# Patient Record
Sex: Female | Born: 1990 | Race: White | Hispanic: No | Marital: Single
Health system: Midwestern US, Academic
[De-identification: ages and names within clinical notes are randomized; demographics above are authoritative.]

## PROBLEM LIST (undated history)

## (undated) DIAGNOSIS — N83209 Unspecified ovarian cyst, unspecified side: Secondary | ICD-10-CM

## (undated) DIAGNOSIS — T4145XA Adverse effect of unspecified anesthetic, initial encounter: Secondary | ICD-10-CM

## (undated) DIAGNOSIS — R251 Tremor, unspecified: Secondary | ICD-10-CM

## (undated) DIAGNOSIS — Z8709 Personal history of other diseases of the respiratory system: Secondary | ICD-10-CM

## (undated) DIAGNOSIS — R200 Anesthesia of skin: Secondary | ICD-10-CM

## (undated) DIAGNOSIS — E8842 MERRF syndrome: Secondary | ICD-10-CM

## (undated) DIAGNOSIS — R569 Unspecified convulsions: Secondary | ICD-10-CM

## (undated) DIAGNOSIS — S060XAA Concussion with loss of consciousness status unknown, initial encounter: Secondary | ICD-10-CM

## (undated) DIAGNOSIS — S43006A Unspecified dislocation of unspecified shoulder joint, initial encounter: Secondary | ICD-10-CM

## (undated) DIAGNOSIS — K5900 Constipation, unspecified: Secondary | ICD-10-CM

## (undated) DIAGNOSIS — F4310 Post-traumatic stress disorder, unspecified: Secondary | ICD-10-CM

## (undated) DIAGNOSIS — Z8679 Personal history of other diseases of the circulatory system: Secondary | ICD-10-CM

## (undated) DIAGNOSIS — Z974 Presence of external hearing-aid: Secondary | ICD-10-CM

## (undated) DIAGNOSIS — R279 Unspecified lack of coordination: Secondary | ICD-10-CM

## (undated) DIAGNOSIS — I499 Cardiac arrhythmia, unspecified: Secondary | ICD-10-CM

## (undated) DIAGNOSIS — G930 Cerebral cysts: Secondary | ICD-10-CM

## (undated) DIAGNOSIS — S60822A Blister (nonthermal) of left wrist, initial encounter: Secondary | ICD-10-CM

## (undated) DIAGNOSIS — G40309 Generalized idiopathic epilepsy and epileptic syndromes, not intractable, without status epilepticus: Secondary | ICD-10-CM

## (undated) DIAGNOSIS — S060X9A Concussion with loss of consciousness of unspecified duration, initial encounter: Secondary | ICD-10-CM

## (undated) DIAGNOSIS — Z9889 Other specified postprocedural states: Secondary | ICD-10-CM

## (undated) DIAGNOSIS — L709 Acne, unspecified: Secondary | ICD-10-CM

## (undated) DIAGNOSIS — S43002A Unspecified subluxation of left shoulder joint, initial encounter: Secondary | ICD-10-CM

## (undated) DIAGNOSIS — D649 Anemia, unspecified: Secondary | ICD-10-CM

## (undated) DIAGNOSIS — R270 Ataxia, unspecified: Secondary | ICD-10-CM

## (undated) DIAGNOSIS — Z789 Other specified health status: Secondary | ICD-10-CM

## (undated) DIAGNOSIS — F509 Eating disorder, unspecified: Secondary | ICD-10-CM

## (undated) DIAGNOSIS — K8020 Calculus of gallbladder without cholecystitis without obstruction: Secondary | ICD-10-CM

## (undated) DIAGNOSIS — J45909 Unspecified asthma, uncomplicated: Secondary | ICD-10-CM

## (undated) DIAGNOSIS — Z8489 Family history of other specified conditions: Secondary | ICD-10-CM

## (undated) DIAGNOSIS — J189 Pneumonia, unspecified organism: Secondary | ICD-10-CM

## (undated) DIAGNOSIS — Z87442 Personal history of urinary calculi: Secondary | ICD-10-CM

## (undated) DIAGNOSIS — Z8669 Personal history of other diseases of the nervous system and sense organs: Secondary | ICD-10-CM

## (undated) DIAGNOSIS — F419 Anxiety disorder, unspecified: Secondary | ICD-10-CM

## (undated) DIAGNOSIS — G5600 Carpal tunnel syndrome, unspecified upper limb: Secondary | ICD-10-CM

## (undated) DIAGNOSIS — T8859XA Other complications of anesthesia, initial encounter: Secondary | ICD-10-CM

## (undated) DIAGNOSIS — M2550 Pain in unspecified joint: Secondary | ICD-10-CM

## (undated) DIAGNOSIS — G43909 Migraine, unspecified, not intractable, without status migrainosus: Secondary | ICD-10-CM

## (undated) DIAGNOSIS — G40209 Localization-related (focal) (partial) symptomatic epilepsy and epileptic syndromes with complex partial seizures, not intractable, without status epilepticus: Secondary | ICD-10-CM

## (undated) DIAGNOSIS — M1990 Unspecified osteoarthritis, unspecified site: Secondary | ICD-10-CM

## (undated) DIAGNOSIS — R112 Nausea with vomiting, unspecified: Secondary | ICD-10-CM

## (undated) DIAGNOSIS — Z973 Presence of spectacles and contact lenses: Secondary | ICD-10-CM

## (undated) DIAGNOSIS — R011 Cardiac murmur, unspecified: Secondary | ICD-10-CM

## (undated) DIAGNOSIS — K219 Gastro-esophageal reflux disease without esophagitis: Secondary | ICD-10-CM

## (undated) DIAGNOSIS — R55 Syncope and collapse: Secondary | ICD-10-CM

## (undated) HISTORY — PX: ABDOMINAL HYSTERECTOMY: SHX81

## (undated) HISTORY — PX: OTHER SURGICAL HISTORY: SHX169

## (undated) HISTORY — DX: Unspecified convulsions: R56.9

## (undated) HISTORY — DX: Anxiety disorder, unspecified: F41.9

## (undated) HISTORY — PX: REPAIR ANKLE LIGAMENT: SUR1187

## (undated) HISTORY — PX: MASTECTOMY: SHX3

## (undated) HISTORY — PX: APPENDECTOMY: SHX54

---
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
## 2009-04-22 ENCOUNTER — Emergency Department (HOSPITAL_BASED_OUTPATIENT_CLINIC_OR_DEPARTMENT_OTHER): Admission: EM | Admit: 2009-04-22 | Discharge: 2009-04-22 | Payer: Self-pay | Admitting: Emergency Medicine

## 2009-08-15 ENCOUNTER — Emergency Department: Payer: Self-pay | Admitting: Emergency Medicine

## 2009-08-16 ENCOUNTER — Telehealth: Payer: Self-pay | Admitting: Internal Medicine

## 2009-08-16 DIAGNOSIS — G40909 Epilepsy, unspecified, not intractable, without status epilepticus: Secondary | ICD-10-CM | POA: Insufficient documentation

## 2009-08-17 ENCOUNTER — Encounter: Payer: Self-pay | Admitting: Internal Medicine

## 2009-08-17 ENCOUNTER — Ambulatory Visit (HOSPITAL_COMMUNITY): Admission: RE | Admit: 2009-08-17 | Discharge: 2009-08-17 | Payer: Self-pay | Admitting: Internal Medicine

## 2009-08-23 ENCOUNTER — Telehealth: Payer: Self-pay | Admitting: Internal Medicine

## 2009-08-27 ENCOUNTER — Encounter: Payer: Self-pay | Admitting: Internal Medicine

## 2009-08-31 ENCOUNTER — Encounter: Payer: Self-pay | Admitting: Internal Medicine

## 2009-09-09 ENCOUNTER — Encounter: Payer: Self-pay | Admitting: Internal Medicine

## 2009-09-11 ENCOUNTER — Emergency Department: Payer: Self-pay | Admitting: Emergency Medicine

## 2009-09-14 ENCOUNTER — Ambulatory Visit: Payer: Self-pay | Admitting: Internal Medicine

## 2009-09-17 LAB — CONVERTED CEMR LAB
Albumin: 4.4 g/dL (ref 3.5–5.2)
Alkaline Phosphatase: 60 units/L (ref 39–117)
BUN: 12 mg/dL (ref 6–23)
Basophils Absolute: 0 10*3/uL (ref 0.0–0.1)
CRP, High Sensitivity: 0 (ref 0.00–5.00)
Creatinine, Ser: 0.7 mg/dL (ref 0.4–1.2)
GFR calc non Af Amer: 114.18 mL/min (ref >60)
Glucose, Bld: 87 mg/dL (ref 70–99)
HCT: 38.5 % (ref 36.0–46.0)
Hemoglobin: 13.3 g/dL (ref 12.0–15.0)
Lymphocytes Relative: 23.6 % (ref 12.0–46.0)
MCHC: 34.4 g/dL (ref 30.0–36.0)
MCV: 86.1 fL (ref 78.0–100.0)
Monocytes Relative: 7.1 % (ref 3.0–12.0)
Platelets: 306 10*3/uL (ref 150.0–400.0)
RBC: 4.47 M/uL (ref 3.87–5.11)
TSH: 1.16 microintl units/mL (ref 0.35–5.50)
Total Protein: 7.1 g/dL (ref 6.0–8.3)

## 2009-09-20 ENCOUNTER — Encounter: Payer: Self-pay | Admitting: Internal Medicine

## 2009-09-20 ENCOUNTER — Encounter (INDEPENDENT_AMBULATORY_CARE_PROVIDER_SITE_OTHER): Payer: Self-pay | Admitting: *Deleted

## 2009-09-21 ENCOUNTER — Telehealth: Payer: Self-pay | Admitting: Internal Medicine

## 2009-10-11 ENCOUNTER — Encounter (INDEPENDENT_AMBULATORY_CARE_PROVIDER_SITE_OTHER): Payer: Self-pay | Admitting: *Deleted

## 2009-11-27 ENCOUNTER — Encounter: Payer: Self-pay | Admitting: Internal Medicine

## 2010-01-15 HISTORY — PX: OVARIAN CYST REMOVAL: SHX89

## 2010-05-28 ENCOUNTER — Ambulatory Visit: Payer: Self-pay | Admitting: Internal Medicine

## 2010-10-13 ENCOUNTER — Emergency Department: Payer: Self-pay | Admitting: Emergency Medicine

## 2010-10-17 ENCOUNTER — Encounter: Payer: Self-pay | Admitting: Obstetrics and Gynecology

## 2010-10-18 ENCOUNTER — Emergency Department (HOSPITAL_COMMUNITY): Admission: EM | Admit: 2010-10-18 | Discharge: 2010-10-18 | Payer: Self-pay | Admitting: Emergency Medicine

## 2010-12-26 ENCOUNTER — Encounter: Payer: Self-pay | Admitting: Internal Medicine

## 2011-01-16 NOTE — Letter (Signed)
Summary: Medical Examination for [facility]  Medical Examination for [facility]   Imported By: Maryln Gottron 05/29/2010 12:51:11  _____________________________________________________________________  External Attachment:    Type:   Image     Comment:   External Document

## 2011-01-16 NOTE — Assessment & Plan Note (Signed)
Summary: cpx, has form/et   Vital Signs:  Patient profile:   20 year old female Menstrual status:  regular LMP:     05/25/2010 Height:      63 inches Weight:      118 pounds BMI:     20.98 Pulse rate:   60 / minute Pulse rhythm:   regular Resp:     12 per minute BP sitting:   108 / 76  (left arm) Cuff size:   regular  Vitals Entered By: Gladis Riffle, RN (May 28, 2010 7:53 AM) CC: cpx. has form Is Patient Diabetic? No LMP (date): 05/25/2010     Menstrual Status regular Enter LMP: 05/25/2010   CC:  cpx. has form.  History of Present Illness: CPX  diarrhea since returning from [place] no abdominal pain  Preventive Screening-Counseling & Management  Alcohol-Tobacco     Smoking Status: never  Current Problems (verified): 1)  Seizure Disorder  (ICD-780.39) 2)  Preventive Health Care  (ICD-V70.0) 3)  Hearing Loss, Bilateral  (ICD-389.9)  Current Medications (verified): 1)  Tegretol 200 Mg Tabs (Carbamazepine) .... 1/2 By Mouth Once Daily 2)  Acetaminophen 500 Mg  Caps (Acetaminophen) 3)  Advil 200 Mg Tabs (Ibuprofen) 4)  D3-50 50000 Unit Caps (Cholecalciferol) .... One Capsule By Mouth Every Week  Allergies: 1)  ! 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 [employer] Never Smoked Alcohol use-no Regular exercise-no long term relationship with female  partner   Impression & Recommendations:  Problem # 1:  PREVENTIVE HEALTH CARE (ICD-V70.0) forms completed health maint utd---does not need PAP may need pelvic exam sometime in mext 5 years  Problem # 2:  HEARING LOSS, BILATERAL (ICD-389.9) chronic no eval necessary  Problem # 3:  DIARRHEA (ICD-787.91)  4 weeks duration travel to [place]  Orders: T-Culture, Stool ([id number]) T-Culture, C-Diff Toxin A/B (09811-91478) T-Stool Giardia / Crypto- EIA (29562) T-Stool for O&P (13086-57846)  Complete Medication List: 1)  Tegretol 200 Mg Tabs (Carbamazepine) .... 1/2 by mouth once daily 2)  Acetaminophen 500 Mg Caps (Acetaminophen) 3)  Advil 200 Mg Tabs (Ibuprofen) 4)  D3-50 50000 Unit Caps (Cholecalciferol) .... One capsule by mouth every week  Contraindications/Deferment of Procedures/Staging:    Test/Procedure: PAP Smear    Reason for deferment: not indicated     Immunization History:  Tetanus/Td Immunization History:    Tetanus/Td:  boostrix (11/14/2009)  Physical Exam General Appearance: well developed, well nourished, no acute distress Eyes: conjunctiva and lids normal, PERRLA, EOMI,  Ears, Nose, Mouth, Throat: TM clear, nares clear, oral exam WNL Neck: supple, no lymphadenopathy, no thyromegaly, no JVD Respiratory: clear to auscultation and percussion, respiratory effort normal Cardiovascular: regular rate and rhythm, S1-S2, no murmur, rub or gallop, no bruits, peripheral pulses normal and symmetric, no cyanosis, clubbing, edema or varicosities Chest: no scars, masses, tenderness; no asymmetry,  Gastrointestinal: soft, non-tender; no hepatosplenomegaly, masses; active bowel sounds all quadrants,  no masses, tenderness,  Lymphatic: no cervical, axillary or inguinal adenopathy Musculoskeletal: gait normal, muscle tone and strength WNL,  no joint swelling, effusions, discoloration, crepitus  Skin: clear, good turgor, color WNL, no rashes, lesions, or  ulcerations Neurologic: normal mental status, normal reflexes, normal strength, sensation, and motion Psychiatric: alert; oriented to person, place and time Other Exam:      Immunization History:  Tetanus/Td Immunization History:    Tetanus/Td:  boostrix (11/14/2009)

## 2011-01-16 NOTE — Letter (Signed)
Summary: [facility]  [facility]   Imported By: Maryln Gottron 01/01/2011 09:28:08  _____________________________________________________________________  External Attachment:    Type:   Image     Comment:   External Document

## 2011-01-16 NOTE — Miscellaneous (Signed)
Summary: [employer]   Imported By: Maryln Gottron 05/30/2010 09:32:27  _____________________________________________________________________  External Attachment:    Type:   Image     Comment:   External Document

## 2011-02-25 LAB — URINALYSIS, ROUTINE W REFLEX MICROSCOPIC
Hgb urine dipstick: NEGATIVE
Ketones, ur: NEGATIVE mg/dL
Nitrite: NEGATIVE
Protein, ur: NEGATIVE mg/dL
Specific Gravity, Urine: 1.025 (ref 1.005–1.030)
Urobilinogen, UA: 0.2 mg/dL (ref 0.0–1.0)
pH: 6 (ref 5.0–8.0)

## 2011-02-25 LAB — WET PREP, GENITAL
Clue Cells Wet Prep HPF POC: NONE SEEN
Trich, Wet Prep: NONE SEEN
Yeast Wet Prep HPF POC: NONE SEEN

## 2011-02-25 LAB — CBC
MCHC: 34 g/dL (ref 30.0–36.0)
Platelets: 283 10*3/uL (ref 150–400)
RBC: 4.1 MIL/uL (ref 3.87–5.11)

## 2011-02-25 LAB — DIFFERENTIAL
Basophils Absolute: 0 10*3/uL (ref 0.0–0.1)
Basophils Relative: 0 % (ref 0–1)
Eosinophils Absolute: 0.4 10*3/uL (ref 0.0–0.7)
Lymphocytes Relative: 47 % — ABNORMAL HIGH (ref 12–46)

## 2011-02-25 LAB — POCT PREGNANCY, URINE: Preg Test, Ur: NEGATIVE

## 2011-04-04 ENCOUNTER — Ambulatory Visit: Payer: BC Managed Care – PPO | Admitting: Physical Therapy

## 2011-04-16 ENCOUNTER — Observation Stay: Payer: Self-pay | Admitting: Internal Medicine

## 2011-04-25 ENCOUNTER — Ambulatory Visit: Payer: BC Managed Care – PPO | Admitting: Internal Medicine

## 2011-04-25 ENCOUNTER — Encounter: Payer: BC Managed Care – PPO | Admitting: Rehabilitative and Restorative Service Providers"

## 2011-05-07 ENCOUNTER — Ambulatory Visit: Payer: BC Managed Care – PPO | Admitting: Internal Medicine

## 2011-05-07 ENCOUNTER — Encounter: Payer: Self-pay | Admitting: Internal Medicine

## 2011-05-08 ENCOUNTER — Ambulatory Visit (INDEPENDENT_AMBULATORY_CARE_PROVIDER_SITE_OTHER): Payer: BC Managed Care – PPO | Admitting: Internal Medicine

## 2011-05-08 ENCOUNTER — Encounter: Payer: Self-pay | Admitting: Internal Medicine

## 2011-05-08 VITALS — BP 94/62 | HR 64 | Temp 98.7°F | Wt 104.0 lb

## 2011-05-08 DIAGNOSIS — R569 Unspecified convulsions: Secondary | ICD-10-CM

## 2011-05-08 NOTE — Progress Notes (Signed)
  Subjective:    Patient ID: [name], female    DOB: 1991/11/27, 20 y.o.   MRN: [id number]  HPI  Hospitalized for seizure--low tegretol level---dose was increased. She has a neurologist--dr. Dohmeier.  Pt currently feels well, no complaints.   Past Medical History  Diagnosis Date  . Seizure disorder 2010  . Hearing loss    No past surgical history on file.  reports that she has never smoked. She does not have any smokeless tobacco history on file. She reports that she does not drink alcohol. Her drug history not on file. family history includes Polymyalgia rheumatica in her mother. Allergies  Allergen Reactions  . Cefuroxime Axetil     REACTION: rash, throat swells shut     Review of Systems  patient denies chest pain, shortness of breath, orthopnea. Denies lower extremity edema, abdominal pain, change in appetite, change in bowel movements. Patient denies rashes, musculoskeletal complaints. No other specific complaints in a complete review of systems.      Objective:   Physical Exam  Well-developed well-nourished female in no acute distress. HEENT exam atraumatic, normocephalic, extraocular muscles are intact. Neck is supple. No jugular venous distention no thyromegaly. Chest clear to auscultation without increased work of breathing. Cardiac exam S1 and S2 are regular. Abdominal exam active bowel sounds, soft, nontender. Extremities no edema. Neurologic exam she is alert without any motor sensory deficits. Gait is normal.        Assessment & Plan:

## 2011-05-08 NOTE — Assessment & Plan Note (Signed)
Recurrence with recent change in meds---no recurrence since then.  There has been some concern about low blood sugars---reasonable to check GGT

## 2011-05-09 LAB — CARBAMAZEPINE LEVEL, TOTAL: Carbamazepine Lvl: 4.2 ug/mL (ref 4.0–12.0)

## 2011-10-10 DIAGNOSIS — M25312 Other instability, left shoulder: Secondary | ICD-10-CM | POA: Insufficient documentation

## 2011-10-10 DIAGNOSIS — G5690 Unspecified mononeuropathy of unspecified upper limb: Secondary | ICD-10-CM | POA: Insufficient documentation

## 2011-11-25 ENCOUNTER — Encounter (HOSPITAL_COMMUNITY): Payer: Self-pay | Admitting: Emergency Medicine

## 2011-11-25 ENCOUNTER — Inpatient Hospital Stay (HOSPITAL_COMMUNITY)
Admission: EM | Admit: 2011-11-25 | Discharge: 2011-11-27 | DRG: 883 | Disposition: A | Discharge status: Home / Self Care | Payer: BC Managed Care – PPO | Source: Ambulatory Visit | Attending: Surgery | Admitting: Surgery

## 2011-11-25 DIAGNOSIS — G40109 Localization-related (focal) (partial) symptomatic epilepsy and epileptic syndromes with simple partial seizures, not intractable, without status epilepticus: Secondary | ICD-10-CM | POA: Diagnosis present

## 2011-11-25 DIAGNOSIS — M6281 Muscle weakness (generalized): Secondary | ICD-10-CM | POA: Diagnosis present

## 2011-11-25 DIAGNOSIS — R209 Unspecified disturbances of skin sensation: Secondary | ICD-10-CM | POA: Diagnosis not present

## 2011-11-25 DIAGNOSIS — K37 Unspecified appendicitis: Secondary | ICD-10-CM

## 2011-11-25 DIAGNOSIS — K353 Acute appendicitis with localized peritonitis, without perforation or gangrene: Secondary | ICD-10-CM | POA: Diagnosis present

## 2011-11-25 DIAGNOSIS — G40909 Epilepsy, unspecified, not intractable, without status epilepticus: Secondary | ICD-10-CM | POA: Diagnosis present

## 2011-11-25 DIAGNOSIS — H9190 Unspecified hearing loss, unspecified ear: Secondary | ICD-10-CM | POA: Diagnosis present

## 2011-11-25 DIAGNOSIS — R079 Chest pain, unspecified: Secondary | ICD-10-CM | POA: Diagnosis not present

## 2011-11-25 DIAGNOSIS — K3533 Acute appendicitis with perforation and localized peritonitis, with abscess: Principal | ICD-10-CM | POA: Diagnosis present

## 2011-11-25 DIAGNOSIS — J45909 Unspecified asthma, uncomplicated: Secondary | ICD-10-CM

## 2011-11-25 HISTORY — DX: Unspecified subluxation of left shoulder joint, initial encounter: S43.002A

## 2011-11-25 LAB — CBC
HCT: 36.8 % (ref 36.0–46.0)
Hemoglobin: 12.9 g/dL (ref 12.0–15.0)
MCH: 31 pg (ref 26.0–34.0)
MCHC: 35.1 g/dL (ref 30.0–36.0)
MCV: 88.5 fL (ref 78.0–100.0)
Platelets: 239 10*3/uL (ref 150–400)
WBC: 10.1 10*3/uL (ref 4.0–10.5)

## 2011-11-25 LAB — URINALYSIS, ROUTINE W REFLEX MICROSCOPIC
Bilirubin Urine: NEGATIVE
Nitrite: NEGATIVE
Urobilinogen, UA: 0.2 mg/dL (ref 0.0–1.0)

## 2011-11-25 LAB — DIFFERENTIAL
Eosinophils Relative: 2 % (ref 0–5)
Lymphs Abs: 0.9 10*3/uL (ref 0.7–4.0)
Monocytes Absolute: 0.5 10*3/uL (ref 0.1–1.0)
Neutro Abs: 8.5 10*3/uL — ABNORMAL HIGH (ref 1.7–7.7)

## 2011-11-25 LAB — POCT I-STAT, CHEM 8
Glucose, Bld: 95 mg/dL (ref 70–99)
TCO2: 30 mmol/L (ref 0–100)

## 2011-11-25 MED ORDER — SODIUM CHLORIDE 0.9 % IV SOLN
Freq: Once | INTRAVENOUS | Status: AC
  Administered 2011-11-25: 23:00:00 via INTRAVENOUS

## 2011-11-25 MED ORDER — HYDROMORPHONE HCL PF 1 MG/ML IJ SOLN
1.0000 mg | Freq: Once | INTRAMUSCULAR | Status: AC
  Administered 2011-11-25: 1 mg via INTRAVENOUS
  Filled 2011-11-25: qty 1

## 2011-11-25 MED ORDER — ONDANSETRON HCL 4 MG/2ML IJ SOLN
4.0000 mg | Freq: Once | INTRAMUSCULAR | Status: AC
  Administered 2011-11-25: 4 mg via INTRAVENOUS
  Filled 2011-11-25: qty 2

## 2011-11-25 NOTE — ED Provider Notes (Addendum)
History     CSN: [id number] Arrival date & time: 11/25/2011  9:36 PM   First MD Initiated Contact with Patient 11/25/11 2138      Chief Complaint  Patient presents with  . Abdominal Pain    (Consider location/radiation/quality/duration/timing/severity/associated sxs/prior treatment) HPI Comments: This gentleman has had 4-5 days of abdominal pain that started diffuse.  After 1 day.  Pain, resolved, but then on day 3 gradually became worse today.  She noticed increased sharp pain in the right lower quadrant.  Some nausea without vomiting.  No change in bowel habits, loss of appetite.  Denies dysuria, does not get menstrual cycles.   Patient is a 20 y.o. female presenting with abdominal pain. The history is provided by the patient.  Abdominal Pain The primary symptoms of the illness include abdominal pain and nausea. The primary symptoms of the illness do not include vomiting, diarrhea, dysuria, vaginal discharge or vaginal bleeding. The current episode started more than 2 days ago. The onset of the illness was gradual. The problem has been gradually worsening.  The abdominal pain does not radiate. The severity of the abdominal pain is 9/10. The abdominal pain is relieved by nothing. The abdominal pain is exacerbated by certain positions.  The patient states that she believes she is currently not pregnant. The patient has not had a change in bowel habit. Symptoms associated with the illness do not include chills, constipation, urgency, frequency or back pain.    Past Medical History  Diagnosis Date  . Seizure disorder 2010  . Hearing loss     History reviewed. No pertinent past surgical history.  Family History  Problem Relation Age of Onset  . Polymyalgia rheumatica Mother     History  Substance Use Topics  . Smoking status: Never Smoker   . Smokeless tobacco: Not on file  . Alcohol Use: No    OB History    Grav Para Term Preterm Abortions TAB SAB Ect Mult Living          Review of Systems  Constitutional: Negative for chills.  Eyes: Negative.   Respiratory: Negative.   Cardiovascular: Negative.   Gastrointestinal: Positive for nausea and abdominal pain. Negative for vomiting, diarrhea, constipation and abdominal distention.  Genitourinary: Negative for dysuria, urgency, frequency, vaginal bleeding, vaginal discharge and vaginal pain.  Musculoskeletal: Negative for back pain.  Skin: Positive for pallor.  Hematological: Negative.   Psychiatric/Behavioral: Negative.     Allergies  Cefuroxime axetil  Home Medications   Current Outpatient Rx  Name Route Sig Dispense Refill  . CARBAMAZEPINE 200 MG PO TABS Oral Take 600 mg by mouth daily.     . ERGOCALCIFEROL 50000 UNITS PO CAPS Oral Take 50,000 Units by mouth once a week.      Marland Kitchen LAMICTAL XR 200 MG PO TB24 Oral Take 1 tablet by mouth Twice daily.    Marland Kitchen ZALEPLON 5 MG PO CAPS Oral Take 1 capsule by mouth daily.      BP 98/53  Pulse 100  Temp(Src) 101 F (38.3 C) (Oral)  Resp 16  SpO2 95%  Physical Exam  Constitutional: She is oriented to person, place, and time. She appears well-developed and well-nourished.  HENT:  Head: Normocephalic.  Eyes: Pupils are equal, round, and reactive to light.  Neck: Normal range of motion.  Cardiovascular: Normal rate.   Pulmonary/Chest: Effort normal.  Abdominal: She exhibits no distension. There is tenderness in the right lower quadrant. There is tenderness at McBurney's point. There is no  rebound, no guarding and negative Murphy's sign.  Musculoskeletal: Normal range of motion.  Neurological: She is oriented to person, place, and time.  Skin: Skin is warm. There is pallor.  Psychiatric: She has a normal mood and affect.    ED Course  Procedures (including critical care time)  Labs Reviewed  DIFFERENTIAL - Abnormal; Notable for the following:    Neutrophils Relative 84 (*)    Neutro Abs 8.5 (*)    Lymphocytes Relative 9 (*)    All other  components within normal limits  URINALYSIS, ROUTINE W REFLEX MICROSCOPIC - Abnormal; Notable for the following:    pH 8.5 (*)    All other components within normal limits  CBC  POCT I-STAT, CHEM 8  I-STAT, CHEM 8   Ct Abdomen Pelvis W Contrast  11/26/2011  *RADIOLOGY REPORT*  Clinical Data: Right lower quadrant pain and nausea and vomiting. Seizures.  CT ABDOMEN AND PELVIS WITH CONTRAST  Technique:  Multidetector CT imaging of the abdomen and pelvis was performed following the standard protocol during bolus administration of intravenous contrast.  Contrast: 80mL OMNIPAQUE IOHEXOL 300 MG/ML IV SOLN  Comparison: Represent 10/18/2010  Findings: The lung bases are clear.  The liver, spleen, gallbladder, pancreas, adrenal glands, kidneys, stomach, abdominal aorta, and retroperitoneal lymph nodes are unremarkable.  The small bowel are not distended.  Stool filled colon without distension or wall thickening.  Pelvis:  Retrocecal appendix is distended, with diameter of about 9 mm.  There is periappendiceal stranding and inflammation.  Changes are consistent with appendicitis.  No periappendiceal abscess. Small amount of free fluid in the pelvis may be physiologic or reactive.  Uterus and adnexal structures are not significantly enlarged.  The bladder wall is not thickened.  IMPRESSION: Mild distension of the appendix with periappendiceal inflammation consistent with acute appendicitis.  No appendiceal abscess.  Note the retrocecal location of the appendix.  Original Report Authenticated By: Marlon Pel, M.D.     1. Appendicitis     Patient and family informed of CT results positive for appendicitis Spoke with Dr. Andrey Campanile who will come evaluate patient for surgery MDM  After examining this patient listening to her, history we'll check urine to rule out UTI.  Does not have flank pain or CVA tenderness to doubt this is kidney stone.  She does have a history of one ovarian cyst in the past, but per  patient.  This feels very different.  Will CT scan to rule out acute appendicitis.  She, has her service dog with her who alerts her to potential seizures.  Will allow dog to travel with her, through the emergency department, and x-ray        Arman Filter, NP 11/25/11 2233  Arman Filter, NP 11/26/11 1610  Arman Filter, NP 11/26/11 [phone]

## 2011-11-25 NOTE — ED Notes (Signed)
Pt c/o RLQ pain onset 2-3 days ago off and on st's tonight while walking had sudden onset of more severe pain in RLQ.  Denies nausea or vomiting at this time.  Pt denies urinary symptoms

## 2011-11-25 NOTE — ED Notes (Signed)
Pt resting at this time. Pt's mother at bedside

## 2011-11-25 NOTE — ED Notes (Signed)
Pt took her seizure meds that were due after G. Veatrice Kells NP made aware and agreed.

## 2011-11-26 ENCOUNTER — Encounter (HOSPITAL_COMMUNITY): Payer: Self-pay | Admitting: Certified Registered"

## 2011-11-26 ENCOUNTER — Emergency Department (HOSPITAL_COMMUNITY): Payer: BC Managed Care – PPO

## 2011-11-26 ENCOUNTER — Other Ambulatory Visit (INDEPENDENT_AMBULATORY_CARE_PROVIDER_SITE_OTHER): Payer: Self-pay | Admitting: General Surgery

## 2011-11-26 ENCOUNTER — Encounter (HOSPITAL_COMMUNITY): Admission: EM | Disposition: A | Discharge status: Home / Self Care | Payer: Self-pay | Source: Ambulatory Visit

## 2011-11-26 ENCOUNTER — Encounter (HOSPITAL_COMMUNITY): Payer: Self-pay | Admitting: General Surgery

## 2011-11-26 ENCOUNTER — Emergency Department (HOSPITAL_COMMUNITY): Payer: BC Managed Care – PPO | Admitting: Certified Registered"

## 2011-11-26 DIAGNOSIS — M6281 Muscle weakness (generalized): Secondary | ICD-10-CM | POA: Diagnosis present

## 2011-11-26 DIAGNOSIS — R1011 Right upper quadrant pain: Secondary | ICD-10-CM

## 2011-11-26 DIAGNOSIS — K353 Acute appendicitis with localized peritonitis, without perforation or gangrene: Secondary | ICD-10-CM | POA: Diagnosis present

## 2011-11-26 DIAGNOSIS — R110 Nausea: Secondary | ICD-10-CM

## 2011-11-26 DIAGNOSIS — K3580 Unspecified acute appendicitis: Secondary | ICD-10-CM

## 2011-11-26 HISTORY — PX: LAPAROSCOPIC APPENDECTOMY: SHX408

## 2011-11-26 SURGERY — APPENDECTOMY, LAPAROSCOPIC
Anesthesia: General | Site: Abdomen | Wound class: Dirty or Infected

## 2011-11-26 MED ORDER — HYDROMORPHONE HCL PF 1 MG/ML IJ SOLN
INTRAMUSCULAR | Status: AC
  Administered 2011-11-26: 1 mg via INTRAVENOUS
  Filled 2011-11-26: qty 1

## 2011-11-26 MED ORDER — MORPHINE SULFATE 2 MG/ML IJ SOLN
1.0000 mg | INTRAMUSCULAR | Status: DC | PRN
  Administered 2011-11-26: 2 mg via INTRAVENOUS
  Filled 2011-11-26: qty 1

## 2011-11-26 MED ORDER — ZOLPIDEM TARTRATE 5 MG PO TABS: 5.0000 mg | ORAL_TABLET | Freq: Every evening | ORAL | Status: DC | PRN

## 2011-11-26 MED ORDER — CIPROFLOXACIN IN D5W 400 MG/200ML IV SOLN
400.0000 mg | Freq: Two times a day (BID) | INTRAVENOUS | Status: DC
  Administered 2011-11-26 – 2011-11-27 (×3): 400 mg via INTRAVENOUS
  Filled 2011-11-26 (×4): qty 200

## 2011-11-26 MED ORDER — ONDANSETRON HCL 4 MG/2ML IJ SOLN
4.0000 mg | Freq: Once | INTRAMUSCULAR | Status: AC
  Administered 2011-11-26: 4 mg via INTRAVENOUS
  Filled 2011-11-26: qty 2

## 2011-11-26 MED ORDER — FENTANYL CITRATE 0.05 MG/ML IJ SOLN: 50.0000 ug | INTRAMUSCULAR | Status: DC | PRN

## 2011-11-26 MED ORDER — CARBAMAZEPINE 200 MG PO TABS
200.0000 mg | ORAL_TABLET | ORAL | Status: AC
  Administered 2011-11-26: 200 mg via ORAL
  Filled 2011-11-26: qty 1

## 2011-11-26 MED ORDER — ACETAMINOPHEN 10 MG/ML IV SOLN
INTRAVENOUS | Status: AC
  Filled 2011-11-26: qty 100

## 2011-11-26 MED ORDER — OXYCODONE HCL 5 MG PO TABS
5.0000 mg | ORAL_TABLET | ORAL | Status: DC | PRN
  Administered 2011-11-26 (×2): 10 mg via ORAL
  Filled 2011-11-26 (×3): qty 2

## 2011-11-26 MED ORDER — IOHEXOL 300 MG/ML  SOLN
80.0000 mL | Freq: Once | INTRAMUSCULAR | Status: AC | PRN
  Administered 2011-11-26: 80 mL via INTRAVENOUS

## 2011-11-26 MED ORDER — METRONIDAZOLE IN NACL 5-0.79 MG/ML-% IV SOLN
500.0000 mg | Freq: Three times a day (TID) | INTRAVENOUS | Status: DC
  Administered 2011-11-26 – 2011-11-27 (×4): 500 mg via INTRAVENOUS
  Filled 2011-11-26 (×7): qty 100

## 2011-11-26 MED ORDER — PROPOFOL 10 MG/ML IV EMUL
INTRAVENOUS | Status: DC | PRN
  Administered 2011-11-26: 140 mg via INTRAVENOUS

## 2011-11-26 MED ORDER — PROMETHAZINE HCL 25 MG/ML IJ SOLN: 6.2500 mg | INTRAMUSCULAR | Status: DC | PRN

## 2011-11-26 MED ORDER — ROCURONIUM BROMIDE 100 MG/10ML IV SOLN
INTRAVENOUS | Status: DC | PRN
  Administered 2011-11-26: 10 mg via INTRAVENOUS

## 2011-11-26 MED ORDER — DOCUSATE SODIUM 100 MG PO CAPS
100.0000 mg | ORAL_CAPSULE | Freq: Two times a day (BID) | ORAL | Status: DC
  Administered 2011-11-26 – 2011-11-27 (×3): 100 mg via ORAL
  Filled 2011-11-26 (×6): qty 1

## 2011-11-26 MED ORDER — SODIUM CHLORIDE 0.9 % IR SOLN
Status: DC | PRN
  Administered 2011-11-26: 1000 mL

## 2011-11-26 MED ORDER — LORAZEPAM 2 MG/ML IJ SOLN: 1.0000 mg | Freq: Once | INTRAMUSCULAR | Status: DC | PRN

## 2011-11-26 MED ORDER — MORPHINE SULFATE 2 MG/ML IJ SOLN
2.0000 mg | Freq: Once | INTRAMUSCULAR | Status: AC
  Administered 2011-11-26: 2 mg via INTRAVENOUS
  Filled 2011-11-26: qty 1

## 2011-11-26 MED ORDER — CIPROFLOXACIN IN D5W 400 MG/200ML IV SOLN
400.0000 mg | Freq: Once | INTRAVENOUS | Status: AC
  Administered 2011-11-26 (×2): 400 mg via INTRAVENOUS
  Filled 2011-11-26: qty 200

## 2011-11-26 MED ORDER — METRONIDAZOLE IN NACL 5-0.79 MG/ML-% IV SOLN
500.0000 mg | Freq: Once | INTRAVENOUS | Status: AC
  Administered 2011-11-26: 500 mg via INTRAVENOUS
  Filled 2011-11-26: qty 100

## 2011-11-26 MED ORDER — KCL IN DEXTROSE-NACL 20-5-0.45 MEQ/L-%-% IV SOLN
INTRAVENOUS | Status: DC
  Administered 2011-11-26: 09:00:00 via INTRAVENOUS
  Filled 2011-11-26 (×5): qty 1000

## 2011-11-26 MED ORDER — LACTATED RINGERS IV SOLN
INTRAVENOUS | Status: DC | PRN
  Administered 2011-11-26 (×2): via INTRAVENOUS

## 2011-11-26 MED ORDER — MIDAZOLAM HCL 2 MG/2ML IJ SOLN: 1.0000 mg | INTRAMUSCULAR | Status: DC | PRN

## 2011-11-26 MED ORDER — DEXAMETHASONE SODIUM PHOSPHATE 4 MG/ML IJ SOLN
INTRAMUSCULAR | Status: DC | PRN
  Administered 2011-11-26: 4 mg via INTRAVENOUS

## 2011-11-26 MED ORDER — LAMOTRIGINE 100 MG PO TABS
100.0000 mg | ORAL_TABLET | ORAL | Status: DC
  Filled 2011-11-26: qty 1

## 2011-11-26 MED ORDER — SUCCINYLCHOLINE CHLORIDE 20 MG/ML IJ SOLN
INTRAMUSCULAR | Status: DC | PRN
  Administered 2011-11-26: 100 mg via INTRAVENOUS

## 2011-11-26 MED ORDER — ONDANSETRON HCL 4 MG/2ML IJ SOLN
INTRAMUSCULAR | Status: DC | PRN
  Administered 2011-11-26: 4 mg via INTRAVENOUS

## 2011-11-26 MED ORDER — CARBAMAZEPINE 200 MG PO TABS
200.0000 mg | ORAL_TABLET | Freq: Three times a day (TID) | ORAL | Status: DC
  Administered 2011-11-26 – 2011-11-27 (×4): 200 mg via ORAL
  Filled 2011-11-26 (×9): qty 1

## 2011-11-26 MED ORDER — HEPARIN SODIUM (PORCINE) 5000 UNIT/ML IJ SOLN
5000.0000 [IU] | Freq: Three times a day (TID) | INTRAMUSCULAR | Status: DC
  Administered 2011-11-26 – 2011-11-27 (×2): 5000 [IU] via SUBCUTANEOUS
  Filled 2011-11-26 (×7): qty 1

## 2011-11-26 MED ORDER — IOHEXOL 300 MG/ML  SOLN: 20.0000 mL | INTRAMUSCULAR | Status: DC

## 2011-11-26 MED ORDER — HYDROMORPHONE HCL PF 1 MG/ML IJ SOLN
1.0000 mg | Freq: Once | INTRAMUSCULAR | Status: AC
  Administered 2011-11-26: 1 mg via INTRAVENOUS
  Filled 2011-11-26: qty 1

## 2011-11-26 MED ORDER — MIDAZOLAM HCL 5 MG/5ML IJ SOLN
INTRAMUSCULAR | Status: DC | PRN
  Administered 2011-11-26: 2 mg via INTRAVENOUS

## 2011-11-26 MED ORDER — LAMOTRIGINE ER 200 MG PO TB24
1.0000 | ORAL_TABLET | Freq: Two times a day (BID) | ORAL | Status: DC
  Administered 2011-11-26 – 2011-11-27 (×2): 200 mg via ORAL
  Filled 2011-11-26 (×2): qty 1

## 2011-11-26 MED ORDER — HYDROMORPHONE HCL PF 1 MG/ML IJ SOLN: 0.2500 mg | INTRAMUSCULAR | Status: DC | PRN

## 2011-11-26 MED ORDER — SUFENTANIL CITRATE 50 MCG/ML IV SOLN
INTRAVENOUS | Status: DC | PRN
  Administered 2011-11-26: 10 ug via INTRAVENOUS
  Administered 2011-11-26: 20 ug via INTRAVENOUS

## 2011-11-26 MED ORDER — HYDROMORPHONE HCL PF 1 MG/ML IJ SOLN
0.5000 mg | INTRAMUSCULAR | Status: DC | PRN
  Administered 2011-11-26 – 2011-11-27 (×7): 1 mg via INTRAVENOUS
  Filled 2011-11-26 (×6): qty 1

## 2011-11-26 MED ORDER — BUPIVACAINE-EPINEPHRINE 0.25% -1:200000 IJ SOLN
INTRAMUSCULAR | Status: DC | PRN
  Administered 2011-11-26: 18 mL

## 2011-11-26 MED ORDER — LAMOTRIGINE 200 MG PO TABS
200.0000 mg | ORAL_TABLET | ORAL | Status: AC
  Administered 2011-11-26: 100 mg via ORAL
  Filled 2011-11-26 (×2): qty 1

## 2011-11-26 MED ORDER — ACETAMINOPHEN 10 MG/ML IV SOLN
1000.0000 mg | Freq: Four times a day (QID) | INTRAVENOUS | Status: AC
  Administered 2011-11-26 – 2011-11-27 (×4): 1000 mg via INTRAVENOUS
  Filled 2011-11-26 (×6): qty 100

## 2011-11-26 MED ORDER — PROMETHAZINE HCL 25 MG/ML IJ SOLN
12.5000 mg | Freq: Four times a day (QID) | INTRAMUSCULAR | Status: DC | PRN
  Administered 2011-11-26: 23:00:00 via INTRAVENOUS
  Administered 2011-11-27: 12.5 mg via INTRAVENOUS
  Filled 2011-11-26 (×3): qty 1

## 2011-11-26 SURGICAL SUPPLY — 48 items
ADH SKN CLS APL DERMABOND .7 (GAUZE/BANDAGES/DRESSINGS) ×1
APL SKNCLS STERI-STRIP NONHPOA (GAUZE/BANDAGES/DRESSINGS) ×1
APPLIER CLIP ROT 10 11.4 M/L (STAPLE)
APR CLP MED LRG 11.4X10 (STAPLE)
BAG SPEC RTRVL LRG 6X4 10 (ENDOMECHANICALS) ×1
BENZOIN TINCTURE PRP APPL 2/3 (GAUZE/BANDAGES/DRESSINGS) ×2 IMPLANT
BLADE SURG ROTATE 9660 (MISCELLANEOUS) IMPLANT
CANISTER SUCTION 2500CC (MISCELLANEOUS) ×2 IMPLANT
CHLORAPREP W/TINT 26ML (MISCELLANEOUS) ×2 IMPLANT
CLIP APPLIE ROT 10 11.4 M/L (STAPLE) IMPLANT
CLOTH BEACON ORANGE TIMEOUT ST (SAFETY) ×2 IMPLANT
COVER SURGICAL LIGHT HANDLE (MISCELLANEOUS) ×2 IMPLANT
CUTTER FLEX LINEAR 45M (STAPLE) ×2 IMPLANT
DECANTER SPIKE VIAL GLASS SM (MISCELLANEOUS) ×2 IMPLANT
DERMABOND ADVANCED (GAUZE/BANDAGES/DRESSINGS) ×1
DERMABOND ADVANCED .7 DNX12 (GAUZE/BANDAGES/DRESSINGS) IMPLANT
DRAPE UTILITY 15X26 W/TAPE STR (DRAPE) ×4 IMPLANT
ELECT REM PT RETURN 9FT ADLT (ELECTROSURGICAL) ×2
ELECTRODE REM PT RTRN 9FT ADLT (ELECTROSURGICAL) ×1 IMPLANT
ENDOLOOP SUT PDS II  0 18 (SUTURE)
ENDOLOOP SUT PDS II 0 18 (SUTURE) IMPLANT
GLOVE BIO SURGEON STRL SZ7 (GLOVE) ×1 IMPLANT
GLOVE BIOGEL PI IND STRL 7.5 (GLOVE) IMPLANT
GLOVE BIOGEL PI INDICATOR 7.5 (GLOVE) ×1
GLOVE ECLIPSE 7.5 STRL STRAW (GLOVE) ×2 IMPLANT
GOWN STRL NON-REIN LRG LVL3 (GOWN DISPOSABLE) ×4 IMPLANT
GOWN STRL REIN XL XLG (GOWN DISPOSABLE) ×2 IMPLANT
KIT BASIN OR (CUSTOM PROCEDURE TRAY) ×2 IMPLANT
KIT ROOM TURNOVER OR (KITS) ×2 IMPLANT
NS IRRIG 1000ML POUR BTL (IV SOLUTION) ×2 IMPLANT
PAD ARMBOARD 7.5X6 YLW CONV (MISCELLANEOUS) ×4 IMPLANT
POUCH SPECIMEN RETRIEVAL 10MM (ENDOMECHANICALS) ×2 IMPLANT
RELOAD 45 VASCULAR/THIN (ENDOMECHANICALS) ×2 IMPLANT
RELOAD STAPLE 45 2.5 WHT GRN (ENDOMECHANICALS) IMPLANT
RELOAD STAPLE 45 3.5 BLU ETS (ENDOMECHANICALS) IMPLANT
RELOAD STAPLE TA45 3.5 REG BLU (ENDOMECHANICALS) IMPLANT
SCALPEL HARMONIC ACE (MISCELLANEOUS) ×2 IMPLANT
SCISSORS LAP 5X35 DISP (ENDOMECHANICALS) IMPLANT
SET IRRIG TUBING LAPAROSCOPIC (IRRIGATION / IRRIGATOR) ×2 IMPLANT
SPECIMEN JAR SMALL (MISCELLANEOUS) ×2 IMPLANT
SUT MNCRL AB 4-0 PS2 18 (SUTURE) ×2 IMPLANT
SUT VICRYL 0 UR6 27IN ABS (SUTURE) IMPLANT
TOWEL OR 17X24 6PK STRL BLUE (TOWEL DISPOSABLE) ×2 IMPLANT
TOWEL OR 17X26 10 PK STRL BLUE (TOWEL DISPOSABLE) ×2 IMPLANT
TRAY FOLEY CATH 14FR (SET/KITS/TRAYS/PACK) ×2 IMPLANT
TRAY LAPAROSCOPIC (CUSTOM PROCEDURE TRAY) ×2 IMPLANT
TROCAR XCEL BLADELESS 5X75MML (TROCAR) ×4 IMPLANT
TROCAR XCEL BLUNT TIP 100MML (ENDOMECHANICALS) ×2 IMPLANT

## 2011-11-26 NOTE — ED Notes (Signed)
Report given to Brenda RN

## 2011-11-26 NOTE — Brief Op Note (Addendum)
11/25/2011 - 11/26/2011  6:11 AM  PATIENT:  [name]  20 y.o. female  PRE-OPERATIVE DIAGNOSIS:  Acute appendicitis  POST-OPERATIVE DIAGNOSIS:  Acute suppurative appendicitis  PROCEDURE:  Procedure(s): APPENDECTOMY LAPAROSCOPIC  SURGEON:  Surgeon(s): Atilano Ina, MD  PHYSICIAN ASSISTANT:   ASSISTANTS: none   ANESTHESIA:   general  EBL:  Total I/O In: 1000 [I.V.:1000] Out: 1400 [Urine:1400]  BLOOD ADMINISTERED:none  DRAINS: none   LOCAL MEDICATIONS USED:  MARCAINE 0.25% with epi  18CC  SPECIMEN:  Source of Specimen:  appendix  DISPOSITION OF SPECIMEN:  PATHOLOGY  COUNTS:  YES  TOURNIQUET:  * No tourniquets in log *  DICTATION: .Other Dictation: Dictation Number [id number]  PLAN OF CARE: Admit for overnight observation  PATIENT DISPOSITION:  PACU - hemodynamically stable.   Delay start of Pharmacological VTE agent (>24hrs) due to surgical blood loss or risk of bleeding:  No  Mary Sella. Andrey Campanile, MD, FACS General, Bariatric, & Minimally Invasive Surgery [facility], [place]

## 2011-11-26 NOTE — ED Notes (Signed)
Pt to CT,. 

## 2011-11-26 NOTE — Progress Notes (Signed)
Day of Surgery  Subjective: Doing well post op.  Alert, wants to advance diet.  She has chronic LE weakness, and periods, where they don't work, She calls these episodes, "dumb."  She is currently noting those symptoms since waking from anesthesia.   Objective: Vital signs in last 24 hours: Temp:  [97.3 F (36.3 C)-101 F (38.3 C)] 98.3 F (36.8 C) (12/12 1320) Pulse Rate:  [60-100] 60  (12/12 1320) Resp:  [11-40] 18  (12/12 1320) BP: (97-179)/(48-74) 97/58 mmHg (12/12 1320) SpO2:  [94 %-100 %] 96 % (12/12 0807) Weight:  [45.36 kg (100 lb)] 100 lb (45.36 kg) (12/12 0440)    Intake/Output from previous day: 12/11 0701 - 12/12 0700 In: 1000 [I.V.:1000] Out: 1400 [Urine:1400] Intake/Output this shift: Total I/O In: 840 [P.O.:240; I.V.:600] Out: -   General appearance: alert, cooperative and no distress Resp: clear to auscultation bilaterally GI: soft, non-tender; bowel sounds normal; no masses,  no organomegaly and incisions look good.  Lab Results:   Basename 11/25/11 2256 11/25/11 2227  WBC -- 10.1  HGB 12.9 12.9  HCT 38.0 36.8  PLT -- 239   BMET  Basename 11/25/11 2256  NA 138  K 3.8  CL 97  CO2 --  GLUCOSE 95  BUN 8  CREATININE 0.80  CALCIUM --   PT/INR No results found for this basename: LABPROT:2,INR:2 in the last 72 hours ABG No results found for this basename: PHART:2,PCO2:2,PO2:2,HCO3:2 in the last 72 hours  Studies/Results: Ct Abdomen Pelvis W Contrast  11/26/2011  *RADIOLOGY REPORT*  Clinical Data: Right lower quadrant pain and nausea and vomiting. Seizures.  CT ABDOMEN AND PELVIS WITH CONTRAST  Technique:  Multidetector CT imaging of the abdomen and pelvis was performed following the standard protocol during bolus administration of intravenous contrast.  Contrast: 80mL OMNIPAQUE IOHEXOL 300 MG/ML IV SOLN  Comparison: Represent 10/18/2010  Findings: The lung bases are clear.  The liver, spleen, gallbladder, pancreas, adrenal glands, kidneys, stomach,  abdominal aorta, and retroperitoneal lymph nodes are unremarkable.  The small bowel are not distended.  Stool filled colon without distension or wall thickening.  Pelvis:  Retrocecal appendix is distended, with diameter of about 9 mm.  There is periappendiceal stranding and inflammation.  Changes are consistent with appendicitis.  No periappendiceal abscess. Small amount of free fluid in the pelvis may be physiologic or reactive.  Uterus and adnexal structures are not significantly enlarged.  The bladder wall is not thickened.  IMPRESSION: Mild distension of the appendix with periappendiceal inflammation consistent with acute appendicitis.  No appendiceal abscess.  Note the retrocecal location of the appendix.  Original Report Authenticated By: Marlon Pel, M.D.    Anti-infectives: Anti-infectives     Start     Dose/Rate Route Frequency Ordered Stop   11/26/11 1000   ciprofloxacin (CIPRO) IVPB 400 mg        400 mg 200 mL/hr over 60 Minutes Intravenous Every 12 hours 11/26/11 0758 11/28/11 0959   11/26/11 0900   metroNIDAZOLE (FLAGYL) IVPB 500 mg        500 mg 100 mL/hr over 60 Minutes Intravenous Every 8 hours 11/26/11 0758 11/28/11 0859   11/26/11 0415   ciprofloxacin (CIPRO) IVPB 400 mg        400 mg 200 mL/hr over 60 Minutes Intravenous  Once 11/26/11 0413 11/26/11 0505   11/26/11 0415   metroNIDAZOLE (FLAGYL) IVPB 500 mg        500 mg 100 mL/hr over 60 Minutes Intravenous  Once  11/26/11 0413 11/26/11 0525          Assessment/Plan: s/p Procedure(s): APPENDECTOMY LAPAROSCOPIC Seizures Bilateral lower extremity weakness,due to seizure disorder. Bilat Hearing loss. Plan:  Full liquids and advance as tolerated.  Will try to mobilize, but will follow her lead, and that of family.  Continue antibiotics, Dr. Andrey Campanile has continued her pre-admit meds. She has allot of trouble with oral analgesics. OK now on IV Tylenol.  Will see if she needs more in AM.  LOS: 1 day     Kennede Lusk 11/26/2011

## 2011-11-26 NOTE — Progress Notes (Signed)
Agree. Wilmon Arms. Corliss Skains, MD, [facility]  11/26/2011 6:07 PM

## 2011-11-26 NOTE — Progress Notes (Signed)
   CARE MANAGEMENT NOTE 11/26/2011  Patient:  [facility]   Account Number:  [id number]  Date Initiated:  11/26/2011  Documentation initiated by:  Carlyle Lipa  Subjective/Objective Assessment:   acute appendicitis; lap appendectomy     Action/Plan:   home when stable postop; no anticipated HH needs   Anticipated DC Date:  11/27/2011   Anticipated DC Plan:  HOME/SELF CARE      DC Planning Services  CM consult             Status of service:  In process, will continue to follow  Per UR Regulation:  Reviewed for med. necessity/level of care/duration of stay

## 2011-11-26 NOTE — Anesthesia Postprocedure Evaluation (Signed)
  Anesthesia Post-op Note  Patient: [name]  Procedure(s) Performed:  APPENDECTOMY LAPAROSCOPIC  Patient Location: PACU  Anesthesia Type: General  Level of Consciousness: awake and oriented  Airway and Oxygen Therapy: Patient Spontanous Breathing  Post-op Pain: mild  Post-op Assessment: Post-op Vital signs reviewed, Patient's Cardiovascular Status Stable, Respiratory Function Stable, Patent Airway, No signs of Nausea or vomiting and Pain level controlled  Post-op Vital Signs: stable  Complications: No apparent anesthesia complications

## 2011-11-26 NOTE — H&P (Signed)
Shiryl Ruddy is an 20 y.o. female.   Chief Complaint: stomach pain  HPI: 19 year old Caucasian female comes in to the emergency room complaining of right-sided abdominal pain. She states that the pain started around 5 days ago it was located on her right side in the lower abdomen. It went away after a brief time. It then returned 2 days ago and lasted for about half a day and then resolved. However the pain returned late yesterday evening. Since that time it has been constant, sharp, and severe. She has been nauseous as well as febrile. The pain is now in the right lower quadrant. When it started last evening it was slightly above that area. She developed a temperature in the emergency room. Other then this similar pain 5 days ago she has never had any pain like this before. While riding in the car to the emergency room any time they hit a bump it made her pain worse. She denies any chills or emesis. She had a bowel movement this morning. And she was able the breakfast yesterday morning. She has lost weight over the past several months but she attributes that to a chronic neurological condition and muscle weakness. She denies any melena, hematochezia, dysuria, heavy menstrual bleeding, sexual intercourse.  She has a history of seizures. They're mainly partial complex seizures with some tonic-clonic seizures. Her last partial complex seizure was last weekend. It generally consist of a blank stare for around a minute with drooling. She has to take her anti-seizure medicine on a strict time frame. She has developed muscular leg weakness in her lower extremities over the past several months. Her right leg and foot has actually become inwardly rotated. She is in the process of getting measured and fitted for leg braces. She has had several falls over the past several months as result of her leg weakness and decrease in sensation in her lower extremities. She is in the process of being evaluated at [facility].    Past Medical History  Diagnosis Date  . Seizure disorder 2010  . Hearing loss   . Muscle weakness of lower extremity     b/l  . Shoulder subluxation, left     History reviewed. No pertinent past surgical history.  Family History  Problem Relation Age of Onset  . Polymyalgia rheumatica Mother    Social History:  reports that she has never smoked. She does not have any smokeless tobacco history on file. She reports that she does not drink alcohol or use illicit drugs.  Allergies:  Allergies  Allergen Reactions  . Cefuroxime Axetil     REACTION: rash, throat swells shut    Medications Prior to Admission  Medication Dose Route Frequency Provider Last Rate Last Dose  . 0.9 %  sodium chloride infusion   Intravenous Once Arman Filter, NP 50 mL/hr at 11/25/11 2316    . ciprofloxacin (CIPRO) IVPB 400 mg  400 mg Intravenous Once Atilano Ina, MD   400 mg at 11/26/11 0429  . HYDROmorphone (DILAUDID) injection 1 mg  1 mg Intravenous Once Arman Filter, NP   1 mg at 11/25/11 2316  . HYDROmorphone (DILAUDID) injection 1 mg  1 mg Intravenous Once Arman Filter, NP   1 mg at 11/26/11 0144  . iohexol (OMNIPAQUE) 300 MG/ML solution 20 mL  20 mL Oral Q1 Hr x 2 Medication Radiologist      . iohexol (OMNIPAQUE) 300 MG/ML solution 80 mL  80 mL Intravenous Once PRN  Medication Radiologist   80 mL at 11/26/11 0229  . metroNIDAZOLE (FLAGYL) IVPB 500 mg  500 mg Intravenous Once Atilano Ina, MD      . morphine 2 MG/ML injection 2 mg  2 mg Intravenous Once Atilano Ina, MD   2 mg at 11/26/11 0426  . ondansetron (ZOFRAN) injection 4 mg  4 mg Intravenous Once Arman Filter, NP   4 mg at 11/25/11 2316  . ondansetron (ZOFRAN) injection 4 mg  4 mg Intravenous Once Atilano Ina, MD   4 mg at 11/26/11 9604   Medications Prior to Admission  Medication Sig Dispense Refill  . carbamazepine (TEGRETOL) 200 MG tablet Take 600 mg by mouth daily.       . ergocalciferol (VITAMIN D2) 50000 UNITS capsule Take  50,000 Units by mouth once a week.        Marland Kitchen LAMICTAL XR 200 MG TB24 Take 1 tablet by mouth Twice daily.      . zaleplon (SONATA) 5 MG capsule Take 1 capsule by mouth daily.        Results for orders placed during the hospital encounter of 11/25/11 (from the past 48 hour(s))  CBC     Status: Normal   Collection Time   11/25/11 10:27 PM      Component Value Range Comment   WBC 10.1  4.0 - 10.5 (K/uL)    RBC 4.16  3.87 - 5.11 (MIL/uL)    Hemoglobin 12.9  12.0 - 15.0 (g/dL)    HCT 54.0  98.1 - 19.1 (%)    MCV 88.5  78.0 - 100.0 (fL)    MCH 31.0  26.0 - 34.0 (pg)    MCHC 35.1  30.0 - 36.0 (g/dL)    RDW 47.8  29.5 - 62.1 (%)    Platelets 239  150 - 400 (K/uL)   DIFFERENTIAL     Status: Abnormal   Collection Time   11/25/11 10:27 PM      Component Value Range Comment   Neutrophils Relative 84 (*) 43 - 77 (%)    Neutro Abs 8.5 (*) 1.7 - 7.7 (K/uL)    Lymphocytes Relative 9 (*) 12 - 46 (%)    Lymphs Abs 0.9  0.7 - 4.0 (K/uL)    Monocytes Relative 5  3 - 12 (%)    Monocytes Absolute 0.5  0.1 - 1.0 (K/uL)    Eosinophils Relative 2  0 - 5 (%)    Eosinophils Absolute 0.2  0.0 - 0.7 (K/uL)    Basophils Relative 0  0 - 1 (%)    Basophils Absolute 0.0  0.0 - 0.1 (K/uL)   POCT I-STAT, CHEM 8     Status: Normal   Collection Time   11/25/11 10:56 PM      Component Value Range Comment   Sodium 138  135 - 145 (mEq/L)    Potassium 3.8  3.5 - 5.1 (mEq/L)    Chloride 97  96 - 112 (mEq/L)    BUN 8  6 - 23 (mg/dL)    Creatinine, Ser 3.08  0.50 - 1.10 (mg/dL)    Glucose, Bld 95  70 - 99 (mg/dL)    Calcium, Ion 6.57  1.12 - 1.32 (mmol/L)    TCO2 30  0 - 100 (mmol/L)    Hemoglobin 12.9  12.0 - 15.0 (g/dL)    HCT 84.6  96.2 - 95.2 (%)   URINALYSIS, ROUTINE W REFLEX MICROSCOPIC     Status: Abnormal  Collection Time   11/25/11 11:03 PM      Component Value Range Comment   Color, Urine YELLOW  YELLOW     APPearance CLEAR  CLEAR     Specific Gravity, Urine 1.010  1.005 - 1.030     pH 8.5 (*) 5.0  - 8.0     Glucose, UA NEGATIVE  NEGATIVE (mg/dL)    Hgb urine dipstick NEGATIVE  NEGATIVE     Bilirubin Urine NEGATIVE  NEGATIVE     Ketones, ur NEGATIVE  NEGATIVE (mg/dL)    Protein, ur NEGATIVE  NEGATIVE (mg/dL)    Urobilinogen, UA 0.2  0.0 - 1.0 (mg/dL)    Nitrite NEGATIVE  NEGATIVE     Leukocytes, UA NEGATIVE  NEGATIVE  MICROSCOPIC NOT DONE ON URINES WITH NEGATIVE PROTEIN, BLOOD, LEUKOCYTES, NITRITE, OR GLUCOSE <1000 mg/dL.    RADIOLOGICAL STUDIES: I have personally reviewed the radiological exams myself  Ct Abdomen Pelvis W Contrast  11/26/2011  *RADIOLOGY REPORT*  Clinical Data: Right lower quadrant pain and nausea and vomiting. Seizures.  CT ABDOMEN AND PELVIS WITH CONTRAST  Technique:  Multidetector CT imaging of the abdomen and pelvis was performed following the standard protocol during bolus administration of intravenous contrast.  Contrast: 80mL OMNIPAQUE IOHEXOL 300 MG/ML IV SOLN  Comparison: Represent 10/18/2010  Findings: The lung bases are clear.  The liver, spleen, gallbladder, pancreas, adrenal glands, kidneys, stomach, abdominal aorta, and retroperitoneal lymph nodes are unremarkable.  The small bowel are not distended.  Stool filled colon without distension or wall thickening.  Pelvis:  Retrocecal appendix is distended, with diameter of about 9 mm.  There is periappendiceal stranding and inflammation.  Changes are consistent with appendicitis.  No periappendiceal abscess. Small amount of free fluid in the pelvis may be physiologic or reactive.  Uterus and adnexal structures are not significantly enlarged.  The bladder wall is not thickened.  IMPRESSION: Mild distension of the appendix with periappendiceal inflammation consistent with acute appendicitis.  No appendiceal abscess.  Note the retrocecal location of the appendix.  Original Report Authenticated By: Marlon Pel, M.D.    Review of Systems  Constitutional: Positive for fever and weight loss.  HENT: Positive for  hearing loss. Negative for nosebleeds and congestion.   Eyes: Negative for blurred vision, double vision and discharge.  Respiratory: Negative for sputum production, shortness of breath and wheezing.   Cardiovascular: Negative for chest pain, palpitations and leg swelling.  Gastrointestinal:       See hpi  Genitourinary: Negative for dysuria, urgency and hematuria.  Musculoskeletal: Negative for myalgias and joint pain.       Fall several months ago- has L shoulder subluxation; also having balance issues b/c LE weakness  Skin: Negative for itching and rash.  Neurological: Positive for sensory change (reports decreased sensation in LE), seizures (last partial complex sz last weekend. last tonic-clonic sz about 1 month ago; has to take anti-sz at specific times) and weakness (has b/l LE weakness; getting fitted for braces & being seen at [facility] in jan 2013). Negative for tingling, tremors, speech change, loss of consciousness and headaches.  Endo/Heme/Allergies: Does not bruise/bleed easily.  Psychiatric/Behavioral: Negative for depression, suicidal ideas and substance abuse.    Blood pressure 98/53, pulse 100, temperature 101 F (38.3 C), temperature source Oral, resp. rate 16, SpO2 95.00%. Physical Exam  Vitals reviewed. Constitutional: She is oriented to person, place, and time. She appears well-developed and well-nourished. She is cooperative. She appears ill. No distress.  HENT:  Head:  Normocephalic and atraumatic.  Right Ear: External ear normal.  Left Ear: External ear normal.  Eyes: Conjunctivae are normal. Right eye exhibits no discharge. Left eye exhibits no discharge. No scleral icterus.  Neck: Normal range of motion. No tracheal deviation present. No thyromegaly present.  Cardiovascular: Normal rate, regular rhythm, normal heart sounds and intact distal pulses.   Respiratory: Effort normal and breath sounds normal. No respiratory distress. She has no wheezes.  GI: Soft. Bowel  sounds are normal. She exhibits no distension. There is tenderness in the right lower quadrant and suprapubic area. There is rebound, guarding and tenderness at McBurney's point.       Has localized peritonitis in RLQ  Musculoskeletal: She exhibits no edema.       RLE inwardly rotated. Decreased strength b/l LE. RUE hand grip >> LUE hand drip.   Gross sensation intact but perhaps in B/L LE  Lymphadenopathy:    She has no cervical adenopathy.  Neurological: She is alert and oriented to person, place, and time.  Skin: Skin is warm and dry. No rash noted. She is not diaphoretic. No erythema.  Psychiatric: She has a normal mood and affect. Her behavior is normal. Judgment and thought content normal.     Assessment/Plan     . HEARING LOSS, BILATERAL  . SEIZURE DISORDER  . Muscle weakness of lower extremity  . Acute appendicitis with localized peritonitis   With respect to her abdominal pain, her physical exam and CT scan are consistent with acute appendicitis. Her story is slightly atypical for acute appendicitis but she does have localized peritonitis on exam. We did discuss the etiology and management of appendicitis. We discussed operative and nonoperative management. I Drew diagrams. I recommended appendectomy and IV antibiotics. We discussed the risk and benefits of surgery including but not limited to bleeding, infection, injury to surrounding structures, staple line complications such as bleeding or leakage, blood clot formation, need to convert to an open procedure, abscess formation, postoperative ileus, incisional hernia formation, general anesthesia risk, wound infection, as well as the typical aftercare.  We'll start her on IV antibiotics and plan to take her urgently to the operating room.  With respect to her seizure disorder, we will continue her on her antiepileptic medication following her home dosing schedule.I did explain that she is at an increased risk for having a seizure  event because of her appendicitis.  We will place sequential compression devices. We'll start IV antibiotics. She will be placed on fall precautions.   Mary Sella. Andrey Campanile, MD, FACS General, Bariatric, & Minimally Invasive Surgery [facility], [place]   [facility] M 11/26/2011, 4:34 AM

## 2011-11-26 NOTE — ED Provider Notes (Signed)
Evaluation and management procedures were performed by the PA/NP under my supervision/collaboration.   Jaquavis Felmlee, MD 11/26/11 0033 

## 2011-11-26 NOTE — Transfer of Care (Signed)
Immediate Anesthesia Transfer of Care Note  Patient: [name]  Procedure(s) Performed:  APPENDECTOMY LAPAROSCOPIC  Patient Location: PACU  Anesthesia Type: General  Level of Consciousness: awake, sedated and patient cooperative  Airway & Oxygen Therapy: Patient Spontanous Breathing and Patient connected to nasal cannula oxygen  Post-op Assessment: Report given to PACU RN, Post -op Vital signs reviewed and stable and Patient moving all extremities  Post vital signs: Reviewed and stable  Complications: No apparent anesthesia complications

## 2011-11-26 NOTE — Op Note (Signed)
[name], [name] NO.:  [id number]  MEDICAL RECORD NO.:  [id number]  LOCATION:  [facility]                         FACILITY:  [facility]  PHYSICIAN:  Annette Sella. Andrey Campanile, MD     DATE OF BIRTH:  04/19/1991  DATE OF PROCEDURE:  11/26/2011 DATE OF DISCHARGE:                              OPERATIVE REPORT   PREOPERATIVE DIAGNOSIS:  Acute appendicitis.  POSTOPERATIVE DIAGNOSIS:  Acute suppurative appendicitis.  PROCEDURE:  Laparoscopic appendectomy.  SURGEON:  Annette Sella. Andrey Campanile, MD, FACS  ASSISTANT SURGEON:  None.  ANESTHESIA:  General plus 0.25% Marcaine with epi, 18 mL.  SPECIMEN:  Appendix sent to pathology.  ESTIMATED BLOOD LOSS:  Minimal.  FINDINGS:  The patient had purulent fluid in her cul-de-sac and in her pelvis.  The appendix was non perforated, non ruptured.  It was retrocecal.  INDICATIONS FOR PROCEDURE:  The patient is a 20 year old female who states that she started having some abdominal pain about 5 days ago on the right side.  It was intermittent, however, it returned yesterday evening, it was constant, severe, and sharp.  She did have a temperature and nausea.  She came to the emergency room because the pain was very severe and painful with any movement or motion.  She underwent a CT scan, which demonstrated retrocecal appendicitis.  Based on physical exam and CT findings, it was consistent with acute appendicitis.  We discussed the risks and benefits of surgery including, but not limited to, bleeding, infection, injury to surrounding structures, staple line complications such as bleeding or leakage, wound infection, abscess formation, hernia formation, blood clot formation, injury to surrounding structures, need to convert to an open procedure, and a general postoperative course.  We also discussed possibility of an increased risk and perioperative seizure risk.  DESCRIPTION OF PROCEDURE:  After obtaining informed consent, the patient was taken to the  operating room, placed supine on the operating table. General endotracheal anesthesia was established.  A Foley catheter was placed.  Her left arm was tucked with the appropriate padding.  She received antibiotics prior to skin incision.  A surgical time-out was performed.  Her abdomen is prepped and draped in usual standard surgical fashion.  Local was infiltrated at the base of her umbilicus.  Next, a curvilinear 1 cm incision was made infraumbilically.  The fascia was grasped and lifted anteriorly.  Next, the fascia was incised with the 11 blade and abdominal cavity is entered.  No pursestring suture was made around the fascial edges using a 0 Vicryl.  A 12-mm Hasson trocar was placed.  A pneumoperitoneum was smoothly established up to a patient pressure of 15 mmHg.  Laparoscope was advanced.  There was no evidence of injury to surrounding structure.  The patient had a colon full of stool.  Upon viewing the pelvic structures, there were some purulent fluid in the cul-de-sac and in the pelvis.  The uterus, tubes, and ovaries appeared normal.  I placed two 5 mm trocars, 1 in the suprapubic position and 1 in the left lower quadrant all under direct visualization after local had been infiltrated.  The terminal ileum and cecum were identified.  I was able to rotate  the cecum medially and identified the appendix.  It was retrocecal.  I was able to peel it off of the cecum. Just the way it was lying, I elected to isolate the base first and then take down the mesoappendix.  Using a Teaching laboratory technician, I was able to create a window at the base of the appendix.  I then took down a small amount of the mesoappendix with the Harmonic Scalpel, had a nice window. Using a 45-mm white load on an articulating laparoscopic stapler, I stapled across the appendiceal base taking a small amount of cecum with it.  I then took down the mesoappendix in a serial fashion using the Harmonic Scalpel.  This freed the  appendix.  An Endobag was advanced through the umbilical trocar and the appendix was placed within the specimen bag.  It was extracted, pneumoperitoneum was reestablished.  I then aspirated the pelvis in the right lower quadrant.  I then flushed out the suction irrigator catheter on the back table.  I then irrigated the pelvis and right lower quadrant with 2 L of saline.  The staple line was hemostatic.  There is no evidence of bleeding.  There is no evidence of enterotomy.  The Hasson trocar was removed and the previously placed pursestring suture was tied down, thus obliterating the fascial defect. I did place an additional single interrupted 0 Vicryl stitch at the umbilical closure.  This was viewed laparoscopically.  There was nothing within our umbilical closure.  Pneumoperitoneum was released and the remaining trocars were removed.  Skin incisions were closed with a 4-0 Monocryl in a subcuticular fashion followed by the application of Dermabond.  The Foley catheter was removed.  All needle, instrument, and sponge counts were correct x2.  There are no immediate complications. The patient tolerated the procedure well.     Annette Sella. Andrey Campanile, MD     EMW/MEDQ  D:  11/26/2011  T:  11/26/2011  Job:  [id number]

## 2011-11-26 NOTE — ED Notes (Addendum)
Dr Andrey Campanile on floor and will see pt soon.

## 2011-11-26 NOTE — Anesthesia Preprocedure Evaluation (Addendum)
Anesthesia Evaluation  Patient identified by MRN, date of birth, ID band Patient awake    Reviewed: Allergy & Precautions, H&P , NPO status   History of Anesthesia Complications Negative for: history of anesthetic complications  Airway Mallampati: I TM Distance: <3 FB Neck ROM: full    Dental  (+) Teeth Intact   Pulmonary neg pulmonary ROS,    Pulmonary exam normal       Cardiovascular neg cardio ROS - dysrhythmias     Neuro/Psych Seizures -, Poorly Controlled,   Neuromuscular disease    GI/Hepatic negative GI ROS, Neg liver ROS,   Endo/Other  Negative Endocrine ROS  Renal/GU negative Renal ROS  Genitourinary negative   Musculoskeletal   Abdominal   Peds  Hematology   Anesthesia Other Findings   Reproductive/Obstetrics negative OB ROS                          Anesthesia Physical Anesthesia Plan  ASA: III and Emergent  Anesthesia Plan: General   Post-op Pain Management:    Induction: Intravenous, Rapid sequence and Cricoid pressure planned  Airway Management Planned: Oral ETT  Additional Equipment:   Intra-op Plan:   Post-operative Plan: Extubation in OR  Informed Consent: I have reviewed the patients History and Physical, chart, labs and discussed the procedure including the risks, benefits and alternatives for the proposed anesthesia with the patient or authorized representative who has indicated his/her understanding and acceptance.   Dental advisory given  Plan Discussed with: CRNA and Surgeon  Anesthesia Plan Comments:        Anesthesia Quick Evaluation

## 2011-11-27 ENCOUNTER — Encounter (HOSPITAL_COMMUNITY): Payer: Self-pay | Admitting: *Deleted

## 2011-11-27 DIAGNOSIS — J45909 Unspecified asthma, uncomplicated: Secondary | ICD-10-CM

## 2011-11-27 LAB — CBC
HCT: 33.7 % — ABNORMAL LOW (ref 36.0–46.0)
Hemoglobin: 11.3 g/dL — ABNORMAL LOW (ref 12.0–15.0)
MCH: 30.8 pg (ref 26.0–34.0)
MCV: 91.8 fL (ref 78.0–100.0)
RBC: 3.67 MIL/uL — ABNORMAL LOW (ref 3.87–5.11)
WBC: 5.1 10*3/uL (ref 4.0–10.5)

## 2011-11-27 LAB — COMPREHENSIVE METABOLIC PANEL
AST: 21 U/L (ref 0–37)
CO2: 29 mEq/L (ref 19–32)
Calcium: 9.5 mg/dL (ref 8.4–10.5)
Chloride: 102 mEq/L (ref 96–112)
Creatinine, Ser: 0.63 mg/dL (ref 0.50–1.10)
Glucose, Bld: 93 mg/dL (ref 70–99)
Total Bilirubin: 0.1 mg/dL — ABNORMAL LOW (ref 0.3–1.2)

## 2011-11-27 MED ORDER — ACETAMINOPHEN 325 MG PO TABS: 650.0000 mg | ORAL_TABLET | Freq: Four times a day (QID) | ORAL | Status: AC | PRN

## 2011-11-27 MED ORDER — OXYCODONE HCL 5 MG PO TABS: 5.0000 mg | ORAL_TABLET | ORAL | Status: AC | PRN

## 2011-11-27 MED ORDER — CIPROFLOXACIN HCL 500 MG PO TABS: 500.0000 mg | ORAL_TABLET | Freq: Two times a day (BID) | ORAL | Status: AC

## 2011-11-27 MED ORDER — METRONIDAZOLE 500 MG PO TABS: 500.0000 mg | ORAL_TABLET | Freq: Three times a day (TID) | ORAL | Status: AC

## 2011-11-27 MED ORDER — CIPROFLOXACIN HCL 500 MG PO TABS
500.0000 mg | ORAL_TABLET | Freq: Two times a day (BID) | ORAL | Status: DC
  Filled 2011-11-27 (×2): qty 1

## 2011-11-27 MED ORDER — ALBUTEROL SULFATE (5 MG/ML) 0.5% IN NEBU
2.5000 mg | INHALATION_SOLUTION | Freq: Four times a day (QID) | RESPIRATORY_TRACT | Status: DC | PRN
  Administered 2011-11-27: 5 mg via RESPIRATORY_TRACT
  Filled 2011-11-27: qty 1

## 2011-11-27 MED ORDER — METRONIDAZOLE 500 MG PO TABS
500.0000 mg | ORAL_TABLET | Freq: Three times a day (TID) | ORAL | Status: DC
  Filled 2011-11-27 (×3): qty 1

## 2011-11-27 NOTE — Discharge Summary (Signed)
Discharge. [name]. Corliss Skains, MD, [facility] Surgery  11/27/2011 4:28 PM

## 2011-11-27 NOTE — Progress Notes (Signed)
1 Day Post-Op  Subjective: Tough night with pain, also concerned about legs continuing to tingle.  She has less LE control when this occurs. Tolerating fulls, will advance to soft diet.  Objective: Vital signs in last 24 hours: Temp:  [97.9 F (36.6 C)-98.4 F (36.9 C)] 98.2 F (36.8 C) (12/13 0521) Pulse Rate:  [60-69] 64  (12/13 0521) Resp:  [16-18] 18  (12/13 0521) BP: (95-179)/(48-58) 95/52 mmHg (12/13 0521) SpO2:  [96 %-98 %] 98 % (12/12 2200)    Intake/Output from previous day: 12/12 0701 - 12/13 0700 In: 2885 [P.O.:1060; I.V.:1725; IV Piggyback:100] Out: -  Intake/Output this shift:    General appearance: alert, cooperative and no distress Resp: clear to auscultation bilaterally GI: soft, non-tender; bowel sounds normal; no masses,  no organomegaly and incisions look fine.  Lab Results:   Basename 11/27/11 0505 11/25/11 2256 11/25/11 2227  WBC 5.1 -- 10.1  HGB 11.3* 12.9 --  HCT 33.7* 38.0 --  PLT 217 -- 239    BMET  Basename 11/27/11 0505 11/25/11 2256  NA 137 138  K 4.9 3.8  CL 102 97  CO2 29 --  GLUCOSE 93 95  BUN 4* 8  CREATININE 0.63 0.80  CALCIUM 9.5 --   PT/INR No results found for this basename: LABPROT:2,INR:2 in the last 72 hours   Studies/Results: Ct Abdomen Pelvis W Contrast  11/26/2011  *RADIOLOGY REPORT*  Clinical Data: Right lower quadrant pain and nausea and vomiting. Seizures.  CT ABDOMEN AND PELVIS WITH CONTRAST  Technique:  Multidetector CT imaging of the abdomen and pelvis was performed following the standard protocol during bolus administration of intravenous contrast.  Contrast: 80mL OMNIPAQUE IOHEXOL 300 MG/ML IV SOLN  Comparison: Represent 10/18/2010  Findings: The lung bases are clear.  The liver, spleen, gallbladder, pancreas, adrenal glands, kidneys, stomach, abdominal aorta, and retroperitoneal lymph nodes are unremarkable.  The small bowel are not distended.  Stool filled colon without distension or wall thickening.  Pelvis:   Retrocecal appendix is distended, with diameter of about 9 mm.  There is periappendiceal stranding and inflammation.  Changes are consistent with appendicitis.  No periappendiceal abscess. Small amount of free fluid in the pelvis may be physiologic or reactive.  Uterus and adnexal structures are not significantly enlarged.  The bladder wall is not thickened.  IMPRESSION: Mild distension of the appendix with periappendiceal inflammation consistent with acute appendicitis.  No appendiceal abscess.  Note the retrocecal location of the appendix.  Original Report Authenticated By: Marlon Pel, M.D.    Anti-infectives: Anti-infectives     Start     Dose/Rate Route Frequency Ordered Stop   11/26/11 1000   ciprofloxacin (CIPRO) IVPB 400 mg        400 mg 200 mL/hr over 60 Minutes Intravenous Every 12 hours 11/26/11 0758 11/28/11 0959   11/26/11 0900   metroNIDAZOLE (FLAGYL) IVPB 500 mg        500 mg 100 mL/hr over 60 Minutes Intravenous Every 8 hours 11/26/11 0758 11/28/11 0859   11/26/11 0415   ciprofloxacin (CIPRO) IVPB 400 mg        400 mg 200 mL/hr over 60 Minutes Intravenous  Once 11/26/11 0413 11/26/11 0505   11/26/11 0415   metroNIDAZOLE (FLAGYL) IVPB 500 mg        500 mg 100 mL/hr over 60 Minutes Intravenous  Once 11/26/11 0413 11/26/11 0525         Current Facility-Administered Medications  Medication Dose Route Frequency Provider Last Rate  Last Dose  . acetaminophen (OFIRMEV) IVPB 1,000 mg  1,000 mg Intravenous Q6H Atilano Ina, MD   1,000 mg at 11/27/11 0423  . carbamazepine (TEGRETOL) tablet 200 mg  200 mg Oral TID Atilano Ina, MD   200 mg at 11/26/11 2217  . carbamazepine (TEGRETOL) tablet 200 mg  200 mg Oral To PACU Colleen Can, PHARMD   200 mg at 11/26/11 0805  . ciprofloxacin (CIPRO) IVPB 400 mg  400 mg Intravenous Q12H Atilano Ina, MD   400 mg at 11/26/11 2223  . dextrose 5 % and 0.45 % NaCl with KCl 20 mEq/L infusion   Intravenous Continuous Atilano Ina, MD 75 mL/hr at 11/27/11 0600    . docusate sodium (COLACE) capsule 100 mg  100 mg Oral BID Atilano Ina, MD   100 mg at 11/26/11 2143  . fentaNYL (SUBLIMAZE) injection 50-100 mcg  50-100 mcg Intravenous PRN Bedelia Person, MD      . heparin injection 5,000 Units  5,000 Units Subcutaneous Q8H Atilano Ina, MD   5,000 Units at 11/26/11 2144  . HYDROmorphone (DILAUDID) injection 0.5-1 mg  0.5-1 mg Intravenous Q3H PRN Wilmon Arms. Tsuei, MD   1 mg at 11/27/11 0521  . lamoTRIgine (LAMICTAL) tablet 200 mg  200 mg Oral To PACU Atilano Ina, MD   100 mg at 11/26/11 0805  . LamoTRIgine TB24 200 mg  1 tablet Oral BID Atilano Ina, MD   200 mg at 11/26/11 2300  . metroNIDAZOLE (FLAGYL) IVPB 500 mg  500 mg Intravenous Q8H Atilano Ina, MD   500 mg at 11/27/11 1610  . midazolam (VERSED) injection 1-2 mg  1-2 mg Intravenous PRN Bedelia Person, MD      . oxyCODONE (Oxy IR/ROXICODONE) immediate release tablet 5-10 mg  5-10 mg Oral Q4H PRN Atilano Ina, MD   10 mg at 11/26/11 1804  . promethazine (PHENERGAN) injection 12.5 mg  12.5 mg Intravenous Q6H PRN Atilano Ina, MD      . zolpidem [facility]) tablet 5 mg  5 mg Oral QHS PRN Sherrie George, PA      . DISCONTD: iohexol (OMNIPAQUE) 300 MG/ML solution 20 mL  20 mL Oral Q1 Hr x 2 Medication Radiologist      . DISCONTD: morphine 2 MG/ML injection 1-3 mg  1-3 mg Intravenous Q1H PRN Atilano Ina, MD   2 mg at 11/26/11 9604    Assessment/Plan Acute Suppurative appendicitis Seizures Bilateral LE weakness due to seizures, with some extended neuropathy post op  Bilat Hearing Loss Plan: Mobilize, advance diet try oxycodone for pain.  Continue antibiotics.  Hope top d/c home later.  If her legs continue to be an issue call Neuro.  LOS: 2 days    [name] 11/27/2011

## 2011-11-27 NOTE — Discharge Summary (Signed)
Physician Discharge Summary  Patient ID: [name] MRN: [id number] DOB/AGE: October 12, 1991 20 y.o.  Admit date: 11/25/2011 Discharge date: 11/27/2011  Admission Diagnoses: Acute appendicitis  Discharge Diagnoses: Acute suppurative appendicitis Principal Problem:  *Acute appendicitis with localized peritonitis Active Problems:  HEARING LOSS, BILATERAL  SEIZURE DISORDER  Muscle weakness of lower extremity  Asthma   PROCEDURES: Laparoscopic appendectomy   Hospital Course: This is a 20 year old white female comes to the emergency room complaining of right abdominal pain. She developed nausea and a fever with it. Symptoms started 2 days prior to admission improved and reoccurred. Workup in the ER and CT scan are consistent with acute appendicitis start her Wilson's opinion she was slightly atypical and had a local peritonitis on exam. Patient was admitted for IV antibiotics and taken urgently to the OR for laparoscopic appendectomy. Please see the dictated H&P for her past medical history  Patient underwent laparoscopic appendectomy and was transferred to the floor postoperatively. As noted above she has a history of hearing loss seizure disorder and lower extremity muscle weakness. Her mom and her service dog  which monitored her for seizures, who transferred to pediatrics. On the first postoperative morning she was stable. She had some postoperative concern with tingling in her lower extremities which is normally transit, but was lasting longer than normal. She also had some wheezing on the second postoperative morning. She has a history of asthma. She is maintained on IV antibiotic, her diet was advanced, she was mobilized. By the afternoon of 11/27/2011, she felt she was ready to go home. She did have some nausea earlier in the day. This is resolved she is tolerating full liquids. She's been placed back on her preadmission medicines, directly after surgery. Her incisions looked good. Her mother  is with her and she feels patient was ready for discharge also. Followup with Dr. Andrey Campanile and 2 weeks. Continue antibiotics for 7 more days. Condition on discharge: Improved. This is will [facility] physician assistant for Dr. Manus Rudd.        Disposition: Final discharge disposition not confirmed   Current Discharge Medication List    START taking these medications   Details  acetaminophen (TYLENOL) 325 MG tablet Take 2 tablets (650 mg total) by mouth every 6 (six) hours as needed for pain. Qty: 30 tablet, Refills: 0    ciprofloxacin (CIPRO) 500 MG tablet Take 1 tablet (500 mg total) by mouth 2 (two) times daily. Qty: 14 tablet, Refills: 0    metroNIDAZOLE (FLAGYL) 500 MG tablet Take 1 tablet (500 mg total) by mouth every 8 (eight) hours. Qty: 21 tablet, Refills: 0    oxyCODONE (OXY IR/ROXICODONE) 5 MG immediate release tablet Take 1-2 tablets (5-10 mg total) by mouth every 4 (four) hours as needed. Qty: 40 tablet, Refills: 0      CONTINUE these medications which have NOT CHANGED   Details  carbamazepine (TEGRETOL) 200 MG tablet Take 600 mg by mouth daily.     ergocalciferol (VITAMIN D2) 50000 UNITS capsule Take 50,000 Units by mouth once a week.      LAMICTAL XR 200 MG TB24 Take 1 tablet by mouth Twice daily.    zaleplon (SONATA) 5 MG capsule Take 1 capsule by mouth daily.       Follow-up Information    Follow up with Judie Petit, MD. (Call for all your medical issues)       Follow up with Atilano Ina, MD. Make an appointment in 2 weeks.   Contact information:  [facility] [address] [place] [address] [phone]          Signed: Sherrie George 11/27/2011, 3:59 PM

## 2011-11-27 NOTE — Progress Notes (Signed)
Patient complains of some mild chest tightness - mild wheezing on auscultation.  Will order breathing treatment.  Wilmon Arms. Corliss Skains, MD, [facility]  11/27/2011 8:44 AM

## 2011-12-10 NOTE — ED Provider Notes (Signed)
Medical screening examination/treatment/procedure(s) were performed by non-physician practitioner and as supervising physician I was immediately available for consultation/collaboration.   Nuala Chiles E Maytal Mijangos, MD 12/10/11 0737 

## 2011-12-18 ENCOUNTER — Encounter (INDEPENDENT_AMBULATORY_CARE_PROVIDER_SITE_OTHER): Payer: BC Managed Care – PPO | Admitting: Surgery

## 2012-01-12 ENCOUNTER — Encounter (INDEPENDENT_AMBULATORY_CARE_PROVIDER_SITE_OTHER): Payer: BC Managed Care – PPO | Admitting: General Surgery

## 2012-01-14 ENCOUNTER — Encounter (INDEPENDENT_AMBULATORY_CARE_PROVIDER_SITE_OTHER): Payer: BC Managed Care – PPO | Admitting: General Surgery

## 2012-02-24 ENCOUNTER — Emergency Department: Payer: Self-pay | Admitting: Emergency Medicine

## 2013-06-29 ENCOUNTER — Encounter: Payer: Self-pay | Admitting: Neurology

## 2013-06-29 ENCOUNTER — Ambulatory Visit (INDEPENDENT_AMBULATORY_CARE_PROVIDER_SITE_OTHER): Payer: BC Managed Care – PPO | Admitting: Neurology

## 2013-06-29 VITALS — BP 136/87 | HR 75 | Resp 16 | Ht 62.0 in | Wt 107.0 lb

## 2013-06-29 DIAGNOSIS — G40802 Other epilepsy, not intractable, without status epilepticus: Secondary | ICD-10-CM

## 2013-06-29 DIAGNOSIS — R4789 Other speech disturbances: Secondary | ICD-10-CM

## 2013-06-29 DIAGNOSIS — G43909 Migraine, unspecified, not intractable, without status migrainosus: Secondary | ICD-10-CM

## 2013-06-29 MED ORDER — CARBAMAZEPINE 200 MG PO TABS: 200.0000 mg | ORAL_TABLET | Freq: Three times a day (TID) | ORAL | Status: DC

## 2013-06-29 MED ORDER — ALPRAZOLAM 0.25 MG PO TABS: 0.2500 mg | ORAL_TABLET | Freq: Every evening | ORAL | Status: DC | PRN

## 2013-06-29 MED ORDER — LAMOTRIGINE ER 200 MG PO TB24: 1.0000 | ORAL_TABLET | Freq: Two times a day (BID) | ORAL | Status: DC

## 2013-06-29 NOTE — Addendum Note (Signed)
Addended by: Melvyn Novas on: 06/29/2013 04:25 PM   Modules accepted: Orders

## 2013-06-29 NOTE — Addendum Note (Signed)
Addended by: Melvyn Novas on: 06/29/2013 02:22 PM   Modules accepted: Orders

## 2013-06-29 NOTE — Patient Instructions (Signed)
Mitochondrial Myopathies Mitochondrial myopathies are a group of neuromuscular diseases. Some of the more common mitochondrial myopathies include:  Kearns-Sayre syndrome.  Myoclonus epilepsy with ragged-red fibers (MERRF).  Mitochondrial encephalomyopathy lactic acidosis.  Stroke-like episodes (MELAS). CAUSES  These diseases are caused by damage to the mitochondria. Those are small, energy-producing structures found in every cell in the body. They serve as the cells' "power plants." Nerve cells in the brain and muscles require a great deal of energy. They appear to be particularly damaged when mitochondrial dysfunction happens. SYMPTOMS  The symptoms include:  Muscle weakness or exercise intolerance.  Heart failure or rhythm disturbances.  Dementia.  Movement disorders.  Stroke-like episodes.  Deafness.  Blindness.  Droopy eyelids.  Reduced ability of the eyes to move.  Vomiting.  Shaking and twitching (seizures). The disorders range in severity from progressive weakness to death. The age of onset ranges from birth to adulthood. Most cases occur before the age of 29. Exercise intolerance or muscle weakness usually develops by the age of 4. During physical activity, muscles may become easily fatigued or weak. Muscle cramping may rarely occur. Other symptoms may include:  Nausea.  Headache.  Being out of breath. TREATMENT  There is no specific treatment for any of the mitochondrial myopathies. But physical therapy may extend the range of movement of muscles and improve dexterity. Vitamin therapies may improve function in some patients. They include:  Riboflavin.  Coenzyme Q.  Vitamins C and K.  Carnitine. This is a specialized amino acid. Document Released: 11/21/2002 Document Revised: 02/23/2012 Document Reviewed: 12/01/2005 [facility] Patient Information 2014 [place], [place].

## 2013-06-29 NOTE — Progress Notes (Signed)
[facility]  Provider:  Dr Zacharee Gaddie Referring Provider: Lindley Magnus, MD Primary Care Physician:  Judie Petit, MD  Chief Complaint  Patient presents with  . Seizures    RM#[id number]    HPI:  [name] is a 22 y.o. female here as a referral from Dr. Langston Masker , [facility] neurological department.   [name] is an established patient with [facility]- with a peculiar history of seizures or other paroxysmal spells. She lost her father to sudden cardiac arrest last autumn, and had been lost in follow up.    I have followed [name] since the year 2010 at the time the patient had reported multiple paroxysmal spells but appeared to be accompanied by a or rub of near-syncope. Dr. Cato Mulligan primary care physician had announced her and made her appointment for her in the meanwhile she had 2 spells that were described as calm bolstering generalized seizures she had bitten her bugle tissue she had become incontinent.  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  The patient was treated with Tegretol and still had  paroxysmal spells associated with  loss of consciousness , they were witnessed by friends and from this time on Tegretol she had no longer aura that  warned her. During the spell her eyes stayed open but she was unable to focus or contact the patient had not driving license at that time and thus privileges and didn't need to be revoked.  She also was at [facility] seen in the internal medicine Department , where a vitamin D deficiency and low enzyme Q10 were identified and recommended to be taken as supplements.  She felt less fatigued and less malaise and had less joint and muscle pains and EMG was scheduled for November 2012.  We added Lamictal to the tegretol.   My last visit with the patient was on 8-24 2012 is almost 2 years ago. The patient has a over 80% hearing loss she  has reported that her handwriting has become "untidy" , she of has dropped objects mostly with the left hand.  She does  not have  dysarthria , her speech has not changed. At the end of last year the patient had another fall related to a generalized seizure she describes, and she dislocated her already pre-injured left shoulder again. She is seeing an orthopedist at [facility].  [name] , brings her seizure - therapy dog , a certified service animal, to her appointment today.  The patent has had less frequent spells , but still has 2 "big" seizures a year on two antiepileptic medication. She noted a sensitivity to flashing lights. 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 She gets headaches and feels nauseated.      Review of Systems: Out of a complete 14 system review, the patient complains of only the following symptoms, and all other reviewed systems are negative. Seizures paroxysmal  Spells. Injuries during falls in  LOC episodes.   History   Social History  . Marital Status: Single    Spouse Name: N/A    Number of Children: N/A  . Years of Education: college   Occupational History  . Not on file.   Social History Main Topics  . Smoking status: Never Smoker   . Smokeless tobacco: Not  on file  . Alcohol Use: No  . Drug Use: No  . Sexually Active: Not on file   Other Topics Concern  . Not on file   Social History Narrative  . No narrative on file    Family History  Problem Relation Age of Onset  . Polymyalgia rheumatica Mother   . Heart disease Maternal Grandmother   . Cancer Maternal Grandmother   . Heart disease Maternal Grandfather   . Stroke Maternal Grandfather   . Diabetes Maternal Grandfather   . COPD Maternal Grandfather   . Kidney failure Maternal Grandfather   . Heart disease Paternal Grandmother   . Heart disease Paternal Grandfather   . Migraines Paternal  Grandfather     Past Medical History  Diagnosis Date  . Seizure disorder 2010  . Hearing loss   . Muscle weakness of lower extremity     b/l  . Shoulder subluxation, left   . Seizures   . Migraine headache   . Deafness     progressive  . Mononucleosis 2009    no lymphatic symptoms  . Fatigue   . History of sprained wrist     Past Surgical History  Procedure Laterality Date  . Laparoscopic appendectomy  11/26/2011    Procedure: APPENDECTOMY LAPAROSCOPIC;  Surgeon: Atilano Ina, MD;  Location: [facility] OR;  Service: General;  Laterality: N/A;  . Ovarian cyst removal  2/11    2 removed    Current Outpatient Prescriptions  Medication Sig Dispense Refill  . carbamazepine (TEGRETOL) 200 MG tablet Take 600 mg by mouth daily. XR- 12H-TABLET 200MG       . CO ENZYME Q-10 PO Take by mouth 2 (two) times daily.      . diphenhydramine-acetaminophen (TYLENOL PM EXTRA STRENGTH) 25-500 MG TABS Take 1 tablet by mouth at bedtime as needed.      Marland Kitchen LAMICTAL XR 200 MG TB24 Take 1 tablet by mouth Twice daily.       No current facility-administered medications for this visit.    Allergies as of 06/29/2013 - Review Complete 06/29/2013  Allergen Reaction Noted  . Shellfish allergy  11/26/2011  . Cefuroxime axetil      Vitals: BP 136/87  Pulse 75  Resp 16  Ht 5\' 2"  (1.575 m)  Wt 107 lb (48.535 kg)  BMI 19.57 kg/m2 Last Weight:  Wt Readings from Last 1 Encounters:  06/29/13 107 lb (48.535 kg)   Last Height:   Ht Readings from Last 1 Encounters:  06/29/13 5\' 2"  (1.575 m)    Physical exam:  General: The patient is awake, alert and appears not in acute distress. The patient is well groomed. Head: Normocephalic, atraumatic. Neck is supple. Mallampati 1 , neck circumference: 13.6  Cardiovascular:  Regular rate and rhythm, without  murmurs or carotid bruit, and without distended neck veins. Respiratory: Lungs are clear to auscultation. Skin:  Without evidence of edema, or rash Trunk: BMI  is normal, with normal  posture.  Neurologic exam : The patient is awake and alert, oriented to place and time.  Memory subjective  described as intact. There is a normal attention span & concentration ability. Speech is fluent without  dysarthria, dysphonia or aphasia. Mood and affect are appropriate.  Cranial nerves: Pupils are equal and briskly reactive to light. Funduscopic exam withoutevidence of pallor or edema. Extraocular movements  in vertical and horizontal planes intact and without nystagmus. Visual fields by finger perimetry are intact. Hearing loss -  Facial sensation intact to  fine touch. Facial motor strength is symmetric and tongue and uvula move midline.  Motor exam:  Normal tone and normal muscle bulk and symmetric normal strength in all extremities.  Sensory:  Fine touch, pinprick and vibration were tested in all extremities. Proprioception is tested in the upper extremities only. This was  normal.  Coordination: Rapid alternating movements in the fingers/hands is tested and normal. Finger-to-nose maneuver  without evidence of ataxia, dysmetria or tremor.  Gait and station: Patient walks without assistive device and is able and assisted stool climb up to the exam table. Strength within normal limits.  Stance is stable and normal. Tandem gait  unfragmented. Romberg testing is normal.  She is right handed, but feels left sided weakness, numbness , heaviness.   Deep tendon reflexes: in the  upper and lower extremities are symmetric and intact. Babinski maneuver response is  downgoing.   Assessment:  After physical and neurologic examination, review of laboratory studies, imaging, neurophysiology testing and pre-existing records, assessment will be reviewed on the problem list.  The patient had multiple paroxysmal spells the on the character but changed was the loss of the presyncopal aura that preceded the untreated events the the oro-was eliminated when she was on Tegretol.  She is now on Lamictal and Tegretol both in therapeutic doses and yet has spells of loss of consciousness she had at least 2 were generalized tonic-clonic seizures in the last 12 months. She also reports that she had been falling asleep in the classroom and was unarousable to her classmates and her professor.  Plan:  Treatment plan and additional workup will be reviewed under Problem List. Need her Neurology records from [facility] - Dr Langston Masker.  Mitochondrial disease and genetic  work up was mentioned by Ms [name],  a spinal tap was to do before her father died.    Suspected seizures, but EEG and brian  MRI were normal.  Repeat MRI brain , last one in 2011. Labs with LTG and CMZ levels.

## 2013-07-01 ENCOUNTER — Encounter: Payer: Self-pay | Admitting: Neurology

## 2013-07-01 LAB — CBC WITH DIFFERENTIAL/PLATELET
Basos: 0 % (ref 0–3)
Eosinophils Absolute: 0.1 10*3/uL (ref 0.0–0.4)
HCT: 39.9 % (ref 34.0–46.6)
Immature Grans (Abs): 0 10*3/uL (ref 0.0–0.1)
Immature Granulocytes: 0 % (ref 0–2)
Lymphocytes Absolute: 1.8 10*3/uL (ref 0.7–3.1)
MCH: 29.6 pg (ref 26.6–33.0)
MCV: 89 fL (ref 79–97)
Monocytes Absolute: 0.6 10*3/uL (ref 0.1–0.9)
Neutrophils Relative %: 56 % (ref 40–74)
RDW: 13.8 % (ref 12.3–15.4)

## 2013-07-01 LAB — COMPREHENSIVE METABOLIC PANEL
Albumin/Globulin Ratio: 1.9 (ref 1.1–2.5)
Albumin: 4.8 g/dL (ref 3.5–5.5)
Alkaline Phosphatase: 86 IU/L (ref 39–117)
BUN/Creatinine Ratio: 24 — ABNORMAL HIGH (ref 8–20)
BUN: 17 mg/dL (ref 6–20)
Creatinine, Ser: 0.7 mg/dL (ref 0.57–1.00)
GFR calc Af Amer: 141 mL/min/{1.73_m2} (ref >59)
GFR calc non Af Amer: 123 mL/min/{1.73_m2} (ref >59)
Globulin, Total: 2.5 g/dL (ref 1.5–4.5)
Total Bilirubin: 0.2 mg/dL (ref 0.0–1.2)

## 2013-07-01 NOTE — Progress Notes (Signed)
Quick Note:  Normal labs , please call T LTG and CBM are in "therapeutic range" ______

## 2013-08-10 ENCOUNTER — Telehealth: Payer: Self-pay | Admitting: Neurology

## 2013-08-11 ENCOUNTER — Telehealth: Payer: Self-pay | Admitting: *Deleted

## 2013-08-11 NOTE — Telephone Encounter (Signed)
I called and LMVM for pt or mother re: message below.

## 2013-08-11 NOTE — Telephone Encounter (Signed)
Message copied by Hermenia Fiscal on Thu Aug 11, 2013  3:58 PM ------      Message from: Seth Bake      Created: Wed Aug 10, 2013  4:40 PM       Patients mother calling again.  Left a message earlier today.  Daughter had a seizure.  Wants a call back today.   ------

## 2013-08-11 NOTE — Telephone Encounter (Signed)
Lmvm for mother to call back.

## 2013-08-12 ENCOUNTER — Other Ambulatory Visit: Payer: Self-pay | Admitting: Neurology

## 2013-08-12 DIAGNOSIS — R4789 Other speech disturbances: Secondary | ICD-10-CM

## 2013-08-12 DIAGNOSIS — G40802 Other epilepsy, not intractable, without status epilepticus: Secondary | ICD-10-CM

## 2013-08-12 MED ORDER — CARBAMAZEPINE 200 MG PO TABS: ORAL_TABLET | ORAL | Status: DC

## 2013-08-12 NOTE — Telephone Encounter (Signed)
Patient mother called to let Dohmeier now the patient had  seizure yesterday she is currently going to school  in [place].   Doctors there did a blood test and  doesn't feel patient is metabolizing the Tegretol correctly and they feel that she may need a change or adjustment in medication. Dr. Hosie Poisson has increased patient medication and sent to RX in.

## 2013-08-12 NOTE — Telephone Encounter (Signed)
Called patients mother. Will increase tegretol to 800mg  daily. [facility] [place]. Thanks.

## 2013-08-12 NOTE — Telephone Encounter (Signed)
WID:  The patient's mother has left message that the patient had a seizure on Tuesday, light triggered, while at college in [place].  The doctor there said that her Tegretol was in the therapeutic range but she may not be metabolizing correctly, so she may need a higher dose or a change in medicine.  Please advise.  The pharmacy there is [facility] [phone], mom can be reached at [phone].

## 2013-08-19 ENCOUNTER — Telehealth: Payer: Self-pay

## 2013-08-19 NOTE — Telephone Encounter (Signed)
Duplicate

## 2013-08-19 NOTE — Telephone Encounter (Signed)
I will email patient to let her know to check with [facility] regarding a change in one of her medications to 800 mg.

## 2013-08-23 NOTE — Telephone Encounter (Signed)
patient is doing well on increased dose of tegretol. Needs RV - can she come in over thankgiving ? I will be happy to see her. Please call moter and clarify.   Lazarus Salines, MD agree with assessment and management as outlined in this  note.

## 2013-09-15 ENCOUNTER — Telehealth: Payer: Self-pay | Admitting: Neurology

## 2013-09-15 ENCOUNTER — Other Ambulatory Visit: Payer: Self-pay | Admitting: Neurology

## 2013-09-15 ENCOUNTER — Emergency Department: Admit: 2013-09-16 | Payer: PRIVATE HEALTH INSURANCE

## 2013-09-15 DIAGNOSIS — G40802 Other epilepsy, not intractable, without status epilepticus: Secondary | ICD-10-CM

## 2013-09-15 DIAGNOSIS — R569 Unspecified convulsions: Principal | ICD-10-CM

## 2013-09-15 MED ORDER — LORazepam (ATIVAN) tablet 0.5 mg
1 | Freq: Once | ORAL | Status: AC
Start: 2013-09-15 — End: 2013-09-15
  Administered 2013-09-16: 04:00:00 via ORAL

## 2013-09-15 MED FILL — LORAZEPAM 1 MG TABLET: 1 1 MG | ORAL | Qty: 1

## 2013-09-15 NOTE — Unmapped (Signed)
Pt was brought in by her roommate for having several seizures and some are grand mal, some just partial: per her room mates. They state that the pt is actively having a seizure- the pt has right arm twitching, but is responding verbally during this twitching. The friends are saying that she's unresponsive- but when they asked her a question, she looked up and answered. They are stating the lighting in here is too bright and is making her have seizures.   Pt is not fully oriented, but was able to give DOB and is asking to turn the lights down.   Neuro was contacted by pt's friends and told to come here.

## 2013-09-15 NOTE — Unmapped (Signed)
ED Attending Attestation Note    Date of service:  09/15/2013    This patient was seen by the resident physician.  I have seen and examined the patient, agree with the workup, evaluation, management and diagnosis. The care plan has been discussed and I concur.     My assessment reveals a 22 y.o. female who presents for seizures.  She had a couple of seizures yesterday and was seen at [facility].  She had more seizures this afternoon.  She also complains of right shoulder pain.  On exam she is alert and can answer questions.  She has no focal weakness.  She has no deformity of her right shoulder.

## 2013-09-15 NOTE — Unmapped (Signed)
Pt transported to x-ray via stretcher.

## 2013-09-15 NOTE — Unmapped (Signed)
Pt. Moved to stretcher from wheelchair, blood sugar taken and found to be 96mg /dl.  Pt placed on monitor.

## 2013-09-15 NOTE — Unmapped (Signed)
[facility] ED Note    Date of Service: 09/15/2013    Reason for Visit: Seizures    Patient History     HPI:  This is a 22 y.o. female with history of complex partial seizures presenting for recurrent seizures.  The patient presents to the emergency department with an increase in her seizure frequency.  Per her roommate report the patient has been taking her seizure medicine as prescribed.  The patient reportedly has been having an increase in the number of her complex partial seizures as well as generalized tonic-clonic seizures.  She had a generalized tonic-clonic seizure in the ED parking lot that was witnessed by security.  She is post ictal on arrival and cannot provide any further history.    Past Medical History   Diagnosis Date   ??? Seizures        History reviewed. No pertinent past surgical history.    Katrina Bean  reports that she has never smoked. She does not have any smokeless tobacco history on file. She reports that she does not drink alcohol or use illicit drugs.    Patient's Medications   New Prescriptions    No medications on file   Previous Medications    CARBAMAZEPINE (TEGRETOL XR) 400 MG 12 HR TABLET    Take 400 mg by mouth 2 times a day.    LAMOTRIGINE (LAMICTAL) 200 MG TABLET    Take 200 mg by mouth daily.    LEVETIRACETAM (KEPPRA) 1000 MG TABLET    Take 1,000 mg by mouth 2 times a day.   Modified Medications    No medications on file   Discontinued Medications    No medications on file     Allergies:   Allergies as of 09/15/2013 - Fully Reviewed 09/15/2013   Allergen Reaction Noted   ??? Cephalosporins  09/15/2013     Review of Systems     ROS:   Unable to obtain due to AMS.    Physical Exam     ED Triage Vitals   Vital Signs Group      Temp --       Temp src --       Heart Rate 09/15/13 2216 66      Heart Rate Source --       Resp 09/15/13 2216 17      BP 09/15/13 2216 126/80 mmHg      BP Location 09/15/13 2216 Right arm      BP Method 09/15/13  2216 Automatic      Patient Position 09/15/13 2216 Sitting   SpO2 09/15/13 2216 98 %   O2 Device 09/15/13 2216 None (Room air)     General:  This is a female who appears their stated age and is solmnolent.  HEENT:  NC/AT.  PERRL.  OP clear.  MMM.  Neck:  Supple.  Trachea midline.  Pulmonary:   CTAB without adventitious sounds.  Cardiac:  RRR -m/g/r.  Abdomen:  S/NT/ND/+BS.  Musculoskeletal:  WWP with no clubbing, cyanosis, or deformities noted.  FROM of the bilateral upper and lower extremities.  Vascular:  +2 radial pulses.  Skin:  Warm and dry with no lesions noted.  Neuro:  Somnolent.  The patient will arrives with sternal rub and localizes with all 4 extremities.    Diagnostic Studies     Labs:  Results for orders placed during the hospital encounter of 09/15/13   CBC       Result Value Range  WBC 8.6  3.8 - 10.8 10E3/uL    RBC 4.42  3.80 - 5.10 10E6/uL    Hemoglobin 13.2  11.7 - 15.5 g/dL    Hematocrit 45.4  09.8 - 45.0 %    MCV 85.9  80.0 - 100.0 fL    MCH 29.7  27.0 - 33.0 pg    MCHC 34.6  32.0 - 36.0 g/dL    RDW 11.9  14.7 - 82.9 %    Platelets 329  140 - 400 10E3/uL    MPV 9.1  7.5 - 11.5 fL   DIFFERENTIAL       Result Value Range    Neutrophils Relative 54.2  40.0 - 80.0 %    Lymphocytes Relative 33.6  15.0 - 45.0 %    Monocytes Relative 9.5  0.0 - 12.0 %    Eosinophils Relative 2.0  0.0 - 8.0 %    Basophils Relative 0.7  0.0 - 1.0 %    Neutrophils Absolute 4661  1500 - 7800 /uL    Lymphocytes Absolute 2890  850 - 3900 /uL    Monocytes Absolute 817  200 - 950 /uL    Eosinophils Absolute 172  15 - 500 /uL    Basophils Absolute 60  0 - 200 /uL   BASIC METABOLIC PANEL       Result Value Range    Sodium 139  135 - 146 mmol/L    Potassium 4.2  3.5 - 5.3 mmol/L    Chloride 103  98 - 110 mmol/L    CO2 22  21 - 33 mmol/L    Anion Gap 14  3 - 16 mmol/L    BUN 12  7 - 25 mg/dL    Creatinine 5.62  1.30 - 1.20 mg/dL    Glucose 90  65 - 99 mg/dL    Calcium 8.9  8.6 - 86.5 mg/dL    GFR MDRD Af Amer 784      GFR MDRD  Non Af Amer 129      Osmolality, Calculated 287  278 - 305 mOsm/kg   URINALYSIS-MACROSCOPIC       Result Value Range    Color, UA Yellow  Yellow,Straw    Clarity, UA Clear  Clear    Specific Gravity, UA >=1.030  1.005 - 1.035    pH, UA 6.0  5.0 - 8.0    Protein, UA Negative  Negative mg/dL    Glucose, UA Negative  Negative mg/dL    Ketones, UA Negative  Negative mg/dL    Bilirubin, UA Negative  Negative    Blood, UA Trace-intact (*) Negative    Nitrite, UA Negative  Negative    Urobilinogen, UA 0.2 E.U./dL  0.2 - 1.0 EU/dL    Leukocytes, UA Negative  Negative   HCG URINE, QUALITATIVE       Result Value Range    Preg Test, Ur Negative  Negative   POC GLU MONITORING DEVICE       Result Value Range    POC Glucose Monitoring Device 96  65 - 99 mg/dL     ED Course and MDM     Katrina Bean is a 22 y.o. female with history of complex partial seizure who presented to the emergency department with seizure.  The patient was evaluated by myself and the ED attending physician.  A history and physical was performed.  Patient presents with increase in her seizure activity.  She arrives postictal but had improvement in her mental status  over the course of her ED evaluation.  We do not believe she is in status epilepticus given return to her baseline mental status eventually.  Her laboratory analysis does not demonstrate any obvious explanation for her symptoms.  We did provide the patient with Ativan 0.5 mg by mouth times one dose for seizure prophylaxis in the emergency department.  The patient did complain of some mild right shoulder pain.  She did have full range of motion of these when she returned to baseline mental status but reports a history of dislocating these areas.  We completed x-ray which did not demonstrate any signs of fracture or dislocation.  We consulted neurology evaluate the patient for her recurrent seizures.  We did evaluate the patient with a basic metabolic panel as well as to assess for any signs of  infection both of which did not demonstrate any clear etiologies of her increased seizures.  Neurology service the patient felt she was appropriate for admission to their service for continued evaluation and management.    Impression     1. Seizure disorder       Plan     1) The patient is admitted in stable condition.    Theresa Duty, MD  Resident  09/16/13 [phone]

## 2013-09-16 ENCOUNTER — Telehealth: Payer: Self-pay | Admitting: *Deleted

## 2013-09-16 ENCOUNTER — Inpatient Hospital Stay
Admission: EM | Admit: 2013-09-16 | Discharge: 2013-09-16 | Disposition: A | Discharge status: Home / Self Care | Payer: PRIVATE HEALTH INSURANCE

## 2013-09-16 ENCOUNTER — Observation Stay: Admit: 2013-09-16 | Payer: PRIVATE HEALTH INSURANCE

## 2013-09-16 LAB — CBC
Hematocrit: 38 % (ref 35.0–45.0)
Hemoglobin: 13.2 g/dL (ref 11.7–15.5)
MCH: 29.7 pg (ref 27.0–33.0)
MCHC: 34.6 g/dL (ref 32.0–36.0)
MCV: 85.9 fL (ref 80.0–100.0)
MPV: 9.1 fL (ref 7.5–11.5)
Platelets: 329 10*3/uL (ref 140–400)
RBC: 4.42 10*6/uL (ref 3.80–5.10)
RDW: 12.6 % (ref 11.0–15.0)
WBC: 8.6 10*3/uL (ref 3.8–10.8)

## 2013-09-16 LAB — DIFFERENTIAL
Basophils Absolute: 60 /uL (ref 0–200)
Basophils Relative: 0.7 % (ref 0.0–1.0)
Eosinophils Absolute: 172 /uL (ref 15–500)
Eosinophils Relative: 2 % (ref 0.0–8.0)
Lymphocytes Absolute: 2890 /uL (ref 850–3900)
Lymphocytes Relative: 33.6 % (ref 15.0–45.0)
Monocytes Absolute: 817 /uL (ref 200–950)
Monocytes Relative: 9.5 % (ref 0.0–12.0)
Neutrophils Absolute: 4661 /uL (ref 1500–7800)
Neutrophils Relative: 54.2 % (ref 40.0–80.0)

## 2013-09-16 LAB — URINE DRUG SCREEN WITHOUT CONFIRMATION, STAT
Amphetamines UR, 1000 ng/mL Cutoff: NEGATIVE
Barbiturates UR, 300  ng/mL Cutoff: NEGATIVE
Benzodiazepines UR, 300 ng/mL Cutoff: NEGATIVE
Cocaine UR, 300 ng/mL Cutoff: NEGATIVE
MDMA URINE: NEGATIVE
Methadone, UR, 300 ng/mL Cutoff: NEGATIVE
Methamph, UR, 1000 ng/mL Cutoff: NEGATIVE
Opiates UR, 300 ng/mL Cutoff: NEGATIVE
Oxycodone UR: NEGATIVE
Phencyclidine (PCP) UR, 25 ng/mL Cutoff: NEGATIVE
THC UR, 50 ng/mL Cutoff: NEGATIVE
Tricyclic Antidepressants Screen,Urine 1000 ng/mL Cutoff: NEGATIVE

## 2013-09-16 LAB — URINALYSIS, MICROSCOPIC
RBC, UA: <1 /HPF (ref 0–3)
Squam Epithel, UA: 7 /HPF (ref 0–5)
WBC, UA: 1 /HPF (ref 0–5)

## 2013-09-16 LAB — URINALYSIS-MACROSCOPIC W/REFLEX TO MICROSCOPIC
Bilirubin, UA: NEGATIVE
Glucose, UA: NEGATIVE mg/dL
Ketones, UA: NEGATIVE mg/dL
Leukocytes, UA: NEGATIVE
Nitrite, UA: NEGATIVE
Protein, UA: NEGATIVE mg/dL
Specific Gravity, UA: >=1.03 (ref 1.005–1.035)
Urobilinogen, UA: 0.2 EU/dL (ref 0.2–1.0)
pH, UA: 6 (ref 5.0–8.0)

## 2013-09-16 LAB — HCG URINE, QUALITATIVE: Preg Test, Ur: NEGATIVE

## 2013-09-16 LAB — BASIC METABOLIC PANEL
Anion Gap: 14 mmol/L (ref 3–16)
BUN: 12 mg/dL (ref 7–25)
CO2: 22 mmol/L (ref 21–33)
Calcium: 8.9 mg/dL (ref 8.6–10.2)
Chloride: 103 mmol/L (ref 98–110)
Creatinine: 0.58 mg/dL (ref 0.50–1.20)
GFR MDRD Af Amer: 156 See note.
GFR MDRD Non Af Amer: 129 See note.
Glucose: 90 mg/dL (ref 65–99)
Osmolality, Calculated: 287 mOsm/kg (ref 278–305)
Potassium: 4.2 mmol/L (ref 3.5–5.3)
Sodium: 139 mmol/L (ref 135–146)

## 2013-09-16 LAB — LAMOTRIGINE LEVEL: Lamotrigine Lvl: NOT DETECTED ug/mL (ref 2.0–20.0)

## 2013-09-16 LAB — CARBAMAZEPINE LEVEL, TOTAL: Carbamazepine Lvl: <3 mg/L (ref 4.0–12.0)

## 2013-09-16 LAB — LEVETIRACETAM LEVEL: Levetiracetam Lvl: NOT DETECTED ug/mL (ref 5.0–63.0)

## 2013-09-16 LAB — POC GLU MONITORING DEVICE: POC Glucose Monitoring Device: 96 mg/dL (ref 65–99)

## 2013-09-16 MED ORDER — levETIRAcetam (KEPPRA) tablet 1,000 mg
500 | Freq: Once | ORAL | Status: AC
Start: 2013-09-16 — End: 2013-09-16
  Administered 2013-09-16: 08:00:00 via ORAL

## 2013-09-16 MED ORDER — LORazepam (ATIVAN) 0.5 MG tablet
0.5 | ORAL_TABLET | Freq: Two times a day (BID) | ORAL | Status: AC
Start: 2013-09-16 — End: 2013-09-19

## 2013-09-16 MED ORDER — levETIRAcetam (KEPPRA) 750 MG tablet
750 | ORAL_TABLET | Freq: Two times a day (BID) | ORAL | Status: AC
Start: 2013-09-16 — End: ?

## 2013-09-16 MED ORDER — acetaminophen (TYLENOL) tablet 650 mg
325 | Freq: Four times a day (QID) | ORAL | Status: AC | PRN
Start: 2013-09-16 — End: 2013-09-16

## 2013-09-16 MED ORDER — carBAMazepine (TEGRETOL XR) 200 MG 12 hr tablet
200 | ORAL_TABLET | ORAL | Status: AC
Start: 2013-09-16 — End: 2013-09-16

## 2013-09-16 MED ORDER — lamoTRIgine (LAMICTAL) tablet 200 mg
200 | Freq: Every day | ORAL | Status: AC
Start: 2013-09-16 — End: 2013-09-16
  Administered 2013-09-16: 12:00:00 via ORAL

## 2013-09-16 MED ORDER — lamoTRIgine (LAMICTAL) 200 MG tablet
200 | ORAL_TABLET | Freq: Two times a day (BID) | ORAL | 1.00 refills | 30.00000 days | Status: AC
Start: 2013-09-16 — End: ?

## 2013-09-16 MED ORDER — carBAMazepine (TEGRETOL XR) 12 hr tablet 400 mg
400 | Freq: Three times a day (TID) | ORAL | Status: AC
Start: 2013-09-16 — End: 2013-09-16
  Administered 2013-09-16 (×2): via ORAL

## 2013-09-16 MED ORDER — ondansetron (ZOFRAN) 4 mg/2 mL injection 4 mg
4 | Freq: Four times a day (QID) | INTRAMUSCULAR | Status: AC | PRN
Start: 2013-09-16 — End: 2013-09-16

## 2013-09-16 MED ORDER — ondansetron (ZOFRAN) 4 mg/2 mL injection 8 mg
4 | Freq: Four times a day (QID) | INTRAMUSCULAR | Status: AC | PRN
Start: 2013-09-16 — End: 2013-09-16

## 2013-09-16 MED ORDER — lamoTRIgine (LAMICTAL) 200 MG tablet
200 | ORAL_TABLET | Freq: Two times a day (BID) | ORAL | 1.00 refills | 30.00000 days | Status: AC
Start: 2013-09-16 — End: 2013-09-16

## 2013-09-16 MED ORDER — LORazepam (ATIVAN) tablet 0.5 mg
1 | Freq: Two times a day (BID) | ORAL | Status: AC
Start: 2013-09-16 — End: 2013-09-16

## 2013-09-16 MED ORDER — LORazepam (ATIVAN) 1 MG tablet
1 | ORAL | Status: AC
Start: 2013-09-16 — End: 2013-09-16
  Administered 2013-09-16: 12:00:00 via ORAL

## 2013-09-16 MED ORDER — heparin (porcine) injection 5,000 Units
5000 | Freq: Three times a day (TID) | INTRAMUSCULAR | Status: AC
Start: 2013-09-16 — End: 2013-09-16
  Administered 2013-09-16: 08:00:00 via SUBCUTANEOUS

## 2013-09-16 MED ORDER — LORazepam (ATIVAN) 0.5 MG tablet
0.5 | ORAL_TABLET | Freq: Two times a day (BID) | ORAL | Status: AC
Start: 2013-09-16 — End: 2013-09-16

## 2013-09-16 MED ORDER — carBAMazepine (TEGRETOL XR) 12 hr tablet 400 mg
400 | Freq: Two times a day (BID) | ORAL | Status: AC
Start: 2013-09-16 — End: 2013-09-16

## 2013-09-16 MED ORDER — levETIRAcetam (KEPPRA) tablet 1,500 mg
750 | Freq: Two times a day (BID) | ORAL | Status: AC
Start: 2013-09-16 — End: 2013-09-16
  Administered 2013-09-16 (×2): via ORAL

## 2013-09-16 MED ORDER — carBAMazepine (TEGRETOL XR) 200 MG 12 hr tablet
200 | ORAL_TABLET | ORAL | Status: AC
Start: 2013-09-16 — End: ?

## 2013-09-16 MED FILL — LEVETIRACETAM 750 MG TABLET: 750 750 MG | ORAL | Qty: 2

## 2013-09-16 MED FILL — TEGRETOL XR 400 MG TABLET,EXTENDED RELEASE: 400 400 mg | ORAL | Qty: 1

## 2013-09-16 MED FILL — LEVETIRACETAM 500 MG TABLET: 500 500 MG | ORAL | Qty: 2

## 2013-09-16 MED FILL — LAMICTAL 200 MG TABLET: 200 200 mg | ORAL | Qty: 1

## 2013-09-16 MED FILL — HEPARIN (PORCINE) 5,000 UNIT/ML INJECTION SOLUTION: 5000 5,000 unit/mL | INTRAMUSCULAR | Qty: 1

## 2013-09-16 MED FILL — LORAZEPAM 1 MG TABLET: 1 1 MG | ORAL | Qty: 1

## 2013-09-16 NOTE — Telephone Encounter (Signed)
Dr. Vickey Huger spoke to Dr. Verl Bangs in ED re: pt with seizures.  Medical records faxed to [phone].  ([facility]- last note, MRI, EEG, EPIC last note, labs).  Confirmation received.

## 2013-09-16 NOTE — Telephone Encounter (Signed)
Referral submitted. Attempted to contact patient and parent to get specific location. Learned pt in ER in [place]. Additionally, SY faxed recent medical records to the [place] ER along with e-mail request from patient for referral to Neurology & neuro diagnostic

## 2013-09-16 NOTE — Unmapped (Signed)
[facility]  Neurology Department    Neurology H&P    CC: increased frequency of seizures  HPI: Katrina Bean is a 22 y.o. 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   She was diagnosed with epilepsy when she was 22 yrs old after a grand mal seizure. She has complex partial seizures with secondary generalization. Triggers are fluorescent lights, sleep deprivation, and stress. Semiology of CPS consists of Rt arm shaking, stiffening and staring spells, usually lasting 30-60 seconds, with short term memory loss, occasional tongue biting, no urinary incontinence. No aura. Postictal confusion for several minutes. Frequency is 1 every 3 weeks.   Her GTCs usually happen 2 times/year. She has auras consisting of a strange smell, then would have whole body convulsions. These last 1 minute or less. She sleeps for 30-40 minutes afterward.   Her neurologist is in [place], and he diagnosed her with a mitochondrial disorder based on her various symptoms including seizures, muscle weakness, and numbness. She says she had a test done that was positive for mitochondrial abnormality, but she does not know what kind. She has had a 24-hr EEG done at the time of diagnosis. She was started on tegretol when she was 89, then lamictal was added at age 2, and keppra at age 34. Her current AED regimen consists of Tegretol 400mg  BID, Lamictal 200mg  Qdaily, and Keppra 1g BID.  Recently she has been having more seizures than usual. She moved to [place] in August to attend graduate school at [employer]. Since then, she has been having about 4 CPS per week, which roommate states comes in clusters of several a day. Her arm would shake and stiffen, and she would become unresponsive for seconds. She would be spacey between these spells, but is able to text her roommate that she has had a seizure. She called her neurologist in [place] about the increased frequency of her seizures, and was recommended to increase her tegretol from BID to TID, which she did 2 weeks ago. When her seizures were still not well controlled, he told her to take it 5 times a day.   Still, here seizures were not well controlled and she had 3 GTCs in the past 2 days. She went to [facility] in [place] yesterday after a GTC, and her tegretol level was low at 0.7 despite the increased dose. She called her neurologist and was instructed to go to [facility] ED. She denies any recent illness, infection, or change in medication other than the tegretol. She has no h/o head trauma, febrile seizures, meningitis, or family h/o seizures.       PMHx:  has a past medical history of Seizures.  PSHx:  has no past surgical history on file.  Social Hx:  History   Substance Use Topics   ??? Smoking status: Never Smoker    ??? Smokeless tobacco: Not on file   ??? Alcohol Use: No     [id number] history is not on file.    Allergies: Cephalosporins    Medications:   Prior to Admission medications    Medication Sig Start Date End Date Taking? Authorizing Provider   carBAMazepine (TEGRETOL XR) 400 MG 12 hr tablet Take 400 mg by mouth 2 times a day.   Yes Historical Provider, MD   lamoTRIgine (LAMICTAL) 200 MG tablet Take 200 mg by mouth daily.   Yes Historical Provider, MD   levETIRAcetam (  KEPPRA) 1000 MG tablet Take 1,000 mg by mouth 2 times a day.   Yes Historical Provider, MD          Review of Systems   Constitutional: Negative for fever, chills.   HENT: Negative for ear pain, sore throat, visual disturbance.   Respiratory: Negative for cough, shortness of breath.    Cardiovascular: Negative for chest pain, palpitations.   Gastrointestinal: Negative for nausea, vomiting, abdominal pain, diarrhea.   Genitourinary: Negative for dysuria.     Physical Exam  OBJECTIVE  Blood pressure 115/69, pulse 73, resp. rate 11, SpO2 100.00%.  Heart Rate:  [66-76] 73  Resp:  [11-21] 11  BP:  (113-126)/(60-80) 115/69 mmHg  There is no height or weight on file to calculate BMI.      Neurologic Exam:   Mental status  Alert, oriented to time, place, person, situation.   Language normal. Comprehension, expression, repetition normal.   Attention: able to say days of week backward, but slow.    CN   II Pupil equal round and reactive bilaterally. Visual fields intact   III, IV, VI EOM intact without nystagmus   V Facial sensation symmetric to light touch   VII Face symmetric on baseline and activation   VIII Hearing intact   IX, X Palate rise symmetric, uvula midline   XI Shoulder shrug symmetric   XII Tongue in midline    Motor R/L  Deltoid: at least 4/4, exam limited by pain in her shoulders (prior dislocation after GTC)                   Biceps: 5/ at least 4 (limited by pain)   Triceps 5/ at least 4 (limited by pain)   Wrist flexor 5/5  Wrist extensor 5/5  Interossei  5/5    Hip flexor 5/5  Knee extensor 5/5  Knee flexor 5/5  Ankle Dorsi flexor 5/5  Ankle Plantar flexor 5/5    Drift on left without pronation  No tremors  Tone normal    Sensory  Light touch, Pin prick intact and symmetric in UE, decreased on Lt (80%) compared to Rt on LE.  Bilateral decreased sensitivity to pinprick in feet compared to lower legs.     DTR   Biceps 2/2  Brachioradialis 2/2  Triceps 2/2  Quadriceps 2/2  Ankle 2/2  Toes down/mute    Cerebellar  Finger to nose intact without dysmetria or tremor  Heel to shin intact    Gait  Deferred        Labs  Lab Results   Component Value Date    GLUCOSE 90 09/15/2013    BUN 12 09/15/2013    CO2 22 09/15/2013    CREATININE 0.58 09/15/2013    K 4.2 09/15/2013    NA 139 09/15/2013    CL 103 09/15/2013    CALCIUM 8.9 09/15/2013        Lab Results   Component Value Date    WBC 8.6 09/15/2013    HGB 13.2 09/15/2013    HCT 38.0 09/15/2013    MCV 85.9 09/15/2013    PLT 329 09/15/2013        Lab Results   Component Value Date    COLORU Yellow 09/16/2013    CLARITYU Clear 09/16/2013    PROTEINUA Negative 09/16/2013     PHUR 6.0 09/16/2013    LABSPEC >=1.030 09/16/2013    GLUCOSEU Negative 09/16/2013    BLOODU Trace-intact* 09/16/2013    LEUKOCYTESUR Negative  09/16/2013    NITRITE Negative 09/16/2013    BILIRUBINUR Negative 09/16/2013    UROBILINOGEN 0.2 E.U./dL 16/12/958    RBCUA <1 /HPF 09/16/2013    WBCUA 1 09/16/2013     UDS negative  U preg test negative    Carbamazepine level  <3 (L)  Lamotrigine level  Pending  Levetiracetam level  Pending       Imaging  CXR PA Lat 09/16/2013  IMPRESSION:   Normal chest.        ASSESSMENT and PLAN:   Katrina Bean is a 22 y.o. year old female with epilepsy and ?mitochondrial d/o who presents with increased frequency of seizures.    # Increased frequency of CPS and GTCs  - timing suggests that moving from [place] to [facility] and being in a new environment may have been trigger, otherwise unclear why she has increased frequency. We do not have all the information leading up to her current epilepsy management, which would help with getting her sz under control. She endorses compliance with medications and no change in state of health.    - will admit to obs for pt's safety, as there is a risk of her having another GTC without adequate supervision (lives in a dorm with friends)   - check infectious w/u, UA, CXR, UDS  - tegretol, lamictal, keppra levels  - will need to talk to primary neurologist at [facility], obtain records  - set up outpatient care with epileptologist  - will order tegretol 400 TID, lamictal 200 Qdaily, and keppra 1500 BID (increase from 1000 BID) for now, likely adjust in the AM after talking to primary neurologist and getting results on levels.  - ativan bridge 0.5mg  BID       Chad Cordial, MD  Neurology PGY 2  09/16/2013  3:32 AM      Neurology Consult Attending Note:  On this day I saw, examined, and was physically present for the key portions of the services provided. I have reviewed and agree with Dr. Tobie Lords note except as documented below. History and exam as per prior note except as documented  below.  Key parts of the history, physical exam and medical decision making were made by me.    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  Previous freq 1 every 3 weeks, rare GTCs.  On CBZ, LTG, LEV.  From [place], moved here in August.  Has been having clusters of seizures.  CBZ increased two weeks ago, then increased further a few days ago.  Also had 3 GTCs in last 2 days.  CBZ level <3.0.  Dislocated her left shoulder with a GTC.  Her [place] neurologist recommended she come to [facility] ED.    Per the patient, there was concern for mitochondrial disorder. 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  She has been seen by several neurologists previously.    EXAM: See HO exam above.  She has mild dysarthric speech.  Giveway weakness in the UE and LE, although does have dislocation of left shoulder and pain limitation.  Able to walk in the ER, some difficulty with tandem.     Imaging: she has had imaging previously which she states was normal.    IMPRESSION:   Katrina Bean is a 22 y.o. female who  has a past medical history of Seizures. who presented with increased seizure frequency.  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    RECOMMENDATIONS:  ?? Increase LEV to 1500 bid  ?? Not clear how effective CBZ is if level is <3.0 and she is actually taking it as prescribed at 5x/day. I would recommend a slow taper of this to off.  ?? Continue LTG 200 bid (we called her pharmacy and this is the dose she is taking at home).  ?? Ativan bridge  ?? Needs followup with epileptology and probable EMU admission in the future to characterize seizure and rule out PNES    This is a high complexity case because of:  ?? One or more chronic illnesses with severe exacerbation, progression or adverse effect of treatment: as above  ?? Acute or chronic illness that poses a threat to life  or body functions: as above  ?? Medical tests or procedures required: workup/management as above    Thank you for the opportunity to participate in the care of this patient.    Toney Rakes, MD  Associate Professor of Neurology  Pager [phone]  September 16, 2013 10:15 AM

## 2013-09-16 NOTE — Unmapped (Signed)
Urine collected all three tubes sent to lab

## 2013-09-16 NOTE — Unmapped (Signed)
Report received from previous RN. Patient resting in bed with friend. No signs of seizure activity. Respirations regular, non-labored. Work Nurse, mental health at bedside. Patient on cardiac monitor. Will monitor.

## 2013-09-16 NOTE — Unmapped (Signed)
Tegretol wean schedule:  400mg  PO BID for one week  Then 200mg  PO BID for one week  Then 200mg  QHS for one week  Then stop  Driving and Equipment Restrictions  Some medical problems make it dangerous to drive, ride a bike, or use machines. Some of these problems are:  ?? A hard blow to the head (concussion).  ?? Passing out (fainting).  ?? Twitching and shaking (seizures).  ?? Low blood sugar.  ?? Taking medicine to help you relax (sedatives).  ?? Taking pain medicines.  ?? Wearing an eye patch.  ?? Wearing splints. This can make it hard to use parts of your body that you need to drive safely.  HOME CARE   ?? Do not drive until your doctor says it is okay.  ?? Do not use machines until your doctor says it is okay.  You may need a form signed by your doctor (medical release) before you can drive again. You may also need this form before you do other tasks where you need to be fully alert.  MAKE SURE YOU:  ?? Understand these instructions.  ?? Will watch your condition.  ?? Will get help right away if you are not doing well or get worse.  Document Released: 01/08/2005 Document Revised: 02/23/2012 Document Reviewed: 04/10/2010  ExitCare?? Patient Information ??2014 [place], [place].

## 2013-09-16 NOTE — Unmapped (Signed)
[facility]  Inpatient Discharge Summary     Patient: Katrina Bean  Age: 22 y.o.    MRN: [id number]   CSN: [id number]    Date of Admission: 09/15/2013  Date of Discharge: 09/16/2013  Attending Physician: Toney Rakes, MD   Primary Care Physician: Non Staff Referal Provider Gen     Diagnoses Present on Admission     Past Medical History   Diagnosis Date   ??? Seizures         Discharge Diagnoses     Active Hospital Problems    Diagnosis Date Noted   ??? Seizure [780.39] 09/16/2013      Resolved Hospital Problems    Diagnosis Date Noted Date Resolved   No resolved problems to display.       Operations/Procedures Performed (include dates)     Surgeries:      Lines and tubes:  Active Line / PIV Line    Name:   Placement date:   Placement time:   Site:   Days:    Peripheral IV 09/15/13 Left Antecubital  09/15/13      Antecubital   1          Other Procedures / Pertinent Imaging:  CXR, 09/16/2013:  Lungs are clear. Heart size is normal. No visualized osseous abnormality.   IMPRESSION:   Normal chest.    Right Shoulder xray, 09/15/2013:  No fracture or dislocation. The humeral head contour is normal. The glenohumeral joint and acromioclavicular joint are maintained.   Impression:   No acute osseous findings within the right shoulder.    Consulting Services (include reason)         Allergies     Allergies   Allergen Reactions   ??? Cephalosporins        Discharge Medications     Current Discharge Medication List      START taking these medications    Details   LORazepam (ATIVAN) 0.5 MG tablet Take 1 tablet (0.5 mg total) by mouth 2 times a day.  Qty: 6 tablet, Refills: 0         CONTINUE these medications which have CHANGED    Details   carBAMazepine (TEGRETOL XR) 200 MG 12 hr tablet 400mg  PO BID for one week  Then 200mg  PO BID for one week  Then 200mg  QHS for one week  Then stop  Qty: 60 tablet, Refills: 0      lamoTRIgine (LAMICTAL) 200 MG tablet Take 1 tablet (200 mg total) by mouth 2 times a day.  Qty: 60 tablet, Refills: 2         levETIRAcetam (KEPPRA) 750 MG tablet Take 2 tablets (1,500 mg total) by mouth 2 times a day.  Qty: 120 tablet, Refills: 2             Discharge Exam     PHYSICAL EXAM    PE:  Blood pressure 104/79, pulse 71, temperature 97.8 ??F (36.6 ??C), temperature source Oral, resp. rate 18, SpO2 97.00%.       General Appearance: Awake in bed. NAD.  Pulm: Lungs clear to auscultation bilaterally  CV: S1/S2  GI: Non-tender, non-distended  Extr: No clubbing, cyanosis or edema  Mental Status: Alertness:alert and Orientation:oriented to person, place, time, situation  CN II: disc margin sharp and clear: no papilledema or hemorrhage and visual field to confrontation  CN III/IV/VI: Extraocular movements intact  and Pupils equal, round, reactive to light with accommodation  CN V: Facial sensation normal in VI, VII,  VII  CN VII: facial motion intact and symmetric  CN VIII: Hearing intact to finger rub  CN IX/X: palate elevation symmetric bilaterally  CN XI: trapezius 5/5 symmetric strenghth  CN XII: Tongue protrudes midline  Motor:   Giveway weakness.  Right deltoid strength exam limited by pain   R  L    Deltoid 4 5   Biceps 5 5   Triceps 5 5   Wrist extension  5 5   Interossei 5 5      R  L    Hip flexion  5  5   Hip extension  5 5   Knee flexion  5 5   Knee extension  5 5   Ankle dorsiflexion  5 5   Ankle plantar flexion  5 5       Sensory:light touch intact bilaterally upper and lower extremities  -Subjective numbness to left lower leg  Coord: no dysmetria or tremor noted and finger nose finger  normal bilateral  Gait: normal gait and normal tandem gait  Deep Tendon Reflexes: toes: up left     R  L    Biceps  2  2    Triceps  2  2    Brachioradialis  2  2    Patellar  2  2    Achilles  2  2    Toes down down       Clonus: normal  Dysarthia:No evidence of Dysarthia  Additional Exam:          Reason for Admission     Katrina Bean is a 22 y.o. female with PMH of epilepsy who presented with increase in seizure frequency.      Hospital  Course     Active Hospital Problems    Diagnosis Date Noted   ??? Seizure [780.39] 09/16/2013      Resolved Hospital Problems    Diagnosis Date Noted Date Resolved   No resolved problems to display.     1.) Seizures  Semiology 1: Staring spells, unresponsive.  Last 30-60 seconds. Confusion afterwards.    Semiology 2: Right arm jerking.  Sometimes responsive, sometimes not.    Semiology 3: Generalized body stiffening that progresses to tonic clonic movements.  Has tongue/cheek biting. Occasional urinary incontinence.  Post-ictal confusion and somnolence.  Happen rarely.    Triggers: florescent lights, flashing lights, moving lights, sleep deprivation     Infectious workup including CXR, CBC, UA negative.  Afebrile.  Denies any recent illness.  Patient believes trigger is constantly being under florescent lights at school. Talked to patient's neurologist in [place].  Katrina Bean has had ambulatory eeg monitoring for 24 hours which showed assymetry but no electrographic seizures.  Dr. 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  Home AED regimen was: keppra 1000mg  PO BID, lamictal 200mg  PO BID, and tegretol er 400mg  PO TID.      Will keep lamictal at 200mg  PO BID.  Keppra increased to 1500mg  PO BID.  Wean tegretol to 400mg  PO BID for one week, then 200mg  PO BID for one week, then 200 QHS for one week and stop.  0.5mg  PO ativan BID for three days as ativan bridge. Follow-up appointment scheduled with Dr. Liana Crocker on October 25, 2013 at 11:10am.  Referral made to EMU as she has not had an EMU admission in the past.  Need to determine if these episodes are all epileptic in nature as she is continuing to  have multiple episodes despite triple AED therapy.        Seizure restrictions/precautions were reviewed.  Based on state laws, the patient may not drive for 3 months after the most recent event involving loss of consciousness.  The patient should not swim or bathe alone,  should not work in high places or with heavy Environmental education officer.  The patient verbalized understanding.      Condition on Discharge     1. Functional Status: mildly impaired   Describe limitations, if any:     2. Mental Status: Alert/Oriented   Describe limitations, if any:     3. Dietary Restrictions / Tube Feeding / TPN  Diet Orders         Diet regular starting at 10/03 0234        Regular Diet    4. Discharge specific orders:   None required    5. Core measures followed: (if this is a core measure patient)  Discharge            Disposition     Home with supervision      Follow-Up Appointments     Future Appointments  Date Time Provider Department Center   10/25/2013 11:10 AM Maryanna Shape, MD [facility]     No follow-up provider specified.    Signed:    Kenzlee Fishburn, CNP  09/16/2013, 12:00 PM

## 2013-09-16 NOTE — Unmapped (Signed)
Pt resting in room on bedside monitor with family at bedside. Patient updated on plan of care and informed of any delays.  Call light is in reach and bed rails up for safety.  No concerns voiced at this time. The patient was advised to use call light if any concerns or questions. Pt remains in seizure precautions. Respiration equal/unlabored. No signs of respiratory distress at this time.

## 2013-09-16 NOTE — Unmapped (Signed)
Neuro at bedside.

## 2013-09-16 NOTE — Unmapped (Signed)
Pt resting with eyes closed. Friends at bedside. Will continue to monitor.

## 2013-10-14 NOTE — Unmapped (Signed)
Have called patient 3 times and left message each time to schedule patient into EMU, as this date have not received a return call from patient.

## 2013-10-25 ENCOUNTER — Ambulatory Visit: Payer: PRIVATE HEALTH INSURANCE | Attending: Neurology

## 2013-12-01 ENCOUNTER — Ambulatory Visit: Payer: BC Managed Care – PPO | Admitting: Neurology

## 2014-03-06 ENCOUNTER — Telehealth: Payer: Self-pay | Admitting: Neurology

## 2014-03-06 NOTE — Telephone Encounter (Signed)
Renee w/[facility] in [place] called regarding the Pt's Tegretol prescription.  On the prescription it states: Sig: XR- 12H-TABLET 200MG . Take 2 tablets (400mg  total) twice a day. And when it was dispensed it was filled for just regular Tegretol.  They need to know if they need to dispense the XR version or the regular version when they refill the prescription.  Please call to advise.  Thank you

## 2014-03-06 NOTE — Telephone Encounter (Signed)
Patient's Tegretol dose was increased in Sept to 400mg  XR BID, apparently the pharmacy has been dispensing regular release in error.  They would like to know if they should start filling XR, or continue to fill regular release.  I tried to call patient at home and on cell, got no answer on either line.   Dr Brett Fairy is out of the office.  (I will be happy to call the pharmacy back, just want to clarify what they need to dispense going forward due to error.)   WID please advise.  Thank you.

## 2014-03-06 NOTE — Telephone Encounter (Signed)
I called back.  Spoke with Ignatius Specking.  She said they will dispense XR and do a report on their side since they had given regular in error.

## 2014-03-06 NOTE — Telephone Encounter (Signed)
pls ask pharmacy to fill Tegretol-XR 200 mg 2 bid as per Dr. Edwena Felty recs. thx  Michel Bickers

## 2014-06-12 ENCOUNTER — Encounter (INDEPENDENT_AMBULATORY_CARE_PROVIDER_SITE_OTHER): Payer: Self-pay

## 2014-06-12 ENCOUNTER — Ambulatory Visit (INDEPENDENT_AMBULATORY_CARE_PROVIDER_SITE_OTHER): Payer: BC Managed Care – PPO | Admitting: Neurology

## 2014-06-12 ENCOUNTER — Encounter: Payer: Self-pay | Admitting: Neurology

## 2014-06-12 VITALS — BP 123/81 | HR 71 | Resp 18 | Ht 64.5 in

## 2014-06-12 DIAGNOSIS — G43909 Migraine, unspecified, not intractable, without status migrainosus: Secondary | ICD-10-CM

## 2014-06-12 DIAGNOSIS — G40802 Other epilepsy, not intractable, without status epilepticus: Secondary | ICD-10-CM

## 2014-06-12 DIAGNOSIS — E162 Hypoglycemia, unspecified: Secondary | ICD-10-CM

## 2014-06-12 DIAGNOSIS — R4789 Other speech disturbances: Secondary | ICD-10-CM

## 2014-06-12 HISTORY — DX: Hypoglycemia, unspecified: E16.2

## 2014-06-12 MED ORDER — LAMOTRIGINE ER 200 MG PO TB24: 1.0000 | ORAL_TABLET | Freq: Two times a day (BID) | ORAL | Status: DC

## 2014-06-12 MED ORDER — ALPRAZOLAM 0.25 MG PO TABS: 0.2500 mg | ORAL_TABLET | Freq: Every evening | ORAL | Status: DC | PRN

## 2014-06-12 MED ORDER — LAMOTRIGINE 200 MG PO TABS: 200.0000 mg | ORAL_TABLET | Freq: Two times a day (BID) | ORAL | Status: DC

## 2014-06-12 MED ORDER — LEVETIRACETAM 500 MG PO TABS: 500.0000 mg | ORAL_TABLET | Freq: Three times a day (TID) | ORAL | Status: DC

## 2014-06-12 NOTE — Progress Notes (Signed)
[facility]  Provider:  Dr [name] Referring Provider: Lisabeth Pick, MD Primary Care Physician:  Annette Hurter, MD  Chief Complaint  Patient presents with  . Follow-up    Room 11  . Discuss medication    HPI:  [name] is a 23 y.o. female here as a referral from Annette. Lynnette [name] , [facility] neurological department.   [name] is an established patient with [facility]- with a peculiar history of seizures or other paroxysmal spells. She lost her father to sudden cardiac arrest in 2013, while running.  I have followed [name] since the year 2010 at the time the patient had reported multiple paroxysmal spells but appeared to be accompanied by a or rub of near-syncope. Annette. Leanne Chang primary care physician had announced her and made her appointment for her in the meanwhile she had 2 spells that were described as calm bolstering generalized seizures she had bitten her bugle tissue she had become incontinent.  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 During the spell her eyes stayed open but she was unable to focus or contact the patient had not driving license at that time and thus privileges and didn't need to be revoked.  She also was at [facility] seen in the Internal Medicine Department , where a vitamin D deficiency and low enzyme Q10 were identified and recommended to be taken as supplements. She felt less fatigued and less malaise and had less joint .  We added Lamictal to the tegretol.    The patient has a over 80% hearing loss she has reported that her handwriting has become "untidy" , she of has dropped objects mostly with the left hand.  She does not have dysarthria , her speech has not changed.   [name] , brings her seizure - therapy dog , a certified service animal, to her appointment today. Her  Current  College in [place] is much to her liking.   She plans to go to [employer] for her PhD.  Her sister is [name] this year.   The patent has had less frequent spells , but still has 2 "big" seizures a year on two antiepileptic medication. She noted a sensitivity to flashing lights. 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 She gets headaches and feels nauseated. She is not driving.       Review of Systems: Out of a complete 14 system review, the patient complains of only the following symptoms, and all other reviewed systems are negative. Seizures paroxysmal  Spells. Injuries during falls in  LOC episodes.   History   Social History  . Marital Status: Single    Spouse Name: N/A    Number of Children: 0  . Years of Education: college   Occupational History  . Not on file.   Social History Main Topics  . Smoking status: Never Smoker   . Smokeless tobacco: Never Used  . Alcohol Use: No  . Drug Use: No  . Sexual Activity: Not on file   Other Topics Concern  . Not on file   Social History Narrative   Patient is single and lives at home with her parents when not in [name].   Patient is currently attending [name].   Patient right-handed.  Patient does not drink any caffeine.    Family History  Problem Relation Age of Onset  . Polymyalgia rheumatica Mother   . Heart disease Maternal Grandmother   . Cancer Maternal Grandmother   . Heart disease Maternal Grandfather   . Stroke Maternal Grandfather   . Diabetes Maternal Grandfather   . COPD Maternal Grandfather   . Kidney failure Maternal Grandfather   . Heart disease Paternal Grandmother   . Heart disease Paternal Grandfather   . Migraines Paternal Grandfather     Past Medical History  Diagnosis Date  . Seizure disorder 2010  . Hearing loss   . Muscle weakness of lower extremity     b/l  . Shoulder subluxation, left   .  Seizures   . Migraine headache   . Deafness     progressive  . Mononucleosis 2009    no lymphatic symptoms  . Fatigue   . History of sprained wrist     Past Surgical History  Procedure Laterality Date  . Laparoscopic appendectomy  11/26/2011    Procedure: APPENDECTOMY LAPAROSCOPIC;  Surgeon: Gayland Curry, MD;  Location: [facility];  Service: General;  Laterality: N/A;  . Ovarian cyst removal  2/11    2 removed    Current Outpatient Prescriptions  Medication Sig Dispense Refill  . ALPRAZolam (XANAX) 0.25 MG tablet Take 1 tablet (0.25 mg total) by mouth at bedtime as needed for sleep.  30 tablet  0  . carbamazepine (TEGRETOL) 200 MG tablet XR- 12H-TABLET 200MG . Take 2 tablets (400mg  total) twice a day  360 tablet  3  . CO ENZYME Q-10 PO Take by mouth 2 (two) times daily.      . LamoTRIgine XR (LAMICTAL XR) 200 MG TB24 Take 1 tablet (200 mg total) by mouth 2 (two) times daily.  180 tablet  3  . levETIRAcetam (KEPPRA) 500 MG tablet Take 500 mg by mouth 3 (three) times daily.       No current facility-administered medications for this visit.    Allergies as of 06/12/2014 - Review Complete 06/12/2014  Allergen Reaction Noted  . Shellfish allergy  11/26/2011  . Cefuroxime axetil      Vitals: BP 123/81  Pulse 71  Resp 18  Ht 5' 4.5" (1.638 m) Last Weight:  Wt Readings from Last 1 Encounters:  06/29/13 107 lb (48.535 kg)   Last Height:   Ht Readings from Last 1 Encounters:  06/12/14 5' 4.5" (1.638 m)    Physical exam:  General: The patient is awake, alert and appears not in acute distress. The patient is well groomed. Head: Normocephalic, atraumatic. Neck is supple. Mallampati 1 , neck circumference: 13.5 inches. No TMJ. No goiter.   Cardiovascular:  Regular rate and rhythm, without  murmurs or carotid bruit, and without distended neck veins. Respiratory: Lungs are clear to auscultation. Skin:  Without evidence of edema, or rash Trunk: BMI is normal, with normal   posture.  Neurologic exam : The patient is awake and alert, oriented to place and time.   Memory subjective  described as intact. There is a normal attention span & concentration ability.  Speech is fluent without  dysarthria, dysphonia or aphasia. Mood and affect are appropriate.  Cranial nerves: Pupils are equal and briskly reactive to light. Funduscopic exam withoutevidence of pallor or edema.  Extraocular movements  in vertical and horizontal planes intact and without nystagmus. Visual fields by finger perimetry are intact. Hearing loss -  Facial sensation intact to fine  touch. Facial motor strength is symmetric and tongue and uvula move midline.  Motor exam:  Normal tone and normal muscle bulk and symmetric normal strength in all extremities.  Sensory:  Fine touch, pinprick and vibration were tested in all extremities. Proprioception is tested in the upper extremities only. This was  normal.  Coordination: Rapid alternating movements in the fingers/hands is tested and normal. Finger-to-nose maneuver  without evidence of ataxia, dysmetria or tremor.  Gait and station: Patient walks without assistive device and is able and assisted stool climb up to the exam table. Strength within normal limits.  Stance is stable and normal. Tandem gait  unfragmented. Romberg testing is normal.  She is right handed, but feels left sided weakness, numbness , heaviness.   Deep tendon reflexes: in the  upper and lower extremities are symmetric and intact. Babinski maneuver response is  downgoing.   Assessment:  After physical and neurologic examination, review of laboratory studies, imaging, neurophysiology testing and pre-existing records, assessment will be reviewed on the problem list.  The patient had multiple paroxysmal spells- the character but changed. These changes were characterized by the loss of the presyncopal aura that preceded the untreated events. Aura was eliminated while she was on  Tegretol. She is now on Lamictal and  Keppra - both in therapeutic doses and yet has spells of loss of consciousness . She had at least 2 were generalized tonic-clonic seizures in 2013-2014 , and since last September /October months she had 5 more generalized tonic clonic seizures- these were felt by her service dog 15 minutes ahead, thus the patient was able to avoid injuries and awkward situations.   She also reports that she had been falling asleep in the classroom and was unarousable to her classmates and her professor in 2014 - but she changed classrooms after she noted the lights seemed to flicker and provoke her spells. The spells got immediately better. Her last seizure occurred in may 2015, found low blood sugars, 33mmol after a large meal and other ED reports form [place] , [place], confirmd these findings . She is almost compulsively hydrating and feels always thirsty, drinking water up to 4-5 liters a day .  Plan:   1) endocrinonology work up for hypoglycemia. Could be a trigger for her seizures / spells. Annette. Forde Dandy for consult- please evaluate. Marland Kitchen  2) Refilled medication.  Tegretol was barely showing up in her system - metabolic abnormality.  Lamictal added . Buccal swap.  Keppra was added.  Now will D/c carbamazepine.  3) hearing loss , service dog is "for seizures ". 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  Muscle biopsy would be the best test.

## 2014-06-12 NOTE — Patient Instructions (Signed)
Seizure, Adult °A seizure is abnormal electrical activity in the brain. Seizures usually last from 30 seconds to 2 minutes. There are various types of seizures. °Before a seizure, you may have a warning sensation (aura) that a seizure is about to occur. An aura may include the following symptoms:  °· Fear or anxiety. °· Nausea. °· Feeling like the room is spinning (vertigo). °· Vision changes, such as seeing flashing lights or spots. °Common symptoms during a seizure include: °· A change in attention or behavior (altered mental status). °· Convulsions with rhythmic jerking movements. °· Drooling. °· Rapid eye movements. °· Grunting. °· Loss of bladder and bowel control. °· Bitter taste in the mouth. °· Tongue biting. °After a seizure, you may feel confused and sleepy. You may also have an injury resulting from convulsions during the seizure. °HOME CARE INSTRUCTIONS  °· If you are given medicines, take them exactly as prescribed by your health care Ashten Prats. °· Keep all follow-up appointments as directed by your health care Oliver Heitzenrater. °· Do not swim or drive or engage in risky activity during which a seizure could cause further injury to you or others until your health care Blakelyn Dinges says it is OK. °· Get adequate rest. °· Teach friends and family what to do if you have a seizure. They should: °¨ Lay you on the ground to prevent a fall. °¨ Put a cushion under your head. °¨ Loosen any tight clothing around your neck. °¨ Turn you on your side. If vomiting occurs, this helps keep your airway clear. °¨ Stay with you until you recover. °¨ Know whether or not you need emergency care. °SEEK IMMEDIATE MEDICAL CARE IF: °· The seizure lasts longer than 5 minutes. °· The seizure is severe or you do not wake up immediately after the seizure. °· You have an altered mental status after the seizure. °· You are having more frequent or worsening seizures. °Someone should drive you to the emergency department or call local emergency  services (911 in [place] °MAKE SURE YOU: °· Understand these instructions. °· Will watch your condition. °· Will get help right away if you are not doing well or get worse. °Document Released: 11/28/2000 Document Revised: 09/21/2013 Document Reviewed: 07/13/2013 °ExitCare® Patient Information ©2015 [employer]. This information is not intended to replace advice given to you by your health care Salene Mohamud. Make sure you discuss any questions you have with your health care Dragon Thrush. ° °

## 2014-06-13 ENCOUNTER — Telehealth: Payer: Self-pay | Admitting: Neurology

## 2014-06-13 NOTE — Telephone Encounter (Signed)
Mechele Claude with Dr. Lorain Childes office [phone]) returning Dana's call.

## 2014-06-14 NOTE — Telephone Encounter (Signed)
I spoke to [place] at Dr. Baldwin Crown office and faxed over the referral and office notes.  She will contact me after Dr. Forde Dandy has had a chance to review the notes and let me know if he will be able to see the patient or not.

## 2014-07-18 ENCOUNTER — Telehealth: Payer: Self-pay | Admitting: Neurology

## 2014-07-18 NOTE — Telephone Encounter (Signed)
[name] with Dr. Reynold Bowen @ [phone], requesting a return call regarding referral.  Thanks

## 2014-07-18 NOTE — Telephone Encounter (Signed)
Spoke to Hurley at Dr. Baldwin Crown office and scheduled an appointment for August 20 for a 2:30 and to be there at 2:00 pm, also need to speak to the patient.  They are wanting her ssn , please link or call me when she calls back.

## 2014-07-18 NOTE — Telephone Encounter (Signed)
Left a vm for Mechele Claude to call me back from Dr. Baldwin Crown office.

## 2014-07-19 NOTE — Telephone Encounter (Signed)
Mechele Claude with Dr. Baldwin Crown office requesting a return call @ [phone].  Thanks

## 2014-07-19 NOTE — Telephone Encounter (Signed)
I called [name] at Dr. Baldwin Crown office to let her know the patient can't make the August 20 office visit because she will be leaving for school and does not have any time before she leaves.  Patient would like to schedule when she comes back on Fall break on October 27 or the 28th.

## 2014-07-20 NOTE — Telephone Encounter (Signed)
[name] with Dr. Baldwin Crown office called and he does not have any openings on October 27 or 28th, but he does have some in November 17, 24 or the 27th.  Left a vm for the patient to call back and see if any of those dates would work for her.

## 2014-07-24 NOTE — Telephone Encounter (Signed)
Left a vm about the available dates for a referral to Dr. Forde Dandy.

## 2014-07-26 NOTE — Telephone Encounter (Signed)
Called and left a message for the patient's mother to call regarding the referral for [name] with Dr. Forde Dandy.

## 2014-08-01 NOTE — Telephone Encounter (Signed)
Left a vm for the patient and her mother to call the office so that the referral to Dr. Forde Dandy can be scheduled.

## 2014-08-02 NOTE — Telephone Encounter (Signed)
Patient's mother was notified and wants to try and schedule for the 1st or 2nd week in January.  Will call Dr. Baldwin Crown office to see what they have available.

## 2014-08-10 NOTE — Telephone Encounter (Signed)
[name] with Dr. Baldwin Crown office called and they do have an opening for the 1st or 2nd week in January, on December 25, 2014 at 2:00 p.m.  Patient's mother was informed of the date and time of the appointment, address and the telephone number of the office.

## 2014-11-15 ENCOUNTER — Telehealth: Payer: Self-pay | Admitting: Neurology

## 2014-11-15 NOTE — Telephone Encounter (Signed)
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  Wants to know if Dr. Brett Fairy can email her a letter saying what happens when she has seizures.  She wants this letter so she can carry with her.  She has to have a muscle relaxer when this happens because of the pain.  She went to the ER and they were not very kind to her about this situation cause she has to go there everytime this happens. Please call mom she will explain in detail what happened and what needs to be in the letter.  You can try her home phone first, if no answer please try her cell [phone].

## 2014-11-16 NOTE — Telephone Encounter (Signed)
I spoke to mother.  Kandra at [employer] in [place].  When she has seizures now (brief or longer), her L shoulder pops out.  She has to go to ED for realignment (very painful).  Needs muscle relaxant and ativan if multiple seizures.  Last visit to ED, provider was not very kind or helpful.   Requesting letter that pt can carry with her and show them, this is what happens when she has a seizure and  recommendation on using of ativan for multiple seizures as well as muscle relaxant.  Relayed that provider at the ED would make the final decision on what he would give even with reccs.  She verbalized understanding.  Email: tp pt [email or web address].  Call mother when done.

## 2014-11-20 ENCOUNTER — Encounter: Payer: Self-pay | Admitting: *Deleted

## 2014-11-21 NOTE — Telephone Encounter (Signed)
Letter complete and needs signature.

## 2014-11-21 NOTE — Telephone Encounter (Signed)
Done this am and mother informed.

## 2014-12-19 ENCOUNTER — Encounter: Payer: Self-pay | Admitting: Adult Health

## 2014-12-19 ENCOUNTER — Ambulatory Visit (INDEPENDENT_AMBULATORY_CARE_PROVIDER_SITE_OTHER): Payer: 59 | Admitting: Adult Health

## 2014-12-19 VITALS — BP 109/78 | HR 74 | Ht 65.0 in | Wt 105.0 lb

## 2014-12-19 DIAGNOSIS — R569 Unspecified convulsions: Secondary | ICD-10-CM

## 2014-12-19 MED ORDER — LEVETIRACETAM 500 MG PO TABS: ORAL_TABLET | ORAL | Status: DC

## 2014-12-19 NOTE — Patient Instructions (Signed)
Increase Keppra 500 mg to 2 tablet in the AM and PM and 1 tablet at lunchtime.  Let us know if your seizure frequency does not decrease and we will add Vimpat.

## 2014-12-19 NOTE — Progress Notes (Signed)
PATIENT: [name] DOB: [birth year]  REASON FOR VISIT: follow up HISTORY FROM: patient  HISTORY OF PRESENT ILLNESS: [name] is a 24 year old female with a history of seizures. She returns today for follow-up. She is currently taking  Lamictal and Keppra. She reports that her seizures have about 7-8 tonic clonic seizures and then several complex partial seizures. She has had to go to the ED on several occasions. She states that the ED physician has had to double her does of ativan in order to get the seizures to stop. She states that the convulsions has cause her left shoulder to dislocate. This has progressively gotten worse. She has been seen at [facility] and they did not feel that surgery would give her long term benefits if she continued to have frequent seizures. She does have a service dog that notifies her 15 minutes or so prior to the onset of a seizure. Patient states that she is getting good rest and sleep at night.   HISTORY 06/09/14 [facility]): [name] is a 24 y.o. female here as a referral from Dr. Lynnette Caffey , [facility] neurological department. [name] is an established patient with [facility]- with a peculiar history of seizures or other paroxysmal spells. She lost her father to sudden cardiac arrest in 2013, while running.  I have followed [name] since the year 2010 at the time the patient had reported multiple paroxysmal spells but appeared to accompanied by a or rub of near-syncope. Dr. Leanne Chang primary care physician had announced her and made her appointment for her in the meanwhile she had 2 spells that were described as calm bolstering generalized seizures she had bitten her bugle tissue she had become incontinent.Followup EEGs over 30 minutes as well as a 24-hour EEG were normal and her MRI of the brain only showed a 3 mm incidental finding of a arachnoid cyst. During the spell her eyes stayed open but she was unable to focus or contact the patient had not driving license at that  time and thus privileges and didn't need to be revoked. She also was at [facility] seen in the Internal Medicine Department , where a vitamin D deficiency and low enzyme Q10 were identified and recommended to be taken as supplements.She felt less fatigued and less malaise and had less joint . We added Lamictal to the tegretol. The patient has a over 80% hearing loss she has reported that her handwriting has become "untidy" , she of has dropped objects mostly with the left hand. She does not have dysarthria , her speech has not changed. [name] , brings her seizure - therapy dog , a certified service animal, to her appointment today. Her Current College in [place] is much to her liking.  She plans to go to [employer] for her PhD. her sister is Product manager divinity school this year. The patent has had less frequent spells , but still has 2 "big" seizures a year on two antiepileptic medication. She noted a sensitivity to flashing lights. 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 She gets headaches and feels nauseated. She is not driving.  REVIEW OF SYSTEMS: Out of a complete 14 system review of symptoms, the patient complains only of the following symptoms, and all other reviewed systems are negative.  Hearing loss, light sensitivity, joint pain, joint swelling , dizziness, headache, numbness, seizures, weakness,  tremors  ALLERGIES: Allergies  Allergen Reactions  . Shellfish Allergy   . Cefuroxime Axetil     REACTION: rash, throat swells shut    HOME MEDICATIONS: Outpatient Prescriptions Prior to Visit  Medication Sig Dispense Refill  . ALPRAZolam (XANAX) 0.25 MG tablet Take 1 tablet (0.25 mg total) by mouth at bedtime as needed for sleep. 30 tablet 0  . carbamazepine (TEGRETOL) 200 MG tablet XR- 12H-TABLET 200MG . Take 2 tablets (400mg  total) twice a day 360 tablet 3  . CO ENZYME Q-10 PO Take by mouth 2 (two) times daily.    Marland Kitchen lamoTRIgine (LAMICTAL) 200 MG tablet Take 1 tablet (200 mg total) by mouth 2 (two) times daily. 180 tablet 3  . LamoTRIgine XR (LAMICTAL XR) 200 MG TB24 Take 1 tablet (200 mg total) by mouth 2 (two) times daily. 180 tablet 3  . levETIRAcetam (KEPPRA) 500 MG tablet Take 1 tablet (500 mg total) by mouth 3 (three) times daily. 270 tablet 3   No facility-administered medications prior to visit.    PAST MEDICAL HISTORY: Past Medical History  Diagnosis Date  . Seizure disorder 2010  . Hearing loss   . Muscle weakness of lower extremity     b/l  . Shoulder subluxation, left   . Seizures   . Migraine headache   . Deafness     progressive  . Mononucleosis 2009    no lymphatic symptoms  . Fatigue   . History of sprained wrist   . Hypoglycemia 06/12/2014    PAST SURGICAL HISTORY: Past Surgical History  Procedure Laterality Date  . Laparoscopic appendectomy  11/26/2011    Procedure: APPENDECTOMY LAPAROSCOPIC;  Surgeon: Gayland Curry, MD;  Location: [facility];  Service: General;  Laterality: N/A;  . Ovarian cyst removal  2/11    2 removed    FAMILY HISTORY: Family History  Problem Relation Age of Onset  . Polymyalgia rheumatica Mother   . Heart disease Maternal Grandmother   . Cancer Maternal Grandmother   . Heart disease Maternal Grandfather   . Stroke Maternal Grandfather   . Diabetes Maternal Grandfather   . COPD Maternal Grandfather   . Kidney failure  Maternal Grandfather   . Heart disease Paternal Grandmother   . Heart disease Paternal Grandfather   . Migraines Paternal Grandfather     SOCIAL HISTORY: History   Social History  . Marital Status: Single    Spouse Name: N/A    Number of Children: 0  . Years of Education: college   Occupational History  . Not on file.   Social History Main Topics  . Smoking status: Never Smoker   . Smokeless tobacco: Never Used  . Alcohol Use: No  . Drug Use: No  . Sexual Activity: Not on file   Other Topics Concern  . Not on file   Social History Narrative   Patient is single and lives at home with her parents when not in school.   Patient is currently attending Graduate school.   Patient right-handed.   Patient does not drink any caffeine.      PHYSICAL EXAM  Filed Vitals:   12/19/14 1328  BP: 109/78  Pulse: 74  Height: 5\' 5"  (1.651 m)  Weight: 105 lb (47.628  kg)   Body mass index is 17.47 kg/(m^2).  Generalized: Well developed, in no acute distress   Neurological examination  Mentation: Alert oriented to time, place, history taking. Follows all commands speech and language fluent Cranial nerve II-XII: Pupils were equal round reactive to light. Extraocular movements were full, visual field were full on confrontational test. Facial sensation and strength were normal. Uvula tongue midline. Head turning and shoulder shrug  were normal and symmetric. Motor: The motor testing reveals 5 over 5 strength of all 4 extremities. Good symmetric motor tone is noted throughout. Left shoulder is currently out of socket.  Sensory: Sensory testing is intact to soft touch on all 4 extremities except decreased on the left. No evidence of extinction is noted.  Coordination: Cerebellar testing reveals good finger-nose-finger and heel-to-shin bilaterally.  Gait and station: Gait is normal.   DIAGNOSTIC DATA (LABS, IMAGING, TESTING) - I reviewed patient records, labs, notes, testing and imaging  myself where available.  Lab Results  Component Value Date   WBC 5.7 06/29/2013   HGB 13.3 06/29/2013   HCT 39.9 06/29/2013   MCV 89 06/29/2013   PLT 217 11/27/2011      Component Value Date/Time   NA 137 06/29/2013 1437   NA 137 11/27/2011 0505   K 4.6 06/29/2013 1437   CL 100 06/29/2013 1437   CO2 21 06/29/2013 1437   GLUCOSE 81 06/29/2013 1437   GLUCOSE 93 11/27/2011 0505   BUN 17 06/29/2013 1437   BUN 4* 11/27/2011 0505   CREATININE 0.70 06/29/2013 1437   CALCIUM 9.4 06/29/2013 1437   PROT 7.3 06/29/2013 1437   PROT 6.1 11/27/2011 0505   ALBUMIN 3.4* 11/27/2011 0505   AST 18 06/29/2013 1437   ALT 11 06/29/2013 1437   ALKPHOS 86 06/29/2013 1437   BILITOT 0.2 06/29/2013 1437   GFRNONAA 123 06/29/2013 1437   GFRAA 141 06/29/2013 1437    Lab Results  Component Value Date   TSH 1.16 09/14/2009      ASSESSMENT AND PLAN 24 y.o. year old female  has a past medical history of Seizure disorder (2010); Hearing loss; Muscle weakness of lower extremity; Shoulder subluxation, left; Seizures; Migraine headache; Deafness; Mononucleosis (2009); Fatigue; History of sprained wrist; and Hypoglycemia (06/12/2014). here with:  1. Seizures  The patient has had an increase in her seizures since the last visit. She has had approximately 7-8 tonic-clonic seizures in addition to her partial complex seizures. I will increase her Keppra 500 mg to 2 tablets in the morning and the evening and 1 tablet at lunchtime. She will continue taking Lamictal. If her seizure frequency has not improved we may consider adding Vimpat in the future. The patient had blood work when she was in the hospital for seizures recently. I asked her to have blood work sent to our office. Due to her school schedule she will follow-up in 6 months.  Ward Givens, MSN, NP-C 12/19/2014, 1:27 PM [facility] [address] [place], [place] [address] [phone]  Note: This document was prepared  with digital dictation and possible smart phrase technology. Any transcriptional errors that result from this process are unintentional.

## 2014-12-19 NOTE — Progress Notes (Signed)
I agree with the assessment and plan as directed by NP .The patient is known to me .   Cedrick Partain, MD  

## 2015-01-04 ENCOUNTER — Telehealth: Payer: Self-pay | Admitting: *Deleted

## 2015-01-04 MED ORDER — LEVETIRACETAM 500 MG PO TABS: 500.0000 mg | ORAL_TABLET | Freq: Three times a day (TID) | ORAL | Status: DC

## 2015-01-04 MED ORDER — LACOSAMIDE 50 MG PO TABS: 50.0000 mg | ORAL_TABLET | Freq: Two times a day (BID) | ORAL | Status: DC

## 2015-01-04 NOTE — Telephone Encounter (Signed)
I called the patient she states that the increased dose of Keppra has caused some drowsiness, dizziness and nausea. I will decrease the patient's dose back to Keppra 500 mg 3 times a day. She she had R is taking 2 tablets of Keppra this morning she will take one tablet at lunch and one tablet at bedtime. Tomorrow she will take 1 tablet 3 times a day. I will give the patient a new prescription for Vimpat 50 mg BID. I have reviewed the side effects of Vimpat with the patient. She verbalized understanding. If she is unable to tolerate this medication she should let us know.

## 2015-01-04 NOTE — Telephone Encounter (Signed)
Pt having problems tolerating increased dose of keppra (1000mg -500mg -1000mg ),  fatigue, gi issues).  Asking to start on vimpat.   Needs prescription for [facility] at local [facility] in [place].  Also asking about email letters.  Megan NP is aware per mom.  Pt going back to school this weekend.  Trying to get med prior to leaving.

## 2015-05-25 ENCOUNTER — Ambulatory Visit: Payer: Self-pay | Admitting: Adult Health

## 2015-06-19 ENCOUNTER — Ambulatory Visit: Payer: 59 | Admitting: Neurology

## 2015-07-12 ENCOUNTER — Encounter: Payer: Self-pay | Admitting: Neurology

## 2015-07-12 ENCOUNTER — Ambulatory Visit (INDEPENDENT_AMBULATORY_CARE_PROVIDER_SITE_OTHER): Payer: 59 | Admitting: Neurology

## 2015-07-12 VITALS — BP 110/86 | HR 88 | Resp 20 | Ht 64.5 in | Wt 105.0 lb

## 2015-07-12 DIAGNOSIS — G40019 Localization-related (focal) (partial) idiopathic epilepsy and epileptic syndromes with seizures of localized onset, intractable, without status epilepticus: Secondary | ICD-10-CM | POA: Diagnosis not present

## 2015-07-12 MED ORDER — LORAZEPAM 2 MG/ML IJ SOLN: 1.0000 mg | Freq: Four times a day (QID) | INTRAMUSCULAR | Status: DC | PRN

## 2015-07-12 MED ORDER — LACOSAMIDE 50 MG PO TABS: 50.0000 mg | ORAL_TABLET | Freq: Two times a day (BID) | ORAL | Status: DC

## 2015-07-12 MED ORDER — LORAZEPAM 0.5 MG PO TABS: 0.5000 mg | ORAL_TABLET | ORAL | Status: DC | PRN

## 2015-07-12 NOTE — Patient Instructions (Signed)
To whom it may concern,    My patient, [name],, date of birth [birth year], suffers from a seizure disorder and is sometimes want by an aura but in about 50% of occasions she has very little time to respond to this. For this reason the patient will be prescribed Ativan as a intramuscular injection which could be given in the classroom should she suffer a seizure over 2 minutes duration, or seizure associated with cyanosis blueness of the lips blueness of the fingers and toes, it is usually not necessary to give an epileptic medication if the convulsive activity lasts less than 2 minutes.  She will bring this medication to the classroom. Sincerely C. Gift Rueckert M.D.  [facility],  [address] [place], [place] [address]

## 2015-07-12 NOTE — Progress Notes (Signed)
PATIENT: [name] DOB: August 27, 1991  REASON FOR VISIT: follow up HISTORY FROM: patient  HISTORY OF PRESENT ILLNESS: [name] is a 24 year old female with a history of seizures. She returns today for follow-up. She is currently taking  Lamictal and Keppra. She reports that her seizures have about 7-8 tonic clonic seizures and then several complex partial seizures. She has had to go to the ED on several occasions. She states that the ED physician has had to double her does of ativan in order to get the seizures to stop. She states that the convulsions has cause her left shoulder to dislocate. This has progressively gotten worse. She has been seen at [facility] and they did not feel that surgery would give her long term benefits if she continued to have frequent seizures. She does have a service dog that notifies her 15 minutes or so prior to the onset of a seizure. Patient states that she is getting good rest and sleep at night.   HISTORY 06/09/14 [facility]): [name] is a 24 y.o. female here as a referral from Dr. Lynnette Caffey , [facility] neurological department. [name] is an established patient with [facility]- with a peculiar history of seizures or other paroxysmal spells. She lost her father to sudden cardiac arrest in 2013, while running.  I have followed [name] since the year 2010 at the time the patient had reported multiple paroxysmal spells but appeared to accompanied by a or rub of near-syncope. Dr. Leanne Chang primary care physician had announced her and made her appointment for her in the meanwhile she had 2 spells that were described as calm bolstering generalized seizures she had bitten her bugle tissue she had become incontinent.Followup EEGs over 30 minutes as well as a 24-hour EEG were normal and her MRI of the brain only showed a 3 mm incidental finding of a arachnoid cyst. During the spell her eyes stayed open but she was unable to focus or contact the patient had not driving license at that  time and thus privileges and didn't need to be revoked. She also was at [facility] seen in the Internal Medicine Department , where a vitamin D deficiency and low enzyme Q10 were identified and recommended to be taken as supplements.She felt less fatigued and less malaise and had less joint . We added Lamictal to the tegretol. The patient has a over 80% hearing loss she has reported that her handwriting has become "untidy" , she of has dropped objects mostly with the left hand. She does not have dysarthria , her speech has not changed. [name] , brings her seizure - therapy dog , a certified service animal, to her appointment today. Her Current College in [place] is much to her liking.  She plans to go to [employer] for her PhD. her sister is Product manager divinity school this year. The patent has had less frequent spells , but still has 2 "big" seizures a year on two antiepileptic medication. She noted a sensitivity to flashing lights. 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 She gets headaches and feels nauseated. She is not driving.  REVIEW OF SYSTEMS: Out of a complete 14 system review of symptoms, the patient complains only of the following symptoms, and all other reviewed systems are negative.  Hearing loss, light sensitivity, joint pain, joint swelling , dizziness, headache, numbness, seizures, weakness, tremors,  shoulder pain due to dislocation.   ALLERGIES: Allergies  Allergen Reactions  . Shellfish Allergy   . Cefuroxime Axetil     REACTION: rash, throat swells shut    HOME MEDICATIONS: Outpatient Prescriptions Prior to Visit  Medication Sig Dispense Refill  . ALPRAZolam (XANAX) 0.25 MG tablet Take 1 tablet (0.25 mg total) by mouth at bedtime as needed for sleep. 30 tablet 0  . CO ENZYME Q-10 PO Take by mouth 2 (two) times daily.    Marland Kitchen lacosamide (VIMPAT) 50 MG TABS tablet Take 1 tablet (50 mg total) by mouth 2 (two) times daily. 60 tablet 3  . lamoTRIgine (LAMICTAL) 200 MG tablet Take 1 tablet (200 mg total) by mouth 2 (two) times daily. 180 tablet 3  . LamoTRIgine XR (LAMICTAL XR) 200 MG TB24 Take 1 tablet (200 mg total) by mouth 2 (two) times daily. 180 tablet 3  . levETIRAcetam (KEPPRA) 500 MG tablet Take 1 tablet (500 mg total) by mouth 3 (three) times daily. Take two tablets in the morning and the evening and one tablet at lunchtime. 90 tablet 3   No facility-administered medications prior to visit.    PAST MEDICAL HISTORY: Past Medical History  Diagnosis Date  . Seizure disorder 2010  . Hearing loss   . Muscle weakness of lower extremity     b/l  . Shoulder subluxation, left   . Seizures   . Migraine headache   . Deafness     progressive  . Mononucleosis 2009    no lymphatic symptoms  . Fatigue   . History of sprained wrist   . Hypoglycemia 06/12/2014    PAST SURGICAL HISTORY: Past Surgical History  Procedure Laterality Date  . Laparoscopic appendectomy  11/26/2011    Procedure: APPENDECTOMY LAPAROSCOPIC;  Surgeon: Gayland Curry, MD;  Location: [facility];  Service: General;  Laterality: N/A;  . Ovarian cyst removal  2/11    2 removed    FAMILY HISTORY: Family History  Problem Relation Age of Onset  . Polymyalgia rheumatica Mother   . Heart disease Maternal Grandmother   . Cancer Maternal Grandmother   . Heart disease Maternal Grandfather   . Stroke Maternal  Grandfather   . Diabetes Maternal Grandfather   . COPD Maternal Grandfather   . Kidney failure Maternal Grandfather   . Heart disease Paternal Grandmother   . Heart disease Paternal Grandfather   . Migraines Paternal Grandfather     SOCIAL HISTORY: History   Social History  . Marital Status: Single    Spouse Name: N/A  . Number of Children: 0  . Years of Education: college   Occupational History  . Not on file.   Social History Main Topics  . Smoking status: Never Smoker   . Smokeless tobacco: Never Used  . Alcohol Use: No  . Drug Use: No  . Sexual Activity: Not on file   Other Topics Concern  . Not on file   Social History Narrative   Patient is single and lives at home with her parents when not in school.   Patient is currently attending Graduate school.   Patient right-handed.   Patient does not drink any caffeine.      PHYSICAL EXAM  Filed Vitals:  07/12/15 1502  BP: 110/86  Pulse: 88  Resp: 20  Height: 5' 4.5" (1.638 m)  Weight: 105 lb (47.628 kg)   Body mass index is 17.75 kg/(m^2).  Generalized: Well developed, in no acute distress   Neurological examination  Mentation: Alert oriented to time, place, history taking. Follows all commands speech and language fluent Cranial nerve II-XII: Pupils were equal round reactive to light. Extraocular movements were full, visual field were full on confrontational test. Facial sensation and strength were normal. Uvula tongue midline. Head turning and shoulder shrug  were normal and symmetric. Motor: The motor testing reveals 5 over 5 strength of all 4 extremities. Good symmetric motor tone is noted throughout. Left shoulder is currently out of socket.  Sensory: Sensory testing is intact to soft touch on all 4 extremities except decreased on the left. No evidence of extinction is noted.  Coordination: Cerebellar testing reveals good finger-nose-finger and heel-to-shin bilaterally.  Gait and station: Gait is  normal.   DIAGNOSTIC DATA (LABS, IMAGING, TESTING) - I reviewed patient records, labs, notes, testing and imaging myself where available.  Awaiting [employer] EEG and labs, and all reports.    ASSESSMENT AND PLAN 24 y.o. year old female  has a past medical history of Seizure disorder (2010); Hearing loss; Muscle weakness of lower extremity; Shoulder subluxation, left; Seizures; Migraine headache; Deafness; Mononucleosis (2009); Fatigue; History of sprained wrist; and Hypoglycemia (06/12/2014). here with: left hand numbness - related to shoulder dislocation.   1. Epilepsy : The patient has had an increase in her seizures during 2015 , and was changed to VIMPAT, which has helped. The seizures are briefer. She has had approximately 7-8 tonic-clonic seizures in addition to her partial complex seizures in 2015 . Hospitalized for seizures recently at [facility] in spring 2016 , in the neurological unit.  I asked her to have blood work and EEG sent to our office. She dislocated her  shoulder once again . She had no trigger to the Springtime seizures, " tired and stressed".  She is interested in a VNS stimulator. Upon her request and preference  will be happy to refer to EMU at [facility] and Dr Gloriann Loan for further evaluation. It was mentioned to her , that she may have a mitochondrial disorder, given the history of  Deafness and seizures- She is interested in shoulder surgery, which will depend on her seizure control.   Endocrinology (Dr. Buddy Duty)  work up was negative for hypoglycemia as cause of "spells" .    Due to her school schedule she will follow-up in 4-5 months, over Christmas Break.     NOTE To school; [name] will need a local neurologist in [place] to follow for her suspected  seizure disorder, she will also be evaluated for possible V and S stimulator locally here. Whenever she will have the implantation it will be better for her to be closer to her family to  take care of her after discharge and not to have this in her college town. I will write a letter to her school for her to carry. This will specify protocol when to call 911 or how to react to her seizures when they occur. The patient does have an assistance dog, that seems to be able to warn her of an upcoming seizure about 50% of the time. She will carry out a by mouth for emergencies if she has time to react to an aura. I would like for her to have me down  his alarm nasal spray available but this is a compound pharmacy substance and I have been unsuccessful locally in [place] to get it. It may be able to compound this in [place] for her.  Waqas Bruhl, MD   07/12/2015, 3:35 PM [facility] [address], [place], [place] [address] [phone]  Note: This document was prepared with digital dictation and possible smart phrase technology. Any transcriptional errors that result from this process are unintentional.

## 2015-07-13 ENCOUNTER — Telehealth: Payer: Self-pay | Admitting: Neurology

## 2015-07-13 NOTE — Telephone Encounter (Signed)
Pharmacy called regarding the ativan IM injection. Mom expected like "Epi-Pen" type of pre-loaded syringe, but all pharmacy has it the vial and needle and syringe. Mom did not feel comfortable to do the injection like that.   I told pharmacy that this is the form of ativan we have. Either IM or IV via the vials. No pre-loaded ativan available. I asked pharmacist to show her how to do the IM injections. Hopefully mom is getting comfortable with that. I asked pt mom to call back next Monday to check with Dr. Brett Fairy on Monday but pharmacist stated that pt goes on a flight trip Monday and concerns if she has seizure on the flight and she has to inject the ativan. I encourage the pharmacist to tell mom ask help from flight attendant if mom does not feel comfortable to do it. Pharmacist expressed understanding and he will prescribe the vials and syringes to her.   Rosalin Hawking, MD PhD Stroke Neurology 07/13/2015 4:58 PM

## 2015-07-15 ENCOUNTER — Other Ambulatory Visit: Payer: Self-pay | Admitting: Neurology

## 2015-07-23 ENCOUNTER — Telehealth: Payer: Self-pay | Admitting: Neurology

## 2015-07-23 NOTE — Telephone Encounter (Signed)
I called the pharmacy regarding Rx's.  Pharmacist indicated the patient's ins does not cover Vimpat or Lamictal XR.  Both require PA.  I have contacted ins and provided clinical info, asking to expedite requests.  They are currently being reviewed.  Vimpat Ref # [id number]  Lamictal Ref # [id number]

## 2015-07-23 NOTE — Telephone Encounter (Signed)
Mom Barnetta Chapel called stating was told by [facility] [address] told her to call us to expedite auth for lamoTRIgine (LAMICTAL) 200 MG tablet and lacosamide (VIMPAT) 50 MG TABS tablet. Also that Dr Brett Fairy wanted daughter referred for VNS at [facility], do we set that up for January or do they call to schedule that. Daughter is leaving for school, will be back in around Dec 18th-approx Jan 20th

## 2015-07-23 NOTE — Telephone Encounter (Signed)
Returned pt's mothers call. No answer, asked that she call me back.  We have sent a referral request to [facility] for a neurologist for the pt and to [facility] (Dr. Gloriann Loan)  to get set up for a VNS. They will contact pt for set up.

## 2015-07-24 NOTE — Telephone Encounter (Signed)
Mom is returning your call. Please call cell number [phone]

## 2015-07-24 NOTE — Telephone Encounter (Signed)
Returned pt's mother's call. Advised her that the referral for VNS placement was sent to Dr. Gloriann Loan at [facility] [phone]) and also for a neurologist at [facility] for an epileptologist while pt is at school. Pt verbalized understanding.  Pt's mother requesting an update on the PA for vimpat and Lamictal. I advised mother that I would ask Janett Billow, CPHT to give her an update when she has one.

## 2015-07-24 NOTE — Telephone Encounter (Signed)
Ins typically asks for 3-5 business days to review requests.  I called them to see if they could provide an update of the status, as mother is inquiring.  Spoke with [employer].  She verified they did receive the requests, and they are currently still under review.  They are aware we requested expedited response, and said they typically respond to these requests in 3 business days.  I called Ms Muralles back to relay this info.  Got no answer.  Left message.

## 2015-07-25 NOTE — Telephone Encounter (Signed)
Both prior auth's have been approved, valid for 1 year.  (Vimpat Ref # [id number] Lamictal Ref # [id number])  I called Ms. Clinger back to advise.  She is aware.

## 2015-07-26 ENCOUNTER — Telehealth: Payer: Self-pay

## 2015-07-26 ENCOUNTER — Telehealth: Payer: Self-pay | Admitting: Neurology

## 2015-07-26 NOTE — Telephone Encounter (Signed)
Pt received phone call that Lamictal was approved but the pharmacy states that they have not received the actual prescription.  Pt's mother is trying to get all meds before pt leaves for college Sunday.  They have received the Bimpat and are very grateful.  Mother's cell phone is [phone].  Barnetta Chapel is mother's name.

## 2015-07-26 NOTE — Telephone Encounter (Signed)
I called the pharmacy.  Spoke with [employer].  He verified they do have Lamictal Rx, but had not yet filled it.  He says it does go through ins without any issues.  I called Ms Carrithers back.  Spoke with Judson Roch.  Relayed info.  She expressed understanding and appreciation.

## 2015-07-26 NOTE — Telephone Encounter (Signed)
Spoke to [place] at Dr. Purvis Sheffield office. She confirmed that the pt has an appt on September 30th at 9:15 with Dr. Gloriann Loan for VNS placement consult. Olegario Shearer is mailing pt a letter as well, asking that if this appt does not work, to call Dr. Purvis Sheffield office to reschedule.

## 2015-08-09 ENCOUNTER — Telehealth: Payer: Self-pay | Admitting: Neurology

## 2015-08-09 ENCOUNTER — Other Ambulatory Visit: Payer: Self-pay

## 2015-08-09 DIAGNOSIS — Z5181 Encounter for therapeutic drug level monitoring: Secondary | ICD-10-CM

## 2015-08-09 DIAGNOSIS — R569 Unspecified convulsions: Secondary | ICD-10-CM

## 2015-08-09 MED ORDER — LORAZEPAM 2 MG/ML IJ SOLN: 1.0000 mg | Freq: Four times a day (QID) | INTRAMUSCULAR | Status: DC | PRN

## 2015-08-09 NOTE — Telephone Encounter (Signed)
Dr. Brett Fairy said to order the pt 26 refills to get her through one year. I tried to change the RX but since this is a controlled substance I can only order 5 refills. I advised pt of this. She said she would contact her pharmacy in [place] to contact us to get more refills.  Dr. Brett Fairy said to order the EKG for pt. Ordered and faxed to [place] at [phone].

## 2015-08-09 NOTE — Telephone Encounter (Signed)
Spoke to pt. She needs unlimited refills on her ativan IM injection because the vial must be discarded every 2 weeks if not used.  She also needs an order for an EKG to be done at [facility]. Can be faxed to [phone] and must contain the reason for the EKG. (Pt is taking injectable meds.)

## 2015-08-09 NOTE — Telephone Encounter (Signed)
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 So she is asking for unlimited refills for every 2 weeks. The university is asking for ECG for student who take injectible meds. She asking for referral to mailed to her so the student health ctr could do it. Please call and advise. She can be reached at [phone]. Her email address [email or web address].

## 2015-08-10 NOTE — Telephone Encounter (Signed)
Patient called [phone] [facility] [place] requesting for LORazepam (ATIVAN) 2 MG/ML injection to be given subcutaneously vs. intramuscular

## 2015-08-10 NOTE — Telephone Encounter (Signed)
I called the patient back to clarify if we need to contact [place], or [place] to clarify Rx.  Got no answer.  Left message.  If we need to contact [facility], we will need to obtain their contact info please.

## 2015-08-17 NOTE — Unmapped (Signed)
Pt calling and was transferred by scheduling to have someone teach her how to do ativan injections. She is not really a pt of ours yet and she is here out of town for school. 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 Also she will need a note stating that they were shown how. Pls advise.

## 2015-08-17 NOTE — Unmapped (Signed)
Called patient to let her know that we can not instruct her to do anything at this point since we have not seen patient. Patient is an epilepsy patient, she needs to schedule appointment with an epilepsy Doctor. Call completed.

## 2015-12-04 ENCOUNTER — Ambulatory Visit: Payer: 59 | Admitting: Neurology

## 2016-01-02 ENCOUNTER — Ambulatory Visit: Payer: 59 | Admitting: Neurology

## 2016-02-28 ENCOUNTER — Other Ambulatory Visit: Payer: Self-pay | Admitting: Neurology

## 2016-02-28 ENCOUNTER — Telehealth: Payer: Self-pay | Admitting: Neurology

## 2016-02-28 NOTE — Telephone Encounter (Signed)
Pt returned Kristen's call °

## 2016-02-28 NOTE — Telephone Encounter (Signed)
I called pt back to advise her that Dr. Brett Fairy does not get in the office today until 10:00. As soon as she gets in, I will ask her to sign the refill request. I also want to make sure she wants [facility] [place], [place]. Left a message asking that she call me back.

## 2016-02-28 NOTE — Telephone Encounter (Signed)
RX for ativan [facility] [place], [place]. I called pt back to inform her. No answer, left a message asking her to call me back. Please advise pt that her RX has been faxed if she calls back.

## 2016-02-28 NOTE — Telephone Encounter (Signed)
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

## 2016-03-27 ENCOUNTER — Telehealth: Payer: Self-pay

## 2016-03-27 NOTE — Telephone Encounter (Signed)
I called to advise pt that the form that pt's mother dropped off yesterday for a service animal permit in a rental house was signed by Dr. Brett Fairy and faxed back to the rental property management.  No answer, left a message on both of pt's numbers to call me back.

## 2016-03-27 NOTE — Telephone Encounter (Signed)
I advised patient of message below that form was faxed to the rental property management.

## 2016-03-27 NOTE — Telephone Encounter (Signed)
Mom called back to say thank you to [facility] for faxing the form and for Kristen's prompt attention.

## 2016-03-27 NOTE — Telephone Encounter (Signed)
Noted, thank you

## 2016-04-28 DIAGNOSIS — E8842 MERRF syndrome: Secondary | ICD-10-CM | POA: Diagnosis not present

## 2016-04-28 DIAGNOSIS — X58XXXA Exposure to other specified factors, initial encounter: Secondary | ICD-10-CM | POA: Diagnosis not present

## 2016-04-28 DIAGNOSIS — M25512 Pain in left shoulder: Secondary | ICD-10-CM | POA: Diagnosis not present

## 2016-04-28 DIAGNOSIS — S43302A Subluxation of unspecified parts of left shoulder girdle, initial encounter: Secondary | ICD-10-CM | POA: Diagnosis not present

## 2016-04-29 ENCOUNTER — Telehealth: Payer: Self-pay | Admitting: Neurology

## 2016-04-29 DIAGNOSIS — E8842 MERRF syndrome: Secondary | ICD-10-CM | POA: Diagnosis not present

## 2016-04-29 DIAGNOSIS — S43015A Anterior dislocation of left humerus, initial encounter: Secondary | ICD-10-CM | POA: Diagnosis not present

## 2016-04-29 DIAGNOSIS — M25512 Pain in left shoulder: Secondary | ICD-10-CM | POA: Diagnosis not present

## 2016-04-29 DIAGNOSIS — G40909 Epilepsy, unspecified, not intractable, without status epilepticus: Secondary | ICD-10-CM | POA: Diagnosis not present

## 2016-04-29 DIAGNOSIS — M24412 Recurrent dislocation, left shoulder: Secondary | ICD-10-CM | POA: Diagnosis not present

## 2016-04-29 DIAGNOSIS — R202 Paresthesia of skin: Secondary | ICD-10-CM | POA: Diagnosis not present

## 2016-04-29 DIAGNOSIS — Z1389 Encounter for screening for other disorder: Secondary | ICD-10-CM | POA: Diagnosis not present

## 2016-04-29 NOTE — Telephone Encounter (Signed)
I called pt back to discuss. No answer, left a message asking her to call me back. 

## 2016-04-29 NOTE — Telephone Encounter (Signed)
[name]:[id number] SAME DOHMIER [phone] * [phone] 91 IN [place] AT MOMENT/DISLOCATED SHOULDER/ER WILL NOT PUT IT BACK IN/PCB/STILL HAVING SEIZURES

## 2016-04-29 NOTE — Telephone Encounter (Signed)
I called pt again, no answer, already left a VM so I did not leave another.

## 2016-04-30 NOTE — Telephone Encounter (Signed)
Pt returned my call. Pt is currently in [place]. She reports that yesterday, she had a seizure and dislocated her shoulder. To relocate her shoulder, pt had to be placed under anesthesia. While pt was coming out of anesthesia, she had a 17 minute seizure and she was told she was in status epilepticus. She has never had a seizure that lasted that long. She was told that she needed to call her neurologist and make sure Dr. Brett Fairy was aware of this incident. I told pt that I would inform Dr. Brett Fairy and call her back if Dr. Brett Fairy had any recommendations. Pt verbalized understanding.   I made a follow up appt for 05/21/16 at 11:30 with the pt to discuss her seizure activity with Dr. Brett Fairy. Pt verbalized understanding.

## 2016-04-30 NOTE — Telephone Encounter (Signed)
Spoke to pt. I advised her that Dr. Brett Fairy needs to see the hospital records. Pt says she has been discharged already but will call the hospital and get the records sent to [place]. I advised her that hospitals cannot just release information to anyone that requests it, she must sign a medical release at the hospital. Pt said she will get the records sent to [place].

## 2016-04-30 NOTE — Telephone Encounter (Signed)
I called pt again, no answer, left a message asking her to call me back. This is my third attempt at calling pt back. Pt has not returned 2 voicemails or three phone calls. Will send a letter to address on file asking her to call me back.

## 2016-04-30 NOTE — Telephone Encounter (Signed)
Needs to have medication adjusted, please forward hospital records for me to review. CD

## 2016-05-02 ENCOUNTER — Telehealth: Payer: Self-pay | Admitting: Neurology

## 2016-05-02 NOTE — Telephone Encounter (Signed)
Horris Latino from [place] called to say that [name] was coming back to [place]. She was getting on the airplane [place] because she wanted to get back to [place]. She has had many seizures the past week.     I will forward this information to Dr. Brett Fairy and [name] so that she can be scheduled for a follow-up.  Apparently, the emergency room had also asked Clarise Cruz to get a list of what Dr. Brett Fairy wanted them to do if she presented with a seizure. She will be spending most of the year in [place] starting in the fall.

## 2016-05-05 NOTE — Telephone Encounter (Signed)
Spoke to pt. She does not want the video EEG performed before her follow up with Dr. Brett Fairy on 05/21/16. She is agreeable to the VNS rep coming to the appointment to discuss possible VNS.   Pt says she wants Dr. Brett Fairy input on using anesthesia/sedation if she has another shoulder dislocation before her appt in June. She reports that her seizure occurred when anesthesia was stopped, last 29 minutes, received 7mg  of ativan IV before her seizure stopped. Should pt ask for no anesthesia/sedation if her should is dislocated?  Pt is also working with the hospital in [place] to get her medical records sent to [place].

## 2016-05-05 NOTE — Telephone Encounter (Signed)
Would you like to have a VNS rep at pts appt on 05/21/16? I think this pt has been interested in VNS in the past. If so, I can ask them to be at this appt.

## 2016-05-05 NOTE — Telephone Encounter (Signed)
Yes, please.

## 2016-05-05 NOTE — Telephone Encounter (Signed)
Prolonged VIDEO EEG with the [facility]. CD

## 2016-05-05 NOTE — Telephone Encounter (Signed)
I called pt to discuss EEG order and VNS. No answer, left a message asking her to call me back.

## 2016-05-06 NOTE — Telephone Encounter (Signed)
I called pt to discuss. No answer, left a message asking her to call me back. 

## 2016-05-06 NOTE — Telephone Encounter (Signed)
Should be able to get non morphine- non ultram. the easiest may be propofol , but only safe with intubation.

## 2016-05-21 ENCOUNTER — Encounter: Payer: Self-pay | Admitting: Neurology

## 2016-05-21 ENCOUNTER — Telehealth: Payer: Self-pay

## 2016-05-21 ENCOUNTER — Ambulatory Visit (INDEPENDENT_AMBULATORY_CARE_PROVIDER_SITE_OTHER): Payer: BLUE CROSS/BLUE SHIELD | Admitting: Neurology

## 2016-05-21 VITALS — BP 102/60 | HR 76 | Resp 20 | Ht 65.0 in | Wt 105.0 lb

## 2016-05-21 DIAGNOSIS — G40019 Localization-related (focal) (partial) idiopathic epilepsy and epileptic syndromes with seizures of localized onset, intractable, without status epilepticus: Secondary | ICD-10-CM

## 2016-05-21 MED ORDER — LAMOTRIGINE 200 MG PO TABS: 200.0000 mg | ORAL_TABLET | Freq: Two times a day (BID) | ORAL | Status: DC

## 2016-05-21 MED ORDER — LACOSAMIDE 50 MG PO TABS: 50.0000 mg | ORAL_TABLET | Freq: Two times a day (BID) | ORAL | Status: DC

## 2016-05-21 MED ORDER — LORAZEPAM 2 MG/ML IJ SOLN: 2.0000 mg | Freq: Once | INTRAMUSCULAR | Status: DC

## 2016-05-21 MED ORDER — LORAZEPAM 0.5 MG PO TABS: 0.5000 mg | ORAL_TABLET | ORAL | Status: DC | PRN

## 2016-05-21 NOTE — Telephone Encounter (Signed)
I called pt to discuss referral to [place] neurologist. No answer, left a message asking her to call me back.

## 2016-05-21 NOTE — Telephone Encounter (Signed)
Pt returned call. Please call back.

## 2016-05-21 NOTE — Telephone Encounter (Signed)
I spoke to pt. I advised her that a referral for Dr. Eino Farber will be placed by our office. That office will call pt to schedule. Pt verbalized understanding.

## 2016-05-21 NOTE — Telephone Encounter (Signed)
I returned pt's calls. No answer, left another message asking her to call me back.

## 2016-05-21 NOTE — Patient Instructions (Addendum)
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 Several falls repeatedly in relation to seizure activity have had a negative impact and she has been suffering from repeated dislocation , dominantly to the left shoulder . Elbows have also been injured. Based on this I would like the patient to be allowed to have anesthesia if repositioning of her dislocated joints is necessary. Muscle relaxants do not work to allow a manual relocation. 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 We have discussed several strategies to reduce photosensitivity but she cannot avoid exposure to some fluorescent lights. The other possible it would be beneficial for her to have another elimination of her work space either with LED,  halogen or incandescent light.  Sincerely, Larey Seat M.D.  [facility],  [place], [place]

## 2016-05-21 NOTE — Progress Notes (Addendum)
Provider:  Larey Seat, M D  Referring Provider: Lisabeth Pick, MD Primary Care Physician:  Lisabeth Pick, MD  Chief Complaint  [name] presents with  . Follow-up    discuss seizures and possible VNS    HPI:  [name] is a 25 y.o. female  Is seen here as a revisit  from Dr. Leanne Chang for     [name] has suffered several seizures while attending Annette last semester at [employer] [facility] [place] . [name] professors have been familiar with [name] seizure disorder which consists of partial complex secondary generalization patterns. She also still has [name] seizure dog. She has dislocated [name] shoulder more than once in falls related to seizure. She is planning to move to [place] with Annette next semester but will spend Annette summer here in [place]. I took Annette opportunity to introducer today to Annette [employer] nerve stimulator. Since several antiseizure medications in various doses and combinations have not made [name] seizures more infrequent or less violent. She was introduced to Annette VNS therapy video, I would like for [name] to be evaluated by Dr. Johnny Bridge , she has had various EEG recordings in Annette past that I will be happy to attach. Please note that [name] also has hearing loss and that there has been a question of a mitochondrial disease, MERFF. A neuromuscular colleague in [place] tested [name] lactic acid concentration in [name] blood and found to be high. Myoclonic epilepsy with ragged red fibers.   Review of Systems: Out of a complete 14 system review, [name] complains of only Annette following symptoms, and all other reviewed systems are negative. Seizures   Social History   Social History  . Marital Status: Single    Spouse Name: N/A  . Number of Children: 0  . Years of Education: college   Occupational History  . Not on file.   Social History Main Topics  . Smoking status: Never Smoker   . Smokeless tobacco: Never Used  . Alcohol Use: No  . Drug Use: No  . Sexual Activity:  Not on file   Other Topics Concern  . Not on file   Social History Narrative   [name] is single and lives at home with [name] parents when not in school.   [name] is currently attending Graduate school.   [name] right-handed.   [name] does not drink any caffeine.    Family History  Problem Relation Age of Onset  . Polymyalgia rheumatica Mother   . Heart disease Maternal Grandmother   . Cancer Maternal Grandmother   . Heart disease Maternal Grandfather   . Stroke Maternal Grandfather   . Diabetes Maternal Grandfather   . COPD Maternal Grandfather   . Kidney failure Maternal Grandfather   . Heart disease Paternal Grandmother   . Heart disease Paternal Grandfather   . Migraines Paternal Grandfather     Past Medical History  Diagnosis Date  . Seizure disorder ([place]) 2010  . Hearing loss   . Muscle weakness of lower extremity     b/l  . Shoulder subluxation, left   . Seizures ([place])   . Migraine headache   . Deafness     progressive  . Mononucleosis 2009    no lymphatic symptoms  . Fatigue   . History of sprained wrist   . Hypoglycemia 06/12/2014    Past Surgical History  Procedure Laterality Date  . Laparoscopic appendectomy  11/26/2011    Procedure: APPENDECTOMY LAPAROSCOPIC;  Surgeon:  Gayland Curry, MD;  Location: [place];  Service: General;  Laterality: N/A;  . Ovarian cyst removal  2/11    2 removed    Current Outpatient Prescriptions  Medication Sig Dispense Refill  . ALPRAZolam (XANAX) 0.25 MG tablet Take 1 tablet (0.25 mg total) by mouth at bedtime as needed for sleep. 30 tablet 0  . CO ENZYME Q-10 PO Take by mouth 2 (two) times daily.    Marland Kitchen lacosamide (VIMPAT) 50 MG TABS tablet Take 1 tablet (50 mg total) by mouth 2 (two) times daily. 180 tablet 3  . lamoTRIgine (LAMICTAL) 200 MG tablet Take 1 tablet (200 mg total) by mouth 2 (two) times daily. 180 tablet 3  . LamoTRIgine XR 200 MG TB24 TAKE 1 TABLET (200 MG TOTAL) BY MOUTH 2 (TWO) TIMES DAILY. 180 tablet 4    . levETIRAcetam (KEPPRA) 500 MG tablet Take 1 tablet (500 mg total) by mouth 3 (three) times daily. Take two tablets in Annette morning and Annette evening and one tablet at lunchtime. 90 tablet 3  . LORazepam (ATIVAN) 0.5 MG tablet Take 1 tablet (0.5 mg total) by mouth as needed for anxiety. 30 tablet 1  . LORazepam (ATIVAN) 2 MG/ML injection Inject 1 mL (2 mg total) into Annette muscle once. 10 mL 3   No current facility-administered medications for this visit.    Allergies as of 05/21/2016 - Review Complete 05/21/2016  Allergen Reaction Noted  . Shellfish allergy  11/26/2011  . Cefuroxime axetil    . 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 I refilled [name] seizure medication including Lamictal, Vimpat, and Keppra.  She will follow-up with [place] for further evaluation of Annette VNS stimulator before Annette end of this month   Larey Seat MD 05/21/2016                [name]: [name] DOB: 1991/01/15  REASON FOR VISIT: follow up HISTORY FROM: [name]  HISTORY OF PRESENT ILLNESS: [name] is a 25 year old female with a history of seizures. She returns today for follow-up. She is currently taking  Lamictal and Keppra. She reports that [name] seizures have about 7-8 tonic clonic seizures and then several complex partial seizures. She has had to go to Annette ED on several occasions. She states that Annette ED physician has had to double [name] does of ativan in order to get Annette seizures to stop. She states that Annette convulsions  has cause [name] left shoulder to dislocate. This has progressively gotten worse. She has been seen at [facility] and they did not feel that surgery would give [name] long term benefits if she continued to have frequent seizures. She does have a service dog that notifies [name] 15 minutes or so prior to Annette onset of a seizure. [name] states that she is getting good rest and sleep at night.   HISTORY 06/09/14 [facility]): Tzirel Madia is a 25 y.o. female here as a referral from Dr. Lynnette Caffey ,  [facility]. [name] is an established [name] with [employer]- with a peculiar history of seizures or other paroxysmal spells. She lost [name] father to sudden cardiac arrest in 2013, while running.  I have followed Mrs. [name] since Annette year 2010 at Annette time [name] had reported multiple paroxysmal spells but appeared to accompanied by a or rub of near-syncope. Dr. 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 During Annette spell [name] eyes stayed open but she was unable to focus or contact [name] had not driving license at that time and thus privileges and didn't need to be revoked. She also was at [facility] seen in Annette Internal Medicine Department , where a vitamin D deficiency and low enzyme Q10 were identified and recommended to be taken as supplements.She felt less fatigued and less malaise and had less joint . We added Lamictal to Annette tegretol. [name] has a over 80% hearing loss she has reported that [name] handwriting has become "untidy" , she of has dropped objects mostly with Annette left hand. She does not have dysarthria , [name] speech has not changed. [name] , brings [name]  seizure - therapy dog , a certified service animal, to [name] appointment today. [name] Current College in [place] is much to [name] liking.  She plans to go to [employer] for her PhD. [name] sister is Product manager divinity school this year. Annette patent has had less frequent spells , but still has 2 "big" seizures a year on two antiepileptic medication. She noted a sensitivity to flashing lights. She has been bothered by power point presentation and laser pointers in Annette college classroom, which also seems to irritate [name]. She gets headaches and feels nauseated. She is not driving.  REVIEW OF SYSTEMS: Out of a complete 14 system review of symptoms, [name] complains only of Annette following symptoms, and all other reviewed systems are negative.  Hearing loss, light sensitivity, joint pain, joint swelling , dizziness, headache, numbness, seizures, weakness, tremors, shoulder pain due to dislocation.   ALLERGIES: Allergies  Allergen Reactions  . Shellfish Allergy   . Cefuroxime Axetil     REACTION: rash, throat swells shut  . Midazolam Nausea And Vomiting    HOME MEDICATIONS: Outpatient Prescriptions Prior to Visit  Medication Sig Dispense Refill  . ALPRAZolam (XANAX) 0.25 MG tablet Take 1 tablet (0.25 mg total) by mouth at bedtime as needed for sleep. 30 tablet 0  . CO ENZYME Q-10 PO Take by mouth 2 (two) times daily.    . LamoTRIgine XR 200 MG TB24 TAKE 1 TABLET (200 MG TOTAL) BY MOUTH 2 (TWO) TIMES DAILY. 180 tablet  4  . levETIRAcetam (KEPPRA) 500 MG tablet Take 1 tablet (500 mg total) by mouth 3 (three) times daily. Take two tablets in Annette morning and Annette evening and one tablet at lunchtime. 90 tablet 3  . lacosamide (VIMPAT) 50 MG TABS tablet Take 1 tablet (50 mg total) by mouth 2 (two) times daily. 180 tablet 3  . lamoTRIgine (LAMICTAL) 200 MG tablet Take 1 tablet (200 mg total) by mouth 2 (two) times daily. 180 tablet 3  . LORazepam (ATIVAN) 0.5 MG tablet Take 1 tablet (0.5 mg total) by mouth as needed for anxiety. 30 tablet 1  . LORazepam (ATIVAN) 2 MG/ML injection INJECT 0.5 MLS (1 MG TOTAL) INTO Annette MUSCLE EVERY 6 HOURS AS NEEDED FOR SEIZURE 1 mL 3   No facility-administered medications prior to visit.    PAST MEDICAL HISTORY: Past Medical History  Diagnosis Date  . Seizure disorder ([place]) 2010  . Hearing loss   . Muscle weakness of lower extremity     b/l  . Shoulder subluxation, left   . Seizures ([place])   . Migraine headache   . Deafness     progressive  . Mononucleosis 2009    no lymphatic symptoms  . Fatigue   . History of sprained wrist   . Hypoglycemia 06/12/2014    PAST SURGICAL HISTORY: Past Surgical History  Procedure Laterality Date  . Laparoscopic appendectomy  11/26/2011    Procedure: APPENDECTOMY LAPAROSCOPIC;  Surgeon: Gayland Curry, MD;  Location: [place];  Service: General;  Laterality: N/A;  . Ovarian cyst removal  2/11    2 removed    FAMILY HISTORY: Family History  Problem Relation Age of Onset  . Polymyalgia rheumatica Mother   . Heart disease Maternal Grandmother   . Cancer Maternal Grandmother   . Heart disease Maternal Grandfather   . Stroke Maternal Grandfather   . Diabetes Maternal Grandfather   . COPD Maternal Grandfather   . Kidney failure Maternal Grandfather   . Heart disease Paternal Grandmother   . Heart disease Paternal Grandfather   . Migraines Paternal Grandfather     SOCIAL HISTORY: Social History   Social History  . Marital Status:  Single    Spouse Name: N/A  . Number of Children: 0  . Years of Education: college   Occupational History  . Not on file.   Social History Main Topics  . Smoking status: Never Smoker   . Smokeless tobacco: Never Used  . Alcohol Use: No  . Drug Use: No  . Sexual Activity: Not on file   Other Topics Concern  .  Not on file   Social History Narrative   [name] is single and lives at home with [name] parents when not in school.   [name] is currently attending Graduate school.   [name] right-handed.   [name] does not drink any caffeine.      PHYSICAL EXAM  Filed Vitals:   05/21/16 1141  BP: 102/60  Pulse: 76  Resp: 20  Height: 5\' 5"  (1.651 m)  Weight: 105 lb (47.628 kg)   Body mass index is 17.47 kg/(m^2).  Generalized: Well developed, in no acute distress   Neurological examination  Mentation: Alert oriented to time, place, history taking. Follows all commands speech and language fluent Cranial nerve II-XII: Pupils were equal round reactive to light. Extraocular movements were full, visual field were full on confrontational test. Facial sensation and strength were normal. Uvula tongue midline. Head turning and shoulder shrug  were normal and symmetric. Motor: Annette motor testing reveals 5 over 5 strength of all 4 extremities. Good symmetric motor tone is noted throughout. Left shoulder is currently out of socket.  Sensory: Sensory testing is intact to soft touch on all 4 extremities except decreased on Annette left. No evidence of extinction is noted.  Coordination: Cerebellar testing reveals good finger-nose-finger and heel-to-shin bilaterally.  Gait and station: Gait is normal.   DIAGNOSTIC DATA (LABS, IMAGING, TESTING) - I reviewed [name] records, labs, notes, testing and imaging myself where available.  Awaiting [employer] EEG and labs, and all reports.    ASSESSMENT AND PLAN 25 y.o. year old female  has a past medical history of Seizure disorder ([place])  (2010); Hearing loss; Muscle weakness of lower extremity; Shoulder subluxation, left; Seizures ([place]); Migraine headache; Deafness; Mononucleosis (2009); Fatigue; History of sprained wrist; and Hypoglycemia (06/12/2014). here with: left hand numbness - related to shoulder dislocation.   1. Epilepsy : [name] has had an increase in [name] seizures during 2015 , and was changed to VIMPAT, which has helped. Annette seizures are briefer. She has had approximately 7-8 tonic-clonic seizures in addition to [name] partial complex seizures in 2015 . Hospitalized for seizures recently at [facility] in spring 2016 , in Annette neurological unit.  I asked [name] to have blood work and EEG sent to our office. She dislocated [name]  shoulder once again . She had no trigger to Annette Springtime seizures, " tired and stressed".  She is interested in a VNS stimulator. Upon [name] request and preference  will be happy to refer to EMU at [facility] and Dr Gloriann Loan for further evaluation. It was mentioned to [name] , that she may have a mitochondrial disorder, given Annette history of  Deafness and seizures- She is interested in shoulder surgery, which will depend on [name] seizure control.   Endocrinology (Dr. Buddy Duty)  work up was negative for hypoglycemia as cause of "spells" .    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 ( MERFF)  Part of [name] epilepsy syndrome is an increased photosensitivity. 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 Several falls repeatedly in relation to seizure activity have had a negative impact and she has been suffering from repeated dislocation , dominantly to Annette left shoulder . Elbows have also been injured. Based on this I would like [name] to be allowed to have anesthesia if repositioning of [name] dislocated joints is  necessary. Muscle  relaxants do not work to allow a manual relocation. 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 We have discussed several strategies to reduce photosensitivity but she cannot avoid exposure to some fluorescent lights. Annette other possible it would be beneficial for [name] to have another elimination of [name] work space either with LED,  halogen or incandescent light.  Sincerely, Larey Seat M.D.  [facility],  [place], [place], [place]   05/21/2016, 12:15 PM [facility] [address] [place], [place] [address] [phone]

## 2016-05-26 DIAGNOSIS — G40219 Localization-related (focal) (partial) symptomatic epilepsy and epileptic syndromes with complex partial seizures, intractable, without status epilepticus: Secondary | ICD-10-CM | POA: Diagnosis not present

## 2016-05-29 ENCOUNTER — Telehealth: Payer: Self-pay

## 2016-05-29 NOTE — Telephone Encounter (Signed)
I called pt. Her VNS surgery is on 07/01/16, but she reports that she may have to postpone surgery until August since she is flying to [place] on 7/23-7/31. Dr. Purvis Sheffield office will let her know if the surgery is able to be moved to August. I made a follow up on 07/15/16 at 4:00 with pt to turn the VNS on, given that her surgery is on 7/18. Pt knows to call me if her surgery is postponed.

## 2016-06-04 ENCOUNTER — Telehealth: Payer: Self-pay | Admitting: Neurology

## 2016-06-04 DIAGNOSIS — R569 Unspecified convulsions: Secondary | ICD-10-CM

## 2016-06-04 NOTE — Telephone Encounter (Signed)
Just get her a wheelchair. CD

## 2016-06-04 NOTE — Telephone Encounter (Signed)
I spoke to pt. Pt's school in [place] is requiring pt to have a wheelchair to sit in after her seizures. Pt is asking for an order to be sent to [facility] in [facility].  Pt is also asking for a letter to carry in case she has to have anesthesia. She wants the letter to ask that she receives ativan before anesthesia to hopefully prevent/decrease her seizures during anesthesia.  Pt has also cancelled her VNS surgery in July and is trying to reschedule it for August. VNS rep aware.  Dr. Brett Fairy, do you agree with the wheelchair order and the letter requesting ativan administration (IM or IV?) before anesthesia?

## 2016-06-04 NOTE — Telephone Encounter (Signed)
Order for wheelchair sent to [facility], per pt request.

## 2016-06-04 NOTE — Addendum Note (Signed)
Addended by: Lester Norcross A on: 06/04/2016 03:10 PM   Modules accepted: Orders

## 2016-06-04 NOTE — Telephone Encounter (Signed)
Pt called request RN to call her regarding: school, medication and surgery. She did not want to go into detail.

## 2016-06-04 NOTE — Telephone Encounter (Signed)
I spoke to Dr. Brett Fairy. Dr. Brett Fairy is ok with pt having ativan before anesthesia if the pt thinks this will help.

## 2016-06-05 NOTE — Telephone Encounter (Signed)
I spoke to pt and advised her that Dr. Brett Fairy signed her letter for her ativan. Dr. Brett Fairy also ordered a wheelchair and the order was sent to [facility]. Pt says that she changed her mind and would like the prescription for the wheelchair just to be mailed to her so she can take it to somewhere in [place]. Will mail both the letter and the wheelchair order to pt. Pt verbalized understanding.

## 2016-06-05 NOTE — Telephone Encounter (Signed)
Dr. Brett Fairy signed pt's letter authorizing the use of ativan before anesthesia, ativan to be given IM or IV. Copy of letter sent to [facility].  [facility] accepted wheelchair order..  I called pt to discuss. No answer, left a message asking her to call me back

## 2016-06-05 NOTE — Telephone Encounter (Signed)
I returned pt's call. No answer, left another voicemail.

## 2016-06-05 NOTE — Telephone Encounter (Signed)
Patient returned Kristen's call °

## 2016-06-05 NOTE — Addendum Note (Signed)
Addended by: Lester Trent A on: 06/05/2016 11:50 AM   Modules accepted: Orders

## 2016-06-26 DIAGNOSIS — R112 Nausea with vomiting, unspecified: Secondary | ICD-10-CM | POA: Diagnosis not present

## 2016-06-26 DIAGNOSIS — Y999 Unspecified external cause status: Secondary | ICD-10-CM | POA: Diagnosis not present

## 2016-06-26 DIAGNOSIS — S43015A Anterior dislocation of left humerus, initial encounter: Secondary | ICD-10-CM | POA: Diagnosis not present

## 2016-06-26 DIAGNOSIS — S43012A Anterior subluxation of left humerus, initial encounter: Secondary | ICD-10-CM | POA: Diagnosis not present

## 2016-06-26 DIAGNOSIS — R0789 Other chest pain: Secondary | ICD-10-CM | POA: Diagnosis not present

## 2016-06-26 DIAGNOSIS — M25512 Pain in left shoulder: Secondary | ICD-10-CM | POA: Diagnosis not present

## 2016-06-26 DIAGNOSIS — R569 Unspecified convulsions: Secondary | ICD-10-CM | POA: Diagnosis not present

## 2016-06-26 DIAGNOSIS — W1839XA Other fall on same level, initial encounter: Secondary | ICD-10-CM | POA: Diagnosis not present

## 2016-06-26 DIAGNOSIS — X58XXXA Exposure to other specified factors, initial encounter: Secondary | ICD-10-CM | POA: Diagnosis not present

## 2016-06-26 DIAGNOSIS — Y929 Unspecified place or not applicable: Secondary | ICD-10-CM | POA: Diagnosis not present

## 2016-06-26 DIAGNOSIS — S40912A Unspecified superficial injury of left shoulder, initial encounter: Secondary | ICD-10-CM | POA: Diagnosis not present

## 2016-06-26 DIAGNOSIS — M791 Myalgia: Secondary | ICD-10-CM | POA: Diagnosis not present

## 2016-06-26 DIAGNOSIS — S43005A Unspecified dislocation of left shoulder joint, initial encounter: Secondary | ICD-10-CM | POA: Diagnosis not present

## 2016-07-01 ENCOUNTER — Telehealth: Payer: Self-pay | Admitting: Neurology

## 2016-07-01 DIAGNOSIS — G40309 Generalized idiopathic epilepsy and epileptic syndromes, not intractable, without status epilepticus: Secondary | ICD-10-CM

## 2016-07-01 NOTE — Telephone Encounter (Signed)
I spoke to pt. She is requesting a referral to [facility], specifically Dr. Glo Herring, who specializes in epilepsy. She is not sure if Dr. Glo Herring is a VNS provider but still wants a referral to her. I advised pt that I would reach out to our VNS rep and see if he knows of VNS providers in [place]. Pt verbalized understanding.

## 2016-07-01 NOTE — Telephone Encounter (Signed)
Pt called me back. She will be in [place] next week 7/24-7/29 and she needs to be seen any day next week at the [facility] if possible. This is a request of her school that she be seen by them ASAP. She is asking if we can send an urgent referral and ask them to see her next week.  Hilda Blades, when you send this referral, can you call them and ask for this pt to be scheduled next week if possible and then let the pt know? Thank you!

## 2016-07-01 NOTE — Telephone Encounter (Signed)
Patient is calling and would like a recommendation on a neurologist in [place], [place].  The ER has suggested to her to see [facility]. Could a referral be faxed to the practice today if possible. Patient stated this is important to her. Thanks!

## 2016-07-02 ENCOUNTER — Ambulatory Visit: Payer: BLUE CROSS/BLUE SHIELD | Admitting: Neurology

## 2016-07-03 NOTE — Telephone Encounter (Signed)
I called Dr. Johnnye Sima office at [facility]. I asked that they please see the pt next week. They would not add the pt on the schedule for next week. They would only get her scheduled for the 2nd week of August.  I called pt to discuss. No answer, left a message asking her to call me back.

## 2016-07-03 NOTE — Telephone Encounter (Signed)
[facility] [phone] called for clarification on referral, please call.

## 2016-07-06 DIAGNOSIS — M25512 Pain in left shoulder: Secondary | ICD-10-CM | POA: Diagnosis not present

## 2016-07-06 DIAGNOSIS — M24412 Recurrent dislocation, left shoulder: Secondary | ICD-10-CM | POA: Diagnosis not present

## 2016-07-06 DIAGNOSIS — S43005A Unspecified dislocation of left shoulder joint, initial encounter: Secondary | ICD-10-CM | POA: Diagnosis not present

## 2016-07-06 DIAGNOSIS — E8842 MERRF syndrome: Secondary | ICD-10-CM | POA: Diagnosis not present

## 2016-07-06 DIAGNOSIS — G40909 Epilepsy, unspecified, not intractable, without status epilepticus: Secondary | ICD-10-CM | POA: Diagnosis not present

## 2016-07-06 DIAGNOSIS — S43015A Anterior dislocation of left humerus, initial encounter: Secondary | ICD-10-CM | POA: Diagnosis not present

## 2016-07-11 ENCOUNTER — Telehealth: Payer: Self-pay | Admitting: Neurology

## 2016-07-11 DIAGNOSIS — G40409 Other generalized epilepsy and epileptic syndromes, not intractable, without status epilepticus: Secondary | ICD-10-CM | POA: Diagnosis not present

## 2016-07-11 DIAGNOSIS — G40209 Localization-related (focal) (partial) symptomatic epilepsy and epileptic syndromes with complex partial seizures, not intractable, without status epilepticus: Secondary | ICD-10-CM | POA: Diagnosis not present

## 2016-07-11 DIAGNOSIS — S43015A Anterior dislocation of left humerus, initial encounter: Secondary | ICD-10-CM | POA: Diagnosis not present

## 2016-07-11 DIAGNOSIS — M25512 Pain in left shoulder: Secondary | ICD-10-CM | POA: Diagnosis not present

## 2016-07-11 DIAGNOSIS — S43005A Unspecified dislocation of left shoulder joint, initial encounter: Secondary | ICD-10-CM | POA: Diagnosis not present

## 2016-07-11 DIAGNOSIS — S43302A Subluxation of unspecified parts of left shoulder girdle, initial encounter: Secondary | ICD-10-CM | POA: Diagnosis not present

## 2016-07-11 DIAGNOSIS — G40909 Epilepsy, unspecified, not intractable, without status epilepticus: Secondary | ICD-10-CM | POA: Diagnosis not present

## 2016-07-11 DIAGNOSIS — S43085A Other dislocation of left shoulder joint, initial encounter: Secondary | ICD-10-CM | POA: Diagnosis not present

## 2016-07-11 DIAGNOSIS — S43015D Anterior dislocation of left humerus, subsequent encounter: Secondary | ICD-10-CM | POA: Diagnosis not present

## 2016-07-11 DIAGNOSIS — X58XXXA Exposure to other specified factors, initial encounter: Secondary | ICD-10-CM | POA: Diagnosis not present

## 2016-07-11 DIAGNOSIS — H53149 Visual discomfort, unspecified: Secondary | ICD-10-CM | POA: Diagnosis not present

## 2016-07-11 DIAGNOSIS — S43035A Inferior dislocation of left humerus, initial encounter: Secondary | ICD-10-CM | POA: Diagnosis not present

## 2016-07-11 DIAGNOSIS — W1830XA Fall on same level, unspecified, initial encounter: Secondary | ICD-10-CM | POA: Diagnosis not present

## 2016-07-11 NOTE — Telephone Encounter (Signed)
The patient is in [place], had a seizure and dislocated the shoulder, which has happened before. I have recommended that they bring her back to [facility] to get the shoulder fixed, not do it there. She has a well established orthopedic surgeon here.

## 2016-07-15 ENCOUNTER — Ambulatory Visit: Payer: Self-pay | Admitting: Neurology

## 2016-08-22 DIAGNOSIS — G40919 Epilepsy, unspecified, intractable, without status epilepticus: Secondary | ICD-10-CM | POA: Diagnosis not present

## 2016-08-22 DIAGNOSIS — H9193 Unspecified hearing loss, bilateral: Secondary | ICD-10-CM | POA: Diagnosis not present

## 2016-08-22 DIAGNOSIS — Z62810 Personal history of physical and sexual abuse in childhood: Secondary | ICD-10-CM | POA: Diagnosis not present

## 2016-08-22 DIAGNOSIS — E8842 MERRF syndrome: Secondary | ICD-10-CM | POA: Diagnosis not present

## 2016-08-25 ENCOUNTER — Telehealth: Payer: Self-pay | Admitting: Neurology

## 2016-08-25 NOTE — Telephone Encounter (Signed)
Patient returned [name]'s call, please call [phone].

## 2016-08-25 NOTE — Telephone Encounter (Signed)
I spoke to pt. She says that she had the VNS surgery at [facility], but she still doesn't know the name of the surgeon. She said that the generator of the VNS was not turned on because her "cardiac cath did not come back right". Pt reports that the only stimulation for it is from the magnet.  Pt says that she wants to revoke DPR access for Bunnie Philips, professor at [place] of [place]. I advised her that I believe this request will need to be in writing, but I will check with medical records.  Pt also asked me to call Dr. Benito Mccreedy back and tell her that pt did have the surgery but not to say anything else and that pt will go talk talk to her today.  I spoke to Dr. Benito Mccreedy and advised her that pt had her VNS surgery at [facility] and that the pt will provide her with the name of the surgeon. I advised Dr. Benito Mccreedy that the pt plans on coming to her office today to discuss.

## 2016-08-25 NOTE — Telephone Encounter (Signed)
Vivi Barrack MD / [facility] called about some urgent matters pertaining to the pt. Please call cell: [phone]. She states pt did sign a release and was faxed on Friday. Regarding VNS protocol for the pt.

## 2016-08-25 NOTE — Telephone Encounter (Signed)
I spoke to pt. She had the VNS surgery in [place] but cannot remember the surgeons name. The generator has not been turned on, she is just swiping her magnet. She has not seen much of a difference yet. She will call me back with the name of the surgeon and I will inform Dr. Benito Mccreedy of this information.

## 2016-08-25 NOTE — Telephone Encounter (Signed)
I spoke to Dr. Benito Mccreedy at [facility] [place]. She reports that pt had VNS surgery. Dr. Benito Mccreedy has questions regarding the VNS. She reports that pt has a "modified VNS" and can't swipe the magnet through her shirt. She wants to know more about the device. I advised her that I have never heard of a modified VNS and that we didn't even know that the pt had surgery-it is not in her records with [place]. I advised her that I would try and find out where pt had the surgery done.  I reached out to our VNS rep, who was also unaware that pt had surgery.  I called pt to discuss. No answer, left a message asking her to call me back.

## 2016-08-26 ENCOUNTER — Telehealth: Payer: Self-pay | Admitting: Neurology

## 2016-08-26 ENCOUNTER — Encounter: Payer: Self-pay | Admitting: Neurology

## 2016-08-26 NOTE — Telephone Encounter (Signed)
I received another phone call from the pt asking if Dr. Jannifer Franklin faxed the letter, and if it went through. I spoke to Dr. Jannifer Franklin, he reports that he faxed the letter and that a receipt of confirmation was received.  I called pt to advise her of this. No answer, left a message asking her to call me back.

## 2016-08-26 NOTE — Telephone Encounter (Signed)
I received another urgent phone call from the pt. She reports that she needs another letter sent to her school saying that VNS was implanted in her left lower chest. The generator has not been turned on yet but it is in magnet mode. The magnet must be held over generator under clothing for 60 seconds when she has an aura. She needs this urgently. I advised her that the office is currently closed. I will speak to Dr. Brett Fairy regarding this letter tomorrow. Pt verbalized understanding.

## 2016-08-26 NOTE — Telephone Encounter (Signed)
Patient called requesting call back from nurse [facility] ASAP, having issues with her school, needs documentation to help with things.

## 2016-08-26 NOTE — Telephone Encounter (Signed)
I called the patient. The patient needs a letter documenting that she has a vagal nerve stimulator that has been placed. This was recommended by Dr. Brett Fairy. The patient claims that the stimulator has already been placed and is operational.

## 2016-08-26 NOTE — Telephone Encounter (Signed)
I spoke to pt. She is in tears. She is saying that she is being "yelled at" by her school and they will not let her on campus because she needs a letter from her primary neurologist that says that Dr. Brett Fairy recommended that pt have a VNS and that she had the surgery. She wants the letter to say "Ms. [name] is under my care at [facility]. It is my recommendation that she have a VNS implant. She has had the VNS surgery." I again asked her who the surgeon was, and she cannot tell me, other than it was performed at the [facility]. She just says that she has called them and they are not returning her calls. Pt insists that if she does not have this letter, then she will be "kicked out of school" and "can't get my PHD." She also says that [place] has accessed her medical records "inappropriately" and there are lawyers involved.  I advised her that Dr. Brett Fairy is out of the office today and that I would have to send this request to the work in doctor. Pt insists that she has to have this letter by 1:00pm in [place] or she will not be allowed to pursue her PHD there. I advised her that since she cannot give me the name of the surgeon, we cannot actually verify that she had the VNS surgery. She keeps saying that all she needs is a letter that says that Dr. Brett Fairy recommended that she have a VNS and that she has had the surgery.  I will speak to Dr. Jannifer Franklin, the work in physician this afternoon.

## 2016-08-27 NOTE — Telephone Encounter (Signed)
Pt called me again, urgently, I took phone call. Pt reports that she now needs a letter that says that a "lay person with medical permission may swipe the magnet over the generator in [name]'s left lower chest under her clothes for 60 seconds before, during, or after a seizure." She only has 45 minutes to obtain this letter or else she will not be able to register for her classes. She says that the legal representation for the [employer] says that pt cannot be allowed to swipe her own magnrt while she is on campus, and thereore trained laypersons (such as professors or Estate manager/land agent) will be the only ones to swipe pt's magnet. I spoke to Dr. Jannifer Franklin, who is agreeable to writing this letter, and signing on Dr. Edwena Felty behalf. The letter was written, and Dr. Jannifer Franklin signed next for Dr. Brett Fairy. The letter was again faxed to Nicanor Alcon at [phone]. I spoke to pt again and advised her of this. Pt verbalized understanding and great appreciation for our efforts and help.

## 2016-08-27 NOTE — Telephone Encounter (Signed)
Dr. Vivi Barrack called, asking if we have records from pt's VNS surgery.  I called pt. I advised her that Dr. Benito Mccreedy from [facility] called asking for records from her VNS surgery. Pt says that she "will take care of it." We do not have actual surgical records from the surgery that I can find from [facility].

## 2016-08-27 NOTE — Telephone Encounter (Signed)
Dr. Brett Fairy is not in the office today. However, I spoke to Dr. Jannifer Franklin, and he was agreeable to signing this letter for the pt, on behalf of Dr. Brett Fairy.  The letter was faxed to [name], fax: [phone]. Received a receipt of confirmation.   I called the pt. She says that [facility] advised her that they did get the letter from this morning. Pt thanked me for my efforts and confirmed that she will call me back for any further concerns or questions.

## 2016-08-27 NOTE — Telephone Encounter (Signed)
Dr. Benito Mccreedy called in to speak with nurse. She was skyped and took the call.

## 2016-08-27 NOTE — Telephone Encounter (Signed)
I spoke to our MR department. They advised me that pt would need to sign a new DPR to revoke access to [employer]. I spoke to pt and advised her of this. She asked that I fax her a new form to [phone] and she will complete it and sign it and return it back to me.

## 2016-08-27 NOTE — Telephone Encounter (Signed)
Received another urgent call from the pt. She reports that she needs another letter from [place] stating that we have documentation of her VNS surgery and send it to Nicanor Alcon fax [phone]. She will fax me a copy of her VNS card. She was told by the VNS company that this is verification that she has her VNS and it gives her generator number.  Pt also states that as of today 08/27/2016 at 16:40, she only wishes our office to speak to her mother. If anyone from [place] calls [place], we are to not speak with them. I asked her to please fax me a new DPR (as I have already asked her to do.)  I provided pt with my direct fax number to fax her VNS card to [place]. I will await this fax.

## 2016-08-27 NOTE — Telephone Encounter (Signed)
Pt called in request that : VNS generator can be operated by a lay person. A lay person can use a magnet under her shirt for 60 sec. There was more however the phone call was dropped. Please call and advise

## 2016-08-27 NOTE — Telephone Encounter (Signed)
Received another call from the pt. She faxed me the VNS card. I did receive the card. It is a card that says that she has had the VNS implanted on 08/06/2016, Model Serial number: [id number], Generator: [id number], Lead: [id number].   Pt is insistent that I fax a letter that says, "Ms. Kindschi has provided documentation of her VNS implant to our office" to Nicanor Alcon at [phone].   I spoke to Dr. Jannifer Franklin. He agreed that the VNS card that pt sent is verification of her surgery. He signed the letter on behalf of Dr. Brett Fairy.  I will send a copy of pt's VNS card to MR to be scanned into EPIC.  I faxed the letter to [facility]. I did not send a copy of pt's VNS card, only the letter.  I called pt and advised her that the letter was faxed and I received a receipt of confirmation. She verbalized understanding and appreciation. She asked that again, as of 08/27/2016 5:15 pm, we only speak to her mother and the pt going forward, now that she should have the letters sent to [place], there should be no reason for them to call us. I asked pt to please fill out a new DPR, sign, and fax it to me ( I already faxed a new one to her). The old DPR still has [place] as allowed contacts. She says that she will fax it back to me tomorrow.

## 2016-08-28 NOTE — Telephone Encounter (Signed)
Pt called in requesting call from nurse stating she has issues with her school. Please call and advise

## 2016-08-28 NOTE — Telephone Encounter (Signed)
Patient called requests to speak w/Kristen about urgent situation with school, please call [phone]. States "she really needs for that fax to be sent", "it's been a nightmare."

## 2016-08-28 NOTE — Telephone Encounter (Signed)
Patient called requesting to speak with [facility] ASAP regarding new issue involving implant, please call [phone].

## 2016-08-28 NOTE — Telephone Encounter (Signed)
I called pt back. She reports that now her school is requiring a letter that states "We have independent verification of [name]'s VNS implant." She reports that the school thinks that the letter from yesterday that said "[name] has provided documentation of her VNS implant to our office" is not sufficient, and the school is concerned that pt just called and told us to say that without having verified this information. I will AGAIN verify with Dr. Brett Fairy that a copy of pt's VNS card is sufficient enough evidence of pt's VNS surgery and counts as "independent verification". Pt also reports that [facility] is mailing her records from the surgery to [place]. However, she again urgently needs a letter from Dr. Brett Fairy in the meantime so she may proceed with registering for classes. I advised pt that Dr. Brett Fairy is with a pt but when she is available I will discuss this with her. Pt verbalized understanding. Pt asked that I fax this letter to 2 numbers:  [place] and to [phone] ATTN: Philosophy Department.

## 2016-08-28 NOTE — Telephone Encounter (Signed)
I spoke to Dr. Brett Fairy. She agreed that a copy of pt's VNS card with the pt's name, generator number, date of surgery, etc, is independent verification that pt has had the VNS surgery. She signed the letter that the pt requested. (Copy of this letter with signature was sent to MR to be scanned.)  I called pt back. I advised her that the letter was signed by Dr. Brett Fairy and faxed to the numbers she requested. Received a receipt of confirmation for both fax numbers.  Pt states, "thank you so much. You have been wonderful and I can't thank you enough. I hope that this is the end of this nightmare." Pt reports that she is seeing an epileptologist at the end of September.  I asked pt to please call back with any further questions or concerns.

## 2016-08-28 NOTE — Telephone Encounter (Signed)
I received another urgent phone call from the pt. I took the call. Pt reports that she now needs the letter to say "We have independent verification that [name] has had a VNS implant on August 23rd, 2017." (date added) This date is consistent with her VNS card. Pt asked me to please fax this letter to the same 2 fax numbers listed. [place] and [phone] ATTN philosophy dept. Pt again expressed gratitude and understanding.  I spoke to Dr. Brett Fairy who agreed, and signed the letter. A copy of signed letter sent to MR to be scanned into EPIC.  I faxed letter to both fax numbers. Received a receipt of confirmation for both.

## 2016-08-29 ENCOUNTER — Telehealth: Payer: Self-pay | Admitting: Neurology

## 2016-08-29 NOTE — Telephone Encounter (Signed)
[name]/[employer] [phone](personal cell) wants to know what VNS is and what is the use of it. She also said the type of VNS the pt has described and the type in the booklet that was given to her does not match. Also she is needing verification of VNS implant from the office. She said it is very strange that the pt keeps routing her thru people that did not actually do the implant. Please call

## 2016-08-29 NOTE — Telephone Encounter (Signed)
Phone call from Bunnie Philips, Faculty Member/[employer] [phone] (DPR on file, not to release information to anyone from [place]) received fax from [place], letter says if there are any questions to call, Horris Latino has questions.

## 2016-09-01 NOTE — Telephone Encounter (Signed)
Pt's DPR has revoked all access to any one other than herself. I cannot speak to anyone at [facility].   I called pt to advise her that we have gotten 2 phone calls from them. No answer, left a message asking her to call me back.

## 2016-09-01 NOTE — Telephone Encounter (Signed)
Pt returned call. Nurse took call

## 2016-09-01 NOTE — Telephone Encounter (Signed)
I spoke to pt. I advised her that Bunnie Philips has called [place] twice requesting more information regarding the pt. Pt says that she still does not want [place] to release any information to them and asked that I not return their calls. Pt reports that now her school has the surgical records from her VNS. However, she thinks she is going to turn the VNS completely off and not have it operational at all since her school has given her so much trouble. She says that we should not receive any more phone calls since the surgeon sent records from her surgery. She has been allowed to register for classes. She asked for the name of the [facility] neurologist that we referred her to, and I gave her Dr. Lynne Leader name. She is going to call them and make an appt. She wants Dr. Brett Fairy to know that she will continue to use the ativan IM as needed for seizure activity since she is going to have her VNS turned off. She will make an appt to see Dr. Brett Fairy over Christmas to discuss the need and use of VNS from that point onward. Pt needs to have the VNS rep present for that appt. Pt asked me to call her and let her know if people from the [place] continue to call me.

## 2016-09-04 DIAGNOSIS — Z881 Allergy status to other antibiotic agents status: Secondary | ICD-10-CM | POA: Diagnosis not present

## 2016-09-04 DIAGNOSIS — M24412 Recurrent dislocation, left shoulder: Secondary | ICD-10-CM | POA: Diagnosis not present

## 2016-09-04 DIAGNOSIS — S43085A Other dislocation of left shoulder joint, initial encounter: Secondary | ICD-10-CM | POA: Diagnosis not present

## 2016-09-04 DIAGNOSIS — Z9114 Patient's other noncompliance with medication regimen: Secondary | ICD-10-CM | POA: Diagnosis not present

## 2016-09-04 DIAGNOSIS — G40909 Epilepsy, unspecified, not intractable, without status epilepticus: Secondary | ICD-10-CM | POA: Diagnosis not present

## 2016-09-04 DIAGNOSIS — S43015A Anterior dislocation of left humerus, initial encounter: Secondary | ICD-10-CM | POA: Diagnosis not present

## 2016-09-04 DIAGNOSIS — Z885 Allergy status to narcotic agent status: Secondary | ICD-10-CM | POA: Diagnosis not present

## 2016-09-05 DIAGNOSIS — S43015A Anterior dislocation of left humerus, initial encounter: Secondary | ICD-10-CM | POA: Diagnosis not present

## 2016-09-19 DIAGNOSIS — G40919 Epilepsy, unspecified, intractable, without status epilepticus: Secondary | ICD-10-CM | POA: Diagnosis not present

## 2016-09-19 DIAGNOSIS — S43085A Other dislocation of left shoulder joint, initial encounter: Secondary | ICD-10-CM | POA: Diagnosis not present

## 2016-09-25 DIAGNOSIS — S0990XA Unspecified injury of head, initial encounter: Secondary | ICD-10-CM | POA: Diagnosis not present

## 2016-09-25 DIAGNOSIS — M25512 Pain in left shoulder: Secondary | ICD-10-CM | POA: Diagnosis not present

## 2016-09-25 DIAGNOSIS — S43005A Unspecified dislocation of left shoulder joint, initial encounter: Secondary | ICD-10-CM | POA: Diagnosis not present

## 2016-09-25 DIAGNOSIS — S43395A Dislocation of other parts of left shoulder girdle, initial encounter: Secondary | ICD-10-CM | POA: Diagnosis not present

## 2016-09-25 DIAGNOSIS — Z885 Allergy status to narcotic agent status: Secondary | ICD-10-CM | POA: Diagnosis not present

## 2016-09-25 DIAGNOSIS — W1830XA Fall on same level, unspecified, initial encounter: Secondary | ICD-10-CM | POA: Diagnosis not present

## 2016-09-25 DIAGNOSIS — S43015A Anterior dislocation of left humerus, initial encounter: Secondary | ICD-10-CM | POA: Diagnosis not present

## 2016-09-25 DIAGNOSIS — Z881 Allergy status to other antibiotic agents status: Secondary | ICD-10-CM | POA: Diagnosis not present

## 2016-09-25 DIAGNOSIS — G40909 Epilepsy, unspecified, not intractable, without status epilepticus: Secondary | ICD-10-CM | POA: Diagnosis not present

## 2016-10-20 ENCOUNTER — Telehealth: Payer: Self-pay | Admitting: Neurology

## 2016-10-20 NOTE — Telephone Encounter (Signed)
I called pt. She says that she is going to have her VNS removed in [place] soon. The surgeon told her that it could be put back in her if she desires at another point in time. Pt says that the was "never really turned on" and it was only turned on briefly but she was experiencing too many side effects from it. Pt said her VNS is "not working" with her university.  Pt needs a letter for her [employer] that says: "Ms. Wearing may be in an auditorium or large classroom 3 times per week for 50 minutes with powerpoint and short videos. Ms. Regier is requesting that all unnecessary colors and animations be removed from the power-point slides.  This letter needs to be faxed to [phone] ATTN: Nicanor Alcon.

## 2016-10-20 NOTE — Telephone Encounter (Signed)
If Mr. Lehmkuhl can tolerate power point presentations-video clips for 50 minutes daily, there should be no problem to write this letter. I believe that she could remove herself from the auditorium if she feels that presentations  over power point exceed her tolerance .   I am still at a loss as how she would get a VNS stimulator that would not be started and then would be removed again?  [name],  please write  the letter for the patient and fax it to Mrs. Clarene Essex.

## 2016-10-20 NOTE — Telephone Encounter (Signed)
Pt called in stating she needs documentation stating she is allowed to use powerpoint in certain context. She is requesting a call to go into more detail. [phone]

## 2016-10-21 NOTE — Telephone Encounter (Signed)
Pt returned my call and thanked me for the first letter. However, she is requesting that the letter not say "Ms. Leger is requesting" but instead just say that "All unnecessary colors and animations should be removed from the power-point slides." I read the letter to the pt and she agreed that this is acceptable. She asked that I fax it again to the number provided in the last phone call.  I will inform Dr. Brett Fairy of this change and ask her to sign the new copy of this letter.

## 2016-10-21 NOTE — Telephone Encounter (Signed)
I called pt to advise her that the letter was faxed to the fax number she provided and ATTN: Nicanor Alcon.  No answer, left a message asking her to call me back.

## 2016-10-21 NOTE — Telephone Encounter (Signed)
I spoke to Dr. Brett Fairy. She agreed to sign this letter. I faxed signed letter to Nicanor Alcon at the fax number given previously. Received a receipt of confirmation.

## 2016-11-12 DIAGNOSIS — F509 Eating disorder, unspecified: Secondary | ICD-10-CM | POA: Diagnosis not present

## 2016-11-12 DIAGNOSIS — F4323 Adjustment disorder with mixed anxiety and depressed mood: Secondary | ICD-10-CM | POA: Diagnosis not present

## 2016-11-14 DIAGNOSIS — F509 Eating disorder, unspecified: Secondary | ICD-10-CM | POA: Diagnosis not present

## 2016-11-14 DIAGNOSIS — F4323 Adjustment disorder with mixed anxiety and depressed mood: Secondary | ICD-10-CM | POA: Diagnosis not present

## 2016-12-10 DIAGNOSIS — L700 Acne vulgaris: Secondary | ICD-10-CM | POA: Diagnosis not present

## 2017-01-13 DIAGNOSIS — S43012A Anterior subluxation of left humerus, initial encounter: Secondary | ICD-10-CM | POA: Diagnosis not present

## 2017-01-13 DIAGNOSIS — S43012D Anterior subluxation of left humerus, subsequent encounter: Secondary | ICD-10-CM | POA: Diagnosis not present

## 2017-01-13 DIAGNOSIS — Z8669 Personal history of other diseases of the nervous system and sense organs: Secondary | ICD-10-CM | POA: Diagnosis not present

## 2017-01-13 DIAGNOSIS — R569 Unspecified convulsions: Secondary | ICD-10-CM | POA: Diagnosis not present

## 2017-01-13 DIAGNOSIS — W19XXXA Unspecified fall, initial encounter: Secondary | ICD-10-CM | POA: Diagnosis not present

## 2017-01-13 DIAGNOSIS — G40909 Epilepsy, unspecified, not intractable, without status epilepticus: Secondary | ICD-10-CM | POA: Diagnosis not present

## 2017-01-13 DIAGNOSIS — H53149 Visual discomfort, unspecified: Secondary | ICD-10-CM | POA: Diagnosis not present

## 2017-01-13 DIAGNOSIS — M25512 Pain in left shoulder: Secondary | ICD-10-CM | POA: Diagnosis not present

## 2017-01-13 DIAGNOSIS — Z91040 Latex allergy status: Secondary | ICD-10-CM | POA: Diagnosis not present

## 2017-01-20 ENCOUNTER — Telehealth: Payer: Self-pay | Admitting: Neurology

## 2017-01-20 DIAGNOSIS — G40019 Localization-related (focal) (partial) idiopathic epilepsy and epileptic syndromes with seizures of localized onset, intractable, without status epilepticus: Secondary | ICD-10-CM

## 2017-01-20 NOTE — Telephone Encounter (Signed)
Patient is calling regarding a letter she needs for her school and needs documentation. She would not go into any more details. She requests to discuss this with [facility]. I advised her Beverlee Nims is working with Dr. Brett Fairy but she only wants to speak to [name] because she is familiar with her situation.

## 2017-01-20 NOTE — Telephone Encounter (Signed)
Please call this pt and discuss. Her number is [phone].
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
## 2017-01-21 MED ORDER — LAMOTRIGINE ER 200 MG PO TB24: ORAL_TABLET | ORAL | 4 refills | Status: DC

## 2017-01-21 MED ORDER — LACOSAMIDE 50 MG PO TABS: 50.0000 mg | ORAL_TABLET | Freq: Two times a day (BID) | ORAL | 3 refills | Status: DC

## 2017-01-21 NOTE — Telephone Encounter (Signed)
Called patient, she confirmed having had a 9 minute seizure after flickering lights.  - her typical trigger. She has not yet seen a local neurologist.  She has a 9 week waiting period to be seen at the [place] office.   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   She will continue with current medication. She cannot afford a caretaker, and her insurance can not cover.   She likes to see a neurologist in  [place], would have easier transportation to that location, will provid me with names of neurologist.  I let her draft a letter , and see if we can support this document. .    CD

## 2017-01-21 NOTE — Addendum Note (Signed)
Addended by: Larey Seat on: 01/21/2017 05:12 PM   Modules accepted: Orders

## 2017-01-26 ENCOUNTER — Telehealth: Payer: Self-pay

## 2017-01-26 NOTE — Telephone Encounter (Signed)
LM for patient, stating that we have a prescription for her that Dr. Brett Fairy would like to mail to the patient. I asked patient to call back and provide an address she would like this prescription to be mailed to.

## 2017-01-27 NOTE — Telephone Encounter (Signed)
Patient called and advised she would like to pick up the Rx at our office instead of by mail since she is in town. Beverlee Nims will put Rx at the front desk.

## 2017-01-28 ENCOUNTER — Other Ambulatory Visit: Payer: Self-pay | Admitting: Neurology

## 2017-01-28 DIAGNOSIS — G40019 Localization-related (focal) (partial) idiopathic epilepsy and epileptic syndromes with seizures of localized onset, intractable, without status epilepticus: Secondary | ICD-10-CM

## 2017-01-30 ENCOUNTER — Telehealth: Payer: Self-pay | Admitting: *Deleted

## 2017-01-30 ENCOUNTER — Other Ambulatory Visit: Payer: Self-pay | Admitting: Neurology

## 2017-01-30 DIAGNOSIS — G40019 Localization-related (focal) (partial) idiopathic epilepsy and epileptic syndromes with seizures of localized onset, intractable, without status epilepticus: Secondary | ICD-10-CM

## 2017-01-30 MED ORDER — LACOSAMIDE 50 MG PO TABS: 50.0000 mg | ORAL_TABLET | Freq: Two times a day (BID) | ORAL | 3 refills | Status: DC

## 2017-01-30 MED ORDER — LACOSAMIDE 50 MG PO TABS: 50.0000 mg | ORAL_TABLET | Freq: Two times a day (BID) | ORAL | 0 refills | Status: DC

## 2017-01-30 NOTE — Telephone Encounter (Signed)
Rx. printed per CD request/fim

## 2017-01-30 NOTE — Telephone Encounter (Signed)
Was unable to print on POD printer, Ival Bible helped me. I now have a physical paper copy for scheudled prescriptions - printer wil go down for the next 2 hours to be rerouted, b per Butch Penny. CD

## 2017-02-02 NOTE — Telephone Encounter (Signed)
Fax confirmation [place] to [facility].  [phone].  LMVM for pt on Friday this is where I faxed.

## 2017-04-20 NOTE — Progress Notes (Signed)
Pt arrived in our lobby today asking for documentation for a letter for her school, [employer]. She reports that a lawyer has wrote this letter and needs Dr. Edwena Felty signature.  "[name] has been a patient at [facility] since 2009. 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 Her seizure disorder is non-retractable, and she experiences seizures on a daily reoccurring basis. She has severe photosensitivity and cannot typically be under florescent lights, incandescent lights, flickering lights, technology screen light for any extended period of time, and exposure to these triggers can cause immediate seizures. [name] also has profound hearing loss, muscle weakness, ataxia, difficulties with coordination, and short term memory loss issues at times caused by her seizures.   [name] can be in a large auditorium space with modified powerpoint slides for an hour or less. To do so, [name] typically schedules such events around and in accordance to her medication schedule.    [name] has been prescribed IM Ativan which she administers to herself as needed to help control seizures.   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 In particular her left and right shoulder dislocate frequently, very painfully, during seizures. She needs surgery to help alleviate the issue, but orthopedic surgeons are not able to operate because of the frequency of her seizures.   [name] worked with our office in September 2017, regarding liability/legal concerns around the VNS implant with her school, and documentation for release forms.   [name] had a VNS implant, per our recommendation, last summer (July). Implant was removed November 2017 (further history of her implants is not warranted in regards to the purposes of this documentation).  To use the previous implant, before, during or after a seizure, an independent layperson would place a stimulator magnet over the implant for 60 seconds to 3 minutes to activate the device. The device was located in her lower left breast/chest wall with another access point in her right lower breast/chest wall. The implant could not be used through clothing due to its modification.   The VNS implant is independently verified."   I spoke to Dr. Brett Fairy, who has some concerns regarding the wording of this letter. I brought pt to a room to discuss with Dr. Brett Fairy. The following was agreed upon by Dr. Brett Fairy and pt. Pt signed a MR release for me to fax this letter to Nicanor Alcon at [phone]. Pt asked me to fax this letter to Nicanor Alcon, and I informed her that a MR release will need to be signed since Nicanor Alcon is not on her DPR. Pt filled out a MR release for me to fax this letter to Nicanor Alcon at [place] and asked that it be expired on 04/21/2017.A copy of the letter that was agreed upon by pt and Dr. Brett Fairy was provided to the pt. Received a receipt of confirmation after faxing letter to [facility].  Letter is as follows : "To whom it may concern:  [name] has been a patient at [facility] since 2009. 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 Her seizure disorder is non-retractable, and she experiences seizures on a daily reoccurring basis. She has severe photosensitivity and cannot typically be under florescent lights, incandescent lights, flickering lights, technology screen light for any extended period of time, and  exposure to these triggers can cause immediate seizures. [name] also has profound hearing loss, muscle weakness, ataxia, difficulties with coordination, and short term memory loss issues at times caused by her seizures.    [name] can be in a large auditorium space with modified powerpoint slides for an hour or less. To do so, [name] typically schedules such events around and in accordance to her medication schedule.    [name] has been prescribed IM Ativan which she administers to herself as needed to help control seizures.   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 In particular her left and right shoulder dislocate frequently, very painfully, during seizures. She needs surgery to help alleviate the issue, but orthopedic surgeons are are not willing to operate because [name] will not heal and her shoulder will dislocate again, nonetheless.  [name] worked with our office in September 2017, regarding liability/legal concerns around the VNS implant with her school, and documentation for release forms.   To use a VNS implant, before, during or after a seizure, an independent layperson would place a stimulator magnet over the implant for 60 seconds to 3 minutes to activate the device. The device was located in her lower left breast/chest wall with another access point in her right lower breast/chest wall. The implant could not be used through clothing due to its modification.      Sincerely,    Larey Seat, MD"  A copy of this letter and MR release form was sent to medical records to be scanned into this pt's chart.

## 2017-04-23 ENCOUNTER — Telehealth: Payer: Self-pay | Admitting: Neurology

## 2017-04-23 ENCOUNTER — Telehealth: Payer: Self-pay

## 2017-04-23 NOTE — Telephone Encounter (Signed)
error 

## 2017-04-23 NOTE — Telephone Encounter (Signed)
Received this email from the pt:  "I'm sorry to be a bother! I just got another call from Dr.Bell's. office and thought it easier to pass the information along here then call again. Apparently they joined some kind of health group, and now only take referrals from neurologist for neurosurgery from within that group. So I would have to meet with one of their neurologist first and be treated as a new patient. But there first available opening to just see their neurologist isn't until July- so that's most of the time window I would have to do this in.   They mentioned that someone named Dr. Kathyrn Sheriff (not totally sure if this is spelled right) also does VNS implants and is [place] and isn't with their health group, and to try placing a referral there. He's with [facility]. Would you mind sending a referral there if you get the chance?  Thanks so much, [name] "  I responded with:  " No worries, [name]. I will pass this information along to Dr. Brett Fairy. Thanks for this information; I had not heard this so I will update my records accordingly as well!  Take care,  [name]"
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
## 2017-04-23 NOTE — Telephone Encounter (Signed)
Pt called wanting to know if Dr Brett Fairy can send a referral to [facility] for her, this is what was recommended to her by a Dr Jimmye Norman
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
## 2017-04-23 NOTE — Telephone Encounter (Signed)
Pt called again for the status of referral, she was advised Dr Brett Fairy has been with pt's and once available to process the referral will then be sent over.

## 2017-04-23 NOTE — Telephone Encounter (Signed)
Pt said Dr Gloriann Loan is not accepting pt's outside of [facility]. Please call to refer to another provider

## 2017-04-23 NOTE — Telephone Encounter (Signed)
I spoke to Dr. Brett Fairy regarding this referral and phone call and Dr. Brett Fairy informed me that she will handle this referral/call tomorrow. Will send to her for review.

## 2017-04-24 NOTE — Telephone Encounter (Signed)
I left VM and explained that I will ask for Dr Lars Masson herlp, that I have not used Dr Doristine Section services yet and would like to contact him first. CD

## 2017-04-27 ENCOUNTER — Encounter: Payer: Self-pay | Admitting: Neurology

## 2017-04-27 ENCOUNTER — Telehealth: Payer: Self-pay

## 2017-04-27 DIAGNOSIS — E8842 MERRF syndrome: Secondary | ICD-10-CM

## 2017-04-27 DIAGNOSIS — G40309 Generalized idiopathic epilepsy and epileptic syndromes, not intractable, without status epilepticus: Principal | ICD-10-CM

## 2017-04-27 NOTE — Telephone Encounter (Signed)
Received another email from this pt today at 1:03pm.  "As a follow up, the school investigator person has said that the words 'last July' can be removed if wanted. All they want is just clarity that the magnet touched my skin for 60 seconds-3 minutes.    This isn't a formal legal investigation with the law or anything. Its just the honor [employer] within my university who hears faculty complaints and then addresses them, and has an 'inner university' procedure they follow with all complaints when placed. So it is these folks telling me what is needed. Luckily after this, they have told me I do not have to provide anything further.    If Dr. Brett Fairy wouldn't mind signing this I would appreciate it. If anyone gets the chance today, I would appreciate someone just letting me know. I'm sorry to be a total bother- I just have school people who keep calling me and its a bit stressful.    Thanks, [name]"  I responded to the pt:  "Hi [name],   No problem, I will send this letter to Dr. Brett Fairy. Sorry I didn't respond right away;  I have been very busy on this Monday!    [place]"

## 2017-04-27 NOTE — Progress Notes (Unsigned)
  I spoke to [name] about a referral to Dr. Ellouise Newer. I would like her to see [name] for a second opinion or a transfer of care. Mrs., has voiced interest in resuming VNS treatment. CD  Please transcribe the following note in letter format  for this patient . I spoke to [name] and she needs the following clarifications. CD   "I would like to recommend that the current investigation time frame be substantially shortened for [name]. [name] reports sleeping poorly due to stress. Stress and lack of sleep are known seizure triggers in many patients, and in [place]. She would like to complete her course work remotely for the rest of Spring semester 2018 as she does not feel this is a medically healthy learning environment for her at present.   [name] has requested [place] to further clarify instructions in conjunction with the last letter issued May 7th. To use any VNS implant , an independent layperson would hold the magnet to the implanted device , press the magnet directly on/against her skin for 60 seconds to 3 minutes. This "swipe" movement and interrogation tool needs close contact to the skin. "

## 2017-04-27 NOTE — Telephone Encounter (Signed)
Patient called office stating she is returning Dr. Edwena Felty phone call from Friday.  Please call
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
## 2017-04-27 NOTE — Telephone Encounter (Signed)
  I spoke to [name] about a referral to Dr. Ellouise Newer. I would like her to see [name] for a second opinion or a transfer of care. Mrs., has voiced interest in resuming VNS treatment. CD  Please transcribe the following note in letter format  for this patient . I spoke to [name] and she needs the following clarifications. CD   "I would like to recommend that the current investigation time frame be substantially shortened for [name]. [name] sleeping poorly due to stress. Stress and lack of sleep are known seizure triggers in many patients, and in [name]. She would like to complete her course work remotely for the rest of Spring semester 2018 as she does not feel this is a medically healthy learning environment for her at present.   [name] has requested [place] to further clarify instructions in conjunction with the last letter issued May 7th. To use any VNS implant , an independent layperson would hold the magnet to the implanted device , press the magnet directly on/against her skin for 60 seconds to 3 minutes. This "swipe" movement and interrogation tool needs close contact to the skin. "

## 2017-04-27 NOTE — Telephone Encounter (Signed)
Left VM with patient- I will contact dr Delice Lesch. CD

## 2017-04-27 NOTE — Telephone Encounter (Signed)
Received another letter from this pt at 4:01pm.:  "Hi [name],   Sorry to be such a bother! If there is any chance this might be something that will be finished/I can pick up today would you let me know? I'm just trying very hard to get everything to calm down and was hoping to send this to my school today to ease a lot of folks. Trying to give them a timeline (so they will at least stop calling me!).   Thanks again, [name]"

## 2017-04-27 NOTE — Telephone Encounter (Signed)
Received another email from this pt today at 1:16pm.  "Hi [name],   Thanks so much. I think it would be better if the reference to July is left in, but its okay to take it out if its an issue. Sorry, I am just having a bit of an anxiety attack morning. 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 The school keeps calling me repetitively and it just freaks me out!   If Dr. Brett Fairy would sign the letter with the July wording reference in it, that would be great. If you could just shoot me an email (no need to call if busy!) and let me know if its been signed, I will just swing by the office today and pick it up. No need to fax.   I hope the busy morning is going okay!  Thanks so much, [name]"
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
## 2017-04-27 NOTE — Telephone Encounter (Signed)
The letter was placed on a letterhead, and Dr. Brett Fairy signed this letter.  I emailed the pt back with this:  "Hi [name],  Dr. Brett Fairy has written the letter and it is ready for pick up at the front desk. We close at 5pm; I hope you can make it this afternoon! Sorry this took so long.  Deziree Mokry"

## 2017-04-27 NOTE — Telephone Encounter (Signed)
Received this email back from the pt today at 4:44pm.  "Okay thanks so much- on my way over. Will do my best to get there before 5!   Thank you both for your help, [place]"

## 2017-04-30 ENCOUNTER — Encounter: Payer: Self-pay | Admitting: Neurology

## 2017-06-08 ENCOUNTER — Telehealth: Payer: Self-pay | Admitting: Neurology

## 2017-06-08 NOTE — Telephone Encounter (Signed)
Patient called office in reference to school letter Dr. Brett Fairy did on 04/20/17 she is needing it faxed to [phone] ATTN: Daylene Posey (school related)

## 2017-06-08 NOTE — Telephone Encounter (Signed)
Patient called back in reference to letter being faxed she states it will need to have a cover sheet with a signature and where the letter is coming from.  FYI

## 2017-06-08 NOTE — Telephone Encounter (Signed)
Pt stop by the office to signed a one day release form for a letter. The letter was faxed to [facility] @ [phone].

## 2017-06-08 NOTE — Telephone Encounter (Signed)
Pt does not have Daylene Posey listed on her DPR, and therefore, I am uncomfortable faxing this letter. Will send to [name] in medical records to discuss with the pt. Pt may need to come and sign another DPR or pick up the letter.

## 2017-06-26 ENCOUNTER — Ambulatory Visit (INDEPENDENT_AMBULATORY_CARE_PROVIDER_SITE_OTHER): Payer: PRIVATE HEALTH INSURANCE | Admitting: Neurology

## 2017-06-26 ENCOUNTER — Encounter: Payer: Self-pay | Admitting: Neurology

## 2017-06-26 ENCOUNTER — Other Ambulatory Visit: Payer: Self-pay | Admitting: Neurology

## 2017-06-26 VITALS — BP 102/78 | HR 94 | Ht 64.0 in | Wt 105.0 lb

## 2017-06-26 DIAGNOSIS — G40309 Generalized idiopathic epilepsy and epileptic syndromes, not intractable, without status epilepticus: Secondary | ICD-10-CM

## 2017-06-26 DIAGNOSIS — E8842 MERRF syndrome: Secondary | ICD-10-CM

## 2017-06-26 DIAGNOSIS — G40019 Localization-related (focal) (partial) idiopathic epilepsy and epileptic syndromes with seizures of localized onset, intractable, without status epilepticus: Secondary | ICD-10-CM

## 2017-06-26 MED ORDER — LACOSAMIDE 50 MG PO TABS: 50.0000 mg | ORAL_TABLET | Freq: Two times a day (BID) | ORAL | 3 refills | Status: DC

## 2017-06-26 MED ORDER — LAMOTRIGINE 200 MG PO TABS: 200.0000 mg | ORAL_TABLET | Freq: Two times a day (BID) | ORAL | 3 refills | Status: DC

## 2017-06-26 MED ORDER — ZONISAMIDE 100 MG PO CAPS
ORAL_CAPSULE | ORAL | 6 refills | Status: DC
Start: 2017-06-26 — End: 2017-07-24

## 2017-06-26 MED ORDER — LEVETIRACETAM 500 MG PO TABS: 500.0000 mg | ORAL_TABLET | Freq: Three times a day (TID) | ORAL | 6 refills | Status: DC

## 2017-06-26 NOTE — Progress Notes (Addendum)
NEUROLOGY CONSULTATION NOTE  [name] MRN: [id number] DOB: 1991/01/06  Referring provider: Dr. Asencion Partridge Dohmeier Primary care provider: Dr. Phoebe Sharps  Reason for consult:  Second opinion for seizures  Dear Dr Brett Fairy:  Thank you for your kind referral of [name] for consultation of the above symptoms. Although her history is well known to you, please allow me to reiterate it for the purpose of our medical record. She has a seizure dog with her today. Records and images were personally reviewed where available.  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 She reports that she had a lot of instances of "spacing out" as a teenager, but was not diagnosed with seizures until she had her first generalized tonic-clonic seizure at age 26. She initially did not recognize any warning symptoms, but now feels lightheaded/dizzy "like a white noise," both arms are numb and tingly, then loses consciousness. Witnesses have told her she about complex partial seizures where her eyes are open, she would vocalize, smack her lips, and fidget. After a seizure, her left side feels weaker. She reports that GTCs mostly occur when she is sick or sleep deprived, last GTC was in June. She usually has a couple of complex partial seizures before having a convulsion. One time she had 4 GTCs in a week. She has dislocated her shoulder "hundreds of times" since age 26, particularly the left shoulder. She has frequent focal seizures occurring at least once a week for the past 1.5-2 years. She reports they cluster for several days in a row, sometimes a couple of times a day.  She had one yesterday and the day prior. Previous to this was a week ago. She has been travelling recently and has more seizures when stressed or sleep deprived. She has noticed that sometimes one of her limbs would jerk a little bit. She would have occasional brief twitching in her arms like a tremor with no associated aura or convulsion, more in the evening hours. She has nocturnal seizures where she wakes up with urinary incontinence every 2 weeks or so. She had tried Tegretol in the past, which was ineffective. She is currently on Lamotrigine 200mg  BID, Keppra 1000, 1000mg , 500mg , and Vimpat 50mg  BID. She has been taking Lamotrigine since age 26, no side effects. She feels different if she takes it an hour late. She has been on Keppra since age 26, and Vimpat was started 1.5 years ago. She feels Vimpat has been the most helpful by far, with less severe convulsions. She gets a little dizzy and nauseated after taking Vimpat. She had a VNS placed for 3.5 weeks, then had it taken out because there were no resources for VNS in [place], [place]. She was told she had to go to [place] 2 hours away to interrogate the device, and special permission was needed for people in school to help swipe her magnet.   She occasionally has gustatory hallucinations of tasting cheese. She gets a lot of nausea but they are not clearly related to the seizures. For the past couple of years, she has headaches a couple of times a week, usually over the frontal and temporal regions where she gets dizzy, vision "gets  funky," with nausea and light sensitivity (sometimes triggering headaches).  Naproxen helps. Pain is intense for about 20-30 minutes, then hurts for hours. She has neck and back pain. No clear relation to menstrual cycle, her periods are very irregular, she reports amenorrhea for 4 years at one point and has not had this checked. She constantly feels her left side has less sensation, she has difficulty typing with her left hand. She  is currently in a graduate program in [place], [place] and gets all As, but has a lot of short term memory issues that affect her classes. She has some accommodations where lessons are transcribed for her. She has been having a lot of difficulties with her professors in school. She has a seizure dog who can detect the GTCs but only detect the complex partial seizure "50% of the time." She is concerned that she is getting sick all the time, she is sick at least once a month "I catch everything," and has started to have epistaxis.   Epilepsy Risk Factors:  MERRF. She reports her sister was tested and is a carrier. Otherwise she had a normal birth and early development.  There is no history of febrile convulsions, CNS infections such as meningitis/encephalitis, significant traumatic brain injury, neurosurgical procedures, or family history of seizures.  Prior AEDs: Tegretol  Laboratory Data:     Chemistry      Component Value Date/Time   NA 140 06/27/2017 0507   NA 137 06/29/2013 1437   K 4.0 06/27/2017 0507   CL 109 06/27/2017 0507   CO2 24 06/27/2017 0507   BUN 7 06/27/2017 0507   BUN 17 06/29/2013 1437   CREATININE 0.58 06/27/2017 0507      Component Value Date/Time   CALCIUM 9.0 06/27/2017 0507   ALKPHOS 41 06/27/2017 0507   AST 23 06/27/2017 0507   ALT 13 (L) 06/27/2017 0507   BILITOT 0.7 06/27/2017 0507     EEGs: 08/17/2009 at [facility] was a normal wake EEG 09/09/2009 24-hour EEG at [facility] was normal. Pushbutton events not associated with EEG change MRI: MRI brain without contrast done 04/17/2011 reported as normal, images unavailable for review   PAST MEDICAL HISTORY: Past Medical History:  Diagnosis Date  . Deafness    progressive  . Fatigue   . Hearing loss   . History of sprained wrist   . Hypoglycemia 06/12/2014  . Migraine headache   . Mononucleosis 2009   no lymphatic symptoms  . Muscle weakness of lower extremity    b/l  . Seizure disorder ([place]) 2010  . Seizures ([place])   .  Shoulder subluxation, left     PAST SURGICAL HISTORY: Past Surgical History:  Procedure Laterality Date  . LAPAROSCOPIC APPENDECTOMY  11/26/2011   Procedure: APPENDECTOMY LAPAROSCOPIC;  Surgeon: Gayland Curry, MD;  Location: [facility];  Service: General;  Laterality: N/A;  . OVARIAN CYST REMOVAL  2/11   2 removed    MEDICATIONS: Current Outpatient Prescriptions on File Prior to Visit  Medication Sig Dispense Refill  . ALPRAZolam (XANAX) 0.25 MG tablet Take 1 tablet (0.25 mg total) by mouth at bedtime as needed for sleep. 30 tablet 0  . CO ENZYME Q-10 PO Take by mouth 2 (two) times daily.    Marland Kitchen lacosamide (VIMPAT) 50 MG TABS tablet Take 1 tablet (50 mg total) by mouth 2 (two) times daily. 180 tablet 0  . lamoTRIgine (LAMICTAL) 200 MG tablet Take 1 tablet (200 mg total) by mouth 2 (two) times daily.  180 tablet 3  . LamoTRIgine XR 200 MG TB24 TAKE 1 TABLET (200 MG TOTAL) BY MOUTH 2 (TWO) TIMES DAILY. 180 tablet 4  . levETIRAcetam (KEPPRA) 500 MG tablet Take 1 tablet (500 mg total) by mouth 3 (three) times daily. Take two tablets in the morning and the evening and one tablet at lunchtime. 90 tablet 3  . LORazepam (ATIVAN) 0.5 MG tablet Take 1 tablet (0.5 mg total) by mouth as needed for anxiety. 30 tablet 1  . LORazepam (ATIVAN) 2 MG/ML injection INJECT 1 ML INTO THE MUSCLE ONCE 10 mL 0   No current facility-administered medications on file prior to visit.     ALLERGIES: Allergies  Allergen Reactions  . Shellfish Allergy   . Cefuroxime Axetil     REACTION: rash, throat swells shut  . Midazolam Nausea And Vomiting    FAMILY HISTORY: Family History  Problem Relation Age of Onset  . Polymyalgia rheumatica Mother   . Heart disease Maternal Grandmother   . Cancer Maternal Grandmother   . Heart disease Maternal Grandfather   . Stroke Maternal Grandfather   . Diabetes Maternal Grandfather   . COPD Maternal Grandfather   . Kidney failure Maternal Grandfather   . Heart disease Paternal  Grandmother   . Heart disease Paternal Grandfather   . Migraines Paternal Grandfather     SOCIAL HISTORY: Social History   Social History  . Marital status: Single    Spouse name: N/A  . Number of children: 0  . Years of education: college   Occupational History  . Not on file.   Social History Main Topics  . Smoking status: Never Smoker  . Smokeless tobacco: Never Used  . Alcohol use No  . Drug use: No  . Sexual activity: Not on file   Other Topics Concern  . Not on file   Social History Narrative   Patient is single and lives at home with her parents when not in school.   Patient is currently attending Graduate school.   Patient right-handed.   Patient does not drink any caffeine.    REVIEW OF SYSTEMS: Constitutional: No fevers, chills, or sweats, no generalized fatigue, change in appetite Eyes: No visual changes, double vision, eye pain Ear, nose and throat: No hearing loss, ear pain, nasal congestion, sore throat Cardiovascular: No chest pain, palpitations Respiratory:  No shortness of breath at rest or with exertion, wheezes GastrointestinaI: No nausea, vomiting, diarrhea, abdominal pain, fecal incontinence Genitourinary:  No dysuria, urinary retention or frequency Musculoskeletal:  + neck pain, back pain Integumentary: No rash, pruritus, skin lesions Neurological: as above Psychiatric: No depression, insomnia, anxiety Endocrine: No palpitations, fatigue, diaphoresis, mood swings, change in appetite, change in weight, increased thirst Hematologic/Lymphatic:  No anemia, purpura, petechiae. Allergic/Immunologic: no itchy/runny eyes, nasal congestion, recent allergic reactions, rashes  PHYSICAL EXAM: Vitals:   06/26/17 1029  BP: 102/78  Pulse: 94   General: No acute distress Head:  Normocephalic/atraumatic Eyes: Fundoscopic exam shows bilateral sharp discs, no vessel changes, exudates, or hemorrhages Neck: supple, no paraspinal tenderness, full range of  motion Back: No paraspinal tenderness Heart: regular rate and rhythm Lungs: Clear to auscultation bilaterally. Vascular: No carotid bruits. Skin/Extremities: No rash, no edema. Left shoulder step off from numerous dislocations Neurological Exam: Mental status: alert and oriented to person, place, and time, no dysarthria or aphasia, Fund of knowledge is appropriate.  Remote memory intact. 0/3 delayed recall. Attention and concentration are normal.    Able to name objects and  repeat phrases. Cranial nerves: CN I: not tested CN II: pupils equal, round and reactive to light, visual fields intact, fundi unremarkable. CN III, IV, VI:  full range of motion, no nystagmus, no ptosis CN V: facial sensation intact CN VII: upper and lower face symmetric CN VIII: hearing intact to finger rub CN IX, X: gag intact, uvula midline CN XI: sternocleidomastoid and trapezius muscles intact CN XII: tongue midline Bulk & Tone: normal, no fasciculations. Motor: 5/5 throughout with no pronator drift. Sensation: decreased pin and cold on left UE and LE, intact vibration and joint position sense.  Romberg test negative Deep Tendon Reflexes: brisk +2 throughout, no ankle clonus, negative Hoffman sign Plantar responses: downgoing bilaterally Cerebellar: no incoordination on finger to nose, heel to shin. No dysdiadochokinesia Gait: narrow-based and steady, able to tandem walk adequately. 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 She has had progressive hearing loss since age 49, myoclonic jerks, as well as focal seizures with impaired awareness occurring 1-2 times a week and GTCs, last GTC was in June. We discussed seizures with MERRF, there are no guidelines available for the management of epilepsy in MERRF syndrome, but  general recommendations for the treatment of mitochondrial epilepsy recommend AEDs of choice are levetiracetam, topiramate, zonisamide, and benzodiazepines. Perampanel may be considered as well. Mitochondrion-toxic agents, including mitochondrion-toxic AEDs, such as valproate, carbamazepine, phenytoin, and barbiturates, should be avoided as well as AEDs potentially enhancing the frequency of myoclonus, such as phenytoin, carbamazepine, lamotrigine, vigabatrin, tiagabine, gabapentin, pregabalin, and oxcarbazepine. Since she is having frequent headaches as well, we agreed to start zonisamide with uptitration schedule. Side effects were discussed. Once therapeutic, she will slowly start tapering down the Lamotrigine. Continue Keppra and Vimpat. We may increase Vimpat in the future as well as she feels this has been helpful. We discussed MERRF, check EKG. She will need a PCP in [place]. She will be referred to Medical Genetics at [facility]. She does not drive. No pregnancy plans at this time. She will follow-up before she travels back to [place] for school.   Thank you for allowing me to participate in the care of this patient. Please do not hesitate to call for any questions or concerns.   Ellouise Newer, M.D.  CC: Dr. Brett Fairy, Dr. Leanne Chang

## 2017-06-26 NOTE — Patient Instructions (Addendum)
1. Start Zonisamide 100mg : Take 1 capsule at night for 2 weeks, then increase to 2 capsules at night for 2 weeks, then increase to 3 capsules at night and continue 2. After 6 weeks, start reducing Lamictal 200mg : Take 1/2 tablet in AM, 1 tablet in PM for 1 week, then 1/2 tablet twice a day and stay on this until your follow-up with me 3. Continue all other medications 4. Schedule 12-lead EKG 5. Refer to Medical Genetics at [facility] for MERRF 6. Follow-up on 8/31 at 11:30am, call for any changes  Seizure Precautions: 1. If medication has been prescribed for you to prevent seizures, take it exactly as directed.  Do not stop taking the medicine without talking to your doctor first, even if you have not had a seizure in a long time.   2. Avoid activities in which a seizure would cause danger to yourself or to others.  Don't operate dangerous machinery, swim alone, or climb in high or dangerous places, such as on ladders, roofs, or girders.  Do not drive unless your doctor says you may.  3. If you have any warning that you may have a seizure, lay down in a safe place where you can't hurt yourself.    4.  No driving for 6 months from last seizure, as per [facility].   Please refer to the following link on the [place] website for more information: http://www.epilepsyfoundation.org/answerplace/Social/driving/drivingu.cfm   5.  Maintain good sleep hygiene. Avoid alcohol  6.  Notify your neurology if you are planning pregnancy or if you become pregnant.  7.  Contact your doctor if you have any problems that may be related to the medicine you are taking.  8.  Call 911 and bring the patient back to the ED if:        A.  The seizure lasts longer than 5 minutes.       B.  The patient doesn't awaken shortly after the seizure  C.  The patient has new problems such as difficulty seeing, speaking or moving  D.  The patient was injured during the seizure  E.  The patient  has a temperature over 102 F (39C)  F.  The patient vomited and now is having trouble breathing

## 2017-06-27 ENCOUNTER — Emergency Department (HOSPITAL_COMMUNITY): Payer: PRIVATE HEALTH INSURANCE

## 2017-06-27 ENCOUNTER — Other Ambulatory Visit: Payer: Self-pay

## 2017-06-27 ENCOUNTER — Encounter (HOSPITAL_COMMUNITY): Payer: Self-pay | Admitting: Emergency Medicine

## 2017-06-27 ENCOUNTER — Emergency Department (HOSPITAL_COMMUNITY)
Admission: EM | Admit: 2017-06-27 | Discharge: 2017-06-27 | Disposition: A | Discharge status: Home / Self Care | Payer: PRIVATE HEALTH INSURANCE | Attending: Emergency Medicine | Admitting: Emergency Medicine

## 2017-06-27 DIAGNOSIS — X58XXXA Exposure to other specified factors, initial encounter: Secondary | ICD-10-CM | POA: Insufficient documentation

## 2017-06-27 DIAGNOSIS — Y999 Unspecified external cause status: Secondary | ICD-10-CM | POA: Insufficient documentation

## 2017-06-27 DIAGNOSIS — J45909 Unspecified asthma, uncomplicated: Secondary | ICD-10-CM | POA: Insufficient documentation

## 2017-06-27 DIAGNOSIS — S43015A Anterior dislocation of left humerus, initial encounter: Secondary | ICD-10-CM

## 2017-06-27 DIAGNOSIS — Z8669 Personal history of other diseases of the nervous system and sense organs: Secondary | ICD-10-CM

## 2017-06-27 DIAGNOSIS — Y929 Unspecified place or not applicable: Secondary | ICD-10-CM | POA: Insufficient documentation

## 2017-06-27 DIAGNOSIS — R569 Unspecified convulsions: Secondary | ICD-10-CM

## 2017-06-27 DIAGNOSIS — Z79899 Other long term (current) drug therapy: Secondary | ICD-10-CM | POA: Insufficient documentation

## 2017-06-27 DIAGNOSIS — Y939 Activity, unspecified: Secondary | ICD-10-CM | POA: Insufficient documentation

## 2017-06-27 DIAGNOSIS — S4992XA Unspecified injury of left shoulder and upper arm, initial encounter: Secondary | ICD-10-CM | POA: Diagnosis present

## 2017-06-27 LAB — URINALYSIS, ROUTINE W REFLEX MICROSCOPIC
BILIRUBIN URINE: NEGATIVE
GLUCOSE, UA: NEGATIVE mg/dL
HGB URINE DIPSTICK: NEGATIVE
Ketones, ur: NEGATIVE mg/dL
Leukocytes, UA: NEGATIVE
Nitrite: NEGATIVE
PH: 8 (ref 5.0–8.0)
Protein, ur: NEGATIVE mg/dL
SPECIFIC GRAVITY, URINE: 1.005 (ref 1.005–1.030)

## 2017-06-27 LAB — COMPREHENSIVE METABOLIC PANEL
ALBUMIN: 4 g/dL (ref 3.5–5.0)
ALK PHOS: 41 U/L (ref 38–126)
ALT: 13 U/L — AB (ref 14–54)
ANION GAP: 7 (ref 5–15)
AST: 23 U/L (ref 15–41)
BILIRUBIN TOTAL: 0.7 mg/dL (ref 0.3–1.2)
BUN: 7 mg/dL (ref 6–20)
CALCIUM: 9 mg/dL (ref 8.9–10.3)
CO2: 24 mmol/L (ref 22–32)
CREATININE: 0.58 mg/dL (ref 0.44–1.00)
Chloride: 109 mmol/L (ref 101–111)
GFR calc Af Amer: >60 mL/min (ref >60)
GFR calc non Af Amer: >60 mL/min (ref >60)
GLUCOSE: 90 mg/dL (ref 65–99)
Potassium: 4 mmol/L (ref 3.5–5.1)
SODIUM: 140 mmol/L (ref 135–145)
TOTAL PROTEIN: 6.8 g/dL (ref 6.5–8.1)

## 2017-06-27 LAB — CBG MONITORING, ED: Glucose-Capillary: 96 mg/dL (ref 65–99)

## 2017-06-27 LAB — TROPONIN I: Troponin I: <0.03 ng/mL (ref <0.03)

## 2017-06-27 LAB — PREGNANCY, URINE: PREG TEST UR: NEGATIVE

## 2017-06-27 MED ORDER — FENTANYL CITRATE (PF) 100 MCG/2ML IJ SOLN
50.0000 ug | Freq: Once | INTRAMUSCULAR | Status: DC
  Filled 2017-06-27: qty 2

## 2017-06-27 MED ORDER — FENTANYL CITRATE (PF) 100 MCG/2ML IJ SOLN
100.0000 ug | Freq: Once | INTRAMUSCULAR | Status: AC
  Administered 2017-06-27: 100 ug via NASAL
  Filled 2017-06-27: qty 2

## 2017-06-27 MED ORDER — ONDANSETRON HCL 4 MG/2ML IJ SOLN: 4.0000 mg | Freq: Once | INTRAMUSCULAR | Status: DC

## 2017-06-27 MED ORDER — PROPOFOL 10 MG/ML IV BOLUS
INTRAVENOUS | Status: DC | PRN
  Administered 2017-06-27: 20 mg via INTRAVENOUS
  Administered 2017-06-27: 10 mg via INTRAVENOUS
  Administered 2017-06-27: 20 mg via INTRAVENOUS

## 2017-06-27 MED ORDER — IBUPROFEN 600 MG PO TABS: 600.0000 mg | ORAL_TABLET | Freq: Four times a day (QID) | ORAL | 0 refills | Status: DC | PRN

## 2017-06-27 MED ORDER — PROPOFOL 10 MG/ML IV BOLUS: 0.5000 mg/kg | Freq: Once | INTRAVENOUS | Status: DC

## 2017-06-27 MED ORDER — PROPOFOL 10 MG/ML IV BOLUS
0.5000 mg/kg | Freq: Once | INTRAVENOUS | Status: AC
  Administered 2017-06-27: 140 mg via INTRAVENOUS
  Filled 2017-06-27: qty 20

## 2017-06-27 MED ORDER — ONDANSETRON 4 MG PO TBDP
4.0000 mg | ORAL_TABLET | Freq: Once | ORAL | Status: AC
  Administered 2017-06-27: 4 mg via ORAL
  Filled 2017-06-27: qty 1

## 2017-06-27 NOTE — ED Triage Notes (Signed)
Pt started new medication Zonisamide 100 mg qhs a  2100 yesterday came to mother at midnight stated she was feeling right , and came to mother again at 80 started she wasn't feeling well having chest tightness.  Mother contacted on cll service for neurology Dr Delice Lesch who recommend she come to ED. Pt had seizure during transport per EMS . Pt suffers from fatique, stress and light sensitive epiliptic seizure. VS Hr=63 resp= 18 BP= 110/65 sat 100% RA CBG= 96

## 2017-06-27 NOTE — ED Triage Notes (Signed)
Dr  Tomi Bamberger in to reduce Left shoulder, able to maneuver shoulder in place, follow up x-ray has been ordered.

## 2017-06-27 NOTE — Discharge Instructions (Signed)
Follow up with Dr. Leanne Chang for recheck of current symptoms, and to discuss your request for port access.  Follow up with Dr. Joline Maxcy for further management of seizure disorder.

## 2017-06-27 NOTE — ED Provider Notes (Signed)
Patient presents after having had a seizure and she reports prior dislocations of her left shoulder. 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  Patient has obvious step off of her left shoulder. She has good distal pulses, she complains of some mild tingling of her fingers.  IV access was not obtainable. Patient wants [place] to proceed with just close reduction. She was given intranasal that no just prior to procedure.  Patient's arm was abducted fully and traction applied and pressure was placed on the humeral head. Her shoulder appeared to be relocated although there is a slight gap posteriorly, consistent with her history of chronic subluxation. Post reduction x-ray was done. I placed her shoulder immobilizer.   Reduction of dislocation Date/Time: 8:21 AM Performed by: [place] by: Janice Norrie Consent: Verbal consent obtained. Risks and benefits: risks, benefits and alternatives were discussed Consent given by: patient Required items: required blood products, implants, devices, and special equipment available Time out: Immediately prior to procedure a "time out" was called to verify the correct patient, procedure, equipment, support staff and site/side marked as required.  Patient given pain medication  Vitals: Vital signs were monitored during sedation. Patient tolerance: Patient tolerated the procedure well with no immediate complications. Joint: left shoulder Reduction technique: abduction and traction  Medical screening examination/treatment/procedure(s) were conducted as a shared visit with non-physician practitioner(s) and myself.  I personally evaluated the patient during the encounter.   EKG Interpretation  Date/Time:  Saturday June 27 2017 04:27:40 EDT Ventricular Rate:  68 PR Interval:    QRS Duration: 92 QT Interval:  414 QTC  Calculation: 441 R Axis:   100 Text Interpretation:  Sinus rhythm Probable left atrial enlargement Borderline right axis deviation RSR' in V1 or V2, probably normal variant No significant change since last tracing 15 Aug 2009 Confirmed by Rolland Porter [phone]) on 06/27/2017 4:33:21 AM Also confirmed by Rolland Porter [phone]), editor Laurena Spies [phone])  on 06/27/2017 7:49:44 AM      Rolland Porter, MD, Barbette Or, MD 06/27/17 [phone]

## 2017-06-27 NOTE — ED Provider Notes (Signed)
[facility] DEPT Provider Note   CSN: [id number] Arrival date & time: 06/27/17  0347     History   Chief Complaint Chief Complaint  Patient presents with  . Altered Mental Status    HPI Gennette Shadix is a 26 y.o. female.  Patient with a history of intractable partial seizures on multiple medications presents with potential reaction to new medication started last night (06/26/17). At around 9:00 pm she took a Zonegran for the first time and at midnight she states she started having symptoms of difficulty breathing, a feeling or aura of impending seizure, "didn't feel right". Symptoms worsened and at 2:00 she decided to come for evaluation. Per EMS the patient had a seizure in route.   The history is provided by the patient. No language interpreter was used.  Altered Mental Status   Associated symptoms include seizures.    Past Medical History:  Diagnosis Date  . Anxiety   . Deafness    progressive  . Fatigue   . Hearing loss   . History of sprained wrist   . Hypoglycemia 06/12/2014  . Migraine headache   . Mononucleosis 2009   no lymphatic symptoms  . Muscle weakness of lower extremity    b/l  . Seizure disorder ([place]) 2010  . Seizures ([place])   . Shoulder subluxation, left     Patient Active Problem List   Diagnosis Date Noted  . Partial idiopathic epilepsy with seizures of localized onset, intractable, without status epilepticus ([place]) 07/12/2015  . Hypoglycemia 06/12/2014  . Migraine, unspecified, without mention of intractable migraine without mention of status migrainosus 06/29/2013  . Asthma 11/27/2011  . Muscle weakness of lower extremity 11/26/2011  . Acute appendicitis with localized peritonitis 11/26/2011  . SEIZURE DISORDER 08/16/2009  . HEARING LOSS, BILATERAL 05/25/2008    Past Surgical History:  Procedure Laterality Date  . LAPAROSCOPIC APPENDECTOMY  11/26/2011   Procedure: APPENDECTOMY LAPAROSCOPIC;  Surgeon: Gayland Curry, MD;  Location: [facility];   Service: General;  Laterality: N/A;  . OVARIAN CYST REMOVAL  2/11   2 removed    OB History    No data available       Home Medications    Prior to Admission medications   Medication Sig Start Date End Date Taking? Authorizing Provider  ALPRAZolam (XANAX) 0.25 MG tablet Take 1 tablet (0.25 mg total) by mouth at bedtime as needed for sleep. 06/12/14   Dohmeier, Asencion Partridge, MD  CO ENZYME Q-10 PO Take by mouth 2 (two) times daily.    [provider]  lacosamide (VIMPAT) 50 MG TABS tablet Take 1 tablet (50 mg total) by mouth 2 (two) times daily. 06/26/17   Cameron Sprang, MD  lamoTRIgine (LAMICTAL) 200 MG tablet Take 1 tablet (200 mg total) by mouth 2 (two) times daily. 06/26/17   Cameron Sprang, MD  levETIRAcetam (KEPPRA) 500 MG tablet Take 1 tablet (500 mg total) by mouth 3 (three) times daily. Take two tablets in the morning and the evening and one tablet at lunchtime. 06/26/17   Cameron Sprang, MD  LORazepam (ATIVAN) 0.5 MG tablet Take 1 tablet (0.5 mg total) by mouth as needed for anxiety. 05/21/16   Dohmeier, Asencion Partridge, MD  LORazepam (ATIVAN) 2 MG/ML injection INJECT 1 ML INTO THE MUSCLE ONCE 01/28/17   Dohmeier, Asencion Partridge, MD  zonisamide (ZONEGRAN) 100 MG capsule Take 1 capsule at night for 2 weeks, then increase to 2 capsules at night for 2 weeks, then increase to 3 capsules at  night 06/26/17   Cameron Sprang, MD    Family History Family History  Problem Relation Age of Onset  . Polymyalgia rheumatica Mother   . Heart disease Maternal Grandmother   . Cancer Maternal Grandmother   . Heart disease Maternal Grandfather   . Stroke Maternal Grandfather   . Diabetes Maternal Grandfather   . COPD Maternal Grandfather   . Kidney failure Maternal Grandfather   . Heart disease Paternal Grandmother   . Heart disease Paternal Grandfather   . Migraines Paternal Grandfather     Social History Social History  Substance Use Topics  . Smoking status: Never Smoker  . Smokeless tobacco:  Never Used  . Alcohol use No     Allergies   Shellfish allergy; Cefuroxime axetil; and Midazolam   Review of Systems Review of Systems  Constitutional: Negative for chills and fever.  HENT: Negative.   Respiratory: Positive for chest tightness and shortness of breath.   Cardiovascular: Negative.   Gastrointestinal: Negative.  Negative for nausea.  Musculoskeletal: Negative.   Skin: Negative.   Neurological: Positive for seizures.     Physical Exam Updated Vital Signs Ht 5\' 5"  (1.651 m)   Wt 46.7 kg (103 lb)   BMI 17.14 kg/m   Physical Exam  Constitutional: She is oriented to person, place, and time. She appears well-developed and well-nourished.  HENT:  Head: Normocephalic.  Neck: Normal range of motion. Neck supple.  Cardiovascular: Normal rate, regular rhythm and intact distal pulses.   Pulmonary/Chest: Effort normal and breath sounds normal. She has no wheezes. She has no rales.  Abdominal: Soft. Bowel sounds are normal. There is no tenderness. There is no rebound and no guarding.  Musculoskeletal: Normal range of motion.  Deformity of left shoulder with anterior shift of proximal humerus. ROM limited on side and front lift above shoulder height.   Neurological: She is alert and oriented to person, place, and time.  Left sided weakness including face, arm, leg. Decreased sensation to light touch on left.   Skin: Skin is warm and dry. No rash noted.  Psychiatric: She has a normal mood and affect.     ED Treatments / Results  Labs (all labs ordered are listed, but only abnormal results are displayed) Labs Reviewed  CBC WITH DIFFERENTIAL/PLATELET  COMPREHENSIVE METABOLIC PANEL  TROPONIN I  URINALYSIS, ROUTINE W REFLEX MICROSCOPIC  PREGNANCY, URINE  CBG MONITORING, ED    EKG  EKG Interpretation  Date/Time:  Saturday June 27 2017 04:27:40 EDT Ventricular Rate:  68 PR Interval:    QRS Duration: 92 QT Interval:  414 QTC Calculation: 441 R  Axis:   100 Text Interpretation:  Sinus rhythm Probable left atrial enlargement Borderline right axis deviation RSR' in V1 or V2, probably normal variant No significant change since last tracing 15 Aug 2009 Confirmed by Rolland Porter [phone]) on 06/27/2017 4:33:21 AM       Radiology No results found.  Procedures Procedures (including critical care time)  Medications Ordered in ED Medications  ondansetron (ZOFRAN) injection 4 mg (not administered)     Initial Impression / Assessment and Plan / ED Course  I have reviewed the triage vital signs and the nursing notes.  Pertinent labs & imaging results that were available during my care of the patient were reviewed by me and considered in my medical decision making (see chart for details).     Patient presents with possible adverse reaction to new medication started last night, Zonegran 100 mg. She had  a seizure in route to the ED and has had prolonged left sided weakness and sensory deficits.   She is observed in the ED for a period of hours without recurrent seizure activity. She reports the left sided weakness is chronic but seems worse to her tonight. Discussed with neuro-hospitalist who advises increased neuro deficits over baseline is not unusual for up to 24 hours after seizure. She is felt stable for idshcarge home from neurologic standpoint.  Left shoulder deformity is found to be an anterior dislocation by imaging. Conscious sedation planned to reduce back into place. Patient updated on plan of care.   IV access attempted with multiple failures. Intranasal fentanyl given prior to closed reduction which was performed by Dr. Rolland Porter.   Post-reduction imaging pending.  Post-reduction shows persistent dislocation. Patient care signed out to Dr. Lacinda Axon for further care. Anticipate discharge home after successful reduction.  Final Clinical Impressions(s) / ED Diagnoses   Final diagnoses:  None   1. Seizure 2. Seizure disorder 3.  Left shoulder dislocation  New Prescriptions New Prescriptions   No medications on file     Charlann Lange, Hershal Coria 06/27/17 [id number]    Rolland Porter, MD 06/27/17 [phone]

## 2017-06-27 NOTE — ED Provider Notes (Signed)
Patient is accepted at shift change for management of persistent dislocated shoulder. Patient has history of seizure disorder and is suspected that shoulder became dislocated during seizure activity during the night. At the time of assuming care for the patient, she is alert with clear mental status and stable vital signs. Neurologically intact.  In order to address patient's underlying seizure disorder, I did have her take her own Keppra and Vimpat this morning. Throughout her stay there has not been any seizure activity. Her mental status since assuming care has been clear.  Fortunately ,prior to assuming the patient's care, nursing staff was able to establish a good IV which had been a problem earlier in the evening. With IV established I did plan to proceed with propofol sedation and repeat attempt at shoulder reduction.  I reassessed the patient for mental status, airway and sedation risks. Patient reported allergy to Versed. She reported once she had a grand mal seizure after sedation. She did not report any airway complications. She is otherwise healthy, well-nourished well-developed individual.  Prior to relocation, patient was spontaneously able to use the left upper extremity to move the forearm fairly extensively and touch her face. She did maintain a significant muscle contraction around the scapula and shoulder at any time that I examined it. She was however obviously neurovascularly intact with good radial pulse and a warm dry extremity.  Procedural sedation Performed by: Charlesetta Shanks Consent: Verbal consent obtained. Risks and benefits: risks, benefits and alternatives were discussed Required items: required blood products, implants, devices, and special equipment available Patient identity confirmed: arm band and provided demographic data Time out: Immediately prior to procedure a "time out" was called to verify the correct patient, procedure, equipment, support staff and site/side  marked as required.  Sedation type: moderate (conscious) sedation NPO time confirmed and considedered  Sedatives: PROPOFOL  Physician Time at Bedside: 30  Vitals: Vital signs were monitored during sedation. Cardiac Monitor, pulse oximeter Patient tolerance: Patient tolerated the procedure well with no immediate complications. Comments: Pt with uneventful recovered. Returned to pre-procedural sedation baseline  Reduction of dislocation Date/Time: 12:34 PM Performed by: Charlesetta Shanks Authorized by: Charlesetta Shanks Consent: Verbal consent obtained. Risks and benefits: risks, benefits and alternatives were discussed Consent given by: patient Required items: required blood products, implants, devices, and special equipment available Time out: Immediately prior to procedure a "time out" was called to verify the correct patient, procedure, equipment, support staff and site/side marked as required.  Patient sedated:see above note  Vitals: Vital signs were monitored during sedation. Patient tolerance: Patient tolerated the procedure well with no immediate complications. Joint:left shoulder Reduction technique:traction/counter traction and extension to overhead position    I did use propofol to keep the patient sedated throughout the procedure and x-rays. Once the patient was adequately sedated with propofol, I was able to easily perform range of motion of the shoulder without any resistance and good rotation. At no point did I actually appreciate the shoulder actually popping back into the joint. Thus I suspect more of a subluxing and muscle spasm situation. Anytime the patient started to come out of sedation she would begin grimacing and engage all the musculature of the anterior shoulder and posterior shoulder drawing it downwards. Thus I maintained propofol sedation throughout and confirmed normal range of motion at all times with x-rays and after repositioning. Once all x-rays were  obtained that confirmed relocation which also was confirmed simply by the ease of range of motion with the patient under sedation,  she was placed in sling and swath.  Patient was observed through complete resolution of sedation and never developed any seizure activity. She was advised shoulder had been relocated and subsequently endorsed resolution of pain. It appears chronic subluxation and laxity is probably a significant contributing factor to the patient's overall shoulder dysfunction.   Charlesetta Shanks, MD 06/27/17 1236

## 2017-06-27 NOTE — ED Notes (Signed)
Bed: BI37 Expected date:  Expected time:  Means of arrival:  Comments: 26 yo F  Altered level of conciousness

## 2017-06-29 ENCOUNTER — Telehealth: Payer: Self-pay

## 2017-06-29 ENCOUNTER — Encounter: Payer: Self-pay | Admitting: Neurology

## 2017-06-29 DIAGNOSIS — E8842 MERRF syndrome: Secondary | ICD-10-CM | POA: Insufficient documentation

## 2017-06-29 DIAGNOSIS — G40309 Generalized idiopathic epilepsy and epileptic syndromes, not intractable, without status epilepticus: Secondary | ICD-10-CM

## 2017-06-29 NOTE — Telephone Encounter (Signed)
Received after/weekend hours message that pt's mother called in (July 14 @ 2:32AM) stating that pt was having difficulty breathing and chest tightness.  Pt's mother advised by triage RN to call EMS/911 immediately.  Pt taken to ED (Please see notes)

## 2017-06-30 ENCOUNTER — Telehealth: Payer: Self-pay

## 2017-06-30 NOTE — Telephone Encounter (Signed)
Pt called.  I advised her to continue taking her Zonisamide per Dr. Delice Lesch.  Pt states that she is very hesitant to continue this medication.  She states that she has had more seizures than usual, but she has not taken the Zonisamide since Friday.  Please advise.

## 2017-06-30 NOTE — Telephone Encounter (Signed)
Made her very drowsy where her speech was slurred, before the seizure started. She was not coherent. She also had a lot of chest tightness. Felt different from when she would have seizures in the past. Told she needed a port because could not get a needle in to give conscious sedation for the shoulder. She will speak to a PCP about this.   She is very hesitant to start the zonisamide, we discussed doing a different medication. It appears she is very sensitive to medications, start Fycompa 2mg  1/2 tab daily for 2 weeks, then increase to 4mg  1/2 tab daily until her f/u with me. Side effects were discussed.

## 2017-07-01 NOTE — Telephone Encounter (Signed)
Sample medications left at front desk for pt pick up

## 2017-07-01 NOTE — Progress Notes (Signed)
I agree with the assessment and plan -The patient is known to me .   Herta Hink, MD  

## 2017-07-02 ENCOUNTER — Encounter: Payer: Self-pay | Admitting: Adult Health

## 2017-07-02 ENCOUNTER — Ambulatory Visit (INDEPENDENT_AMBULATORY_CARE_PROVIDER_SITE_OTHER): Payer: PRIVATE HEALTH INSURANCE | Admitting: Adult Health

## 2017-07-02 VITALS — BP 102/68 | HR 94 | Temp 98.6°F

## 2017-07-02 DIAGNOSIS — G40909 Epilepsy, unspecified, not intractable, without status epilepticus: Secondary | ICD-10-CM | POA: Diagnosis not present

## 2017-07-02 DIAGNOSIS — Z7689 Persons encountering health services in other specified circumstances: Secondary | ICD-10-CM | POA: Diagnosis not present

## 2017-07-02 NOTE — Progress Notes (Signed)
Patient presents to clinic today to establish care. She is a pleasant 26 year old female who  has a past medical history of Anxiety; Deafness; Fatigue; Hearing loss; History of sprained wrist; Hypoglycemia (06/12/2014); Migraine headache; Mononucleosis (2009); Muscle weakness of lower extremity; Seizure disorder ([place]) (2010); Seizures ([place]); and Shoulder subluxation, left.   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 She reports that it took nursing staff 17 tries to get an IV in her and they finally had to use ultrasound. Due to her complex seizure history a ER doctor recommend that she get a port due to poor access and hx of uncontrolled seizures. She is interested in doing this.   Chronic Issues: Seizures - Uncontrolled. She is seen by Neurology. Takes Vimpat, Lamictal, Fycompa,and Keppra. Hx of MERFF Syndrome.   Hearing loss - Diagnosed at age 3   Health Maintenance: Dental -- Routine  Vision -- Routine  Immunizations -- UTD  PAP -- Does not want  Diet: Eats a heart healthy diet  Exercise: Does aerobic activity   Is followed by   Neurology     Past Medical History:  Diagnosis Date  . Anxiety   . Deafness    progressive  . Fatigue   . Hearing loss   . History of sprained wrist   . Hypoglycemia 06/12/2014  . Migraine headache   . Mononucleosis 2009   no lymphatic symptoms  . Muscle weakness of lower extremity    b/l  . Seizure disorder ([place]) 2010  . Seizures ([place])   . Shoulder subluxation, left     Past Surgical History:  Procedure Laterality Date  . LAPAROSCOPIC APPENDECTOMY  11/26/2011   Procedure: APPENDECTOMY LAPAROSCOPIC;  Surgeon: Gayland Curry, MD;  Location: [facility];  Service: General;  Laterality: N/A;  . OVARIAN CYST REMOVAL  2/11   2 removed    Current Outpatient Prescriptions on File Prior to Visit  Medication Sig Dispense Refill  .  ALPRAZolam (XANAX) 0.25 MG tablet Take 1 tablet (0.25 mg total) by mouth at bedtime as needed for sleep. 30 tablet 0  . CO ENZYME Q-10 PO Take 1 tablet by mouth 2 (two) times daily.     Marland Kitchen ibuprofen (ADVIL,MOTRIN) 600 MG tablet Take 1 tablet (600 mg total) by mouth every 6 (six) hours as needed. 30 tablet 0  . lacosamide (VIMPAT) 50 MG TABS tablet Take 1 tablet (50 mg total) by mouth 2 (two) times daily. 180 tablet 3  . lamoTRIgine (LAMICTAL) 200 MG tablet Take 1 tablet (200 mg total) by mouth 2 (two) times daily. 60 tablet 3  . levETIRAcetam (KEPPRA) 500 MG tablet Take 1 tablet (500 mg total) by mouth 3 (three) times daily. Take two tablets in the morning and the evening and one tablet at lunchtime. 120 tablet 6  . LORazepam (ATIVAN) 0.5 MG tablet Take 1 tablet (0.5 mg total) by mouth as needed for anxiety. 30 tablet 1  . LORazepam (ATIVAN) 2 MG/ML injection INJECT 1 ML INTO THE MUSCLE ONCE AS DIRECTED 10 mL 0  . zonisamide (ZONEGRAN) 100 MG capsule Take 1 capsule at night for 2 weeks, then increase to 2 capsules at night for 2 weeks, then increase to 3 capsules at night (Patient not taking: Reported on 07/02/2017) 90 capsule 6   No current facility-administered medications on file prior to visit.  Allergies  Allergen Reactions  . Shellfish Allergy   . Cefuroxime Axetil     REACTION: rash, throat swells shut  . Midazolam Nausea And Vomiting    Family History  Problem Relation Age of Onset  . Polymyalgia rheumatica Mother   . Heart disease Maternal Grandmother   . Cancer Maternal Grandmother   . Heart disease Maternal Grandfather   . Stroke Maternal Grandfather   . Diabetes Maternal Grandfather   . COPD Maternal Grandfather   . Kidney failure Maternal Grandfather   . Heart disease Paternal Grandmother   . Heart disease Paternal Grandfather   . Migraines Paternal Grandfather     Social History   Social History  . Marital status: Single    Spouse name: N/A  . Number of  children: 0  . Years of education: college   Occupational History  . Not on file.   Social History Main Topics  . Smoking status: Never Smoker  . Smokeless tobacco: Never Used  . Alcohol use No  . Drug use: No  . Sexual activity: Not on file   Other Topics Concern  . Not on file   Social History Narrative   Patient is single and lives at home with her parents when not in school.   Patient is currently attending Graduate school.   Patient right-handed.   Patient does not drink any caffeine.    Review of Systems  Constitutional: Negative.   Eyes: Negative.   Respiratory: Negative.   Cardiovascular: Negative.   Gastrointestinal: Negative.   Genitourinary: Negative.   Musculoskeletal: Negative.   Skin: Negative.   Neurological: Negative.   Psychiatric/Behavioral: Negative.   All other systems reviewed and are negative.   BP 102/68 (BP Location: Left Arm, Patient Position: Sitting, Cuff Size: Normal)   Pulse 94   Temp 98.6 F (37 C) (Oral)   SpO2 96%   Physical Exam  Constitutional: She is oriented to person, place, and time and well-developed, well-nourished, and in no distress. No distress.  HENT:  Head: Normocephalic and atraumatic.  Right Ear: External ear normal.  Left Ear: External ear normal.  Nose: Nose normal.  Mouth/Throat: Oropharynx is clear and moist. No oropharyngeal exudate.  Eyes: Pupils are equal, round, and reactive to light. Conjunctivae and EOM are normal. Right eye exhibits no discharge. Left eye exhibits no discharge.  Neck: Normal range of motion. Neck supple. No JVD present. No tracheal deviation present. No thyromegaly present.  Cardiovascular: Normal rate, regular rhythm, normal heart sounds and intact distal pulses.  Exam reveals no gallop and no friction rub.   No murmur heard. Pulmonary/Chest: Effort normal and breath sounds normal. No stridor. No respiratory distress. She has no wheezes. She has no rales. She exhibits no tenderness.    Abdominal: Soft. Bowel sounds are normal. She exhibits no distension and no mass. There is no tenderness. There is no rebound and no guarding.  Musculoskeletal: She exhibits deformity (left shoulder from multiple dislocations ).  Lymphadenopathy:    She has no cervical adenopathy.  Neurological: She is alert and oriented to person, place, and time. She has normal reflexes. She displays normal reflexes. No cranial nerve deficit. She exhibits normal muscle tone. Gait normal. Coordination normal. GCS score is 15.  Skin: Skin is warm and dry. No rash noted. She is not diaphoretic. No erythema. No pallor.  Psychiatric: Mood, memory, affect and judgment normal.  Nursing note and vitals reviewed.   Recent Results (from the past 2160 hour(s))  Comprehensive  metabolic panel     Status: Abnormal   Collection Time: 06/27/17  5:07 AM  Result Value Ref Range   Sodium 140 135 - 145 mmol/L   Potassium 4.0 3.5 - 5.1 mmol/L   Chloride 109 101 - 111 mmol/L   CO2 24 22 - 32 mmol/L   Glucose, Bld 90 65 - 99 mg/dL   BUN 7 6 - 20 mg/dL   Creatinine, Ser 0.58 0.44 - 1.00 mg/dL   Calcium 9.0 8.9 - 10.3 mg/dL   Total Protein 6.8 6.5 - 8.1 g/dL   Albumin 4.0 3.5 - 5.0 g/dL   AST 23 15 - 41 U/L   ALT 13 (L) 14 - 54 U/L   Alkaline Phosphatase 41 38 - 126 U/L   Total Bilirubin 0.7 0.3 - 1.2 mg/dL   GFR calc non Af Amer >60 >60 mL/min   GFR calc Af Amer >60 >60 mL/min    Comment: (NOTE) The eGFR has been calculated using the CKD EPI equation. This calculation has not been validated in all clinical situations. eGFR's persistently <60 mL/min signify possible Chronic Kidney Disease.    Anion gap 7 5 - 15  Troponin I     Status: None   Collection Time: 06/27/17  5:07 AM  Result Value Ref Range   Troponin I <0.03 <0.03 ng/mL  CBG monitoring, ED     Status: None   Collection Time: 06/27/17  5:41 AM  Result Value Ref Range   Glucose-Capillary 96 65 - 99 mg/dL  Urinalysis, Routine w reflex microscopic      Status: Abnormal   Collection Time: 06/27/17  7:01 AM  Result Value Ref Range   Color, Urine STRAW (A) YELLOW   APPearance CLEAR CLEAR   Specific Gravity, Urine 1.005 1.005 - 1.030   pH 8.0 5.0 - 8.0   Glucose, UA NEGATIVE NEGATIVE mg/dL   Hgb urine dipstick NEGATIVE NEGATIVE   Bilirubin Urine NEGATIVE NEGATIVE   Ketones, ur NEGATIVE NEGATIVE mg/dL   Protein, ur NEGATIVE NEGATIVE mg/dL   Nitrite NEGATIVE NEGATIVE   Leukocytes, UA NEGATIVE NEGATIVE  Pregnancy, urine     Status: None   Collection Time: 06/27/17  7:01 AM  Result Value Ref Range   Preg Test, Ur NEGATIVE NEGATIVE    Comment:        THE SENSITIVITY OF THIS METHODOLOGY IS >20 mIU/mL.     Assessment/Plan:  1. Encounter to establish care - Follow up for CPE  - She will need a PCP in [place]  - Follow up for any acute issues  - Continue to exercise and eat healthy   2. Seizure disorder [facility]) - Ambulatory referral to General Surgery- to discuss port access - Continue with follow up appointments per Neurology    Dorothyann Peng, NP

## 2017-07-08 ENCOUNTER — Telehealth: Payer: Self-pay | Admitting: Emergency Medicine

## 2017-07-08 ENCOUNTER — Other Ambulatory Visit: Payer: Self-pay | Admitting: Adult Health

## 2017-07-08 DIAGNOSIS — G40909 Epilepsy, unspecified, not intractable, without status epilepticus: Secondary | ICD-10-CM

## 2017-07-08 NOTE — Telephone Encounter (Signed)
Spoke with Butch Penny from [facility] and they state that they do not treat for port placement for uncontrolled seizures. Referral has to placed else where. Provider is aware.

## 2017-07-09 ENCOUNTER — Telehealth: Payer: Self-pay | Admitting: Neurology

## 2017-07-09 NOTE — Telephone Encounter (Signed)
Spoke with [facility].  They are unsure if they can insert port.  They will speak with Dr. Lucia Gaskins and call me back with an answer.  Will send referral once I hear back from them.

## 2017-07-09 NOTE — Telephone Encounter (Signed)
Spoke with pt, she states that she is doing well on Fycompa, she is about to start second pack, which will last 2 weeks with cutting tablets in half.  Might need to send Rx prior to her appointment.  Pt stated that August 10th works great for her follow up.    Prior phone note says that [facility] will not insert port, however pt states that they will if referral is sent from our office.  I will contact [facility] for clarification and send referral either to them or interventional radiology.

## 2017-07-09 NOTE — Telephone Encounter (Signed)
Patient needs to talk to someone about a referral and medication. She also canceled her appt on  08-14-17 due to her not being in town that day and wanted to [place] but Delice Lesch is booked out until dec so she wants to know what she is to do

## 2017-07-09 NOTE — Telephone Encounter (Signed)
Returned pt's call.  No answer.  LMOM letting pt know that I rescheduled her follow up with Dr. Delice Lesch for August 10 @1 :30.  Asked that pt return my call

## 2017-07-10 ENCOUNTER — Telehealth: Payer: Self-pay | Admitting: Adult Health

## 2017-07-10 DIAGNOSIS — G40909 Epilepsy, unspecified, not intractable, without status epilepticus: Secondary | ICD-10-CM

## 2017-07-10 NOTE — Telephone Encounter (Signed)
Butch Penny called back from [facility] and advised that Dr. Lucia Gaskins WILL do the port placement.  She apologized for any confusion.  Please do NOT send out a new referral.

## 2017-07-10 NOTE — Telephone Encounter (Signed)
I had previously discussed this with the patient, I am very hesitant about ports, there is more risk for infection or clot. Would not send a referral for this, I told her if she wants to discuss it with her PCP, but I would not send the referral for it. Thanks

## 2017-07-10 NOTE — Telephone Encounter (Signed)
Neuro advised pt needs her referral to be made to Interventional radiology for the port.  Not from neuro and/or a general surgeon.

## 2017-07-10 NOTE — Telephone Encounter (Signed)
Patient made aware.

## 2017-07-14 NOTE — Telephone Encounter (Signed)
Please make a referral to interventional radiology for port placement due to seizures

## 2017-07-14 NOTE — Telephone Encounter (Signed)
Referral placed.  Spoke to the pt and informed her.  She stated [facility] called back to schedule her.  She has an appt for Aug.  Informed her I will cancel the order from [facility].

## 2017-07-21 ENCOUNTER — Ambulatory Visit: Payer: Self-pay | Admitting: Surgery

## 2017-07-24 ENCOUNTER — Ambulatory Visit (INDEPENDENT_AMBULATORY_CARE_PROVIDER_SITE_OTHER): Payer: PRIVATE HEALTH INSURANCE | Admitting: Neurology

## 2017-07-24 ENCOUNTER — Encounter: Payer: Self-pay | Admitting: Neurology

## 2017-07-24 VITALS — BP 112/70 | HR 81 | Ht 64.0 in | Wt 105.0 lb

## 2017-07-24 DIAGNOSIS — G40309 Generalized idiopathic epilepsy and epileptic syndromes, not intractable, without status epilepticus: Secondary | ICD-10-CM

## 2017-07-24 DIAGNOSIS — G40019 Localization-related (focal) (partial) idiopathic epilepsy and epileptic syndromes with seizures of localized onset, intractable, without status epilepticus: Secondary | ICD-10-CM

## 2017-07-24 DIAGNOSIS — E8842 MERRF syndrome: Secondary | ICD-10-CM | POA: Diagnosis not present

## 2017-07-24 MED ORDER — LORAZEPAM 2 MG/ML IJ SOLN: INTRAMUSCULAR | 4 refills | Status: DC

## 2017-07-24 MED ORDER — PERAMPANEL 4 MG PO TABS: ORAL_TABLET | ORAL | 5 refills | Status: DC

## 2017-07-24 NOTE — Patient Instructions (Addendum)
1. After 2 weeks of taking 1/2 tablet of Fycompa 4mg , increase to 1 tablet daily. Prescription has been sent 2. After 2 weeks of taking the full dose of Fycompa, reduce Lamictal 200mg  by taking 1/2 tablet once a day, then call our office 3. Continue all other medications 4. Look into insurance plan if Dr. Arcola Jansky (Internal Medicine, [place]) 4. Follow-up on 11/21 at 4pm  Seizure Precautions: 1. If medication has been prescribed for you to prevent seizures, take it exactly as directed.  Do not stop taking the medicine without talking to your doctor first, even if you have not had a seizure in a long time.   2. Avoid activities in which a seizure would cause danger to yourself or to others.  Don't operate dangerous machinery, swim alone, or climb in high or dangerous places, such as on ladders, roofs, or girders.  Do not drive unless your doctor says you may.  3. If you have any warning that you may have a seizure, lay down in a safe place where you can't hurt yourself.    4.  No driving for 6 months from last seizure, as per [facility].   Please refer to the following link on the [place] website for more information: http://www.epilepsyfoundation.org/answerplace/Social/driving/drivingu.cfm   5.  Maintain good sleep hygiene. Avoid alcohol.  6.  Notify your neurology if you are planning pregnancy or if you become pregnant.  7.  Contact your doctor if you have any problems that may be related to the medicine you are taking.  8.  Call 911 and bring the patient back to the ED if:        A.  The seizure lasts longer than 5 minutes.       B.  The patient doesn't awaken shortly after the seizure  C.  The patient has new problems such as difficulty seeing, speaking or moving  D.  The patient was injured during the seizure  E.  The patient has a temperature over 102 F (39C)  F.  The patient vomited and now is having trouble breathing

## 2017-07-24 NOTE — Progress Notes (Signed)
NEUROLOGY FOLLOW UP OFFICE NOTE  [name] [id number] 1991/11/11  HISTORY OF PRESENT ILLNESS: I had the pleasure of seeing [name] in follow-up in the neurology clinic on 07/24/2017.  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 On her last visit, she was started on Zonisamide but had a reaction where she felt very drowsy with slurred speech, then had seizure. She reports she was not coherent and had a lot of chest tightness, different from her prior seizures. She was told she would need a port because they could not get access to give conscious sedation for her shoulder. She requested to start a different medication and started on 1mg  daily for 2 weeks, now on low dose Fycompa 2mg  daily for the past 1.5 weeks overall tolerating it well except she feels more dizzy. She has started reducing lamotrigine, currently on 200mg  1/2 tab BID. She continues to report frequent seizures, she had a big one 2 weeks ago, the complex partial seizures are still frequent, she had 2 yesterday at [place] where sh felt them coming on. She had a bad migraine yesterday, which sometimes precedes a seizure. She reports having a 5-day EMU stay at the [facility], as well as muscle biopsy on her leg confirming MERRF. She reports being told during EMU stay that she was having subclinical seizures as well, maybe from the frontal region, she is unsure. She reports being told her MRI brain had shown a spot of scar tissue that was not amenable to surgery, possibly the seizure focus.   She was in the ER last month after taking Zonisamide and having a seizure in her sleep, waking up with another left shoulder dislocation. Xray showed anterior dislocation of the left shoulder, she underwent conscious sedation with Propofol with note that actual popping back into joint was not appreciated, it was suspected to be more of a subluxing and muscle spasm situation. Repeat xray showed interval reduction of previously  dislocated shoulder. She has seen interventional radiology with plans for port placement next month. She goes back to [place] for school next month.  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 She reports that she had a lot of instances of "spacing out" as a teenager, but was not diagnosed with seizures until she had her first generalized tonic-clonic seizure at age 26. She initially did not recognize any warning symptoms, but now feels lightheaded/dizzy "like a white noise," both arms are numb and tingly, then loses consciousness. Witnesses have told her she about complex partial seizures where her eyes are open, she would vocalize, smack her lips, and fidget. After a seizure, her left side feels weaker. She reports that GTCs mostly occur when she is sick or sleep deprived, last GTC was in June. She usually has a couple of complex partial seizures before having a convulsion. One time she had 4 GTCs in a week. She has dislocated her shoulder "hundreds of times" since age 6, particularly the left shoulder. She has frequent focal seizures occurring at least once a week for the past 1.5-2 years. She reports they cluster for several days in a row, sometimes a couple of times a day. She had one  yesterday and the day prior. Previous to this was a week ago. She has been travelling recently and has more seizures when stressed or sleep deprived. She has noticed that sometimes one of her limbs would jerk a little bit. She would have occasional brief twitching in her arms like a tremor with no associated aura or convulsion, more in the evening hours. She has nocturnal seizures where she wakes up with  urinary incontinence every 2 weeks or so. She had tried Tegretol in the past, which was ineffective. She is currently on Lamotrigine 200mg  BID, Keppra 1000, 1000mg , 500mg , and Vimpat 50mg  BID. She has been taking Lamotrigine since age 11, no side effects. She feels different if she takes it an hour late. She has been on Keppra since age 88, and Vimpat was started 1.5 years ago. She feels Vimpat has been the most helpful by far, with less severe convulsions. She gets a little dizzy and nauseated after taking Vimpat. She had a VNS placed for 3.5 weeks, then had it taken out because there were no resources for VNS in [place], [place]. She was told she had to go to [place] 2 hours away to interrogate the device, and special permission was needed for people in school to help swipe her magnet.  She occasionally has gustatory hallucinations of tasting cheese. She gets a lot of nausea but they are not clearly related to the seizures. For the past couple of years, she has headaches a couple of times a week, usually over the frontal and temporal regions where she gets dizzy, vision "gets funky," with nausea and light sensitivity (sometimes triggering headaches).  Naproxen helps. Pain is intense for about 20-30 minutes, then hurts for hours. She has neck and back pain. No clear relation to menstrual cycle, her periods are very irregular, she reports amenorrhea for 4 years at one point and has not had this checked. She constantly feels her left side has less sensation, she has difficulty typing with her left hand. 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 She has some accommodations where lessons are transcribed for her. She has been having a lot of difficulties with her professors in school. She has a seizure dog who can detect the GTCs but only detect the complex partial seizure "50% of the time." She is concerned that she is getting  sick all the time, she is sick at least once a month "I catch everything," and has started to have epistaxis.   Epilepsy Risk Factors:  MERRF. She reports her sister was tested and is a carrier. Otherwise she had a normal birth and early development.  There is no history of febrile convulsions, CNS infections such as meningitis/encephalitis, significant traumatic brain injury, neurosurgical procedures, or family history of seizures.  Prior AEDs: Tegretol  EEGs: 08/17/2009 at [facility] was a normal wake EEG 09/09/2009 24-hour EEG at [facility] was normal. Pushbutton events not associated with EEG change MRI: MRI brain without contrast done 04/17/2011 reported as normal, images unavailable for review  PAST MEDICAL HISTORY: Past Medical History:  Diagnosis Date  . Anxiety   . Deafness    progressive  . Fatigue   . Hearing loss   . History of sprained wrist   . Hypoglycemia 06/12/2014  . Migraine headache   . Mononucleosis 2009   no lymphatic symptoms  . Muscle weakness of lower extremity    b/l  .  Seizure disorder ([place]) 2010  . Seizures ([place])   . Shoulder subluxation, left     MEDICATIONS: Current Outpatient Prescriptions on File Prior to Visit  Medication Sig Dispense Refill  . ALPRAZolam (XANAX) 0.25 MG tablet Take 1 tablet (0.25 mg total) by mouth at bedtime as needed for sleep. 30 tablet 0  . CO ENZYME Q-10 PO Take 1 tablet by mouth 2 (two) times daily.     Marland Kitchen ibuprofen (ADVIL,MOTRIN) 600 MG tablet Take 1 tablet (600 mg total) by mouth every 6 (six) hours as needed. 30 tablet 0  . lacosamide (VIMPAT) 50 MG TABS tablet Take 1 tablet (50 mg total) by mouth 2 (two) times daily. 180 tablet 3  . lamoTRIgine (LAMICTAL) 200 MG tablet Take 1 tablet (200 mg total) by mouth 2 (two) times daily. 60 tablet 3  . levETIRAcetam (KEPPRA) 500 MG tablet Take 1 tablet (500 mg total) by mouth 3 (three) times daily. Take two tablets in the morning and the evening and one tablet at lunchtime. 120 tablet 6  . LORazepam  (ATIVAN) 0.5 MG tablet Take 1 tablet (0.5 mg total) by mouth as needed for anxiety. 30 tablet 1  . LORazepam (ATIVAN) 2 MG/ML injection INJECT 1 ML INTO THE MUSCLE ONCE AS DIRECTED 10 mL 0  . zonisamide (ZONEGRAN) 100 MG capsule Take 1 capsule at night for 2 weeks, then increase to 2 capsules at night for 2 weeks, then increase to 3 capsules at night (Patient not taking: Reported on 07/02/2017) 90 capsule 6   No current facility-administered medications on file prior to visit.     ALLERGIES: Allergies  Allergen Reactions  . Shellfish Allergy   . Cefuroxime Axetil     REACTION: rash, throat swells shut  . Midazolam Nausea And Vomiting    FAMILY HISTORY: Family History  Problem Relation Age of Onset  . Polymyalgia rheumatica Mother   . Heart disease Mother   . Sudden Cardiac Death Father        long QT syndrome   . Heart disease Maternal Grandmother   . Breast cancer Maternal Grandmother   . Heart disease Maternal Grandfather   . Stroke Maternal Grandfather   . Diabetes Maternal Grandfather   . COPD Maternal Grandfather   . Kidney failure Maternal Grandfather   . Heart disease Paternal Grandmother   . Heart disease Paternal Grandfather   . Migraines Paternal Grandfather     SOCIAL HISTORY: Social History   Social History  . Marital status: Single    Spouse name: N/A  . Number of children: 0  . Years of education: college   Occupational History  . Not on file.   Social History Main Topics  . Smoking status: Never Smoker  . Smokeless tobacco: Never Used  . Alcohol use No  . Drug use: No  . Sexual activity: Not on file   Other Topics Concern  . Not on file   Social History Narrative   Patient is single and lives at home with her parents when not in school.   Patient is currently attending Graduate school.   Patient right-handed.   Patient does not drink any caffeine.    REVIEW OF SYSTEMS: Constitutional: No fevers, chills, or sweats, no generalized fatigue,  change in appetite Eyes: No visual changes, double vision, eye pain Ear, nose and throat: No hearing loss, ear pain, nasal congestion, sore throat Cardiovascular: No chest pain, palpitations Respiratory:  No shortness of breath at rest or with exertion, wheezes  GastrointestinaI: No nausea, vomiting, diarrhea, abdominal pain, fecal incontinence Genitourinary:  No dysuria, urinary retention or frequency Musculoskeletal:  No neck pain, back pain Integumentary: No rash, pruritus, skin lesions Neurological: as above Psychiatric: No depression, insomnia, anxiety Endocrine: No palpitations, fatigue, diaphoresis, mood swings, change in appetite, change in weight, increased thirst Hematologic/Lymphatic:  No anemia, purpura, petechiae. Allergic/Immunologic: no itchy/runny eyes, nasal congestion, recent allergic reactions, rashes  PHYSICAL EXAM: Vitals:   07/24/17 1334  BP: 112/70  Pulse: 81  SpO2: 98%   General: No acute distress Head:  Normocephalic/atraumatic Neck: supple, no paraspinal tenderness, full range of motion Heart:  Regular rate and rhythm Lungs:  Clear to auscultation bilaterally Back: No paraspinal tenderness Skin/Extremities: No rash, no edema Neurological Exam: alert and oriented to person, place, and time. No aphasia or dysarthria. Fund of knowledge is appropriate.  Recent and remote memory are intact.  Attention and concentration are normal.    Able to name objects and repeat phrases. Cranial nerves: Pupils equal, round. No facial asymmetry. Motor: moves all extremities symmetrically. Gait narrow-based and steady.  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 She has had progressive hearing loss since age 59, myoclonic jerks, as well as focal seizures with impaired awareness occurring  1-2 times a week and GTCs, last GTC was 2 weeks ago, last CPS yesterday. She had a bad reaction after taking one dose of Zonisamide. She was started on Fycompa 3 weeks ago, currently on 2mg  daily. Increase to 4mg  daily as planned. She has started Lamotrigine taper, currently on 200mg  1/2 tablet BID. She will reduce to 1/2 tablet daily, then call our office for further tapering instructions. She continues on Keppra 1000-500-500 and Vimpat 50mg  BID. We may increase Vimpat in the future as well as she feels this has been helpful. She has been prescribed IM Ativan in the past which she uses rarely, refills sent. She does not drive. No pregnancy plans at this time. She will call our office prior to leaving for [place] for update, then follow-up when she returns for vacation in November.   Thank you for allowing me to participate in her care.  Please do not hesitate to call for any questions or concerns.  The duration of this appointment visit was 25 minutes of face-to-face time with the patient.  Greater than 50% of this time was spent in counseling, explanation of diagnosis, planning of further management, and coordination of care.   Ellouise Newer, M.D.   CC: Dorothyann Peng, NP

## 2017-08-03 ENCOUNTER — Telehealth: Payer: Self-pay | Admitting: Neurology

## 2017-08-03 NOTE — Telephone Encounter (Signed)
Caryl Pina from [facility] left a voicemail message saying they needed additional notes on PT to consider referral that was faxed to them  CB# [phone]

## 2017-08-04 NOTE — Telephone Encounter (Signed)
Spoke with Caryl Pina.  She states that she needs paperwork/offie visit notes on pt stating her MERFF dx.  I let Caryl Pina know that that information was provided by the pt, and that we had no records of it.  Her biopsy was done at [facility]

## 2017-08-14 ENCOUNTER — Ambulatory Visit: Payer: PRIVATE HEALTH INSURANCE | Admitting: Neurology

## 2017-08-19 ENCOUNTER — Other Ambulatory Visit: Payer: Self-pay | Admitting: Orthopedic Surgery

## 2017-08-19 DIAGNOSIS — M25312 Other instability, left shoulder: Secondary | ICD-10-CM

## 2017-08-19 DIAGNOSIS — M25512 Pain in left shoulder: Secondary | ICD-10-CM

## 2017-08-24 ENCOUNTER — Encounter (HOSPITAL_BASED_OUTPATIENT_CLINIC_OR_DEPARTMENT_OTHER): Payer: Self-pay | Admitting: *Deleted

## 2017-08-24 NOTE — Progress Notes (Signed)
Ensure pre surgery drink given with instructions to complete by 0645 dos, pt verbalized understanding. 

## 2017-08-24 NOTE — Pre-Procedure Instructions (Signed)
Hx. of seizures discussed with Dr. Marcie Bal; pt. OK to come for surgery.  Pt. instructed to come pick up Ensure pre-surgery drink 10 oz. to drink by 0645 DOS.

## 2017-08-27 NOTE — H&P (Signed)
[name]  Location: [place] Office Patient #: [id number] DOB: 12/10/1991 Single / Language: Cleophus Molt / Race: White Female  History of Present Illness   The patient is a 26 year old female who presents with a complaint of need of iv access.  The PCP is Dr. Talbert Forest  The patient was referred by Dr. Ala Bent.  She comes with her dog, Theresia Lo (6 years)  The patient has MERRF syndrome with repeated seizures. She often needs IV access to control her seizures. One time she says she was stuck as many as 19 times to find a vein. She was seen Dr. Brett Fairy for neurology, but has switched to Dr. Ellouise Newer. She is currently getting some medication adjusted to try to better control her seizures. She is currently in school in [place] getting a PhD in [place]. She got her undergraduate degree at [employer] and her masters in [place].  With her seizures, she's had multiple dislocations of her shoulder. She has active dislocation of her left shoulder right now. She has seen Dr. Domingo Cocking at [facility] and Dr. Lynann Bologna in [place]. Apparently because her seizures are so poorly controlled, Dr. Domingo Cocking has been hesitant to operate on her left shoulder.  She takes oral medication for her seizures, who requires IV Ativan when she has complex partial seizures, in hopes of not going into full seizures.  I discussed the indications and potential complications of the power port placement. The primary complications of the power port, include, but are not limited to, bleeding, infection, nerve injury, thrombosis, and pneumothorax. A unique risk for her would be this will be a long-term catheter, because I explained that most our catheters were placed in for chemotherapy, but she has a good handle on this. She has an arrangement for power port flushes. [place] Scrape is a note from Dr. Delice Lesch that she is not really in favor of a port (07/10/2017)]  Past  Medical History: 1. Seizures 2. MERRF - diagnosed on muscle biopsy at [facility] - 2012 3. Chronic dislocations 4. Lap appendectomy - [place] - 2012 5. Hearing loss  Social History: Unmarried. Has dog, Theresia Lo (for 6 years) Lives with Mom on [address]. Does not drive. Lives with roommates in [place]. 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  Note, I took care of her grandmother, Domenick Gong.   Problem List/Past Medical Mammie Lorenzo, LPN; 06/21/4127 7:86 PM) Anxiety  Asthma  Heart murmur  Chest pain  Migraines  Seizures   Past Surgical History Mammie Lorenzo, LPN; 06/19/7208 4:70 PM) Appendectomy   Allergies Mammie Lorenzo, LPN; 08/21/2835 6:29 PM) Shellfish  Anaphylaxis. Cefuroxime Axetil *CEPHALOSPORINS*  Midazolam HCl *HYPNOTICS/SEDATIVES/SLEEP DISORDER AGENTS*  Nausea, Vomiting. Versed *HYPNOTICS/SEDATIVES/SLEEP DISORDER AGENTS*  Allergies Reconciled   Medication History Mammie Lorenzo, LPN; 03/21/6545 5:03 PM) Aczone (7.5% Gel, External) Active. Co Q 10 (10MG  Capsule, Oral) Active. Ibuprofen (200MG  Capsule, Oral) Active. Vimpat (50MG  Tablet, Oral) Active. LaMICtal (200MG  Tablet, Oral) Active. Keppra (500MG  Tablet, Oral) Active. Ativan (2MG /ML Solution, Injection) Active. Ativan (0.5MG  Tablet, Oral) Active.  Social History Mammie Lorenzo, LPN; 04/18/6567 1:27 PM) Current tobacco use  Never smoker. No alcohol use  No caffeine use  No drug use   Family History Mammie Lorenzo, LPN; 04/14/7000 7:49 PM) Heart Disease  Father. High Blood Pressure / Hypertension  Father.  Pregnancy / Birth History Mammie Lorenzo, LPN; 03/18/9674 9:16 PM) Age at menarche  33 years. Pregnancies ([place])  0. Deliveries ([place])  0. Pap smear  1-5  years ago. Mammogram  Never.    Review of Systems Claiborne Billings [facility] LPN; 03/17/8767 1:15 PM) HEENT Present- Decreased Hearing and Wears glasses/contact lenses. Musculoskeletal Present- Joint Pain,  Muscle Pain and Muscle Weakness. Neurological Present- Headaches, Numbness, Seizures, Tingling and Tremor. Psychiatric Present- Anxiety.  Vitals Claiborne Billings Dockery LPN; 06/15/6202 5:59 PM) 07/21/2017 2:26 PM Weight: 105 lb Height: 64in Weight was reported by patient. Body Surface Area: 1.49 m Body Mass Index: 18.02 kg/m  Temp.: 98.75F(Oral)  BP: 92/58 (Sitting, Left Arm, Standard)   Physical Exam  General: Thin WF alert and generally healthy appearing. She speaks with a lisp. HEENT: Normal. Pupils equal.  Neck: Supple. No mass. No thyroid mass. Lymph Nodes: No supraclavicular or cervical nodes.  Lungs: Clear to auscultation and symmetric breath sounds. Heart: RRR. No murmur or rub.  Abdomen: Soft. No mass. No tenderness. No hernia. Normal bowel sounds. No abdominal scars. Rectal: Not done.  Extremities: Good strength and ROM in upper and lower extremities.  Neurologic: Grossly intact to motor and sensory function. Psychiatric: Has normal mood and affect. Behavior is normal.  Assessment & Plan  1.  DIFFICULT INTRAVENOUS ACCESS (Z78.9)  Plan:  1) Power port for IV access  2.  Seizures 3.  MERRF (MYOCLONUS EPILEPSY AND RAGGED RED FIBERS) (E88.42) 4. Chronic dislocations 5. Hearing loss   Alphonsa Overall, MD, [facility] Pager: [phone] Office phone:  [phone]

## 2017-08-28 ENCOUNTER — Ambulatory Visit (HOSPITAL_COMMUNITY): Payer: PRIVATE HEALTH INSURANCE

## 2017-08-28 ENCOUNTER — Ambulatory Visit (HOSPITAL_BASED_OUTPATIENT_CLINIC_OR_DEPARTMENT_OTHER): Payer: PRIVATE HEALTH INSURANCE | Admitting: Anesthesiology

## 2017-08-28 ENCOUNTER — Encounter (HOSPITAL_BASED_OUTPATIENT_CLINIC_OR_DEPARTMENT_OTHER)
Admission: RE | Disposition: A | Discharge status: Home / Self Care | Payer: Self-pay | Source: Ambulatory Visit | Attending: Surgery

## 2017-08-28 ENCOUNTER — Encounter (HOSPITAL_BASED_OUTPATIENT_CLINIC_OR_DEPARTMENT_OTHER): Payer: Self-pay | Admitting: *Deleted

## 2017-08-28 ENCOUNTER — Ambulatory Visit (HOSPITAL_BASED_OUTPATIENT_CLINIC_OR_DEPARTMENT_OTHER)
Admission: RE | Admit: 2017-08-28 | Discharge: 2017-08-28 | Disposition: A | Discharge status: Home / Self Care | Payer: PRIVATE HEALTH INSURANCE | Source: Ambulatory Visit | Attending: Surgery | Admitting: Surgery

## 2017-08-28 DIAGNOSIS — G40309 Generalized idiopathic epilepsy and epileptic syndromes, not intractable, without status epilepticus: Secondary | ICD-10-CM | POA: Diagnosis not present

## 2017-08-28 DIAGNOSIS — Z79899 Other long term (current) drug therapy: Secondary | ICD-10-CM | POA: Insufficient documentation

## 2017-08-28 DIAGNOSIS — F419 Anxiety disorder, unspecified: Secondary | ICD-10-CM | POA: Insufficient documentation

## 2017-08-28 DIAGNOSIS — G40909 Epilepsy, unspecified, not intractable, without status epilepticus: Secondary | ICD-10-CM | POA: Diagnosis present

## 2017-08-28 DIAGNOSIS — G40409 Other generalized epilepsy and epileptic syndromes, not intractable, without status epilepticus: Secondary | ICD-10-CM

## 2017-08-28 DIAGNOSIS — E8842 MERRF syndrome: Secondary | ICD-10-CM | POA: Diagnosis not present

## 2017-08-28 DIAGNOSIS — J45909 Unspecified asthma, uncomplicated: Secondary | ICD-10-CM | POA: Diagnosis not present

## 2017-08-28 DIAGNOSIS — Z95828 Presence of other vascular implants and grafts: Secondary | ICD-10-CM

## 2017-08-28 HISTORY — DX: Presence of external hearing-aid: Z97.4

## 2017-08-28 HISTORY — DX: Blister (nonthermal) of left wrist, initial encounter: S60.822A

## 2017-08-28 HISTORY — DX: Ataxia, unspecified: R27.0

## 2017-08-28 HISTORY — DX: Personal history of other diseases of the respiratory system: Z87.09

## 2017-08-28 HISTORY — DX: Migraine, unspecified, not intractable, without status migrainosus: G43.909

## 2017-08-28 HISTORY — DX: Unspecified lack of coordination: R27.9

## 2017-08-28 HISTORY — DX: Anesthesia of skin: R20.0

## 2017-08-28 HISTORY — DX: Tremor, unspecified: R25.1

## 2017-08-28 HISTORY — PX: PORTACATH PLACEMENT: SHX2246

## 2017-08-28 HISTORY — DX: Localization-related (focal) (partial) symptomatic epilepsy and epileptic syndromes with complex partial seizures, not intractable, without status epilepticus: G40.209

## 2017-08-28 HISTORY — DX: Cardiac murmur, unspecified: R01.1

## 2017-08-28 HISTORY — DX: Cerebral cysts: G93.0

## 2017-08-28 LAB — POCT PREGNANCY, URINE: Preg Test, Ur: NEGATIVE

## 2017-08-28 SURGERY — INSERTION, TUNNELED CENTRAL VENOUS DEVICE, WITH PORT
Anesthesia: General | Site: Chest | Laterality: Right

## 2017-08-28 MED ORDER — ONDANSETRON HCL 4 MG/2ML IJ SOLN
INTRAMUSCULAR | Status: AC
  Filled 2017-08-28: qty 2

## 2017-08-28 MED ORDER — ACETAMINOPHEN 500 MG PO TABS
ORAL_TABLET | ORAL | Status: AC
  Filled 2017-08-28: qty 2

## 2017-08-28 MED ORDER — BUPIVACAINE-EPINEPHRINE (PF) 0.25% -1:200000 IJ SOLN
INTRAMUSCULAR | Status: AC
  Filled 2017-08-28: qty 30

## 2017-08-28 MED ORDER — CIPROFLOXACIN IN D5W 400 MG/200ML IV SOLN
INTRAVENOUS | Status: AC
  Filled 2017-08-28: qty 200

## 2017-08-28 MED ORDER — FENTANYL CITRATE (PF) 100 MCG/2ML IJ SOLN
INTRAMUSCULAR | Status: AC
  Filled 2017-08-28: qty 2

## 2017-08-28 MED ORDER — HEPARIN SOD (PORK) LOCK FLUSH 100 UNIT/ML IV SOLN
INTRAVENOUS | Status: DC | PRN
  Administered 2017-08-28: 400 [IU]

## 2017-08-28 MED ORDER — HEPARIN (PORCINE) IN NACL 2-0.9 UNIT/ML-% IJ SOLN
INTRAMUSCULAR | Status: AC | PRN
  Administered 2017-08-28: 1 via INTRAVENOUS

## 2017-08-28 MED ORDER — CIPROFLOXACIN IN D5W 400 MG/200ML IV SOLN
400.0000 mg | Freq: Once | INTRAVENOUS | Status: AC
  Administered 2017-08-28: 400 mg via INTRAVENOUS

## 2017-08-28 MED ORDER — GABAPENTIN 300 MG PO CAPS
ORAL_CAPSULE | ORAL | Status: AC
  Filled 2017-08-28: qty 1

## 2017-08-28 MED ORDER — KETOROLAC TROMETHAMINE 30 MG/ML IJ SOLN
INTRAMUSCULAR | Status: AC
  Filled 2017-08-28: qty 1

## 2017-08-28 MED ORDER — PROPOFOL 500 MG/50ML IV EMUL
INTRAVENOUS | Status: AC
  Filled 2017-08-28: qty 50

## 2017-08-28 MED ORDER — MIDAZOLAM HCL 2 MG/2ML IJ SOLN: 1.0000 mg | INTRAMUSCULAR | Status: DC | PRN

## 2017-08-28 MED ORDER — PROPOFOL 10 MG/ML IV BOLUS
INTRAVENOUS | Status: DC | PRN
  Administered 2017-08-28: 200 mg via INTRAVENOUS

## 2017-08-28 MED ORDER — DEXAMETHASONE SODIUM PHOSPHATE 10 MG/ML IJ SOLN
INTRAMUSCULAR | Status: AC
  Filled 2017-08-28: qty 1

## 2017-08-28 MED ORDER — MEPERIDINE HCL 25 MG/ML IJ SOLN: 6.2500 mg | INTRAMUSCULAR | Status: DC | PRN

## 2017-08-28 MED ORDER — LIDOCAINE 2% (20 MG/ML) 5 ML SYRINGE
INTRAMUSCULAR | Status: AC
  Filled 2017-08-28: qty 5

## 2017-08-28 MED ORDER — TRAMADOL HCL 50 MG PO TABS: 50.0000 mg | ORAL_TABLET | Freq: Four times a day (QID) | ORAL | 0 refills | Status: DC | PRN

## 2017-08-28 MED ORDER — DEXAMETHASONE SODIUM PHOSPHATE 4 MG/ML IJ SOLN
INTRAMUSCULAR | Status: DC | PRN
  Administered 2017-08-28: 10 mg via INTRAVENOUS

## 2017-08-28 MED ORDER — LACTATED RINGERS IV SOLN
INTRAVENOUS | Status: DC
  Administered 2017-08-28: 10:00:00 via INTRAVENOUS

## 2017-08-28 MED ORDER — GABAPENTIN 300 MG PO CAPS
300.0000 mg | ORAL_CAPSULE | ORAL | Status: AC
  Administered 2017-08-28: 300 mg via ORAL

## 2017-08-28 MED ORDER — SCOPOLAMINE 1 MG/3DAYS TD PT72: 1.0000 | MEDICATED_PATCH | Freq: Once | TRANSDERMAL | Status: DC | PRN

## 2017-08-28 MED ORDER — LIDOCAINE 2% (20 MG/ML) 5 ML SYRINGE
INTRAMUSCULAR | Status: DC | PRN
  Administered 2017-08-28: 40 mg via INTRAVENOUS

## 2017-08-28 MED ORDER — ACETAMINOPHEN 500 MG PO TABS
1000.0000 mg | ORAL_TABLET | ORAL | Status: AC
  Administered 2017-08-28: 1000 mg via ORAL

## 2017-08-28 MED ORDER — HEPARIN (PORCINE) IN NACL 2-0.9 UNIT/ML-% IJ SOLN
INTRAMUSCULAR | Status: AC
  Filled 2017-08-28: qty 500

## 2017-08-28 MED ORDER — METOCLOPRAMIDE HCL 5 MG/ML IJ SOLN: 10.0000 mg | Freq: Once | INTRAMUSCULAR | Status: DC | PRN

## 2017-08-28 MED ORDER — BUPIVACAINE-EPINEPHRINE 0.25% -1:200000 IJ SOLN
INTRAMUSCULAR | Status: DC | PRN
  Administered 2017-08-28: 6 mL

## 2017-08-28 MED ORDER — FENTANYL CITRATE (PF) 100 MCG/2ML IJ SOLN
50.0000 ug | INTRAMUSCULAR | Status: DC | PRN
  Administered 2017-08-28 (×2): 100 ug via INTRAVENOUS

## 2017-08-28 MED ORDER — FENTANYL CITRATE (PF) 100 MCG/2ML IJ SOLN: 25.0000 ug | INTRAMUSCULAR | Status: DC | PRN

## 2017-08-28 MED ORDER — LACTATED RINGERS IV SOLN
INTRAVENOUS | Status: DC
Start: 2017-08-28 — End: 2017-08-28

## 2017-08-28 MED ORDER — HEPARIN SOD (PORK) LOCK FLUSH 100 UNIT/ML IV SOLN
INTRAVENOUS | Status: AC
  Filled 2017-08-28: qty 5

## 2017-08-28 SURGICAL SUPPLY — 50 items
ADH SKN CLS APL DERMABOND .7 (GAUZE/BANDAGES/DRESSINGS) ×1
APL SKNCLS STERI-STRIP NONHPOA (GAUZE/BANDAGES/DRESSINGS) ×1
BAG DECANTER FOR FLEXI CONT (MISCELLANEOUS) ×2 IMPLANT
BENZOIN TINCTURE PRP APPL 2/3 (GAUZE/BANDAGES/DRESSINGS) ×2 IMPLANT
BLADE SURG 15 STRL LF DISP TIS (BLADE) ×1 IMPLANT
BLADE SURG 15 STRL SS (BLADE) ×2
CHLORAPREP W/TINT 26ML (MISCELLANEOUS) ×2 IMPLANT
CLEANER CAUTERY TIP 5X5 PAD (MISCELLANEOUS) ×1 IMPLANT
COVER BACK TABLE 60X90IN (DRAPES) ×2 IMPLANT
COVER MAYO STAND STRL (DRAPES) ×2 IMPLANT
COVER PROBE 5X48 (MISCELLANEOUS) ×2
DECANTER SPIKE VIAL GLASS SM (MISCELLANEOUS) IMPLANT
DERMABOND ADVANCED (GAUZE/BANDAGES/DRESSINGS) ×1
DERMABOND ADVANCED .7 DNX12 (GAUZE/BANDAGES/DRESSINGS) ×1 IMPLANT
DRAPE C-ARM 42X72 X-RAY (DRAPES) ×2 IMPLANT
DRAPE LAPAROTOMY T 102X78X121 (DRAPES) ×2 IMPLANT
DRAPE UTILITY XL STRL (DRAPES) ×2 IMPLANT
ELECT REM PT RETURN 9FT ADLT (ELECTROSURGICAL) ×2
ELECTRODE REM PT RTRN 9FT ADLT (ELECTROSURGICAL) ×1 IMPLANT
GAUZE SPONGE 4X4 12PLY STRL LF (GAUZE/BANDAGES/DRESSINGS) ×4 IMPLANT
GLOVE BIOGEL PI IND STRL 7.0 (GLOVE) IMPLANT
GLOVE BIOGEL PI INDICATOR 7.0 (GLOVE) ×1
GLOVE SURG SIGNA 7.5 PF LTX (GLOVE) ×2 IMPLANT
GLOVE SURG SS PI 6.5 STRL IVOR (GLOVE) ×1 IMPLANT
GOWN STRL REUS W/ TWL LRG LVL3 (GOWN DISPOSABLE) ×1 IMPLANT
GOWN STRL REUS W/ TWL XL LVL3 (GOWN DISPOSABLE) ×1 IMPLANT
GOWN STRL REUS W/TWL LRG LVL3 (GOWN DISPOSABLE) ×2
GOWN STRL REUS W/TWL XL LVL3 (GOWN DISPOSABLE) ×2
IV CATH AUTO 14GX1.75 SAFE ORG (IV SOLUTION) IMPLANT
IV CATH PLACEMENT UNIT 16 GA (IV SOLUTION) IMPLANT
IV KIT MINILOC 20X1 SAFETY (NEEDLE) IMPLANT
KIT CVR 48X5XPRB PLUP LF (MISCELLANEOUS) IMPLANT
KIT PORT POWER 8FR ISP CVUE (Miscellaneous) ×1 IMPLANT
NDL HYPO 25X1 1.5 SAFETY (NEEDLE) ×1 IMPLANT
NEEDLE HYPO 25X1 1.5 SAFETY (NEEDLE) ×2 IMPLANT
PACK BASIN DAY SURGERY FS (CUSTOM PROCEDURE TRAY) ×2 IMPLANT
PAD CLEANER CAUTERY TIP 5X5 (MISCELLANEOUS) ×1
PENCIL BUTTON HOLSTER BLD 10FT (ELECTRODE) ×2 IMPLANT
SET SHEATH INTRODUCER 10FR (MISCELLANEOUS) IMPLANT
SHEATH COOK PEEL AWAY SET 9F (SHEATH) IMPLANT
SLEEVE SCD COMPRESS KNEE MED (MISCELLANEOUS) ×2 IMPLANT
STRIP CLOSURE SKIN 1/4X4 (GAUZE/BANDAGES/DRESSINGS) ×2 IMPLANT
SUT ETHILON 2 0 FS 18 (SUTURE) IMPLANT
SUT ETHILON 3 0 PS 1 (SUTURE) IMPLANT
SUT MNCRL AB 4-0 PS2 18 (SUTURE) ×2 IMPLANT
SUT VICRYL 3-0 CR8 SH (SUTURE) ×2 IMPLANT
SYR 5ML LUER SLIP (SYRINGE) ×2 IMPLANT
SYR CONTROL 10ML LL (SYRINGE) ×2 IMPLANT
TOWEL OR 17X24 6PK STRL BLUE (TOWEL DISPOSABLE) ×2 IMPLANT
TOWEL OR NON WOVEN STRL DISP B (DISPOSABLE) ×2 IMPLANT

## 2017-08-28 NOTE — Discharge Instructions (Signed)
[facility] - DISCHARGE INSTRUCTIONS TO PATIENT  Activity:  Lifting - Take it easy for 3 or 4 days.  Then no limit.  Wound Care:   Leave incision dry for 2 days, then may shower  Diet:  As tolerated  Follow up appointment:  Call Dr. Pollie Friar office [facility]) at [phone] for an appointment in 2 weeks.  Medications and dosages:  Resume your home medications.  You have a prescription for:  Ultram  Call Dr. Lucia Gaskins or his office  [phone]) if you have:  Temperature greater than 100.4,  Persistent nausea and vomiting,  Severe uncontrolled pain,  Redness, tenderness, or signs of infection (pain, swelling, redness, odor or green/yellow discharge around the site),  Difficulty breathing, headache or visual disturbances,  Any other questions or concerns you may have after discharge.  In an emergency, call 911 or go to an Emergency Department at a nearby hospital.  Post Anesthesia Home Care Instructions  Activity: Get plenty of rest for the remainder of the day. A responsible individual must stay with you for 24 hours following the procedure.  For the next 24 hours, DO NOT: -Drive a car -Paediatric nurse -Drink alcoholic beverages -Take any medication unless instructed by your physician -Make any legal decisions or sign important papers.  Meals: Start with liquid foods such as gelatin or soup. Progress to regular foods as tolerated. Avoid greasy, spicy, heavy foods. If nausea and/or vomiting occur, drink only clear liquids until the nausea and/or vomiting subsides. Call your physician if vomiting continues.  Special Instructions/Symptoms: Your throat may feel dry or sore from the anesthesia or the breathing tube placed in your throat during surgery. If this causes discomfort, gargle with warm salt water. The discomfort should disappear within 24 hours.  If you had a scopolamine patch placed behind your ear for the management of post-  operative nausea and/or vomiting:  1. The medication in the patch is effective for 72 hours, after which it should be removed.  Wrap patch in a tissue and discard in the trash. Wash hands thoroughly with soap and water. 2. You may remove the patch earlier than 72 hours if you experience unpleasant side effects which may include dry mouth, dizziness or visual disturbances. 3. Avoid touching the patch. Wash your hands with soap and water after contact with the patch.

## 2017-08-28 NOTE — Interval H&P Note (Signed)
History and Physical Interval Note:  08/28/2017 9:38 AM  [name]  has presented today for surgery, with the diagnosis of recurrent seizures, need of IV access  The various methods of treatment have been discussed with the patient and family.  Her mother, Tye Maryland, is with her here today.  So is Theresia Lo, her therapy dog.  After consideration of risks, benefits and other options for treatment, the patient has consented to  Procedure(s): POWER PORT PLACEMENT (N/A) as a surgical intervention .  The patient's history has been reviewed, patient examined, no change in status, stable for surgery.  I have reviewed the patient's chart and labs.  Questions were answered to the patient's satisfaction.     Bassel Gaskill H

## 2017-08-28 NOTE — Progress Notes (Signed)
Patient awoke very anxious. VS stable, Dr Lucia Gaskins and Dr. Marcell Barlow at bedside.seizure precautions in place. Will continue to monitor patient.

## 2017-08-28 NOTE — Anesthesia Preprocedure Evaluation (Signed)
Anesthesia Evaluation  Patient identified by MRN, date of birth, ID band Patient awake    Reviewed: Allergy & Precautions, NPO status , Patient's Chart, lab work & pertinent test results  Airway Mallampati: II  TM Distance: >3 FB Neck ROM: Full    Dental no notable dental hx.    Pulmonary asthma ,    Pulmonary exam normal breath sounds clear to auscultation       Cardiovascular negative cardio ROS Normal cardiovascular exam Rhythm:Regular Rate:Normal     Neuro/Psych Seizures -,  Ataxia Brain cyst MERRF Syndrome negative psych ROS   GI/Hepatic negative GI ROS, Neg liver ROS,   Endo/Other  negative endocrine ROS  Renal/GU negative Renal ROS  negative genitourinary   Musculoskeletal negative musculoskeletal ROS (+)   Abdominal   Peds negative pediatric ROS (+)  Hematology negative hematology ROS (+)   Anesthesia Other Findings   Reproductive/Obstetrics negative OB ROS                            Anesthesia Physical Anesthesia Plan  ASA: II  Anesthesia Plan: General   Post-op Pain Management:    Induction: Intravenous  PONV Risk Score and Plan: 3 and Ondansetron, Dexamethasone, Midazolam and Treatment may vary due to age or medical condition  Airway Management Planned: LMA  Additional Equipment:   Intra-op Plan:   Post-operative Plan: Extubation in OR  Informed Consent: I have reviewed the patients History and Physical, chart, labs and discussed the procedure including the risks, benefits and alternatives for the proposed anesthesia with the patient or authorized representative who has indicated his/her understanding and acceptance.   Dental advisory given  Plan Discussed with: CRNA  Anesthesia Plan Comments:         Anesthesia Quick Evaluation

## 2017-08-28 NOTE — Anesthesia Procedure Notes (Signed)
Procedure Name: LMA Insertion Performed by: Terrance Mass Pre-anesthesia Checklist: Patient identified, Emergency Drugs available, Suction available and Patient being monitored Patient Re-evaluated:Patient Re-evaluated prior to induction Oxygen Delivery Method: Circle system utilized Preoxygenation: Pre-oxygenation with 100% oxygen Induction Type: IV induction Ventilation: Mask ventilation without difficulty LMA: LMA inserted LMA Size: 3.0 Number of attempts: 1 Placement Confirmation: positive ETCO2 Tube secured with: Tape Dental Injury: Teeth and Oropharynx as per pre-operative assessment

## 2017-08-28 NOTE — Anesthesia Postprocedure Evaluation (Signed)
Anesthesia Post Note  Patient: [name]  Procedure(s) Performed: Procedure(s) (LRB): POWER PORT PLACEMENT (Right)     Patient location during evaluation: PACU Anesthesia Type: General Level of consciousness: awake and alert Pain management: pain level controlled Vital Signs Assessment: post-procedure vital signs reviewed and stable Respiratory status: spontaneous breathing, nonlabored ventilation, respiratory function stable and patient connected to nasal cannula oxygen Cardiovascular status: blood pressure returned to baseline and stable Postop Assessment: no apparent nausea or vomiting Anesthetic complications: no    Last Vitals:  Vitals:   08/28/17 1130 08/28/17 1230  BP: 136/84 116/84  Pulse: 83 (!) 109  Resp: (!) 24 16  Temp:  36.9 C  SpO2: 100% 95%    Last Pain:  Vitals:   08/28/17 1009  TempSrc: Oral  PainSc:                  [name]

## 2017-08-28 NOTE — Op Note (Signed)
08/28/2017  11:15 AM  PATIENT:  [name], 26 y.o., female MRN: [id number] DOB: 12-19-1990  PREOP DIAGNOSIS:  recurrent seizures, need of IV access   POSTOP DIAGNOSIS:   recurrent seizures, need of IV access  PROCEDURE:   Procedure(s):  Right IJ POWER PORT PLACEMENT  SURGEON:   Alphonsa Overall, M.D.  ANESTHESIA:   general  Anesthesiologist: [name], MD CRNA: Terrance Mass, CRNA  General  EBL:  75  ml  COUNTS CORRECT:  YES  INDICATIONS FOR PROCEDURE:  [name] is a 27 y.o. (DOB: 09-Sep-1991) white female whose primary care physician is Nafziger, [name], NP and comes for power port placement.     The patient has MERRF syndrome with repeated seizures. She often needs IV access to control her seizures.  At times, she has required as many as 19 sticks to gain IV access.  So the port can be used to ease her IV access.   The indications and risks of the surgery were explained to the patient.  The risks include, but are not limited to, infection, bleeding, pneumothorax, nerve injury, and thrombosis of the vein.  OPERATIVE NOTE:  The patient was taken to Room #1 at [facility].  Anesthesia was provided by Anesthesiologist: [name], MD CRNA: Terrance Mass, CRNA.  At the beginning of the operation, the patient was given Cipro, had a roll placed under her back, and had the upper chest/neck prepped with Chloroprep and draped.   A time out was held and the surgery checklist reviewed.   The patient was placed in Trendelenburg position.  The right internal jugular vein was accessed with a 16 gauge needle and a guide wire threaded through the needle into the vein.  The position of the wire was checked with fluoroscopy.   I then developed a pocket in the upper inner aspect of the right chest for the port reservoir.  I used the [employer] for venous access.  The reservoir was sewn in place with a 3-0 Vicryl suture.  The reservoir had been flushed with dilute (10  units/cc) heparin.   I then passed the silastic tubing from the reservoir incision to the right internal jugular stick site and used the 8 French introducer to pass it into the vein.  The tip of the silastic catheter was position at the junction of the SVC and the right atrium under fluoroscopy.  The silastic catheter was then attached to the port with the bayonet device.     The entire port and tubing were checked with fluoroscopy and then the port was flushed with 4 cc of concentrated heparin (100 units/cc).   The wounds were then closed with 3-0 vicryl subcutaneous sutures and the skin closed with a 4-0 Monocryl suture.  The skin was painted with DermaBond.   The patient was transferred to the recovery room in good condition.  The sponge and needle count were correct at the end of the case.  A CXR is ordered for port placement and pending at the time of this note.  Alphonsa Overall, MD, [facility] Pager: [phone] Office phone:  [phone]

## 2017-08-28 NOTE — Transfer of Care (Signed)
Immediate Anesthesia Transfer of Care Note  Patient: [name]  Procedure(s) Performed: Procedure(s): POWER PORT PLACEMENT (Right)  Patient Location: PACU  Anesthesia Type:General  Level of Consciousness: awake and sedated  Airway & Oxygen Therapy: Patient Spontanous Breathing and Patient connected to face mask oxygen  Post-op Assessment: Report given to RN and Post -op Vital signs reviewed and stable  Post vital signs: Reviewed and stable  Last Vitals:  Vitals:   08/28/17 1009  BP: 121/86  Pulse: (!) 55  Resp: 16  Temp: 36.7 C  SpO2: 100%    Last Pain:  Vitals:   08/28/17 1009  TempSrc: Oral  PainSc:          Complications: No apparent anesthesia complications

## 2017-08-29 ENCOUNTER — Other Ambulatory Visit: Payer: PRIVATE HEALTH INSURANCE

## 2017-08-31 ENCOUNTER — Encounter (HOSPITAL_BASED_OUTPATIENT_CLINIC_OR_DEPARTMENT_OTHER): Payer: Self-pay | Admitting: Surgery

## 2017-09-02 ENCOUNTER — Telehealth: Payer: Self-pay | Admitting: Neurology

## 2017-09-02 NOTE — Telephone Encounter (Signed)
IV medication is not recommended to be given outside of the hospital, it can cause respiratory depression and needs to be monitored. The port should be used either in the hospital when they draw blood, or if they need it in the hospital. THanks

## 2017-09-02 NOTE — Telephone Encounter (Signed)
Pt called and wanted to speak to Deloit about some medication

## 2017-09-02 NOTE — Telephone Encounter (Signed)
Spoke with pt, she states that she is doing well with her new medication dosages.  She has experienced some dizziness but contributes that to lowering Lamictal.  She had a port placed this past week by Dr. Lucia Gaskins.   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  She states that this would help her tremendously as it will keep her from having to go to the ER for every seizure.  She leaves for [place] on Friday and will call our office next week after she gets a little settled. Please advise

## 2017-09-08 DIAGNOSIS — Z91040 Latex allergy status: Secondary | ICD-10-CM | POA: Diagnosis not present

## 2017-09-08 DIAGNOSIS — S43015A Anterior dislocation of left humerus, initial encounter: Secondary | ICD-10-CM | POA: Diagnosis not present

## 2017-09-08 DIAGNOSIS — X58XXXA Exposure to other specified factors, initial encounter: Secondary | ICD-10-CM | POA: Diagnosis not present

## 2017-09-08 DIAGNOSIS — E876 Hypokalemia: Secondary | ICD-10-CM | POA: Diagnosis not present

## 2017-09-08 DIAGNOSIS — Z8669 Personal history of other diseases of the nervous system and sense organs: Secondary | ICD-10-CM | POA: Diagnosis not present

## 2017-09-08 DIAGNOSIS — S43005A Unspecified dislocation of left shoulder joint, initial encounter: Secondary | ICD-10-CM | POA: Diagnosis not present

## 2017-09-08 DIAGNOSIS — S43005D Unspecified dislocation of left shoulder joint, subsequent encounter: Secondary | ICD-10-CM | POA: Diagnosis not present

## 2017-09-08 DIAGNOSIS — R569 Unspecified convulsions: Secondary | ICD-10-CM | POA: Diagnosis not present

## 2017-09-08 DIAGNOSIS — M25512 Pain in left shoulder: Secondary | ICD-10-CM | POA: Diagnosis not present

## 2017-09-09 ENCOUNTER — Telehealth: Payer: Self-pay | Admitting: Neurology

## 2017-09-09 NOTE — Telephone Encounter (Signed)
Received call at 1:45 AM from outside ER in [place].  As per ED provider, patient had been feeling ill with nausea and vomiting.  She likely vomited her medication and was having frequent seizures.  She was recently tapered off of Lamictal and started on Fycompa.  She is also taking Vimpat 50mg  twice daily and Keppra.  As per ED provider, the patient hasn't been doing as well with the recent changes (although Dr. Amparo Bristol note states otherwise) and has been having increased seizures.  Advised to give Keppra load in ED and increase Vimpat to 100mg  twice daly.  Advised that patient should contact Dr. Delice Lesch in the office during business hours for further recommendations.

## 2017-09-18 DIAGNOSIS — S43002A Unspecified subluxation of left shoulder joint, initial encounter: Secondary | ICD-10-CM | POA: Diagnosis not present

## 2017-09-18 DIAGNOSIS — M62838 Other muscle spasm: Secondary | ICD-10-CM | POA: Diagnosis not present

## 2017-09-18 DIAGNOSIS — Z681 Body mass index (BMI) 19 or less, adult: Secondary | ICD-10-CM | POA: Diagnosis not present

## 2017-09-18 DIAGNOSIS — M24419 Recurrent dislocation, unspecified shoulder: Secondary | ICD-10-CM | POA: Diagnosis not present

## 2017-09-18 DIAGNOSIS — S43015A Anterior dislocation of left humerus, initial encounter: Secondary | ICD-10-CM | POA: Diagnosis not present

## 2017-09-18 DIAGNOSIS — S43005A Unspecified dislocation of left shoulder joint, initial encounter: Secondary | ICD-10-CM | POA: Diagnosis not present

## 2017-09-18 DIAGNOSIS — M25511 Pain in right shoulder: Secondary | ICD-10-CM | POA: Diagnosis not present
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
## 2017-10-06 DIAGNOSIS — Z91040 Latex allergy status: Secondary | ICD-10-CM | POA: Diagnosis not present

## 2017-10-06 DIAGNOSIS — Y829 Unspecified medical devices associated with adverse incidents: Secondary | ICD-10-CM | POA: Diagnosis not present

## 2017-10-06 DIAGNOSIS — B340 Adenovirus infection, unspecified: Secondary | ICD-10-CM | POA: Diagnosis not present

## 2017-10-06 DIAGNOSIS — K9189 Other postprocedural complications and disorders of digestive system: Secondary | ICD-10-CM | POA: Diagnosis not present

## 2017-10-06 DIAGNOSIS — R569 Unspecified convulsions: Secondary | ICD-10-CM | POA: Diagnosis not present

## 2017-10-06 DIAGNOSIS — R238 Other skin changes: Secondary | ICD-10-CM | POA: Diagnosis not present

## 2017-10-06 DIAGNOSIS — T80212A Local infection due to central venous catheter, initial encounter: Secondary | ICD-10-CM | POA: Diagnosis not present

## 2017-10-06 DIAGNOSIS — R509 Fever, unspecified: Secondary | ICD-10-CM | POA: Diagnosis not present

## 2017-10-06 DIAGNOSIS — R5383 Other fatigue: Secondary | ICD-10-CM | POA: Diagnosis not present

## 2017-10-07 DIAGNOSIS — N644 Mastodynia: Secondary | ICD-10-CM | POA: Diagnosis not present

## 2017-10-07 DIAGNOSIS — L270 Generalized skin eruption due to drugs and medicaments taken internally: Secondary | ICD-10-CM | POA: Insufficient documentation

## 2017-10-07 DIAGNOSIS — T80219A Unspecified infection due to central venous catheter, initial encounter: Secondary | ICD-10-CM | POA: Insufficient documentation

## 2017-10-07 DIAGNOSIS — G40409 Other generalized epilepsy and epileptic syndromes, not intractable, without status epilepticus: Secondary | ICD-10-CM | POA: Diagnosis not present

## 2017-10-07 DIAGNOSIS — G40309 Generalized idiopathic epilepsy and epileptic syndromes, not intractable, without status epilepticus: Secondary | ICD-10-CM | POA: Diagnosis not present

## 2017-10-07 DIAGNOSIS — R569 Unspecified convulsions: Secondary | ICD-10-CM | POA: Diagnosis not present

## 2017-10-07 DIAGNOSIS — R222 Localized swelling, mass and lump, trunk: Secondary | ICD-10-CM | POA: Diagnosis not present

## 2017-10-07 DIAGNOSIS — L03313 Cellulitis of chest wall: Secondary | ICD-10-CM | POA: Diagnosis not present

## 2017-10-07 DIAGNOSIS — T80212A Local infection due to central venous catheter, initial encounter: Secondary | ICD-10-CM | POA: Diagnosis not present

## 2017-10-07 DIAGNOSIS — M25512 Pain in left shoulder: Secondary | ICD-10-CM | POA: Diagnosis not present

## 2017-10-07 DIAGNOSIS — R636 Underweight: Secondary | ICD-10-CM | POA: Diagnosis not present

## 2017-10-07 DIAGNOSIS — Y848 Other medical procedures as the cause of abnormal reaction of the patient, or of later complication, without mention of misadventure at the time of the procedure: Secondary | ICD-10-CM | POA: Diagnosis not present

## 2017-10-07 DIAGNOSIS — Z452 Encounter for adjustment and management of vascular access device: Secondary | ICD-10-CM | POA: Diagnosis not present

## 2017-10-07 DIAGNOSIS — Z91040 Latex allergy status: Secondary | ICD-10-CM | POA: Diagnosis not present

## 2017-10-07 DIAGNOSIS — L538 Other specified erythematous conditions: Secondary | ICD-10-CM | POA: Diagnosis not present

## 2017-10-07 DIAGNOSIS — M24412 Recurrent dislocation, left shoulder: Secondary | ICD-10-CM | POA: Diagnosis not present

## 2017-10-07 DIAGNOSIS — E8842 MERRF syndrome: Secondary | ICD-10-CM | POA: Diagnosis not present

## 2017-10-07 DIAGNOSIS — F419 Anxiety disorder, unspecified: Secondary | ICD-10-CM | POA: Diagnosis not present

## 2017-10-07 DIAGNOSIS — R0789 Other chest pain: Secondary | ICD-10-CM | POA: Diagnosis not present

## 2017-10-07 DIAGNOSIS — G8918 Other acute postprocedural pain: Secondary | ICD-10-CM | POA: Diagnosis not present

## 2017-10-07 DIAGNOSIS — Z681 Body mass index (BMI) 19 or less, adult: Secondary | ICD-10-CM | POA: Diagnosis not present

## 2017-10-07 DIAGNOSIS — G40419 Other generalized epilepsy and epileptic syndromes, intractable, without status epilepticus: Secondary | ICD-10-CM | POA: Diagnosis not present

## 2017-10-07 DIAGNOSIS — S43005A Unspecified dislocation of left shoulder joint, initial encounter: Secondary | ICD-10-CM | POA: Diagnosis not present

## 2017-10-08 DIAGNOSIS — L03313 Cellulitis of chest wall: Secondary | ICD-10-CM | POA: Diagnosis not present

## 2017-10-08 DIAGNOSIS — T80219A Unspecified infection due to central venous catheter, initial encounter: Secondary | ICD-10-CM | POA: Diagnosis not present

## 2017-10-08 DIAGNOSIS — Z452 Encounter for adjustment and management of vascular access device: Secondary | ICD-10-CM | POA: Diagnosis not present

## 2017-10-08 DIAGNOSIS — N644 Mastodynia: Secondary | ICD-10-CM | POA: Diagnosis not present

## 2017-10-09 DIAGNOSIS — M25512 Pain in left shoulder: Secondary | ICD-10-CM | POA: Diagnosis not present

## 2017-10-09 DIAGNOSIS — L03313 Cellulitis of chest wall: Secondary | ICD-10-CM | POA: Diagnosis not present

## 2017-10-10 DIAGNOSIS — G40409 Other generalized epilepsy and epileptic syndromes, not intractable, without status epilepticus: Secondary | ICD-10-CM | POA: Diagnosis not present

## 2017-10-10 DIAGNOSIS — R0789 Other chest pain: Secondary | ICD-10-CM | POA: Diagnosis not present

## 2017-10-10 DIAGNOSIS — L03313 Cellulitis of chest wall: Secondary | ICD-10-CM | POA: Diagnosis not present

## 2017-10-10 DIAGNOSIS — G8918 Other acute postprocedural pain: Secondary | ICD-10-CM | POA: Diagnosis not present

## 2017-10-10 DIAGNOSIS — M25512 Pain in left shoulder: Secondary | ICD-10-CM | POA: Diagnosis not present

## 2017-10-10 DIAGNOSIS — M24412 Recurrent dislocation, left shoulder: Secondary | ICD-10-CM | POA: Diagnosis not present

## 2017-10-10 DIAGNOSIS — S43005A Unspecified dislocation of left shoulder joint, initial encounter: Secondary | ICD-10-CM | POA: Diagnosis not present

## 2017-10-11 DIAGNOSIS — G40309 Generalized idiopathic epilepsy and epileptic syndromes, not intractable, without status epilepticus: Secondary | ICD-10-CM | POA: Diagnosis not present

## 2017-10-11 DIAGNOSIS — Z91040 Latex allergy status: Secondary | ICD-10-CM | POA: Diagnosis not present

## 2017-10-11 DIAGNOSIS — R0789 Other chest pain: Secondary | ICD-10-CM | POA: Diagnosis not present

## 2017-10-11 DIAGNOSIS — L03313 Cellulitis of chest wall: Secondary | ICD-10-CM | POA: Diagnosis not present

## 2017-10-11 DIAGNOSIS — R569 Unspecified convulsions: Secondary | ICD-10-CM | POA: Diagnosis not present

## 2017-10-11 DIAGNOSIS — E8842 MERRF syndrome: Secondary | ICD-10-CM | POA: Diagnosis not present

## 2017-10-27 ENCOUNTER — Telehealth: Payer: Self-pay | Admitting: Neurology

## 2017-10-27 NOTE — Telephone Encounter (Signed)
Pt called and said she was in the hospital and would like a call back to discuss, we needed to reschedule her appointment due to the office closing early on the 21st but she said she is only in town on the 19th,20th, and 21st

## 2017-11-02 NOTE — Telephone Encounter (Signed)
Called pt and LM stating that I Have her scheduled to come in on Wednesday, November 21 @ Noon.

## 2017-11-03 ENCOUNTER — Encounter: Payer: Self-pay | Admitting: Adult Health

## 2017-11-03 ENCOUNTER — Ambulatory Visit: Payer: PRIVATE HEALTH INSURANCE | Admitting: Adult Health

## 2017-11-03 VITALS — BP 100/60 | Temp 97.6°F

## 2017-11-03 DIAGNOSIS — G40909 Epilepsy, unspecified, not intractable, without status epilepticus: Secondary | ICD-10-CM
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
## 2017-11-04 ENCOUNTER — Ambulatory Visit: Payer: BLUE CROSS/BLUE SHIELD | Admitting: Neurology

## 2017-11-04 ENCOUNTER — Ambulatory Visit: Payer: PRIVATE HEALTH INSURANCE | Admitting: Neurology

## 2017-11-04 ENCOUNTER — Encounter: Payer: Self-pay | Admitting: Neurology

## 2017-11-04 DIAGNOSIS — G40019 Localization-related (focal) (partial) idiopathic epilepsy and epileptic syndromes with seizures of localized onset, intractable, without status epilepticus: Secondary | ICD-10-CM | POA: Diagnosis not present

## 2017-11-04 MED ORDER — PERAMPANEL 4 MG PO TABS: ORAL_TABLET | ORAL | 5 refills | Status: DC

## 2017-11-04 MED ORDER — KEPPRA 500 MG PO TABS: ORAL_TABLET | ORAL | 6 refills | Status: DC

## 2017-11-04 MED ORDER — LACOSAMIDE 100 MG PO TABS: ORAL_TABLET | ORAL | 5 refills | Status: DC

## 2017-11-04 MED ORDER — LAMOTRIGINE 50 MG PO TBDP: ORAL_TABLET | ORAL | 6 refills | Status: DC
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
## 2017-11-06 ENCOUNTER — Encounter: Payer: Self-pay | Admitting: Neurology

## 2017-11-09 ENCOUNTER — Telehealth (HOSPITAL_COMMUNITY): Payer: Self-pay

## 2017-11-09 NOTE — Telephone Encounter (Signed)
Called to schedule port placement, left message for pt to return call. AW 

## 2017-11-10 ENCOUNTER — Encounter: Payer: Self-pay | Admitting: Neurology

## 2017-11-10 DIAGNOSIS — S43005A Unspecified dislocation of left shoulder joint, initial encounter: Secondary | ICD-10-CM | POA: Diagnosis not present

## 2017-11-10 DIAGNOSIS — Y92813 Airplane as the place of occurrence of the external cause: Secondary | ICD-10-CM | POA: Diagnosis not present

## 2017-11-10 DIAGNOSIS — M25512 Pain in left shoulder: Secondary | ICD-10-CM | POA: Diagnosis not present

## 2017-11-10 DIAGNOSIS — M24411 Recurrent dislocation, right shoulder: Secondary | ICD-10-CM | POA: Diagnosis not present

## 2017-11-10 DIAGNOSIS — W1809XA Striking against other object with subsequent fall, initial encounter: Secondary | ICD-10-CM | POA: Diagnosis not present

## 2017-11-11 DIAGNOSIS — W19XXXA Unspecified fall, initial encounter: Secondary | ICD-10-CM | POA: Diagnosis not present

## 2017-11-11 DIAGNOSIS — W06XXXA Fall from bed, initial encounter: Secondary | ICD-10-CM | POA: Diagnosis not present

## 2017-11-11 DIAGNOSIS — S43015A Anterior dislocation of left humerus, initial encounter: Secondary | ICD-10-CM | POA: Diagnosis not present

## 2017-11-11 DIAGNOSIS — S4992XA Unspecified injury of left shoulder and upper arm, initial encounter: Secondary | ICD-10-CM | POA: Diagnosis not present

## 2017-11-11 DIAGNOSIS — Z91040 Latex allergy status: Secondary | ICD-10-CM | POA: Diagnosis not present

## 2017-11-11 DIAGNOSIS — M25512 Pain in left shoulder: Secondary | ICD-10-CM | POA: Diagnosis not present

## 2017-11-11 DIAGNOSIS — G40209 Localization-related (focal) (partial) symptomatic epilepsy and epileptic syndromes with complex partial seizures, not intractable, without status epilepticus: Secondary | ICD-10-CM | POA: Diagnosis not present

## 2017-11-11 DIAGNOSIS — S43005A Unspecified dislocation of left shoulder joint, initial encounter: Secondary | ICD-10-CM | POA: Diagnosis not present

## 2017-11-11 DIAGNOSIS — Y92003 Bedroom of unspecified non-institutional (private) residence as the place of occurrence of the external cause: Secondary | ICD-10-CM | POA: Diagnosis not present

## 2017-11-11 DIAGNOSIS — Z8739 Personal history of other diseases of the musculoskeletal system and connective tissue: Secondary | ICD-10-CM | POA: Diagnosis not present

## 2017-11-12 DIAGNOSIS — Z3202 Encounter for pregnancy test, result negative: Secondary | ICD-10-CM | POA: Diagnosis not present

## 2017-11-12 DIAGNOSIS — R109 Unspecified abdominal pain: Secondary | ICD-10-CM | POA: Diagnosis not present

## 2017-11-12 DIAGNOSIS — G40209 Localization-related (focal) (partial) symptomatic epilepsy and epileptic syndromes with complex partial seizures, not intractable, without status epilepticus: Secondary | ICD-10-CM | POA: Diagnosis not present

## 2017-11-12 DIAGNOSIS — S8012XA Contusion of left lower leg, initial encounter: Secondary | ICD-10-CM | POA: Diagnosis not present

## 2017-11-12 DIAGNOSIS — R569 Unspecified convulsions: Secondary | ICD-10-CM | POA: Diagnosis not present

## 2017-11-12 DIAGNOSIS — Y92813 Airplane as the place of occurrence of the external cause: Secondary | ICD-10-CM | POA: Diagnosis not present

## 2017-11-12 DIAGNOSIS — S3991XA Unspecified injury of abdomen, initial encounter: Secondary | ICD-10-CM | POA: Diagnosis not present

## 2017-11-12 DIAGNOSIS — S3983XA Other specified injuries of pelvis, initial encounter: Secondary | ICD-10-CM | POA: Diagnosis not present

## 2017-11-12 DIAGNOSIS — S43012A Anterior subluxation of left humerus, initial encounter: Secondary | ICD-10-CM | POA: Diagnosis not present

## 2017-11-12 DIAGNOSIS — W19XXXA Unspecified fall, initial encounter: Secondary | ICD-10-CM | POA: Diagnosis not present

## 2017-11-12 DIAGNOSIS — G40909 Epilepsy, unspecified, not intractable, without status epilepticus: Secondary | ICD-10-CM | POA: Diagnosis not present

## 2017-11-12 DIAGNOSIS — S0083XA Contusion of other part of head, initial encounter: Secondary | ICD-10-CM | POA: Diagnosis not present

## 2017-11-12 DIAGNOSIS — S43015A Anterior dislocation of left humerus, initial encounter: Secondary | ICD-10-CM | POA: Diagnosis not present

## 2017-11-12 DIAGNOSIS — R296 Repeated falls: Secondary | ICD-10-CM | POA: Diagnosis not present

## 2017-11-12 DIAGNOSIS — S70212A Abrasion, left hip, initial encounter: Secondary | ICD-10-CM | POA: Diagnosis not present

## 2017-11-12 DIAGNOSIS — N83202 Unspecified ovarian cyst, left side: Secondary | ICD-10-CM | POA: Diagnosis not present

## 2017-11-12 DIAGNOSIS — E8842 MERRF syndrome: Secondary | ICD-10-CM | POA: Diagnosis not present

## 2017-11-13 DIAGNOSIS — S3991XA Unspecified injury of abdomen, initial encounter: Secondary | ICD-10-CM | POA: Diagnosis not present

## 2017-12-02 ENCOUNTER — Ambulatory Visit: Payer: Self-pay | Admitting: Surgery

## 2017-12-02 ENCOUNTER — Other Ambulatory Visit: Payer: Self-pay

## 2017-12-02 ENCOUNTER — Encounter (HOSPITAL_COMMUNITY): Payer: Self-pay | Admitting: *Deleted

## 2017-12-02 NOTE — Progress Notes (Signed)
Spoke with pt for pre-op call. Pt denies cardiac history. Pt does have Epilepsy, but has not had a recent seizure. Pt states she has a service dog for her seizures and she requests that he be with her in pre-op.

## 2017-12-03 NOTE — H&P (Signed)
[name]  Location:  [facility] Patient #: [id number] DOB: Sep 18, 1991 Single / Language: Cleophus Molt / Race: White Female  History of Present Illness   The patient is a 26 year old female who presents with a complaint of need of iv access.  The PCP is Dr. Talbert Forest  The patient was referred by Dr. Ala Bent.  She came by herself. She has Theresia Lo, her service dog, with her.  I did a right IJ power port on her on 08/28/2017 at [facility]. Unfortunately, the skin over the port broke down about one month after surgery. So the port was removed in [place]. She is here for another port placement this Friday, 12/21. We again went over the risks of the surgery and port placement. We will try using a smaller port.  History of need for IV access: The patient has MERRF syndrome with repeated seizures. She often needs IV access to control her seizures. One time she says she was stuck as many as 19 times to find a vein. She was seen Dr. Brett Fairy for neurology, but has switched to Dr. Ellouise Newer. She is currently getting some medication adjusted to try to better control her seizures. She is currently in school in [place] getting a PhD in [place]. She got her undergraduate degree at [employer] and her masters in [place]. With her seizures, she's had multiple dislocations of her shoulder. She has active dislocation of her left shoulder right now. She has seen Dr. Domingo Cocking at [facility] and Dr. Lynann Bologna in [place]. Apparently because her seizures are so poorly controlled, Dr. Domingo Cocking has been hesitant to operate on her left shoulder. She takes oral medication for her seizures, who requires IV Ativan when she has complex partial seizures, in hopes of not going into full seizures.  I discussed the indications and potential complications of the power port placement.  The primary complications of the power port, include, but are  not limited to, bleeding, infection, nerve injury, thrombosis, and pneumothorax.    Past Medical History: 1. Seizures 2. 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 Lap appendectomy - [facility] - 2012 5. Hearing loss  Social History: Unmarried. Has service dog, Theresia Lo (for 6 years) Lives with Mom on [address] [place]. Does not drive. Lives with roommates in [place]. 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  Note, I took care of her grandmother, Domenick Gong.   Allergies (April Staton, CMA; 12/02/2017 8:41 AM) Shellfish  Anaphylaxis. Cefuroxime Axetil *CEPHALOSPORINS*  Midazolam HCl *HYPNOTICS/SEDATIVES/SLEEP DISORDER AGENTS*  Nausea, Vomiting. Versed *HYPNOTICS/SEDATIVES/SLEEP DISORDER AGENTS*   Medication History (April Staton, CMA; 12/02/2017 8:41 AM) Aczone (7.5% Gel, External) Active. Co Q 10 (10MG  Capsule, Oral) Active. Ibuprofen (200MG  Capsule, Oral) Active. Vimpat (50MG  Tablet, Oral) Active. LaMICtal (200MG  Tablet, Oral) Active. Keppra (500MG  Tablet, Oral) Active. Ativan (2MG /ML Solution, Injection) Active. Ativan (0.5MG  Tablet, Oral) Active. Medications Reconciled  Vitals (April Staton CMA; 12/02/2017 8:41 AM) 12/02/2017 8:41 AM Weight: 108 lb Height: 64in Weight was reported by patient. Body Surface Area: 1.51 m Body Mass Index: 18.54 kg/m  Temp.: 97.28F(Oral)  Pulse: 63 (Regular)  P.OX: 98% (Room air) BP: 98/64 (Sitting, Left Arm, Standard)   Physical Exam  General: Thin WF alert and generally healthy appearing. She speaks with a lisp. HEENT: Normal. Pupils equal.  Neck: Supple. No mass. No thyroid mass.  Lymph Nodes: No supraclavicular or  cervical nodes.  Chest: Wound in right upper chest looks good. I pointed to where we plan to do the surgery again. Heart:  RRR Lungs:   Clear  Assessment & Plan  1.  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 Zaiya Annunziato  Plan:  1) Repeat placement of a power port this Friday, 12/04/2017  2. Seizures 3. 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 Lap appendectomy - [facility] - 2012 6. Hearing loss  Alphonsa Overall, MD, [facility] Pager: [phone] Office phone:  [phone]

## 2017-12-04 ENCOUNTER — Ambulatory Visit (HOSPITAL_COMMUNITY): Payer: BLUE CROSS/BLUE SHIELD

## 2017-12-04 ENCOUNTER — Encounter (HOSPITAL_COMMUNITY): Payer: Self-pay

## 2017-12-04 ENCOUNTER — Observation Stay (HOSPITAL_COMMUNITY)
Admission: RE | Admit: 2017-12-04 | Discharge: 2017-12-06 | Disposition: A | Discharge status: Home / Self Care | Payer: BLUE CROSS/BLUE SHIELD | Source: Ambulatory Visit | Attending: Surgery | Admitting: Surgery

## 2017-12-04 ENCOUNTER — Encounter (HOSPITAL_COMMUNITY)
Admission: RE | Disposition: A | Discharge status: Home / Self Care | Payer: Self-pay | Source: Ambulatory Visit | Attending: Surgery

## 2017-12-04 ENCOUNTER — Other Ambulatory Visit: Payer: Self-pay

## 2017-12-04 ENCOUNTER — Ambulatory Visit (HOSPITAL_COMMUNITY): Payer: BLUE CROSS/BLUE SHIELD | Admitting: Certified Registered Nurse Anesthetist

## 2017-12-04 DIAGNOSIS — Z452 Encounter for adjustment and management of vascular access device: Secondary | ICD-10-CM | POA: Diagnosis not present

## 2017-12-04 DIAGNOSIS — G40309 Generalized idiopathic epilepsy and epileptic syndromes, not intractable, without status epilepticus: Secondary | ICD-10-CM | POA: Diagnosis not present

## 2017-12-04 DIAGNOSIS — Z419 Encounter for procedure for purposes other than remedying health state, unspecified: Secondary | ICD-10-CM

## 2017-12-04 DIAGNOSIS — R569 Unspecified convulsions: Secondary | ICD-10-CM

## 2017-12-04 DIAGNOSIS — E8842 MERRF syndrome: Principal | ICD-10-CM | POA: Insufficient documentation

## 2017-12-04 DIAGNOSIS — G43909 Migraine, unspecified, not intractable, without status migrainosus: Secondary | ICD-10-CM | POA: Diagnosis not present

## 2017-12-04 DIAGNOSIS — Z79899 Other long term (current) drug therapy: Secondary | ICD-10-CM | POA: Diagnosis not present

## 2017-12-04 DIAGNOSIS — I872 Venous insufficiency (chronic) (peripheral): Secondary | ICD-10-CM | POA: Diagnosis not present

## 2017-12-04 DIAGNOSIS — J45909 Unspecified asthma, uncomplicated: Secondary | ICD-10-CM | POA: Diagnosis not present

## 2017-12-04 DIAGNOSIS — Z880 Allergy status to penicillin: Secondary | ICD-10-CM | POA: Diagnosis not present

## 2017-12-04 DIAGNOSIS — Z4682 Encounter for fitting and adjustment of non-vascular catheter: Secondary | ICD-10-CM | POA: Diagnosis not present

## 2017-12-04 DIAGNOSIS — Z888 Allergy status to other drugs, medicaments and biological substances status: Secondary | ICD-10-CM | POA: Insufficient documentation

## 2017-12-04 DIAGNOSIS — E876 Hypokalemia: Secondary | ICD-10-CM | POA: Diagnosis not present

## 2017-12-04 DIAGNOSIS — Z95828 Presence of other vascular implants and grafts: Secondary | ICD-10-CM

## 2017-12-04 HISTORY — PX: PORTACATH PLACEMENT: SHX2246

## 2017-12-04 HISTORY — DX: Unspecified asthma, uncomplicated: J45.909

## 2017-12-04 HISTORY — DX: Other specified postprocedural states: R11.2

## 2017-12-04 HISTORY — DX: Other specified postprocedural states: Z98.890

## 2017-12-04 HISTORY — DX: Anemia, unspecified: D64.9

## 2017-12-04 HISTORY — DX: Adverse effect of unspecified anesthetic, initial encounter: T41.45XA

## 2017-12-04 HISTORY — DX: Other complications of anesthesia, initial encounter: T88.59XA

## 2017-12-04 HISTORY — DX: Constipation, unspecified: K59.00

## 2017-12-04 LAB — CBC
HEMATOCRIT: 40.6 % (ref 36.0–46.0)
HEMOGLOBIN: 13.4 g/dL (ref 12.0–15.0)
MCH: 29.1 pg (ref 26.0–34.0)
MCHC: 33 g/dL (ref 30.0–36.0)
MCV: 88.3 fL (ref 78.0–100.0)
Platelets: 352 10*3/uL (ref 150–400)
RBC: 4.6 MIL/uL (ref 3.87–5.11)
RDW: 13.1 % (ref 11.5–15.5)
WBC: 5.4 10*3/uL (ref 4.0–10.5)

## 2017-12-04 LAB — BASIC METABOLIC PANEL
Anion gap: 7 (ref 5–15)
BUN: 8 mg/dL (ref 6–20)
CHLORIDE: 108 mmol/L (ref 101–111)
CO2: 24 mmol/L (ref 22–32)
Calcium: 9.4 mg/dL (ref 8.9–10.3)
Creatinine, Ser: 0.6 mg/dL (ref 0.44–1.00)
GFR calc Af Amer: >60 mL/min (ref >60)
GFR calc non Af Amer: >60 mL/min (ref >60)
GLUCOSE: 108 mg/dL — AB (ref 65–99)
POTASSIUM: 3.4 mmol/L — AB (ref 3.5–5.1)
Sodium: 139 mmol/L (ref 135–145)

## 2017-12-04 LAB — HCG, SERUM, QUALITATIVE: Preg, Serum: NEGATIVE

## 2017-12-04 SURGERY — INSERTION, TUNNELED CENTRAL VENOUS DEVICE, WITH PORT
Anesthesia: General

## 2017-12-04 MED ORDER — HYDROMORPHONE HCL 1 MG/ML IJ SOLN
INTRAMUSCULAR | Status: AC
  Administered 2017-12-04: 0.5 mg via INTRAVENOUS
  Filled 2017-12-04: qty 1

## 2017-12-04 MED ORDER — DEXAMETHASONE SODIUM PHOSPHATE 10 MG/ML IJ SOLN
INTRAMUSCULAR | Status: DC | PRN
  Administered 2017-12-04: 10 mg via INTRAVENOUS

## 2017-12-04 MED ORDER — SODIUM CHLORIDE 0.9 % IV SOLN
INTRAVENOUS | Status: DC | PRN
  Administered 2017-12-04: 16:00:00

## 2017-12-04 MED ORDER — CHLORHEXIDINE GLUCONATE CLOTH 2 % EX PADS: 6.0000 | MEDICATED_PAD | Freq: Once | CUTANEOUS | Status: DC

## 2017-12-04 MED ORDER — IOPAMIDOL (ISOVUE-300) INJECTION 61%
INTRAVENOUS | Status: AC
  Filled 2017-12-04: qty 50

## 2017-12-04 MED ORDER — LORAZEPAM 2 MG/ML IJ SOLN
INTRAMUSCULAR | Status: AC
  Administered 2017-12-04: 2 mg via INTRAVENOUS
  Filled 2017-12-04: qty 1

## 2017-12-04 MED ORDER — LAMOTRIGINE ER 200 MG PO TB24: 200.0000 mg | ORAL_TABLET | Freq: Two times a day (BID) | ORAL | Status: DC

## 2017-12-04 MED ORDER — BUPIVACAINE-EPINEPHRINE (PF) 0.25% -1:200000 IJ SOLN
INTRAMUSCULAR | Status: AC
  Filled 2017-12-04: qty 30

## 2017-12-04 MED ORDER — LEVETIRACETAM 500 MG PO TABS
500.0000 mg | ORAL_TABLET | Freq: Two times a day (BID) | ORAL | Status: DC
  Administered 2017-12-05 (×2): 500 mg via ORAL
  Filled 2017-12-04 (×2): qty 1

## 2017-12-04 MED ORDER — PROPOFOL 10 MG/ML IV BOLUS
INTRAVENOUS | Status: AC
  Filled 2017-12-04: qty 20

## 2017-12-04 MED ORDER — PROPOFOL 10 MG/ML IV BOLUS
INTRAVENOUS | Status: DC | PRN
  Administered 2017-12-04: 10 mg via INTRAVENOUS
  Administered 2017-12-04: 180 mg via INTRAVENOUS

## 2017-12-04 MED ORDER — LORAZEPAM 2 MG/ML IJ SOLN
2.0000 mg | INTRAMUSCULAR | Status: AC | PRN
  Administered 2017-12-04: 2 mg via INTRAVENOUS
  Filled 2017-12-04: qty 1

## 2017-12-04 MED ORDER — LACOSAMIDE 50 MG PO TABS
100.0000 mg | ORAL_TABLET | Freq: Two times a day (BID) | ORAL | Status: DC
  Administered 2017-12-04 – 2017-12-05 (×3): 100 mg via ORAL
  Filled 2017-12-04 (×4): qty 2

## 2017-12-04 MED ORDER — ONDANSETRON 4 MG PO TBDP
4.0000 mg | ORAL_TABLET | Freq: Four times a day (QID) | ORAL | Status: DC | PRN
  Administered 2017-12-05: 4 mg via ORAL
  Filled 2017-12-04: qty 1

## 2017-12-04 MED ORDER — PHENYLEPHRINE HCL 10 MG/ML IJ SOLN
INTRAMUSCULAR | Status: DC | PRN
  Administered 2017-12-04 (×4): 20 ug via INTRAVENOUS

## 2017-12-04 MED ORDER — LORAZEPAM 2 MG/ML IJ SOLN
1.0000 mg | INTRAMUSCULAR | Status: DC | PRN
  Administered 2017-12-04: 2 mg via INTRAVENOUS
  Filled 2017-12-04: qty 1

## 2017-12-04 MED ORDER — FENTANYL CITRATE (PF) 100 MCG/2ML IJ SOLN
INTRAMUSCULAR | Status: DC | PRN
  Administered 2017-12-04: 100 ug via INTRAVENOUS
  Administered 2017-12-04: 25 ug via INTRAVENOUS

## 2017-12-04 MED ORDER — SODIUM CHLORIDE 0.9 % IV SOLN
1000.0000 mg | INTRAVENOUS | Status: AC
  Administered 2017-12-04: 1000 mg via INTRAVENOUS
  Filled 2017-12-04: qty 10

## 2017-12-04 MED ORDER — BUPIVACAINE-EPINEPHRINE 0.25% -1:200000 IJ SOLN
INTRAMUSCULAR | Status: DC | PRN
  Administered 2017-12-04: 8 mL

## 2017-12-04 MED ORDER — POTASSIUM CHLORIDE CRYS ER 20 MEQ PO TBCR
40.0000 meq | EXTENDED_RELEASE_TABLET | ORAL | Status: AC
  Filled 2017-12-04: qty 2

## 2017-12-04 MED ORDER — OXYCODONE HCL 5 MG/5ML PO SOLN: 5.0000 mg | Freq: Once | ORAL | Status: DC | PRN

## 2017-12-04 MED ORDER — SODIUM CHLORIDE 0.9 % IV SOLN
75.0000 mL/h | INTRAVENOUS | Status: DC
  Administered 2017-12-04: 75 mL/h via INTRAVENOUS

## 2017-12-04 MED ORDER — FAMOTIDINE IN NACL 20-0.9 MG/50ML-% IV SOLN
20.0000 mg | INTRAVENOUS | Status: AC
  Administered 2017-12-04: 20 mg via INTRAVENOUS

## 2017-12-04 MED ORDER — LIDOCAINE HCL (CARDIAC) 20 MG/ML IV SOLN
INTRAVENOUS | Status: DC | PRN
  Administered 2017-12-04: 60 mg via INTRAVENOUS

## 2017-12-04 MED ORDER — FENTANYL CITRATE (PF) 250 MCG/5ML IJ SOLN
INTRAMUSCULAR | Status: AC
  Filled 2017-12-04: qty 5

## 2017-12-04 MED ORDER — HEPARIN SOD (PORK) LOCK FLUSH 100 UNIT/ML IV SOLN
INTRAVENOUS | Status: AC
  Filled 2017-12-04: qty 5

## 2017-12-04 MED ORDER — HYDROMORPHONE HCL 1 MG/ML IJ SOLN
0.2500 mg | INTRAMUSCULAR | Status: DC | PRN
Start: 2017-12-04 — End: 2017-12-04
  Administered 2017-12-04 (×4): 0.5 mg via INTRAVENOUS

## 2017-12-04 MED ORDER — LORAZEPAM 2 MG/ML IJ SOLN
INTRAMUSCULAR | Status: DC | PRN
  Administered 2017-12-04: 2 mg via INTRAVENOUS

## 2017-12-04 MED ORDER — HEPARIN SOD (PORK) LOCK FLUSH 100 UNIT/ML IV SOLN
INTRAVENOUS | Status: DC | PRN
  Administered 2017-12-04: 500 [IU]

## 2017-12-04 MED ORDER — LAMOTRIGINE 100 MG PO TABS
200.0000 mg | ORAL_TABLET | Freq: Two times a day (BID) | ORAL | Status: DC
  Administered 2017-12-04 – 2017-12-05 (×3): 200 mg via ORAL
  Filled 2017-12-04 (×4): qty 2

## 2017-12-04 MED ORDER — LEVETIRACETAM 500 MG PO TABS
1000.0000 mg | ORAL_TABLET | Freq: Every day | ORAL | Status: DC
  Administered 2017-12-05: 1000 mg via ORAL
  Filled 2017-12-04 (×2): qty 2

## 2017-12-04 MED ORDER — PERAMPANEL 4 MG PO TABS
8.0000 mg | ORAL_TABLET | Freq: Every day | ORAL | Status: DC
  Filled 2017-12-04: qty 2

## 2017-12-04 MED ORDER — HYDROMORPHONE HCL 1 MG/ML IJ SOLN: 0.5000 mg | INTRAMUSCULAR | Status: DC | PRN

## 2017-12-04 MED ORDER — LORAZEPAM 0.5 MG PO TABS: 0.5000 mg | ORAL_TABLET | Freq: Three times a day (TID) | ORAL | Status: DC | PRN

## 2017-12-04 MED ORDER — FAMOTIDINE IN NACL 20-0.9 MG/50ML-% IV SOLN
20.0000 mg | Freq: Once | INTRAVENOUS | Status: DC
  Filled 2017-12-04: qty 50

## 2017-12-04 MED ORDER — HYDROCODONE-ACETAMINOPHEN 5-325 MG PO TABS: 1.0000 | ORAL_TABLET | Freq: Four times a day (QID) | ORAL | 0 refills | Status: DC | PRN

## 2017-12-04 MED ORDER — ONDANSETRON HCL 4 MG/2ML IJ SOLN
INTRAMUSCULAR | Status: DC | PRN
  Administered 2017-12-04: 4 mg via INTRAVENOUS

## 2017-12-04 MED ORDER — CIPROFLOXACIN IN D5W 400 MG/200ML IV SOLN
400.0000 mg | INTRAVENOUS | Status: AC
  Administered 2017-12-04: 400 mg via INTRAVENOUS
  Filled 2017-12-04: qty 200

## 2017-12-04 MED ORDER — PROMETHAZINE HCL 25 MG/ML IJ SOLN
6.2500 mg | INTRAMUSCULAR | Status: DC | PRN
Start: 2017-12-04 — End: 2017-12-04

## 2017-12-04 MED ORDER — ONDANSETRON HCL 4 MG/2ML IJ SOLN: 4.0000 mg | Freq: Four times a day (QID) | INTRAMUSCULAR | Status: DC | PRN

## 2017-12-04 MED ORDER — PERAMPANEL 4 MG PO TABS
8.0000 mg | ORAL_TABLET | Freq: Every day | ORAL | Status: DC
  Administered 2017-12-04 – 2017-12-06 (×2): 8 mg via ORAL
  Filled 2017-12-04 (×2): qty 2

## 2017-12-04 MED ORDER — HYDROCODONE-ACETAMINOPHEN 5-325 MG PO TABS
1.0000 | ORAL_TABLET | ORAL | Status: DC | PRN
  Administered 2017-12-05: 2 via ORAL
  Filled 2017-12-04 (×2): qty 1

## 2017-12-04 MED ORDER — 0.9 % SODIUM CHLORIDE (POUR BTL) OPTIME
TOPICAL | Status: DC | PRN
  Administered 2017-12-04: 1000 mL

## 2017-12-04 MED ORDER — IBUPROFEN 200 MG PO TABS
800.0000 mg | ORAL_TABLET | Freq: Three times a day (TID) | ORAL | Status: DC | PRN
  Administered 2017-12-04 – 2017-12-05 (×2): 800 mg via ORAL
  Filled 2017-12-04 (×2): qty 4

## 2017-12-04 MED ORDER — LACTATED RINGERS IV SOLN
INTRAVENOUS | Status: DC
  Administered 2017-12-04 (×2): via INTRAVENOUS

## 2017-12-04 MED ORDER — LEVETIRACETAM 500 MG PO TABS
1000.0000 mg | ORAL_TABLET | Freq: Three times a day (TID) | ORAL | Status: DC
  Administered 2017-12-04: 1000 mg via ORAL
  Filled 2017-12-04: qty 2

## 2017-12-04 MED ORDER — OXYCODONE HCL 5 MG PO TABS: 5.0000 mg | ORAL_TABLET | Freq: Once | ORAL | Status: DC | PRN

## 2017-12-04 MED ORDER — GLYCOPYRROLATE 0.2 MG/ML IJ SOLN
INTRAMUSCULAR | Status: DC | PRN
  Administered 2017-12-04: 0.2 mg via INTRAVENOUS

## 2017-12-04 SURGICAL SUPPLY — 48 items
BAG DECANTER FOR FLEXI CONT (MISCELLANEOUS) ×2 IMPLANT
BLADE SURG 15 STRL LF DISP TIS (BLADE) IMPLANT
BLADE SURG 15 STRL SS (BLADE) ×2
CHLORAPREP W/TINT 10.5 ML (MISCELLANEOUS) ×2 IMPLANT
CLOSURE STERI-STRIP 1/4X4 (GAUZE/BANDAGES/DRESSINGS) ×1 IMPLANT
COVER SURGICAL LIGHT HANDLE (MISCELLANEOUS) ×2 IMPLANT
COVER TRANSDUCER ULTRASND GEL (DRAPE) ×1 IMPLANT
CRADLE DONUT ADULT HEAD (MISCELLANEOUS) ×2 IMPLANT
DRAPE C-ARM 42X72 X-RAY (DRAPES) ×2 IMPLANT
DRAPE CHEST BREAST 15X10 FENES (DRAPES) ×2 IMPLANT
DRAPE UTILITY XL STRL (DRAPES) ×2 IMPLANT
ELECT CAUTERY BLADE 6.4 (BLADE) ×2 IMPLANT
ELECT REM PT RETURN 9FT ADLT (ELECTROSURGICAL) ×2
ELECTRODE REM PT RTRN 9FT ADLT (ELECTROSURGICAL) ×1 IMPLANT
GAUZE SPONGE 4X4 12PLY STRL (GAUZE/BANDAGES/DRESSINGS) ×2 IMPLANT
GAUZE SPONGE 4X4 16PLY XRAY LF (GAUZE/BANDAGES/DRESSINGS) ×2 IMPLANT
GEL ULTRASOUND 20GR AQUASONIC (MISCELLANEOUS) IMPLANT
GLOVE BIOGEL PI IND STRL 6 (GLOVE) IMPLANT
GLOVE BIOGEL PI IND STRL 6.5 (GLOVE) IMPLANT
GLOVE BIOGEL PI IND STRL 7.5 (GLOVE) IMPLANT
GLOVE BIOGEL PI INDICATOR 6 (GLOVE) ×1
GLOVE BIOGEL PI INDICATOR 6.5 (GLOVE) ×1
GLOVE BIOGEL PI INDICATOR 7.5 (GLOVE) ×1
GOWN STRL REUS W/ TWL LRG LVL3 (GOWN DISPOSABLE) ×1 IMPLANT
GOWN STRL REUS W/ TWL XL LVL3 (GOWN DISPOSABLE) ×1 IMPLANT
GOWN STRL REUS W/TWL LRG LVL3 (GOWN DISPOSABLE) ×2
GOWN STRL REUS W/TWL XL LVL3 (GOWN DISPOSABLE) ×2
INTRODUCER 13FR (MISCELLANEOUS) IMPLANT
KIT BASIN OR (CUSTOM PROCEDURE TRAY) ×2 IMPLANT
KIT POWER PORT SLIM 6FR (Port) ×1 IMPLANT
KIT ROOM TURNOVER OR (KITS) ×2 IMPLANT
NDL HYPO 25GX1X1/2 BEV (NEEDLE) ×1 IMPLANT
NEEDLE HYPO 25GX1X1/2 BEV (NEEDLE) ×2 IMPLANT
NS IRRIG 1000ML POUR BTL (IV SOLUTION) ×2 IMPLANT
PACK SURGICAL SETUP 50X90 (CUSTOM PROCEDURE TRAY) ×2 IMPLANT
PAD ARMBOARD 7.5X6 YLW CONV (MISCELLANEOUS) ×2 IMPLANT
PENCIL BUTTON HOLSTER BLD 10FT (ELECTRODE) ×2 IMPLANT
POWER PORT SLIM 6 FRENCH ×1 IMPLANT
SET SHEATH INTRODUCER 10FR (MISCELLANEOUS) IMPLANT
SHEATH COOK PEEL AWAY SET 9F (SHEATH) IMPLANT
STRIP CLOSURE SKIN 1/2X4 (GAUZE/BANDAGES/DRESSINGS) ×1 IMPLANT
SUT MNCRL AB 4-0 PS2 18 (SUTURE) ×2 IMPLANT
SUT VIC AB 3-0 SH 18 (SUTURE) ×2 IMPLANT
SYR 20ML ECCENTRIC (SYRINGE) ×4 IMPLANT
SYR 5ML LUER SLIP (SYRINGE) ×2 IMPLANT
SYR CONTROL 10ML LL (SYRINGE) ×2 IMPLANT
TOWEL OR 17X24 6PK STRL BLUE (TOWEL DISPOSABLE) ×2 IMPLANT
TOWEL OR 17X26 10 PK STRL BLUE (TOWEL DISPOSABLE) ×2 IMPLANT

## 2017-12-04 NOTE — Interval H&P Note (Signed)
History and Physical Interval Note:  12/04/2017 2:52 PM  [name]  has presented today for surgery, with the diagnosis of Need of IV access, MEERF syndrome  The various methods of treatment have been discussed with the patient and family.   Her mother is in the waiting area.  After consideration of risks, benefits and other options for treatment, the patient has consented to  Procedure(s): INSERTION PORT-A-CATH (N/A) as a surgical intervention .  The patient's history has been reviewed, patient examined, no change in status, stable for surgery.  I have reviewed the patient's chart and labs.  Questions were answered to the patient's satisfaction.     Shann Medal

## 2017-12-04 NOTE — Anesthesia Preprocedure Evaluation (Addendum)
Anesthesia Evaluation  Patient identified by MRN, date of birth, ID band Patient awake    Reviewed: Allergy & Precautions, NPO status , Patient's Chart, lab work & pertinent test results  History of Anesthesia Complications (+) PONV and history of anesthetic complications  Airway Mallampati: II  TM Distance: >3 FB Neck ROM: Full    Dental no notable dental hx.    Pulmonary asthma (mild, controlled) ,    Pulmonary exam normal breath sounds clear to auscultation       Cardiovascular negative cardio ROS Normal cardiovascular exam Rhythm:Regular Rate:Normal  ECG: SR, rate 68   Neuro/Psych Seizures -, Poorly Controlled,  Tremor Ataxia negative psych ROS   GI/Hepatic negative GI ROS, Neg liver ROS,   Endo/Other  negative endocrine ROS  Renal/GU negative Renal ROS     Musculoskeletal negative musculoskeletal ROS (+)   Abdominal   Peds  Hematology negative hematology ROS (+)   Anesthesia Other Findings MEERF syndrome  Reproductive/Obstetrics                            Anesthesia Physical  Anesthesia Plan  ASA: II  Anesthesia Plan: General   Post-op Pain Management:    Induction: Intravenous  PONV Risk Score and Plan: 4 or greater and Ondansetron, Dexamethasone and Treatment may vary due to age or medical condition  Airway Management Planned: Oral ETT  Additional Equipment:   Intra-op Plan:   Post-operative Plan: Extubation in OR  Informed Consent: I have reviewed the patients History and Physical, chart, labs and discussed the procedure including the risks, benefits and alternatives for the proposed anesthesia with the patient or authorized representative who has indicated his/her understanding and acceptance.   Dental advisory given  Plan Discussed with: CRNA  Anesthesia Plan Comments:       Anesthesia Quick Evaluation
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
## 2017-12-04 NOTE — Anesthesia Procedure Notes (Signed)
Procedure Name: LMA Insertion Date/Time: 12/04/2017 3:44 PM Performed by: Duane Boston, MD Pre-anesthesia Checklist: Patient identified, Emergency Drugs available, Suction available, Patient being monitored and Timeout performed Patient Re-evaluated:Patient Re-evaluated prior to induction Oxygen Delivery Method: Circle system utilized Preoxygenation: Pre-oxygenation with 100% oxygen Induction Type: IV induction Ventilation: Mask ventilation without difficulty LMA: LMA inserted LMA Size: 3.0 Number of attempts: 1 Placement Confirmation: positive ETCO2 and breath sounds checked- equal and bilateral Tube secured with: Tape Dental Injury: Teeth and Oropharynx as per pre-operative assessment

## 2017-12-04 NOTE — Transfer of Care (Signed)
Immediate Anesthesia Transfer of Care Note  Patient: Seirra Kos  Procedure(s) Performed: INSERTION PORT-A-CATH (N/A )  Patient Location: PACU  Anesthesia Type:General  Level of Consciousness: awake, oriented, pateint uncooperative, lethargic and responds to stimulation  Airway & Oxygen Therapy: Patient Spontanous Breathing and Patient connected to face mask oxygen  Post-op Assessment: Report given to RN, Post -op Vital signs reviewed and stable and Patient moving all extremities  Post vital signs: Reviewed and stable  Last Vitals:  Vitals:   12/04/17 1321 12/04/17 1723  BP: 125/81 121/76  Pulse: 78 100  Resp: 18   Temp: 36.7 C (!) 36.2 C  SpO2: 98%     Last Pain:  Vitals:   12/04/17 1321  TempSrc: Oral      Patients Stated Pain Goal: 3 (38/33/38 3291)  Complications: No apparent anesthesia complications

## 2017-12-04 NOTE — Progress Notes (Signed)
Pt has occasional twitching of entire body. She states "I'm having a lot of seizures".  Alert and oriented; asking for apple juice; speech is clear;  occasionally sleeping soundly. Vital signs have been stable. sats 100% on room air. Pt says she does not feel comfortable going home after "so many seizures". States "I'm concerned about my memory and medications". MD aware. Orders to keep overnight. Will report to night shift PACU RN.

## 2017-12-04 NOTE — Progress Notes (Addendum)
Twitching /jerking of body, states concerned to be going to new situation . Causes seizures to worsen. Asked for Ativan . Hospitalist   Fuller Plan MD At bedside. Wanted pt to have Ativan now given IV slowly

## 2017-12-04 NOTE — Op Note (Signed)
12/04/2017  5:03 PM  PATIENT:  [name], 26 y.o., female MRN: [id number] DOB: 23-Dec-1990  PREOP DIAGNOSIS:  Need of IV access, MEERF syndrome with recurrent seizures  POSTOP DIAGNOSIS:   Need of IV access, MEERF syndrome with recurrent seizures  PROCEDURE:   Procedure(s):  Ultrasound guided insertion of right IJ power port (Slim Implantable)  SURGEON:   Alphonsa Overall, M.D.  ANESTHESIA:   general  Anesthesiologist: Duane Boston, MD CRNA: Ofilia Neas, CRNA  General  EBL:  minimal  ml  COUNTS CORRECT:  YES  INDICATIONS FOR PROCEDURE:  Beonka Amesquita is a 26 y.o. (DOB: 08/12/1991) white female whose primary care physician is Nafziger, [name], NP and comes for power port placement.               The patient has MERRF syndrome with repeated seizures. She often needs IV access to control her seizures.  At times, she has required as many as 19 sticks to gain IV access.  So the port can be used to ease her IV access.   I placed a port in 08/28/2017.  This became infected.  I was not involved in the management because she was in [place].  But one concern was that the port was too big.  So this time I an using the [employer] power port.    The indications and risks of the surgery were explained to the patient.  The risks include, but are not limited to, infection, bleeding, pneumothorax, nerve injury, and thrombosis of the vein.  OPERATIVE NOTE:  The patient was taken to Room #2 at [facility].  Anesthesia was provided by Anesthesiologist: Duane Boston, MD CRNA: Ofilia Neas, CRNA.  At the beginning of the operation, the patient was given Cipro, had a roll placed under her back, and had the upper chest/neck prepped with Chloroprep and draped.   A time out was held and the surgery checklist reviewed.   The patient was placed in Trendelenburg position.  The right internal jugular vein was accessed with a 16 gauge needle using US guidance and a guide wire threaded through the  needle into the vein.  The position of the wire was checked with fluoroscopy.   I then developed a pocket in the upper inner aspect of the right chest for the port reservoir.  I used the [employer] ([employer]) kit for venous access.  The reservoir was sewn in place with a 3-0 Vicryl suture.  The reservoir had been flushed with dilute (10 units/cc) heparin.   I then passed the silastic tubing from the reservoir incision to the subclavian stick site and used the 6 French introducer to pass it into the vein.  The tip of the silastic catheter was position at the junction of the SVC and the right atrium under fluoroscopy.  The silastic catheter was then attached to the port with the bayonet device.     The entire port and tubing were checked with fluoroscopy and then the port was flushed with 4 cc of concentrated heparin (100 units/cc).   The wounds were then closed with 3-0 vicryl subcutaneous sutures and the skin closed with a 4-0 Monocryl suture.  The skin was then steristripped.  She has a shell fish allergy, so I did not use Tincture of Benzoin.   The patient was transferred to the recovery room in good condition.  The sponge and needle count were correct at the end of the case.  A CXR  is ordered for port placement and pending at the time of this note.  Alphonsa Overall, MD, [facility] Pager: [phone] Office phone:  [phone]

## 2017-12-04 NOTE — Progress Notes (Signed)
Pt seen in OR and recovery. Some tonic-clonic movements with eye deviation which seemed involuntary but no sympathetic signs. Pupils small. No incontinence. No pillerection Pt responded to voice in PACU. Will observe. Lillia Abed MD

## 2017-12-05 ENCOUNTER — Other Ambulatory Visit: Payer: Self-pay

## 2017-12-05 DIAGNOSIS — Z880 Allergy status to penicillin: Secondary | ICD-10-CM | POA: Diagnosis not present

## 2017-12-05 DIAGNOSIS — Z419 Encounter for procedure for purposes other than remedying health state, unspecified: Secondary | ICD-10-CM

## 2017-12-05 DIAGNOSIS — Z888 Allergy status to other drugs, medicaments and biological substances status: Secondary | ICD-10-CM | POA: Diagnosis not present

## 2017-12-05 DIAGNOSIS — Z95828 Presence of other vascular implants and grafts: Secondary | ICD-10-CM

## 2017-12-05 DIAGNOSIS — Z79899 Other long term (current) drug therapy: Secondary | ICD-10-CM | POA: Diagnosis not present

## 2017-12-05 DIAGNOSIS — E8842 MERRF syndrome: Secondary | ICD-10-CM | POA: Diagnosis not present

## 2017-12-05 DIAGNOSIS — E876 Hypokalemia: Secondary | ICD-10-CM

## 2017-12-05 DIAGNOSIS — R569 Unspecified convulsions: Secondary | ICD-10-CM | POA: Diagnosis not present

## 2017-12-05 LAB — BASIC METABOLIC PANEL
Anion gap: 8 (ref 5–15)
BUN: 6 mg/dL (ref 6–20)
CHLORIDE: 109 mmol/L (ref 101–111)
CO2: 22 mmol/L (ref 22–32)
Calcium: 8.3 mg/dL — ABNORMAL LOW (ref 8.9–10.3)
Creatinine, Ser: 0.74 mg/dL (ref 0.44–1.00)
GFR calc Af Amer: >60 mL/min (ref >60)
GFR calc non Af Amer: >60 mL/min (ref >60)
Glucose, Bld: 92 mg/dL (ref 65–99)
POTASSIUM: 3.3 mmol/L — AB (ref 3.5–5.1)
SODIUM: 139 mmol/L (ref 135–145)

## 2017-12-05 LAB — MAGNESIUM: MAGNESIUM: 1.9 mg/dL (ref 1.7–2.4)

## 2017-12-05 MED ORDER — POTASSIUM CHLORIDE CRYS ER 20 MEQ PO TBCR
40.0000 meq | EXTENDED_RELEASE_TABLET | Freq: Once | ORAL | Status: AC
  Administered 2017-12-05: 40 meq via ORAL
  Filled 2017-12-05: qty 2

## 2017-12-05 MED ORDER — IBUPROFEN 200 MG PO TABS
600.0000 mg | ORAL_TABLET | Freq: Four times a day (QID) | ORAL | Status: DC | PRN
  Administered 2017-12-05: 600 mg via ORAL
  Filled 2017-12-05: qty 3

## 2017-12-05 MED ORDER — LORAZEPAM 2 MG/ML IJ SOLN: 1.0000 mg | Freq: Once | INTRAMUSCULAR | Status: DC | PRN

## 2017-12-05 MED ORDER — DIPHENHYDRAMINE HCL 25 MG PO CAPS
25.0000 mg | ORAL_CAPSULE | Freq: Once | ORAL | Status: AC
  Administered 2017-12-06: 25 mg via ORAL
  Filled 2017-12-05: qty 1

## 2017-12-05 NOTE — Progress Notes (Signed)
Pt was notified by NT that she could not take a shower with fresh surgical incision....will need to take sponge bath.  Nurse came in and reinforced to the patient no shower until it is cleared by dr.  Nurse tech took pt in bathroom to wash up and patient got into shower anyway.

## 2017-12-05 NOTE — Progress Notes (Signed)
Patient states she felt well enough to go home. Message relayed to Dr. Lucia Gaskins and discharge placed. Patient then states an hour later she doesn't feel very well and her mom doesn't think she is safe to go home. Message relayed to Dr. Lucia Gaskins and he verbally stated to discontinue discharge and to reassess patient tomorrow.

## 2017-12-05 NOTE — Progress Notes (Addendum)
[place] [facility] Office:  [phone] General Surgery Progress Note   LOS: 0 days  POD -  1 Day Post-Op  Chief Complaint: Seizures  Assessment and Plan: 1.  INSERTION PORT-A-CATH, right IJ - 12/04/2017 - D. Jamarco Zaldivar  Need of IV access  This is okay 2.  Seizures  Seen with hospitalist last PM - getting back on her home meds.  She is waiting for a smoothie that her mother is bringing before taking her home meds  If she does well, will discharge around noon.  Hospitalist still to see this AM.  Mother just got here.  Addendum:  She still felt groggy.  Not ready to go home.  Will keep one more day.  3. 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 Hearing loss   Active Problems:   Seizures (HCC)  Subjective:  A little foggy this AM.  She says that she is seeing double a little.  But she got up and took a shower this AM.  Mother is bringing smoothie so the patient can take her meds.  Objective:   Vitals:   12/04/17 2116 12/05/17 0639  BP: (!) 112/53 114/61  Pulse: 86 (!) 105  Resp: 16 18  Temp: 98.5 F (36.9 C) 98.1 F (36.7 C)  SpO2: 97% 98%     Intake/Output from previous day:  12/21 0701 - 12/22 0700 In: 2510 [P.O.:460; I.V.:1800] Out: 0   Intake/Output this shift:  No intake/output data recorded.   Physical Exam:   General: Somewhat slurred speech who is alert.   HEENT: Normal. Pupils equal. .   Lungs: Clear   Wound: Right chest dressing damp - will change to new dressing     Lab Results:    Recent Labs    12/04/17 1326  WBC 5.4  HGB 13.4  HCT 40.6  PLT 352    BMET   Recent Labs    12/04/17 1326  NA 139  K 3.4*  CL 108  CO2 24  GLUCOSE 108*  BUN 8  CREATININE 0.60  CALCIUM 9.4    PT/INR  No results for input(s): LABPROT, INR in the last 72 hours.  ABG  No results for input(s):  PHART, HCO3 in the last 72 hours.  Invalid input(s): PCO2, PO2   Studies/Results:  Dg Chest Port 1 View  Result Date: 12/04/2017 CLINICAL DATA:  Status post Port-A-Cath placement. History of asthma. EXAM: PORTABLE CHEST 1 VIEW COMPARISON:  08/28/2017. FINDINGS: New Port-A-Cath has been inserted. Tip lies in the proximal RIGHT atrium. There is no pneumothorax. Lung fields are clear. Unchanged cardiomediastinal silhouette. IMPRESSION: Satisfactory new Port-A-Cath placement.  Tip in the RIGHT atrium. Electronically Signed   By: Staci Righter M.D.   On: 12/04/2017 17:41   Dg Fluoro Guide Cv Line-no Report  Result Date: 12/04/2017 Fluoroscopy was utilized by the requesting physician.  No radiographic interpretation.     Anti-infectives:   Anti-infectives (From admission, onward)   Start     Dose/Rate Route Frequency Ordered Stop   12/05/17 0600  ciprofloxacin (CIPRO) IVPB 400 mg     400 mg 200 mL/hr over 60 Minutes Intravenous On call to O.R. 12/04/17 1318 12/05/17 0610      Alphonsa Overall, MD, FACS Pager: [place] [facility] Office: [phone] 12/05/2017

## 2017-12-05 NOTE — Progress Notes (Signed)
Patient arrived to the floor at 2115 via stretcher from PACU.  At 2130 patient advised that she felt a sharp pain in her head and that she felt like she was about to have a seizure.  RN witnessed patient having an approx 30 second seizure.  Patient advised RN that she had a seizure.  The seizure was unwitnessed   At 74 RN witnessed patient having a seizure that lasted approx 1 minute.  RN notified provider on call, provider entered new order for PRN ativan   At 2345 RN witnessed patient having a seizure that lasted about 1 minute - RN gave patient PRN ativan at [phone]  RN will continue to monitor patient

## 2017-12-05 NOTE — Progress Notes (Addendum)
PROGRESS NOTE    [name]  QVZ:[id number] DOB: [birth year] DOA: 12/04/2017 PCP: Dorothyann Peng, NP   No chief complaint on file.   Brief Narrative:  Consulted for seizure  Assessment & Plan   MERRF disorder/ Seizure -Continue home medications -patient had missed her medications as she had planned surgery (port placement on 12/04/2017) -Discussed with the RN, patient supposedly is able to call her in the room when she is having a seizure? However these seizures have not been witnessed.  -Discussed with neurology, Dr. Cheral Marker, patient can have "shakes/clonus" while awake.  -follow up closely with outpatient neurology  -Addendum: spoke with patient at bedside (with mother), patient seemed to get her medications mixed up, along with dosages.  Compared what she stated to what was on the bottles as well as Dr. Amparo Bristol last office note, all were different.  -Attempted to reach Dr. Delice Lesch, however, not on call today.  -wonder if patient's symptoms are due to lack of sleep (which she admits not sleeping for days prior to her surgery due to anxiety) as well as possible anesthesia affect -of note, patient states she has an appointment with Dr. Delice Lesch on Dec 26.   Hypokalemia -pending repeat BMP -magnesium 1.9 -was given replacement  DVT Prophylaxis  SCDs  Code Status: full  Family Communication: Mother at bedside  Disposition Plan: Observation. Suspect patient can be discharged to home later today. Awaiting lab results. Would have patient follow up with Dr. Delice Lesch, her neurologist, as soon possible.   Consultants [facility] via phone  Procedures  None  Antibiotics   Anti-infectives (From admission, onward)   Start     Dose/Rate Route Frequency Ordered Stop   12/05/17 0600  ciprofloxacin (CIPRO) IVPB 400 mg     400 mg 200 mL/hr over 60 Minutes Intravenous On call to O.R. 12/04/17 1318 12/05/17 0610      Subjective:   [name] seen and examined today.  Feels  better today. Admits to having a seizure overnight and states she missed some of her meds due to surgery. Denies chest pain, shortness of breath, abdominal pain, N/V/D/C. Looking forward to getting a milkshake.   Objective:   Vitals:   12/04/17 2024 12/04/17 2116 12/05/17 0639 12/05/17 1020  BP: 119/76 (!) 112/53 114/61 121/79  Pulse: 69 86 (!) 105 86  Resp: 14 16 18 18   Temp:  98.5 F (36.9 C) 98.1 F (36.7 C)   TempSrc:  Oral Oral Oral  SpO2: 100% 97% 98% 98%  Weight:  45.6 kg (100 lb 8.5 oz)    Height:        Intake/Output Summary (Last 24 hours) at 12/05/2017 1102 Last data filed at 12/05/2017 3295 Gross per 24 hour  Intake 2510 ml  Output 0 ml  Net 2510 ml   Filed Weights   12/04/17 1321 12/04/17 2116  Weight: 49 kg (108 lb) 45.6 kg (100 lb 8.5 oz)    Exam  General: Well developed, well nourished, NAD, appears stated age  80: NCAT,mucous membranes moist.   Cardiovascular: S1 S2 auscultated, RRR, no murmurs  Respiratory: Clear to auscultation bilaterally with equal chest rise  Abdomen: Soft, nontender, nondistended, + bowel sounds  Extremities: warm dry without cyanosis clubbing or edema  Neuro: AAOx3, nonfocal  Psych: Apprpropraite   Data Reviewed: I have personally reviewed following labs and imaging studies  CBC: Recent Labs  Lab 12/04/17 1326  WBC 5.4  HGB 13.4  HCT 40.6  MCV 88.3  PLT 352  Basic Metabolic Panel: Recent Labs  Lab 12/04/17 1326  NA 139  K 3.4*  CL 108  CO2 24  GLUCOSE 108*  BUN 8  CREATININE 0.60  CALCIUM 9.4   GFR: Estimated Creatinine Clearance: 76 mL/min (by C-G formula based on SCr of 0.6 mg/dL). Liver Function Tests: No results for input(s): AST, ALT, ALKPHOS, BILITOT, PROT, ALBUMIN in the last 168 hours. No results for input(s): LIPASE, AMYLASE in the last 168 hours. No results for input(s): AMMONIA in the last 168 hours. Coagulation Profile: No results for input(s): INR, PROTIME in the last 168  hours. Cardiac Enzymes: No results for input(s): CKTOTAL, CKMB, CKMBINDEX, TROPONINI in the last 168 hours. BNP (last 3 results) No results for input(s): PROBNP in the last 8760 hours. HbA1C: No results for input(s): HGBA1C in the last 72 hours. CBG: No results for input(s): GLUCAP in the last 168 hours. Lipid Profile: No results for input(s): CHOL, HDL, LDLCALC, TRIG, CHOLHDL, LDLDIRECT in the last 72 hours. Thyroid Function Tests: No results for input(s): TSH, T4TOTAL, FREET4, T3FREE, THYROIDAB in the last 72 hours. Anemia Panel: No results for input(s): VITAMINB12, FOLATE, FERRITIN, TIBC, IRON, RETICCTPCT in the last 72 hours. Urine analysis:    Component Value Date/Time   COLORURINE STRAW (A) 06/27/2017 0701   APPEARANCEUR CLEAR 06/27/2017 0701   LABSPEC 1.005 06/27/2017 0701   PHURINE 8.0 06/27/2017 0701   GLUCOSEU NEGATIVE 06/27/2017 0701   HGBUR NEGATIVE 06/27/2017 0701   BILIRUBINUR NEGATIVE 06/27/2017 0701   KETONESUR NEGATIVE 06/27/2017 0701   PROTEINUR NEGATIVE 06/27/2017 0701   UROBILINOGEN 0.2 11/25/2011 2303   NITRITE NEGATIVE 06/27/2017 0701   LEUKOCYTESUR NEGATIVE 06/27/2017 0701   Sepsis Labs: @LABRCNTIP (procalcitonin:4,lacticidven:4)  )No results found for this or any previous visit (from the past 240 hour(s)).    Radiology Studies: Dg Chest Port 1 View  Result Date: 12/04/2017 CLINICAL DATA:  Status post Port-A-Cath placement. History of asthma. EXAM: PORTABLE CHEST 1 VIEW COMPARISON:  08/28/2017. FINDINGS: New Port-A-Cath has been inserted. Tip lies in the proximal RIGHT atrium. There is no pneumothorax. Lung fields are clear. Unchanged cardiomediastinal silhouette. IMPRESSION: Satisfactory new Port-A-Cath placement.  Tip in the RIGHT atrium. Electronically Signed   By: Staci Righter M.D.   On: 12/04/2017 17:41   Dg Fluoro Guide Cv Line-no Report  Result Date: 12/04/2017 Fluoroscopy was utilized by the requesting physician.  No radiographic  interpretation.     Scheduled Meds: . lacosamide  100 mg Oral BID  . lamoTRIgine  200 mg Oral BID  . levETIRAcetam  1,000 mg Oral Q breakfast   And  . levETIRAcetam  500 mg Oral BID  . Perampanel  8 mg Oral QHS  . potassium chloride  40 mEq Oral STAT   Continuous Infusions: . sodium chloride Stopped (12/05/17 0700)     LOS: 0 days   Time Spent in minutes   30 minutes  Essense Bousquet D.O. on 12/05/2017 at 11:02 AM  Between 7am to 7pm - Pager - [phone]  After 7pm go to [email or web address] - password [email or web address]  And look for the night coverage person covering for me after hours  [facility] Office  [phone]

## 2017-12-05 NOTE — Anesthesia Postprocedure Evaluation (Signed)
Anesthesia Post Note  Patient: [name]  Procedure(s) Performed: INSERTION PORT-A-CATH (N/A )     Patient location during evaluation: PACU Anesthesia Type: General Level of consciousness: awake and alert Pain management: pain level controlled Vital Signs Assessment: post-procedure vital signs reviewed and stable Respiratory status: spontaneous breathing, nonlabored ventilation, respiratory function stable and patient connected to nasal cannula oxygen Cardiovascular status: blood pressure returned to baseline and stable Postop Assessment: no apparent nausea or vomiting Anesthetic complications: no    Last Vitals:  Vitals:   12/04/17 2024 12/04/17 2116  BP: 119/76 (!) 112/53  Pulse: 69 86  Resp: 14 16  Temp:  36.9 C  SpO2: 100% 97%    Last Pain:  Vitals:   12/05/17 0353  TempSrc:   PainSc: [place] DAVID

## 2017-12-05 NOTE — Progress Notes (Signed)
Received in report that patient was advised she was not allowed to shower by both the RN and NT.   Upon assessment patient's port-a-cath dressing was clearly saturated.   Patient stated she took a shower prior to my arrival.   Dr. Lucia Gaskins rounded on patient during this time and clearly told her NOT to undress the dressing for 3 total days.   I verbally told Dr. Lucia Gaskins of patient taking a shower after being advised not to and asked if I should change the dressing.   Dr. Lucia Gaskins said to remove dressing, place sterile gauze and reapply tape.  Sterile gloves were used during the dressing change and I placed a mask on the patient and myself.  I told patient to turn her head to the left as to not contaminate the sterile field. Patient verbalized understanding that she was to not turn her head towards the affected site or touch the sight.   Patient repeatedly turned her head towards affected site to "look at it" and even touched the site with her finger.   I restated this was NOT allowed as she could contaminate the site and introduce infection straight to her blood stream. Patient apologized and states "I know I shouldn't have done that".  Per patient's request a photo was taken of the undressed site as she could view it without contaminating the site.   Hands were washed and new sterile gloves were placed before finishing the dressing.   Gauze and tape placed over dressing. I wrote in bold with a black marker to NOT remove the dressing until 12/08/2017 Christmas.   Patient verbalized understanding that she knew she should not remove dressing or touch affected site.   At shift change patient states the tape was irritating her skin. There were clear scratch marks under her right breast where tape concluded. The tape was peeling up at this area. More tape was reapplied over tape that was peeling up.   Oncoming RN assessed dressing during report.

## 2017-12-06 DIAGNOSIS — Z888 Allergy status to other drugs, medicaments and biological substances status: Secondary | ICD-10-CM | POA: Diagnosis not present

## 2017-12-06 DIAGNOSIS — Z880 Allergy status to penicillin: Secondary | ICD-10-CM | POA: Diagnosis not present

## 2017-12-06 DIAGNOSIS — Z79899 Other long term (current) drug therapy: Secondary | ICD-10-CM | POA: Diagnosis not present

## 2017-12-06 DIAGNOSIS — E8842 MERRF syndrome: Secondary | ICD-10-CM | POA: Diagnosis not present

## 2017-12-06 DIAGNOSIS — E876 Hypokalemia: Secondary | ICD-10-CM | POA: Diagnosis not present

## 2017-12-06 NOTE — Discharge Summary (Signed)
Physician Discharge Summary  Patient ID:  [name]  MRN: [id number]  DOB/AGE: 1990/12/22 26 y.o.  Admit date: 12/04/2017 Discharge date: 12/06/2017  Discharge Diagnoses:  1.  Seizures 2. 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 Hearing loss   Active Problems:   Seizures ([place])  Operation: Procedure(s): INSERTION PORT-A-CATH on 12/04/2017 - D. Lucia Gaskins  Discharged Condition: good  Hospital Course: [name] is an 26 y.o. female whose primary care physician is Dorothyann Peng, NP and who was admitted 12/04/2017 with a chief complaint of seizures/MERRF.   She was brought to the operating room on 12/04/2017 and underwent power port insertion.   After surgery, she had seizures, so she was admitted for observation.  I asked the hospitalist to see her with me to help manage her seizures.  She was better yesterday, but not ready to go home.  This AM she is better and ready to go home.  The discharge instructions were reviewed with the patient.  Consults: Hospitalist  Significant Diagnostic Studies: Results for orders placed or performed during the hospital encounter of 02/63/78  Basic metabolic panel  Result Value Ref Range   Sodium 139 135 - 145 mmol/L   Potassium 3.4 (L) 3.5 - 5.1 mmol/L   Chloride 108 101 - 111 mmol/L   CO2 24 22 - 32 mmol/L   Glucose, Bld 108 (H) 65 - 99 mg/dL   BUN 8 6 - 20 mg/dL   Creatinine, Ser 0.60 0.44 - 1.00 mg/dL   Calcium 9.4 8.9 - 10.3 mg/dL   GFR calc non Af Amer >60 >60 mL/min   GFR calc Af Amer >60 >60 mL/min   Anion gap 7 5 - 15  hCG, serum, qualitative  Result Value Ref Range   Preg, Serum NEGATIVE NEGATIVE  CBC  Result Value Ref Range   WBC 5.4 4.0 - 10.5 K/uL   RBC 4.60 3.87 - 5.11 MIL/uL   Hemoglobin 13.4 12.0 - 15.0 g/dL   HCT 40.6 36.0 - 46.0 %   MCV 88.3 78.0 - 100.0 fL   MCH 29.1 26.0 - 34.0 pg   MCHC 33.0 30.0 - 36.0 g/dL   RDW 13.1 11.5 - 15.5 %   Platelets 352 150 - 400 K/uL  Basic metabolic panel  Result Value Ref Range   Sodium 139 135 - 145 mmol/L   Potassium 3.3 (L) 3.5 - 5.1 mmol/L   Chloride 109 101 - 111 mmol/L   CO2 22 22 - 32 mmol/L   Glucose, Bld 92 65 - 99 mg/dL   BUN 6 6 - 20 mg/dL   Creatinine, Ser 0.74 0.44 - 1.00 mg/dL   Calcium 8.3 (L) 8.9 - 10.3 mg/dL   GFR calc non Af Amer >60 >60 mL/min   GFR calc Af Amer >60 >60 mL/min   Anion gap 8 5 - 15  Magnesium  Result Value Ref Range   Magnesium 1.9 1.7 - 2.4 mg/dL    Dg Chest Port 1 View  Result Date: 12/04/2017 CLINICAL DATA:  Status post Port-A-Cath placement. History of asthma. EXAM: PORTABLE CHEST 1 VIEW COMPARISON:  08/28/2017. FINDINGS: New Port-A-Cath has been inserted. Tip lies in the proximal RIGHT atrium. There is no pneumothorax. Lung fields are clear. Unchanged cardiomediastinal silhouette. IMPRESSION: Satisfactory new Port-A-Cath placement.  Tip in the RIGHT atrium. Electronically Signed   By: Roderic Ovens.D.  On: 12/04/2017 17:41   Dg Fluoro Guide Cv Line-no Report  Result Date: 12/04/2017 Fluoroscopy was utilized by the requesting physician.  No radiographic interpretation.    Discharge Exam:  Vitals:   12/05/17 2100 12/06/17 0448  BP: 101/61 (!) 101/45  Pulse: 73 (!) 54  Resp: 18 18  Temp: 98.2 F (36.8 C) 99.1 F (37.3 C)  SpO2: 97% 96%    General: WN thins WF who is alert and generally healthy appearing.  Lungs: Clear to auscultation and symmetric breath sounds. Right chest with dressing  Discharge Medications:   Allergies as of 12/06/2017      Reactions   Cefzil [cefprozil] Shortness Of Breath   Midazolam Nausea And Vomiting   Shellfish Allergy Hives   Cephalosporins Other (See Comments)   Unknown   Latex Rash   Penicillins Hives, Rash, Other (See Comments)   Has patient had a PCN reaction causing immediate rash, facial/tongue/throat  swelling, SOB or lightheadedness with hypotension: Yes Has patient had a PCN reaction causing severe rash involving mucus membranes or skin necrosis: No Has patient had a PCN reaction that required hospitalization: No Has patient had a PCN reaction occurring within the last 10 years: No If all of the above answers are "NO", then may proceed with Cephalosporin use.      Medication List    STOP taking these medications   traMADol 50 MG tablet Commonly known as:  ULTRAM     TAKE these medications   clindamycin-benzoyl peroxide gel Commonly known as:  BENZACLIN Apply 1 application topically 2 (two) times daily.   CoQ10 100 MG Caps Take 100 mg by mouth daily.   HYDROcodone-acetaminophen 5-325 MG tablet Commonly known as:  NORCO/VICODIN Take 1-2 tablets by mouth every 6 (six) hours as needed for moderate pain.   ibuprofen 200 MG tablet Commonly known as:  ADVIL,MOTRIN Take 800 mg by mouth every 8 (eight) hours as needed (for pain.).   KEPPRA 500 MG tablet Generic drug:  levETIRAcetam Take 2 tabs in AM, 1 tab at noon, 1 tab at night What changed:    how much to take  how to take this  when to take this  additional instructions   Lacosamide 100 MG Tabs Commonly known as:  VIMPAT Take 1 tablet twice a day What changed:    how much to take  how to take this  when to take this  additional instructions   lamoTRIgine 200 MG tablet Commonly known as:  LAMICTAL Take 200 mg by mouth 2 (two) times daily. What changed:  Another medication with the same name was removed. Continue taking this medication, and follow the directions you see here.   LORazepam 2 MG/ML injection Commonly known as:  ATIVAN Inject 2 mg into the muscle every 8 (eight) hours as needed (for seizure control). What changed:  Another medication with the same name was changed. Make sure you understand how and when to take each.   LORazepam 0.5 MG tablet Commonly known as:  ATIVAN Take 1 tablet (0.5  mg total) by mouth as needed for anxiety. What changed:  when to take this   Perampanel 4 MG Tabs Commonly known as:  FYCOMPA Take 2 tablets at night What changed:    how much to take  how to take this  when to take this  additional instructions       Disposition: 01-Home or Self Care  Discharge Instructions    Diet - low sodium heart healthy   Complete  by:  As directed    Diet - low sodium heart healthy   Complete by:  As directed    Diet - low sodium heart healthy   Complete by:  As directed    Increase activity slowly   Complete by:  As directed    Increase activity slowly   Complete by:  As directed    Increase activity slowly   Complete by:  As directed       Signed: Alphonsa Overall, M.D., [facility] Office:  [phone]  12/06/2017, 8:03 AM

## 2017-12-06 NOTE — Discharge Instructions (Signed)
[facility] [place] [facility] - DISCHARGE INSTRUCTIONS TO PATIENT  Activity:  Lifting - No lifting more than 15 pounds for 7 days, then no limit  Wound Care:   Leave bandage on until 12/25.  Then you may remove the bandage and shower.  The steristrips will curl up and you can cut off the edges.  Diet:  As tolerated.  Follow up appointment:  Call Dr. Pollie Friar office [facility]) at [phone] for an appointment in January or February.         Follow up with your neurologist as necessary.  Medications and dosages:  Resume your home medications.  You have a prescription for:  Vicodin  Call Dr. Lucia Gaskins or his office  [phone]) if you have:  Temperature greater than 100.4,  Persistent nausea and vomiting,  Severe uncontrolled pain,  Redness, tenderness, or signs of infection (pain, swelling, redness, odor or green/yellow discharge around the site),  Difficulty breathing, headache or visual disturbances,  Any other questions or concerns you may have after discharge.  In an emergency, call 911 or go to an Emergency Department at a nearby hospital.

## 2017-12-06 NOTE — Progress Notes (Signed)
Patient given discharge instructions, mother at bedside. All questions and concerns addressed.  Patient refused morning medications, stating they "make her dizzy" and would prefer to take them when she gets home.

## 2017-12-06 NOTE — Progress Notes (Signed)
Skin around porta cath dressing erythematous, itching, with clearly demarcated borders aligned with tape placement.  Areas under dressing covered with gauze only exhibit no rash. Surgical incision sites without redness.  Site redressed with gauze and Tegaderm dressing per MD recommendation.   Order given by Dr. Rosendo Gros to give single dose of benadryl also.

## 2017-12-07 ENCOUNTER — Encounter (HOSPITAL_COMMUNITY): Payer: Self-pay | Admitting: Surgery

## 2017-12-09 ENCOUNTER — Ambulatory Visit: Payer: BLUE CROSS/BLUE SHIELD | Admitting: Neurology

## 2017-12-09 VITALS — BP 120/66 | HR 66 | Ht 65.0 in | Wt 104.0 lb

## 2017-12-09 DIAGNOSIS — G40019 Localization-related (focal) (partial) idiopathic epilepsy and epileptic syndromes with seizures of localized onset, intractable, without status epilepticus: Secondary | ICD-10-CM | POA: Diagnosis not present

## 2017-12-09 DIAGNOSIS — G40309 Generalized idiopathic epilepsy and epileptic syndromes, not intractable, without status epilepticus: Secondary | ICD-10-CM | POA: Diagnosis not present

## 2017-12-09 DIAGNOSIS — E8842 MERRF syndrome: Secondary | ICD-10-CM

## 2017-12-09 MED ORDER — LACOSAMIDE 100 MG PO TABS: 100.0000 mg | ORAL_TABLET | Freq: Two times a day (BID) | ORAL | 5 refills | Status: DC

## 2017-12-09 MED ORDER — PERAMPANEL 4 MG PO TABS: ORAL_TABLET | ORAL | 5 refills | Status: DC

## 2017-12-09 MED ORDER — KEPPRA 500 MG PO TABS: ORAL_TABLET | ORAL | 6 refills | Status: DC
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
## 2017-12-09 NOTE — Patient Instructions (Signed)
1. Please call or email our office about the medications you have at home to confirm we are all on the same page, then we will start Lamictal taper from that 2. Continue Vimpat 100mg  twice a day, Fycompa 4mg  2 tabs at night, and Keppra 500mg  three times a day 3. Refer to [facility] for inpatient video EEG monitoring in July 4. Follow-up on your next visit to [facility]  Seizure Precautions: 1. If medication has been prescribed for you to prevent seizures, take it exactly as directed.  Do not stop taking the medicine without talking to your doctor first, even if you have not had a seizure in a long time.   2. Avoid activities in which a seizure would cause danger to yourself or to others.  Don't operate dangerous machinery, swim alone, or climb in high or dangerous places, such as on ladders, roofs, or girders.  Do not drive unless your doctor says you may.  3. If you have any warning that you may have a seizure, lay down in a safe place where you can't hurt yourself.    4.  No driving for 6 months from last seizure, as per [facility].   Please refer to the following link on the [place] website for more information: http://[email or web address].org/[email or web address]/driving/drivingu.cfm   5.  Maintain good sleep hygiene. Avoid alcohol.  6.  Notify your neurology if you are planning pregnancy or if you become pregnant.  7.  Contact your doctor if you have any problems that may be related to the medicine you are taking.  8.  Call 911 and bring the patient back to the ED if:        A.  The seizure lasts longer than 5 minutes.       B.  The patient doesn't awaken shortly after the seizure  C.  The patient has new problems such as difficulty seeing, speaking or moving  D.  The patient was injured during the seizure  E.  The patient has a temperature over 102 F (39C)  F.  The patient vomited and now is having trouble breathing

## 2017-12-16 ENCOUNTER — Telehealth: Payer: Self-pay | Admitting: Neurology

## 2017-12-16 ENCOUNTER — Encounter: Payer: Self-pay | Admitting: Neurology

## 2017-12-16 NOTE — Telephone Encounter (Signed)
Pt called and said the letter for school needs to faxed asap, please call and advise

## 2017-12-17 ENCOUNTER — Encounter: Payer: Self-pay | Admitting: Neurology

## 2017-12-17 NOTE — Telephone Encounter (Signed)
Note is done, thanks

## 2017-12-17 NOTE — Telephone Encounter (Signed)
Note faxed.

## 2017-12-17 NOTE — Telephone Encounter (Signed)
I have no idea what letter she is talking about...?  The letter written on 11/27 was faxed the same day.

## 2018-01-14 DIAGNOSIS — R200 Anesthesia of skin: Secondary | ICD-10-CM | POA: Diagnosis not present

## 2018-01-14 DIAGNOSIS — S43035A Inferior dislocation of left humerus, initial encounter: Secondary | ICD-10-CM | POA: Diagnosis not present

## 2018-01-14 DIAGNOSIS — Z91040 Latex allergy status: Secondary | ICD-10-CM | POA: Diagnosis not present

## 2018-01-14 DIAGNOSIS — R569 Unspecified convulsions: Secondary | ICD-10-CM | POA: Diagnosis not present

## 2018-01-14 DIAGNOSIS — S43015A Anterior dislocation of left humerus, initial encounter: Secondary | ICD-10-CM | POA: Diagnosis not present

## 2018-01-14 DIAGNOSIS — R202 Paresthesia of skin: Secondary | ICD-10-CM | POA: Diagnosis not present

## 2018-01-14 DIAGNOSIS — M25512 Pain in left shoulder: Secondary | ICD-10-CM | POA: Diagnosis not present

## 2018-01-14 DIAGNOSIS — M24412 Recurrent dislocation, left shoulder: Secondary | ICD-10-CM | POA: Diagnosis not present

## 2018-01-16 DIAGNOSIS — X58XXXA Exposure to other specified factors, initial encounter: Secondary | ICD-10-CM | POA: Diagnosis not present

## 2018-01-16 DIAGNOSIS — S43012A Anterior subluxation of left humerus, initial encounter: Secondary | ICD-10-CM | POA: Diagnosis not present

## 2018-01-16 DIAGNOSIS — S43015A Anterior dislocation of left humerus, initial encounter: Secondary | ICD-10-CM | POA: Diagnosis not present

## 2018-01-16 DIAGNOSIS — M79602 Pain in left arm: Secondary | ICD-10-CM | POA: Diagnosis not present

## 2018-01-16 DIAGNOSIS — S43005A Unspecified dislocation of left shoulder joint, initial encounter: Secondary | ICD-10-CM | POA: Diagnosis not present

## 2018-01-16 DIAGNOSIS — S43035A Inferior dislocation of left humerus, initial encounter: Secondary | ICD-10-CM | POA: Diagnosis not present

## 2018-01-16 DIAGNOSIS — Z91040 Latex allergy status: Secondary | ICD-10-CM | POA: Diagnosis not present

## 2018-01-16 DIAGNOSIS — G40909 Epilepsy, unspecified, not intractable, without status epilepticus: Secondary | ICD-10-CM | POA: Diagnosis not present

## 2018-01-16 DIAGNOSIS — R569 Unspecified convulsions: Secondary | ICD-10-CM | POA: Diagnosis not present

## 2018-01-16 DIAGNOSIS — S4992XA Unspecified injury of left shoulder and upper arm, initial encounter: Secondary | ICD-10-CM | POA: Diagnosis not present

## 2018-02-12 HISTORY — PX: IR SCLEROTHERAPY OF A FLUID COLLECTION: IMG6090

## 2018-02-27 ENCOUNTER — Other Ambulatory Visit: Payer: Self-pay | Admitting: Surgery

## 2018-03-03 ENCOUNTER — Telehealth: Payer: Self-pay | Admitting: Neurology

## 2018-03-03 ENCOUNTER — Ambulatory Visit: Payer: BLUE CROSS/BLUE SHIELD | Admitting: Clinical

## 2018-03-03 NOTE — Telephone Encounter (Signed)
Patient is having some issues with her medication. She states that she is on about 7 different medications and not sure which one is causing the issues when I asked the name of the medication. She is having sick feeling to her stomach and very sleepy and dizzy feeling please call

## 2018-03-03 NOTE — Telephone Encounter (Signed)
Spoke with pt.  She states that she has N/V/lack of appetite.  States that these symptoms usually start about an hour to an hour and a half after taking her AM medications.  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 Delice Lesch and call pt back with any recommendations.  Pt states that she is scheduled to come in on 4/11 - I have moved that appointment to 3/27 @ 11:30AM.

## 2018-03-04 ENCOUNTER — Encounter (HOSPITAL_COMMUNITY): Payer: Self-pay

## 2018-03-05 DIAGNOSIS — H903 Sensorineural hearing loss, bilateral: Secondary | ICD-10-CM | POA: Diagnosis not present

## 2018-03-08 ENCOUNTER — Ambulatory Visit (HOSPITAL_COMMUNITY)
Admission: RE | Admit: 2018-03-08 | Discharge: 2018-03-08 | Disposition: A | Discharge status: Home / Self Care | Payer: BLUE CROSS/BLUE SHIELD | Source: Ambulatory Visit | Attending: Surgery | Admitting: Surgery

## 2018-03-08 ENCOUNTER — Ambulatory Visit (INDEPENDENT_AMBULATORY_CARE_PROVIDER_SITE_OTHER): Payer: Self-pay | Admitting: *Deleted

## 2018-03-08 DIAGNOSIS — Z95828 Presence of other vascular implants and grafts: Secondary | ICD-10-CM | POA: Insufficient documentation

## 2018-03-08 DIAGNOSIS — Z452 Encounter for adjustment and management of vascular access device: Secondary | ICD-10-CM | POA: Diagnosis not present

## 2018-03-08 DIAGNOSIS — I781 Nevus, non-neoplastic: Secondary | ICD-10-CM

## 2018-03-08 MED ORDER — SODIUM CHLORIDE 0.9% FLUSH
10.0000 mL | Freq: Once | INTRAVENOUS | Status: AC
  Administered 2018-03-08: 10 mL via INTRAVENOUS

## 2018-03-08 NOTE — Discharge Instructions (Signed)
Port-a-cath flushed.  °

## 2018-03-08 NOTE — Progress Notes (Signed)
PATIENT CARE CENTER NOTE  Diagnosis: Difficult intravenous access   Provider: Dr. Keturah Barre. Newman   Procedure: Port-a-cath flush   Note: Patient's PAC accessed, flush with Normal Saline and deaccessed. Patient tolerated procedure well. Discharge instructions given to patient. Patient alert, oriented and ambulatory at discharge.

## 2018-03-08 NOTE — Progress Notes (Signed)
X=.3% Sotradecol administered with a 27g butterfly.  Patient received a total of 3cc.  Payient's mother insisted her 2 visible veins be injected. Easy access. Tol well. Follow prn.   Photos: Yes.    Compression stockings applied: Yes.

## 2018-03-10 ENCOUNTER — Ambulatory Visit: Payer: Self-pay | Admitting: Neurology

## 2018-03-25 ENCOUNTER — Ambulatory Visit: Payer: Self-pay | Admitting: Neurology

## 2018-04-01 ENCOUNTER — Ambulatory Visit: Payer: BLUE CROSS/BLUE SHIELD | Admitting: Clinical

## 2018-04-01 DIAGNOSIS — F840 Autistic disorder: Secondary | ICD-10-CM

## 2018-05-05 ENCOUNTER — Ambulatory Visit: Payer: BLUE CROSS/BLUE SHIELD | Admitting: Clinical

## 2018-05-05 DIAGNOSIS — F840 Autistic disorder: Secondary | ICD-10-CM

## 2018-05-07 ENCOUNTER — Other Ambulatory Visit: Payer: Self-pay | Admitting: Surgery

## 2018-05-11 ENCOUNTER — Telehealth: Payer: Self-pay | Admitting: Neurology

## 2018-05-11 ENCOUNTER — Ambulatory Visit: Payer: BLUE CROSS/BLUE SHIELD | Admitting: Clinical

## 2018-05-11 NOTE — Telephone Encounter (Signed)
Patient called and would like to ask you a couple of questions. Please Call. Thanks

## 2018-05-12 NOTE — Telephone Encounter (Signed)
Patient wants to talk to someone about possible side effects from the medication she is on. She said she was on several when I asked her what the name of the medication was

## 2018-05-12 NOTE — Telephone Encounter (Signed)
Spoke with pt.  She states that she has had some passing out spells recently - 2 times in the past month / 5 times since February.  States that right before coming home from school she was taken to the hospital for syncope. Her heart rate was 49 and she was "diagnosed" with bradycardia.  She requested referral to cardiologist. She also stated that she wasn't sure if Dr. Delice Lesch would want to see her over the summer or not.  I advised that she should see Dr. Delice Lesch at least once every 6-12 months for medication management.  Pt scheduled for Friday, May 31 @ 8:30am.  Pt went on to state that her seizure frequency has gone down "so the medications must be doing something".  Dr. Delice Lesch - Juluis Rainier

## 2018-05-13 ENCOUNTER — Ambulatory Visit: Payer: BLUE CROSS/BLUE SHIELD | Admitting: Clinical

## 2018-05-13 ENCOUNTER — Encounter: Payer: Self-pay | Admitting: Adult Health

## 2018-05-13 ENCOUNTER — Ambulatory Visit: Payer: BLUE CROSS/BLUE SHIELD | Admitting: Adult Health

## 2018-05-13 ENCOUNTER — Ambulatory Visit (HOSPITAL_COMMUNITY)
Admission: RE | Admit: 2018-05-13 | Discharge: 2018-05-13 | Disposition: A | Discharge status: Home / Self Care | Payer: BLUE CROSS/BLUE SHIELD | Source: Ambulatory Visit | Attending: Surgery | Admitting: Surgery

## 2018-05-13 VITALS — BP 108/70 | HR 67 | Temp 98.9°F

## 2018-05-13 DIAGNOSIS — J45909 Unspecified asthma, uncomplicated: Secondary | ICD-10-CM | POA: Insufficient documentation

## 2018-05-13 DIAGNOSIS — G43909 Migraine, unspecified, not intractable, without status migrainosus: Secondary | ICD-10-CM | POA: Insufficient documentation

## 2018-05-13 DIAGNOSIS — R55 Syncope and collapse: Secondary | ICD-10-CM | POA: Diagnosis not present

## 2018-05-13 DIAGNOSIS — Z79899 Other long term (current) drug therapy: Secondary | ICD-10-CM | POA: Insufficient documentation

## 2018-05-13 MED ORDER — HEPARIN SOD (PORK) LOCK FLUSH 100 UNIT/ML IV SOLN
500.0000 [IU] | Freq: Once | INTRAVENOUS | Status: AC
  Administered 2018-05-13: 500 [IU] via INTRAVENOUS
  Filled 2018-05-13: qty 5

## 2018-05-13 NOTE — Progress Notes (Signed)
Pt arrived for port access flush; port flushed per protocol; no complications noted
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
## 2018-05-14 ENCOUNTER — Other Ambulatory Visit: Payer: Self-pay

## 2018-05-14 ENCOUNTER — Ambulatory Visit: Payer: BLUE CROSS/BLUE SHIELD | Admitting: Neurology

## 2018-05-14 ENCOUNTER — Encounter: Payer: Self-pay | Admitting: Neurology

## 2018-05-14 VITALS — BP 106/66 | HR 59 | Ht 64.0 in | Wt 103.0 lb

## 2018-05-14 DIAGNOSIS — E8842 MERRF syndrome: Secondary | ICD-10-CM

## 2018-05-14 DIAGNOSIS — G40019 Localization-related (focal) (partial) idiopathic epilepsy and epileptic syndromes with seizures of localized onset, intractable, without status epilepticus: Secondary | ICD-10-CM

## 2018-05-14 DIAGNOSIS — G40309 Generalized idiopathic epilepsy and epileptic syndromes, not intractable, without status epilepticus: Secondary | ICD-10-CM | POA: Diagnosis not present

## 2018-05-14 MED ORDER — LACOSAMIDE 100 MG PO TABS: 100.0000 mg | ORAL_TABLET | Freq: Two times a day (BID) | ORAL | 5 refills | Status: DC

## 2018-05-14 MED ORDER — PERAMPANEL 4 MG PO TABS: ORAL_TABLET | ORAL | 5 refills | Status: DC

## 2018-05-14 NOTE — Patient Instructions (Signed)
1. Continue all your medications 2. Refer to [facility] for inpatient video EEG monitoring while you are still in [place] 3. Proceed with Cardiology evaluation 4. Follow-up after EEG  Seizure Precautions: 1. If medication has been prescribed for you to prevent seizures, take it exactly as directed.  Do not stop taking the medicine without talking to your doctor first, even if you have not had a seizure in a long time.   2. Avoid activities in which a seizure would cause danger to yourself or to others.  Don't operate dangerous machinery, swim alone, or climb in high or dangerous places, such as on ladders, roofs, or girders.  Do not drive unless your doctor says you may.  3. If you have any warning that you may have a seizure, lay down in a safe place where you can't hurt yourself.    4.  No driving for 6 months from last seizure, as per [facility].   Please refer to the following link on the [place] website for more information: http://[email or web address]/[email or web address]/[email or web address]   5.  Maintain good sleep hygiene. Avoid alcohol.  6.  Notify your neurology if you are planning pregnancy or if you become pregnant.  7.  Contact your doctor if you have any problems that may be related to the medicine you are taking.  8.  Call 911 and bring the patient back to the ED if:        A.  The seizure lasts longer than 5 minutes.       B.  The patient doesn't awaken shortly after the seizure  C.  The patient has new problems such as difficulty seeing, speaking or moving  D.  The patient was injured during the seizure  E.  The patient has a temperature over 102 F (39C)  F.  The patient vomited and now is having trouble breathing
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
## 2018-05-21 ENCOUNTER — Encounter: Payer: Self-pay | Admitting: Cardiology

## 2018-05-21 ENCOUNTER — Ambulatory Visit: Payer: BLUE CROSS/BLUE SHIELD | Admitting: Cardiology

## 2018-05-21 VITALS — BP 126/78 | HR 66 | Ht 64.0 in | Wt 103.0 lb

## 2018-05-21 DIAGNOSIS — R55 Syncope and collapse: Secondary | ICD-10-CM | POA: Diagnosis not present

## 2018-05-21 NOTE — Patient Instructions (Signed)
Medication Instructions:  Your physician recommends that you continue on your current medications as directed. Please refer to the Current Medication list given to you today.  Labwork: None  Testing/Procedures: You had an EKG today.  Your physician has requested that you have an echocardiogram. Echocardiography is a painless test that uses sound waves to create images of your heart. It provides your doctor with information about the size and shape of your heart and how well your heart's chambers and valves are working. This procedure takes approximately one hour. There are no restrictions for this procedure.  Your physician has recommended that you wear an event monitor. Event monitors are medical devices that record the heart's electrical activity. Doctors most often [place] these monitors to diagnose arrhythmias. Arrhythmias are problems with the speed or rhythm of the heartbeat. The monitor is a small, portable device. You can wear one while you do your normal daily activities. This is usually used to diagnose what is causing palpitations/syncope (passing out). 30 days.  Follow-Up: Your physician wants you to follow-up in: 3 months in [place]. You will receive a reminder letter in the mail two months in advance. If you don't receive a letter, please call our office to schedule the follow-up appointment.  Any Other Special Instructions Will Be Listed Below (If Applicable).     If you need a refill on your cardiac medications before your next appointment, please call your pharmacy.
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
## 2018-05-26 ENCOUNTER — Other Ambulatory Visit (HOSPITAL_COMMUNITY): Payer: Self-pay

## 2018-06-15 ENCOUNTER — Other Ambulatory Visit (HOSPITAL_BASED_OUTPATIENT_CLINIC_OR_DEPARTMENT_OTHER): Payer: Self-pay

## 2018-07-14 ENCOUNTER — Ambulatory Visit (HOSPITAL_COMMUNITY)
Admission: RE | Admit: 2018-07-14 | Discharge: 2018-07-14 | Disposition: A | Discharge status: Home / Self Care | Payer: BLUE CROSS/BLUE SHIELD | Source: Ambulatory Visit | Attending: Surgery | Admitting: Surgery

## 2018-07-14 DIAGNOSIS — Z452 Encounter for adjustment and management of vascular access device: Secondary | ICD-10-CM | POA: Insufficient documentation

## 2018-07-14 MED ORDER — SODIUM CHLORIDE 0.9% FLUSH
10.0000 mL | INTRAVENOUS | Status: AC | PRN
  Administered 2018-07-14: 10 mL

## 2018-07-14 NOTE — Progress Notes (Signed)
PATIENT CARE CENTER NOTE   Provider: Dr. Lucia Gaskins   Procedure: Port-a-cath flush   Note: Patient's PAC accessed and flushed with 0.9% Sodium Chloride. Sterile procedure followed. Patient tolerated procedure well. Discharge information given. Patient alert, oriented and ambulatory at discharge.

## 2018-07-14 NOTE — Discharge Instructions (Signed)
PAC flushed

## 2018-08-12 DIAGNOSIS — L218 Other seborrheic dermatitis: Secondary | ICD-10-CM | POA: Diagnosis not present

## 2018-09-10 ENCOUNTER — Encounter (HOSPITAL_COMMUNITY): Payer: Self-pay

## 2018-09-13 ENCOUNTER — Ambulatory Visit (HOSPITAL_COMMUNITY)
Admission: RE | Admit: 2018-09-13 | Discharge: 2018-09-13 | Disposition: A | Discharge status: Home / Self Care | Payer: BLUE CROSS/BLUE SHIELD | Source: Ambulatory Visit | Attending: Surgery | Admitting: Surgery

## 2018-09-13 DIAGNOSIS — Z452 Encounter for adjustment and management of vascular access device: Secondary | ICD-10-CM | POA: Diagnosis not present

## 2018-09-13 MED ORDER — SODIUM CHLORIDE 0.9% FLUSH
10.0000 mL | INTRAVENOUS | Status: AC | PRN
  Administered 2018-09-13: 10 mL

## 2018-09-13 NOTE — Discharge Instructions (Signed)
Port-a-cath flush with 0.9% Sodium Chloride.

## 2018-09-13 NOTE — Progress Notes (Signed)
PATIENT CARE CENTER NOTE   Provider: Dr. Lucia Gaskins   Procedure: Port-a-cath flush   Note: Patient's PAC accessed and flushed with 0.9% Sodium Chloride. Sterile procedure followed. Patient tolerated procedure well. Discharge information given. Patient alert, oriented and ambulatory at discharge.

## 2018-09-14 DIAGNOSIS — S43006A Unspecified dislocation of unspecified shoulder joint, initial encounter: Secondary | ICD-10-CM

## 2018-09-14 HISTORY — DX: Unspecified dislocation of unspecified shoulder joint, initial encounter: S43.006A

## 2018-09-20 ENCOUNTER — Emergency Department (HOSPITAL_COMMUNITY): Payer: BLUE CROSS/BLUE SHIELD

## 2018-09-20 ENCOUNTER — Other Ambulatory Visit: Payer: Self-pay

## 2018-09-20 ENCOUNTER — Encounter (HOSPITAL_COMMUNITY): Payer: Self-pay | Admitting: Emergency Medicine

## 2018-09-20 ENCOUNTER — Emergency Department (HOSPITAL_COMMUNITY)
Admission: EM | Admit: 2018-09-20 | Discharge: 2018-09-20 | Disposition: A | Discharge status: Home / Self Care | Payer: BLUE CROSS/BLUE SHIELD | Attending: Emergency Medicine | Admitting: Emergency Medicine

## 2018-09-20 DIAGNOSIS — Z79899 Other long term (current) drug therapy: Secondary | ICD-10-CM | POA: Insufficient documentation

## 2018-09-20 DIAGNOSIS — K8020 Calculus of gallbladder without cholecystitis without obstruction: Secondary | ICD-10-CM | POA: Diagnosis not present

## 2018-09-20 DIAGNOSIS — S43305A Dislocation of unspecified parts of left shoulder girdle, initial encounter: Secondary | ICD-10-CM | POA: Insufficient documentation

## 2018-09-20 DIAGNOSIS — Y929 Unspecified place or not applicable: Secondary | ICD-10-CM | POA: Diagnosis not present

## 2018-09-20 DIAGNOSIS — J45909 Unspecified asthma, uncomplicated: Secondary | ICD-10-CM | POA: Insufficient documentation

## 2018-09-20 DIAGNOSIS — K839 Disease of biliary tract, unspecified: Secondary | ICD-10-CM | POA: Insufficient documentation

## 2018-09-20 DIAGNOSIS — S43015A Anterior dislocation of left humerus, initial encounter: Secondary | ICD-10-CM | POA: Diagnosis not present

## 2018-09-20 DIAGNOSIS — G40019 Localization-related (focal) (partial) idiopathic epilepsy and epileptic syndromes with seizures of localized onset, intractable, without status epilepticus: Secondary | ICD-10-CM | POA: Diagnosis not present

## 2018-09-20 DIAGNOSIS — K828 Other specified diseases of gallbladder: Secondary | ICD-10-CM | POA: Diagnosis not present

## 2018-09-20 DIAGNOSIS — S4992XA Unspecified injury of left shoulder and upper arm, initial encounter: Secondary | ICD-10-CM

## 2018-09-20 DIAGNOSIS — Y939 Activity, unspecified: Secondary | ICD-10-CM | POA: Insufficient documentation

## 2018-09-20 DIAGNOSIS — M25312 Other instability, left shoulder: Secondary | ICD-10-CM | POA: Diagnosis not present

## 2018-09-20 DIAGNOSIS — Y999 Unspecified external cause status: Secondary | ICD-10-CM | POA: Diagnosis not present

## 2018-09-20 DIAGNOSIS — W19XXXA Unspecified fall, initial encounter: Secondary | ICD-10-CM | POA: Insufficient documentation

## 2018-09-20 DIAGNOSIS — S40912A Unspecified superficial injury of left shoulder, initial encounter: Secondary | ICD-10-CM | POA: Diagnosis not present

## 2018-09-20 DIAGNOSIS — G40909 Epilepsy, unspecified, not intractable, without status epilepticus: Secondary | ICD-10-CM | POA: Diagnosis not present

## 2018-09-20 LAB — COMPREHENSIVE METABOLIC PANEL
ALT: 12 U/L (ref 0–44)
AST: 16 U/L (ref 15–41)
Albumin: 3.7 g/dL (ref 3.5–5.0)
Alkaline Phosphatase: 51 U/L (ref 38–126)
Anion gap: 5 (ref 5–15)
BUN: 11 mg/dL (ref 6–20)
CO2: 24 mmol/L (ref 22–32)
Calcium: 8.5 mg/dL — ABNORMAL LOW (ref 8.9–10.3)
Chloride: 106 mmol/L (ref 98–111)
Creatinine, Ser: 0.57 mg/dL (ref 0.44–1.00)
GFR calc Af Amer: >60 mL/min (ref >60)
GFR calc non Af Amer: >60 mL/min (ref >60)
Glucose, Bld: 91 mg/dL (ref 70–99)
Potassium: 3.7 mmol/L (ref 3.5–5.1)
Sodium: 135 mmol/L (ref 135–145)
Total Bilirubin: 0.5 mg/dL (ref 0.3–1.2)
Total Protein: 6 g/dL — ABNORMAL LOW (ref 6.5–8.1)

## 2018-09-20 LAB — CBC
HCT: 36.6 % (ref 36.0–46.0)
Hemoglobin: 12.2 g/dL (ref 12.0–15.0)
MCH: 29.5 pg (ref 26.0–34.0)
MCHC: 33.3 g/dL (ref 30.0–36.0)
MCV: 88.6 fL (ref 78.0–100.0)
PLATELETS: 280 10*3/uL (ref 150–400)
RBC: 4.13 MIL/uL (ref 3.87–5.11)
RDW: 11.9 % (ref 11.5–15.5)
WBC: 7.4 10*3/uL (ref 4.0–10.5)

## 2018-09-20 LAB — I-STAT BETA HCG BLOOD, ED (MC, WL, AP ONLY): I-stat hCG, quantitative: <5 m[IU]/mL (ref <5)

## 2018-09-20 LAB — LIPASE, BLOOD: Lipase: 27 U/L (ref 11–51)

## 2018-09-20 MED ORDER — OXYCODONE-ACETAMINOPHEN 5-325 MG PO TABS
1.0000 | ORAL_TABLET | ORAL | Status: DC | PRN
  Administered 2018-09-20: 1 via ORAL
  Filled 2018-09-20: qty 1

## 2018-09-20 MED ORDER — HEPARIN SOD (PORK) LOCK FLUSH 100 UNIT/ML IV SOLN
500.0000 [IU] | Freq: Once | INTRAVENOUS | Status: DC
  Filled 2018-09-20: qty 5

## 2018-09-20 NOTE — ED Notes (Signed)
Patient transported to X-ray 

## 2018-09-20 NOTE — ED Notes (Signed)
Pt reports she had a seizure Friday and was waiting to go ortho today for Xr with hx of shoulder dislocation to left. Ortho told pt to come to ED for relocation with sedation and possible surgery.
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
## 2018-09-20 NOTE — ED Notes (Signed)
UA collected from Pt in case one is needed.   At this time, Pt is complaining of nausea. Pt is visibly diaphoretic and flushed. Pt denies cardiac history. Temp was taken and was normal at 98.31F.

## 2018-09-20 NOTE — ED Triage Notes (Signed)
Pt reports she had a seizure and fell and hurt her L shoulder on Friday. Pt has hx seizures. Pt takes Lamictal and Keppra. Pt went to orthpedic doctor due to pain to L shoulder, was sent here due to XR showed dislocated L shoulder.

## 2018-09-21 ENCOUNTER — Telehealth: Payer: Self-pay | Admitting: Neurology

## 2018-09-21 NOTE — Telephone Encounter (Signed)
Pls let her know I reviewed the records, follow-up with Surgery and Ortho as they recommended. I also see that Mina Marble has contacted her a few times about doing the inpatient video EEG study, once settled with gallbladder, pls have her schedule the inpatient stay. Thanks

## 2018-09-21 NOTE — Telephone Encounter (Signed)
Spoke with pt relaying message below.  Pt would like to call back for telephone number to [facility].  She will call after her Sx.

## 2018-09-21 NOTE — Telephone Encounter (Signed)
Patient called and was told by the ER to let Dr. Delice Lesch know she is to have her Gallbladder removed. She was seen in the ER last night. Thanks

## 2018-09-27 ENCOUNTER — Other Ambulatory Visit: Payer: Self-pay | Admitting: Surgery

## 2018-09-27 DIAGNOSIS — G40309 Generalized idiopathic epilepsy and epileptic syndromes, not intractable, without status epilepticus: Secondary | ICD-10-CM | POA: Diagnosis not present

## 2018-09-27 DIAGNOSIS — K829 Disease of gallbladder, unspecified: Secondary | ICD-10-CM | POA: Diagnosis not present

## 2018-09-27 DIAGNOSIS — Z789 Other specified health status: Secondary | ICD-10-CM | POA: Diagnosis not present

## 2018-09-27 DIAGNOSIS — E8842 MERRF syndrome: Secondary | ICD-10-CM | POA: Diagnosis not present

## 2018-09-29 ENCOUNTER — Encounter (HOSPITAL_COMMUNITY): Payer: Self-pay

## 2018-09-29 NOTE — Pre-Procedure Instructions (Signed)
The following are in epic: Last office note Dr. Geraldo Pitter 05/21/2018 EKG 05/21/2018 CBC, CMP 09/20/2018

## 2018-09-29 NOTE — Patient Instructions (Signed)
Your procedure is scheduled on: Tuesday, Oct. 22, 2019   Surgery Time:  7:30AM-9:00AM   Report to [facility]  Entrance    Report to admitting at 5:30 AM   Call this number if you have problems the morning of surgery [phone]   Do not eat food:After Midnight.   May have liquids until 4:30AM morning of surgery      CLEAR LIQUID DIET   Foods Allowed                                                                     Foods Excluded  Coffee and tea, regular and decaf                             liquids that you cannot  Plain Jell-O in any flavor                                             see through such as: Fruit ices (not with fruit pulp)                                     milk, soups, orange juice  Iced Popsicles                                    All solid food Carbonated beverages, regular and diet                                    Cranberry, grape and apple juices Sports drinks like Gatorade Lightly seasoned clear broth or consume(fat free) Sugar, honey syrup  Sample Menu Breakfast                                Lunch                                     Supper Cranberry juice                    Beef broth                            Chicken broth Jell-O                                     Grape juice                           Apple juice Coffee or tea  Jell-O                                      Popsicle                                                Coffee or tea                        Coffee or tea    Brush your teeth the morning of surgery.   Do NOT smoke after Midnight   Complete one Ensure drink the morning of surgery by 4:30AM the day of surgery.   Take these medicines the morning of surgery with A SIP OF WATER: Keppra, Vimpat                               You may not have any metal on your body including hair pins, jewelry, and body piercings             Do not wear make-up, lotions, powders, perfumes/cologne,  or deodorant             Do not wear nail polish.  Do not shave  48 hours prior to surgery.               Do not bring valuables to the hospital. [place].   Contacts, dentures or bridgework may not be worn into surgery.   Leave suitcase in the car. After surgery it may be brought to your room.    Special Instructions: Bring a copy of your healthcare power of attorney and living will documents         the day of surgery if you haven't scanned them in before.              Please read over the following fact sheets you were given:  [facility] - Preparing for Surgery Before surgery, you can play an important role.  Because skin is not sterile, your skin needs to be as free of germs as possible.  You can reduce the number of germs on your skin by washing with CHG (chlorahexidine gluconate) soap before surgery.  CHG is an antiseptic cleaner which kills germs and bonds with the skin to continue killing germs even after washing. Please DO NOT use if you have an allergy to CHG or antibacterial soaps.  If your skin becomes reddened/irritated stop using the CHG and inform your nurse when you arrive at Short Stay. Do not shave (including legs and underarms) for at least 48 hours prior to the first CHG shower.  You may shave your face/neck.  Please follow these instructions carefully:  1.  Shower with CHG Soap the night before surgery and the  morning of surgery.  2.  If you choose to wash your hair, wash your hair first as usual with your normal  shampoo.  3.  After you shampoo, rinse your hair and body thoroughly to remove the shampoo.  4.  Use CHG as you would any other liquid soap.  You can apply chg directly to the skin and wash.  Gently with a scrungie or clean washcloth.  5.  Apply the CHG Soap to your body ONLY FROM THE NECK DOWN.   Do   not use on face/ open                           Wound or open sores. Avoid contact  with eyes, ears mouth and   genitals (private parts).                       Wash face,  Genitals (private parts) with your normal soap.             6.  Wash thoroughly, paying special attention to the area where your    surgery  will be performed.  7.  Thoroughly rinse your body with warm water from the neck down.  8.  DO NOT shower/wash with your normal soap after using and rinsing off the CHG Soap.                9.  Pat yourself dry with a clean towel.            10.  Wear clean pajamas.            11.  Place clean sheets on your bed the night of your first shower and do not  sleep with pets. Day of Surgery : Do not apply any lotions/deodorants the morning of surgery.  Please wear clean clothes to the hospital/surgery center.  FAILURE TO FOLLOW THESE INSTRUCTIONS MAY RESULT IN THE CANCELLATION OF YOUR SURGERY  PATIENT SIGNATURE_________________________________  NURSE SIGNATURE__________________________________  ________________________________________________________________________

## 2018-09-30 ENCOUNTER — Encounter (HOSPITAL_COMMUNITY): Payer: Self-pay

## 2018-09-30 ENCOUNTER — Encounter (HOSPITAL_COMMUNITY)
Admission: RE | Admit: 2018-09-30 | Discharge: 2018-09-30 | Disposition: A | Discharge status: Home / Self Care | Payer: BLUE CROSS/BLUE SHIELD | Source: Ambulatory Visit | Attending: Surgery | Admitting: Surgery

## 2018-09-30 ENCOUNTER — Other Ambulatory Visit: Payer: Self-pay

## 2018-09-30 DIAGNOSIS — Z01818 Encounter for other preprocedural examination: Secondary | ICD-10-CM | POA: Diagnosis not present

## 2018-09-30 HISTORY — DX: Pneumonia, unspecified organism: J18.9

## 2018-09-30 HISTORY — DX: Unspecified dislocation of unspecified shoulder joint, initial encounter: S43.006A

## 2018-09-30 HISTORY — DX: Unspecified ovarian cyst, unspecified side: N83.209

## 2018-09-30 HISTORY — DX: Personal history of other diseases of the circulatory system: Z86.79

## 2018-09-30 HISTORY — DX: Gastro-esophageal reflux disease without esophagitis: K21.9

## 2018-09-30 HISTORY — DX: Syncope and collapse: R55

## 2018-09-30 HISTORY — DX: Generalized idiopathic epilepsy and epileptic syndromes, not intractable, without status epilepticus: G40.309

## 2018-09-30 HISTORY — DX: Calculus of gallbladder without cholecystitis without obstruction: K80.20

## 2018-09-30 HISTORY — DX: MERRF syndrome: E88.42

## 2018-10-01 ENCOUNTER — Telehealth: Payer: Self-pay | Admitting: Cardiology

## 2018-10-01 NOTE — Progress Notes (Signed)
[facility] and talked to Abigail Butts in Triage and informed her that per Anesthesia, Dr. Adele Barthel, patient needed to have ECHO before having surgery on 10/05/2018. Abigail Butts stated she would call the patient and work on getting her in to have an ECHO and have Cardiac Clearance.

## 2018-10-01 NOTE — Telephone Encounter (Signed)
Patient called and is to have gallbladder surgery Tuesday 10/05/2018 in the early AM. She needs an echo done for surgery clearance. Nevin Bloodgood scheduled her an Echo at [facility] at 10:30 Monday, 10/04/2018.  Patient was told told to call the office after 3pm Monday 10/04/2018 to discuss results and determine surgery clearance

## 2018-10-01 NOTE — Progress Notes (Signed)
Consulted Dr. Adele Barthel, MDA face to face about note from Dr. Geraldo Pitter from 05/21/2018 for patient needing an Echo. Patient never had an ECHO done and wanted to know if she still needed an ECHO done before her surgery with Dr. Lucia Gaskins on 10/05/2018. Per Dr. Adele Barthel, patient needs to have ECHO done as requested in note by Dr. Geraldo Pitter from 05/21/2018.

## 2018-10-04 ENCOUNTER — Encounter: Payer: Self-pay | Admitting: *Deleted

## 2018-10-04 ENCOUNTER — Ambulatory Visit (HOSPITAL_COMMUNITY): Payer: BLUE CROSS/BLUE SHIELD | Attending: Cardiology

## 2018-10-04 ENCOUNTER — Other Ambulatory Visit: Payer: Self-pay

## 2018-10-04 ENCOUNTER — Telehealth: Payer: Self-pay | Admitting: Cardiology

## 2018-10-04 DIAGNOSIS — R55 Syncope and collapse: Secondary | ICD-10-CM | POA: Diagnosis not present

## 2018-10-04 NOTE — Telephone Encounter (Signed)
New Message   Pt calling to check on the results or her echo, states is was suppose to be stat due to her having her gallbladder surgery being tomorrow and the doctor doing the procedure would need the results by 3:00 or 4:00 today. Please call

## 2018-10-04 NOTE — Telephone Encounter (Signed)
Pt needs surgical clearance by 3:30 today so they won't cancel her surgery. Echo  Results are in her chart.

## 2018-10-04 NOTE — H&P (Signed)
[name]  Location: [facility] Surgery Patient #: [id number] DOB: 09-Jul-1991 Single / Language: Cleophus Molt / Race: White Female  History of Present Illness   The patient is a 27 year old female who presents with a complaint of need of iv access.  The PCP is Dorothyann Peng, NP  The patient was referred by Dr. Ellouise Newer. She came by herself.  Theresia Lo is in the car with her mother. Theresia Lo had surgery to remove a fatty tumor.  She is home for about 2 months from [place]. She has had a lot of stomach problems last several months. She says it was she eats a meal too big she'll vomit. She'll sometimes feel feverish after eating a meal. She has felt bloated after food and her bowel movements have been off. Maceo Pro food seems to exacerbate this more, though she doesn't eat much fried or greasy food. It sounds like she saw a doctor in [place] who did an [place] and said that she had sludge in her gall bladder. She does not have a report for any encounter in [place]. She was seen here in the [facility] ER for a dislocated shoulder. They also evaluated her for some abdominal pain, though reading the notes - the primary reason for the ER visit was ortho. She had an [place] on 09/20/2018 which showed sludge in the gall bladder.  I discussed with the patient the indications and risks of gall bladder surgery. The primary risks of gall bladder surgery include, but are not limited to, bleeding, infection, common bile duct injury, and open surgery. There is also the risk that the patient may have continued symptoms after surgery. We discussed the typical post-operative recovery course. I tried to answer the patient's questions. I gave the patient literature about gall bladder surgery. I told Hennie that this was not entirely straight forward and there is a chance that something else is causing her GI symptoms besides gall bladder disease. I talked about GI referral and HIDA  scan, but she was interested in going ahead with the gall bladder surgery first.  Plan: 1) lap chole with IOC   History of need for IV access: The patient has MERRF syndrome with repeated seizures. She often needs IV access to control her seizures. One time she says she was stuck as many as 19 times to find a vein. She was seen Dr. Brett Fairy for neurology, but has switched to Dr. Ellouise Newer. She is currently getting some medication adjusted to try to better control her seizures. She is currently in school in [place] getting a PhD in [place]. She got her undergraduate degree at [employer] and her masters in [place]. With her seizures, she's had multiple dislocations of her shoulder. She has active dislocation of her left shoulder right now. She has seen Dr. Domingo Cocking at [facility] and Dr. Lynann Bologna in [place]. Apparently because her seizures are so poorly controlled, Dr. Domingo Cocking has been hesitant to operate on her left shoulder. She takes oral medication for her seizures, who requires IV Ativan when she has complex partial seizures, in hopes of not going into full seizures.  Past Medical History: 1. Seizures 2. 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 Lap appendectomy - [place] - 2012 5. Hearing loss 6. Power port placed - 12/04/2018 - D. [employer]  Social History: Unmarried. Has dog, Theresia Lo (for 6 years) Lives with Mom on [address].  Does not drive. Lives with roommates in [place]. 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  Note, I took care of her grandmother, Domenick Gong.   Allergies Malachi Bonds, CMA; 09/27/2018 4:01 PM) Shellfish  Anaphylaxis. Cefuroxime Axetil *CEPHALOSPORINS*  Midazolam HCl *HYPNOTICS/SEDATIVES/SLEEP  DISORDER AGENTS*  Nausea, Vomiting. Versed *HYPNOTICS/SEDATIVES/SLEEP DISORDER AGENTS*   Medication History Malachi Bonds, CMA; 09/27/2018 4:01 PM) Saline Bacteriostatic (0.9% Solution, 10 Injection as directed, Taken starting 09/08/2018) Active. (Please flush port-a-cath per protocol.) Aczone (7.5% Gel, External) Active. Co Q 10 (10MG  Capsule, Oral) Active. Ibuprofen (200MG  Capsule, Oral) Active. Vimpat (50MG  Tablet, Oral) Active. LaMICtal (200MG  Tablet, Oral) Active. Keppra (500MG  Tablet, Oral) Active. Ativan (2MG /ML Solution, Injection) Active. Ativan (0.5MG  Tablet, Oral) Active. Medications Reconciled  Vitals (Chemira Jones CMA; 09/27/2018 4:01 PM) 09/27/2018 4:01 PM Weight: 110 lb Height: 64in Body Surface Area: 1.52 m Body Mass Index: 18.88 kg/m  Temp.: 98.7F(Oral)  BP: 120/80 (Sitting, Left Arm, Standard)   Physical Exam  General: Thin WF alert and generally healthy appearing. She speaks with a lisp. HEENT: Normal. Pupils equal.  Neck: Supple. No mass. No thyroid mass.  Lymph Nodes: No supraclavicular or cervical nodes.  Chest: port in right upper chest looks good  Lungs: Clear Heart: RRR  Abdomen: Soft. No mass or localized tenderness   Assessment & Plan  1.  GALL BLADDER DISEASE (K82.9)  Plan:   1) Lap chole with IOC   (Because of her history of seizures - probable overnight observation)  2.  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 Lindey Renzulli   2nd power port - 12/06/2017 - D. Anallely Rosell 3.  Seizures 4.  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 Hearing loss 7.  Echo requested by Dr. Geraldo Pitter (05/21/2018)/Dr. Roanna Banning - shows normal EF and valves   Alphonsa Overall, MD, [facility] Pager:  [phone] Office phone:  [phone]

## 2018-10-04 NOTE — Anesthesia Preprocedure Evaluation (Addendum)
Anesthesia Evaluation  Patient identified by MRN, date of birth, ID band Patient awake    Reviewed: Allergy & Precautions, NPO status , Patient's Chart, lab work & pertinent test results  History of Anesthesia Complications (+) PONV  Airway Mallampati: III  TM Distance: >3 FB Neck ROM: Full    Dental no notable dental hx. (+) Teeth Intact, Dental Advisory Given   Pulmonary asthma ,    Pulmonary exam normal breath sounds clear to auscultation       Cardiovascular negative cardio ROS Normal cardiovascular exam Rhythm:Regular Rate:Normal  TTE 09/2018 EF 60-65%, no valvular abnormalities   Neuro/Psych  Headaches, Seizures - (last grand mal 08/2018), Well Controlled,  PSYCHIATRIC DISORDERS Anxiety nyoclonus epilepsy and ragged red fibers    GI/Hepatic Neg liver ROS, GERD  ,  Endo/Other  negative endocrine ROS  Renal/GU negative Renal ROS  negative genitourinary   Musculoskeletal negative musculoskeletal ROS (+)   Abdominal   Peds  Hematology negative hematology ROS (+)   Anesthesia Other Findings Gall bladder disease, h/o seizures after anesthesia 2018  Reproductive/Obstetrics                           Anesthesia Physical Anesthesia Plan  ASA: III  Anesthesia Plan: General   Post-op Pain Management:    Induction: Intravenous  PONV Risk Score and Plan: 4 or greater and Ondansetron, Dexamethasone and Treatment may vary due to age or medical condition  Airway Management Planned: Oral ETT  Additional Equipment:   Intra-op Plan:   Post-operative Plan: Extubation in OR  Informed Consent: I have reviewed the patients History and Physical, chart, labs and discussed the procedure including the risks, benefits and alternatives for the proposed anesthesia with the patient or authorized representative who has indicated his/her understanding and acceptance.   Dental advisory given  Plan  Discussed with: CRNA  Anesthesia Plan Comments:        Anesthesia Quick Evaluation

## 2018-10-04 NOTE — Telephone Encounter (Signed)
Spoke with patient and informed her of echocardiogram results. Advised her that Dr. Bettina Gavia would review her chart to determine if she is cleared for surgery. Patient requested me to call Abigail Butts at [facility] to update them regarding cardiac clearance. Spoke with Abigail Butts and she faxed over a clearance form for Dr. Bettina Gavia to sign.   Needed documentation for cardiac clearance and echocardiogram have been faxed to attention Abigail Butts at [facility] as requested. Patient updated. No further questions.

## 2018-10-05 ENCOUNTER — Ambulatory Visit (HOSPITAL_COMMUNITY): Payer: BLUE CROSS/BLUE SHIELD

## 2018-10-05 ENCOUNTER — Encounter (HOSPITAL_COMMUNITY)
Admission: RE | Disposition: A | Discharge status: Home / Self Care | Payer: Self-pay | Source: Ambulatory Visit | Attending: Surgery

## 2018-10-05 ENCOUNTER — Observation Stay (HOSPITAL_COMMUNITY)
Admission: RE | Admit: 2018-10-05 | Discharge: 2018-10-06 | Disposition: A | Discharge status: Home / Self Care | Payer: BLUE CROSS/BLUE SHIELD | Source: Ambulatory Visit | Attending: Internal Medicine | Admitting: Internal Medicine

## 2018-10-05 ENCOUNTER — Ambulatory Visit (HOSPITAL_COMMUNITY): Payer: BLUE CROSS/BLUE SHIELD | Admitting: Anesthesiology

## 2018-10-05 ENCOUNTER — Encounter (HOSPITAL_COMMUNITY): Payer: Self-pay | Admitting: *Deleted

## 2018-10-05 DIAGNOSIS — Z9049 Acquired absence of other specified parts of digestive tract: Secondary | ICD-10-CM | POA: Diagnosis not present

## 2018-10-05 DIAGNOSIS — K811 Chronic cholecystitis: Principal | ICD-10-CM | POA: Insufficient documentation

## 2018-10-05 DIAGNOSIS — E8842 MERRF syndrome: Secondary | ICD-10-CM | POA: Diagnosis not present

## 2018-10-05 DIAGNOSIS — Z79899 Other long term (current) drug therapy: Secondary | ICD-10-CM | POA: Insufficient documentation

## 2018-10-05 DIAGNOSIS — K828 Other specified diseases of gallbladder: Secondary | ICD-10-CM | POA: Diagnosis not present

## 2018-10-05 DIAGNOSIS — Z881 Allergy status to other antibiotic agents status: Secondary | ICD-10-CM | POA: Insufficient documentation

## 2018-10-05 DIAGNOSIS — Z95828 Presence of other vascular implants and grafts: Secondary | ICD-10-CM | POA: Insufficient documentation

## 2018-10-05 DIAGNOSIS — J45909 Unspecified asthma, uncomplicated: Secondary | ICD-10-CM | POA: Insufficient documentation

## 2018-10-05 DIAGNOSIS — K829 Disease of gallbladder, unspecified: Secondary | ICD-10-CM | POA: Diagnosis present

## 2018-10-05 DIAGNOSIS — Z91040 Latex allergy status: Secondary | ICD-10-CM | POA: Diagnosis not present

## 2018-10-05 DIAGNOSIS — Z888 Allergy status to other drugs, medicaments and biological substances status: Secondary | ICD-10-CM | POA: Diagnosis not present

## 2018-10-05 DIAGNOSIS — Z91013 Allergy to seafood: Secondary | ICD-10-CM | POA: Insufficient documentation

## 2018-10-05 DIAGNOSIS — Z880 Allergy status to penicillin: Secondary | ICD-10-CM | POA: Insufficient documentation

## 2018-10-05 DIAGNOSIS — Z8249 Family history of ischemic heart disease and other diseases of the circulatory system: Secondary | ICD-10-CM | POA: Insufficient documentation

## 2018-10-05 DIAGNOSIS — R569 Unspecified convulsions: Secondary | ICD-10-CM

## 2018-10-05 DIAGNOSIS — Z8241 Family history of sudden cardiac death: Secondary | ICD-10-CM | POA: Diagnosis not present

## 2018-10-05 DIAGNOSIS — K219 Gastro-esophageal reflux disease without esophagitis: Secondary | ICD-10-CM | POA: Diagnosis not present

## 2018-10-05 DIAGNOSIS — H9190 Unspecified hearing loss, unspecified ear: Secondary | ICD-10-CM | POA: Insufficient documentation

## 2018-10-05 DIAGNOSIS — F419 Anxiety disorder, unspecified: Secondary | ICD-10-CM | POA: Diagnosis not present

## 2018-10-05 DIAGNOSIS — M24412 Recurrent dislocation, left shoulder: Secondary | ICD-10-CM | POA: Insufficient documentation

## 2018-10-05 DIAGNOSIS — G40309 Generalized idiopathic epilepsy and epileptic syndromes, not intractable, without status epilepticus: Secondary | ICD-10-CM | POA: Insufficient documentation

## 2018-10-05 DIAGNOSIS — K915 Postcholecystectomy syndrome: Secondary | ICD-10-CM | POA: Diagnosis not present

## 2018-10-05 HISTORY — PX: CHOLECYSTECTOMY: SHX55

## 2018-10-05 LAB — HCG, SERUM, QUALITATIVE: Preg, Serum: NEGATIVE

## 2018-10-05 SURGERY — LAPAROSCOPIC CHOLECYSTECTOMY WITH INTRAOPERATIVE CHOLANGIOGRAM
Anesthesia: General | Site: Abdomen

## 2018-10-05 MED ORDER — MIDAZOLAM HCL 5 MG/5ML IJ SOLN
INTRAMUSCULAR | Status: DC | PRN
  Administered 2018-10-05: 2 mg via INTRAVENOUS

## 2018-10-05 MED ORDER — LEVETIRACETAM IN NACL 500 MG/100ML IV SOLN: 500.0000 mg | Freq: Three times a day (TID) | INTRAVENOUS | Status: DC

## 2018-10-05 MED ORDER — LEVETIRACETAM 500 MG PO TABS
500.0000 mg | ORAL_TABLET | Freq: Three times a day (TID) | ORAL | Status: DC
  Administered 2018-10-05 – 2018-10-06 (×2): 500 mg via ORAL
  Filled 2018-10-05 (×2): qty 1

## 2018-10-05 MED ORDER — LACOSAMIDE 50 MG PO TABS: 100.0000 mg | ORAL_TABLET | Freq: Two times a day (BID) | ORAL | Status: DC

## 2018-10-05 MED ORDER — IBUPROFEN 800 MG PO TABS
800.0000 mg | ORAL_TABLET | Freq: Three times a day (TID) | ORAL | Status: DC | PRN
  Administered 2018-10-05: 800 mg via ORAL
  Filled 2018-10-05: qty 1

## 2018-10-05 MED ORDER — ACETAMINOPHEN 325 MG PO TABS: 650.0000 mg | ORAL_TABLET | Freq: Four times a day (QID) | ORAL | Status: DC | PRN

## 2018-10-05 MED ORDER — HYDROMORPHONE HCL 1 MG/ML IJ SOLN
INTRAMUSCULAR | Status: AC
  Filled 2018-10-05: qty 2

## 2018-10-05 MED ORDER — HYDROCODONE-ACETAMINOPHEN 5-325 MG PO TABS
1.0000 | ORAL_TABLET | ORAL | Status: DC | PRN
  Administered 2018-10-05 (×2): 1 via ORAL
  Filled 2018-10-05 (×2): qty 1

## 2018-10-05 MED ORDER — ROCURONIUM BROMIDE 100 MG/10ML IV SOLN
INTRAVENOUS | Status: AC
  Filled 2018-10-05: qty 1

## 2018-10-05 MED ORDER — LACOSAMIDE 50 MG PO TABS
100.0000 mg | ORAL_TABLET | Freq: Two times a day (BID) | ORAL | Status: DC
  Administered 2018-10-05 – 2018-10-06 (×2): 100 mg via ORAL
  Filled 2018-10-05 (×2): qty 2

## 2018-10-05 MED ORDER — LACOSAMIDE 50 MG PO TABS
100.0000 mg | ORAL_TABLET | Freq: Two times a day (BID) | ORAL | Status: DC
  Filled 2018-10-05: qty 2

## 2018-10-05 MED ORDER — MIDAZOLAM HCL 2 MG/2ML IJ SOLN
INTRAMUSCULAR | Status: AC
  Filled 2018-10-05: qty 2

## 2018-10-05 MED ORDER — LORAZEPAM 0.5 MG PO TABS: 0.5000 mg | ORAL_TABLET | Freq: Three times a day (TID) | ORAL | Status: DC | PRN

## 2018-10-05 MED ORDER — LEVETIRACETAM IN NACL 1000 MG/100ML IV SOLN
1000.0000 mg | Freq: Once | INTRAVENOUS | Status: AC | PRN
  Administered 2018-10-05: 1000 mg via INTRAVENOUS
  Filled 2018-10-05: qty 100

## 2018-10-05 MED ORDER — HYDROMORPHONE HCL 1 MG/ML IJ SOLN
0.2500 mg | INTRAMUSCULAR | Status: DC | PRN
  Administered 2018-10-05: 0.5 mg via INTRAVENOUS

## 2018-10-05 MED ORDER — ONDANSETRON HCL 4 MG/2ML IJ SOLN: 4.0000 mg | Freq: Four times a day (QID) | INTRAMUSCULAR | Status: DC | PRN

## 2018-10-05 MED ORDER — LORAZEPAM 2 MG/ML IJ SOLN
4.0000 mg | Freq: Once | INTRAMUSCULAR | Status: AC
  Administered 2018-10-05: 4 mg via INTRAVENOUS
  Filled 2018-10-05: qty 2

## 2018-10-05 MED ORDER — POTASSIUM CHLORIDE IN NACL 20-0.45 MEQ/L-% IV SOLN
INTRAVENOUS | Status: DC
  Administered 2018-10-05: 17:00:00 via INTRAVENOUS
  Filled 2018-10-05 (×2): qty 1000

## 2018-10-05 MED ORDER — LEVETIRACETAM IN NACL 500 MG/100ML IV SOLN
500.0000 mg | Freq: Once | INTRAVENOUS | Status: DC
  Administered 2018-10-05: 500 mg via INTRAVENOUS
  Filled 2018-10-05: qty 100

## 2018-10-05 MED ORDER — CHLORHEXIDINE GLUCONATE CLOTH 2 % EX PADS: 6.0000 | MEDICATED_PAD | Freq: Once | CUTANEOUS | Status: DC

## 2018-10-05 MED ORDER — SODIUM CHLORIDE 0.9 % IV SOLN
1000.0000 mg | Freq: Once | INTRAVENOUS | Status: AC
  Administered 2018-10-05: 500 mg via INTRAVENOUS
  Filled 2018-10-05: qty 20

## 2018-10-05 MED ORDER — KETAMINE HCL 10 MG/ML IJ SOLN
INTRAMUSCULAR | Status: AC
  Filled 2018-10-05: qty 1

## 2018-10-05 MED ORDER — LACTATED RINGERS IV SOLN
INTRAVENOUS | Status: DC
  Administered 2018-10-05 (×2): via INTRAVENOUS

## 2018-10-05 MED ORDER — SUCCINYLCHOLINE CHLORIDE 200 MG/10ML IV SOSY
PREFILLED_SYRINGE | INTRAVENOUS | Status: AC
  Filled 2018-10-05: qty 10

## 2018-10-05 MED ORDER — LORAZEPAM 2 MG/ML IJ SOLN
2.0000 mg | Freq: Once | INTRAMUSCULAR | Status: AC
  Administered 2018-10-05: 2 mg via INTRAVENOUS
  Filled 2018-10-05: qty 1

## 2018-10-05 MED ORDER — LAMOTRIGINE 100 MG PO TABS
200.0000 mg | ORAL_TABLET | Freq: Every day | ORAL | Status: DC
  Administered 2018-10-05: 200 mg via ORAL
  Filled 2018-10-05: qty 2

## 2018-10-05 MED ORDER — ACETAMINOPHEN 500 MG PO TABS
1000.0000 mg | ORAL_TABLET | ORAL | Status: AC
  Administered 2018-10-05: 1000 mg via ORAL
  Filled 2018-10-05: qty 2

## 2018-10-05 MED ORDER — LORAZEPAM 2 MG/ML IJ SOLN
2.0000 mg | Freq: Once | INTRAMUSCULAR | Status: DC
  Filled 2018-10-05: qty 1

## 2018-10-05 MED ORDER — LEVETIRACETAM IN NACL 1000 MG/100ML IV SOLN
1000.0000 mg | Freq: Two times a day (BID) | INTRAVENOUS | Status: DC
  Administered 2018-10-05: 1000 mg via INTRAVENOUS
  Filled 2018-10-05 (×3): qty 100

## 2018-10-05 MED ORDER — ROCURONIUM BROMIDE 50 MG/5ML IV SOSY
PREFILLED_SYRINGE | INTRAVENOUS | Status: DC | PRN
  Administered 2018-10-05: 40 mg via INTRAVENOUS
  Administered 2018-10-05: 10 mg via INTRAVENOUS

## 2018-10-05 MED ORDER — LORAZEPAM 2 MG/ML IJ SOLN
4.0000 mg | Freq: Once | INTRAMUSCULAR | Status: AC
  Administered 2018-10-05: 2 mg via INTRAVENOUS
  Filled 2018-10-05: qty 2

## 2018-10-05 MED ORDER — 0.9 % SODIUM CHLORIDE (POUR BTL) OPTIME
TOPICAL | Status: DC | PRN
  Administered 2018-10-05: 1000 mL

## 2018-10-05 MED ORDER — PROPOFOL 10 MG/ML IV BOLUS
INTRAVENOUS | Status: AC
  Filled 2018-10-05: qty 40

## 2018-10-05 MED ORDER — PROPOFOL 10 MG/ML IV BOLUS
INTRAVENOUS | Status: AC
  Filled 2018-10-05: qty 20

## 2018-10-05 MED ORDER — IOPAMIDOL (ISOVUE-300) INJECTION 61%
INTRAVENOUS | Status: AC
  Filled 2018-10-05: qty 50

## 2018-10-05 MED ORDER — FENTANYL CITRATE (PF) 250 MCG/5ML IJ SOLN
INTRAMUSCULAR | Status: AC
  Filled 2018-10-05: qty 5

## 2018-10-05 MED ORDER — LORAZEPAM 2 MG/ML IJ SOLN: 2.0000 mg | Freq: Once | INTRAMUSCULAR | Status: DC

## 2018-10-05 MED ORDER — LIDOCAINE HCL 2 % IJ SOLN
INTRAMUSCULAR | Status: AC
  Filled 2018-10-05: qty 20

## 2018-10-05 MED ORDER — DEXAMETHASONE SODIUM PHOSPHATE 10 MG/ML IJ SOLN
INTRAMUSCULAR | Status: DC | PRN
  Administered 2018-10-05: 5 mg via INTRAVENOUS

## 2018-10-05 MED ORDER — LEVETIRACETAM 500 MG PO TABS
500.0000 mg | ORAL_TABLET | Freq: Three times a day (TID) | ORAL | Status: DC
  Administered 2018-10-05: 500 mg via ORAL
  Filled 2018-10-05: qty 1

## 2018-10-05 MED ORDER — PROPOFOL 500 MG/50ML IV EMUL
INTRAVENOUS | Status: DC | PRN
  Administered 2018-10-05: 25 ug/kg/min via INTRAVENOUS

## 2018-10-05 MED ORDER — LORAZEPAM BOLUS VIA INFUSION: 2.0000 mg | Freq: Once | INTRAVENOUS | Status: DC

## 2018-10-05 MED ORDER — SUGAMMADEX SODIUM 200 MG/2ML IV SOLN
INTRAVENOUS | Status: DC | PRN
  Administered 2018-10-05: 100 mg via INTRAVENOUS

## 2018-10-05 MED ORDER — ONDANSETRON HCL 4 MG/2ML IJ SOLN
INTRAMUSCULAR | Status: AC
  Filled 2018-10-05: qty 2

## 2018-10-05 MED ORDER — LIDOCAINE 2% (20 MG/ML) 5 ML SYRINGE
INTRAMUSCULAR | Status: DC | PRN
  Administered 2018-10-05: 1 mg/kg/h via INTRAVENOUS

## 2018-10-05 MED ORDER — DEXAMETHASONE SODIUM PHOSPHATE 10 MG/ML IJ SOLN
INTRAMUSCULAR | Status: AC
  Filled 2018-10-05: qty 1

## 2018-10-05 MED ORDER — BUPIVACAINE HCL (PF) 0.25 % IJ SOLN
INTRAMUSCULAR | Status: AC
  Filled 2018-10-05: qty 30

## 2018-10-05 MED ORDER — LACTATED RINGERS IR SOLN
Status: DC | PRN
  Administered 2018-10-05: 1000 mL

## 2018-10-05 MED ORDER — SODIUM CHLORIDE 0.9 % IV SOLN: 1000.0000 mg | Freq: Once | INTRAVENOUS | Status: DC

## 2018-10-05 MED ORDER — LEVETIRACETAM IN NACL 500 MG/100ML IV SOLN
500.0000 mg | Freq: Two times a day (BID) | INTRAVENOUS | Status: DC
  Filled 2018-10-05 (×3): qty 100

## 2018-10-05 MED ORDER — ONDANSETRON HCL 4 MG/2ML IJ SOLN
INTRAMUSCULAR | Status: DC | PRN
  Administered 2018-10-05: 4 mg via INTRAVENOUS

## 2018-10-05 MED ORDER — SUGAMMADEX SODIUM 200 MG/2ML IV SOLN
INTRAVENOUS | Status: AC
  Filled 2018-10-05: qty 2

## 2018-10-05 MED ORDER — MORPHINE SULFATE (PF) 2 MG/ML IV SOLN
1.0000 mg | INTRAVENOUS | Status: DC | PRN
  Administered 2018-10-05: 1 mg via INTRAVENOUS
  Administered 2018-10-06 (×2): 2 mg via INTRAVENOUS
  Filled 2018-10-05 (×3): qty 1

## 2018-10-05 MED ORDER — BUPIVACAINE HCL (PF) 0.25 % IJ SOLN
INTRAMUSCULAR | Status: DC | PRN
  Administered 2018-10-05: 30 mL

## 2018-10-05 MED ORDER — LIDOCAINE 2% (20 MG/ML) 5 ML SYRINGE
INTRAMUSCULAR | Status: AC
  Filled 2018-10-05: qty 5

## 2018-10-05 MED ORDER — LEVETIRACETAM IN NACL 500 MG/100ML IV SOLN
500.0000 mg | Freq: Once | INTRAVENOUS | Status: DC
  Filled 2018-10-05: qty 100

## 2018-10-05 MED ORDER — PROPOFOL 10 MG/ML IV BOLUS
INTRAVENOUS | Status: DC | PRN
  Administered 2018-10-05: 100 mg via INTRAVENOUS

## 2018-10-05 MED ORDER — IOPAMIDOL (ISOVUE-300) INJECTION 61%
INTRAVENOUS | Status: DC | PRN
  Administered 2018-10-05: 5 mL

## 2018-10-05 MED ORDER — PERAMPANEL 4 MG PO TABS
4.0000 mg | ORAL_TABLET | Freq: Every day | ORAL | Status: DC
  Administered 2018-10-05: 4 mg via ORAL

## 2018-10-05 MED ORDER — LEVETIRACETAM 500 MG PO TABS
500.0000 mg | ORAL_TABLET | Freq: Once | ORAL | Status: AC
  Administered 2018-10-05: 500 mg via ORAL
  Filled 2018-10-05: qty 1

## 2018-10-05 MED ORDER — LIDOCAINE 2% (20 MG/ML) 5 ML SYRINGE
INTRAMUSCULAR | Status: DC | PRN
  Administered 2018-10-05: 60 mg via INTRAVENOUS

## 2018-10-05 MED ORDER — ONDANSETRON 4 MG PO TBDP: 4.0000 mg | ORAL_TABLET | Freq: Four times a day (QID) | ORAL | Status: DC | PRN

## 2018-10-05 MED ORDER — FENTANYL CITRATE (PF) 250 MCG/5ML IJ SOLN
INTRAMUSCULAR | Status: DC | PRN
  Administered 2018-10-05 (×2): 50 ug via INTRAVENOUS
  Administered 2018-10-05: 100 ug via INTRAVENOUS
  Administered 2018-10-05: 50 ug via INTRAVENOUS

## 2018-10-05 MED ORDER — LORAZEPAM BOLUS VIA INFUSION: 4.0000 mg | Freq: Once | INTRAVENOUS | Status: DC

## 2018-10-05 MED ORDER — MIDAZOLAM HCL 2 MG/2ML IJ SOLN
INTRAMUSCULAR | Status: AC
  Filled 2018-10-05: qty 4

## 2018-10-05 SURGICAL SUPPLY — 41 items
ADH SKN CLS APL DERMABOND .7 (GAUZE/BANDAGES/DRESSINGS) ×1
APPLIER CLIP 5 13 M/L LIGAMAX5 (MISCELLANEOUS)
APPLIER CLIP ROT 10 11.4 M/L (STAPLE)
APR CLP MED LRG 11.4X10 (STAPLE)
APR CLP MED LRG 5 ANG JAW (MISCELLANEOUS)
BAG SPEC RTRVL 10 TROC 200 (ENDOMECHANICALS) ×1
CABLE HIGH FREQUENCY MONO STRZ (ELECTRODE) ×2 IMPLANT
CHLORAPREP W/TINT 26ML (MISCELLANEOUS) ×2 IMPLANT
CHOLANGIOGRAM CATH TAUT (CATHETERS) ×2 IMPLANT
CLIP APPLIE 5 13 M/L LIGAMAX5 (MISCELLANEOUS) IMPLANT
CLIP APPLIE ROT 10 11.4 M/L (STAPLE) IMPLANT
COVER MAYO STAND STRL (DRAPES) ×2 IMPLANT
COVER SURGICAL LIGHT HANDLE (MISCELLANEOUS) ×2 IMPLANT
COVER WAND RF STERILE (DRAPES) IMPLANT
DECANTER SPIKE VIAL GLASS SM (MISCELLANEOUS) ×2 IMPLANT
DERMABOND ADVANCED (GAUZE/BANDAGES/DRESSINGS) ×1
DERMABOND ADVANCED .7 DNX12 (GAUZE/BANDAGES/DRESSINGS) ×1 IMPLANT
DRAPE C-ARM 42X120 X-RAY (DRAPES) ×2 IMPLANT
ELECT REM PT RETURN 15FT ADLT (MISCELLANEOUS) ×2 IMPLANT
GLOVE SURG SIGNA 7.5 PF LTX (GLOVE) ×2 IMPLANT
GOWN STRL REUS W/TWL XL LVL3 (GOWN DISPOSABLE) ×6 IMPLANT
HEMOSTAT SURGICEL 4X8 (HEMOSTASIS) IMPLANT
IV CATH 14GX2 1/4 (CATHETERS) ×2 IMPLANT
IV SET EXTENSION CATH 6 NF (IV SETS) ×2 IMPLANT
KIT BASIN OR (CUSTOM PROCEDURE TRAY) ×2 IMPLANT
POUCH RETRIEVAL ECOSAC 10 (ENDOMECHANICALS) ×1 IMPLANT
POUCH RETRIEVAL ECOSAC 10MM (ENDOMECHANICALS) ×1
SCISSORS LAP 5X35 DISP (ENDOMECHANICALS) ×2 IMPLANT
SET IRRIG TUBING LAPAROSCOPIC (IRRIGATION / IRRIGATOR) ×2 IMPLANT
SLEEVE ADV FIXATION 5X100MM (TROCAR) ×2 IMPLANT
STOPCOCK 4 WAY LG BORE MALE ST (IV SETS) ×2 IMPLANT
STRIP CLOSURE SKIN 1/4X4 (GAUZE/BANDAGES/DRESSINGS) IMPLANT
SUT MNCRL AB 4-0 PS2 18 (SUTURE) ×2 IMPLANT
SYR 10ML ECCENTRIC (SYRINGE) ×2 IMPLANT
TOWEL OR 17X26 10 PK STRL BLUE (TOWEL DISPOSABLE) ×2 IMPLANT
TOWEL OR NON WOVEN STRL DISP B (DISPOSABLE) ×2 IMPLANT
TRAY LAPAROSCOPIC (CUSTOM PROCEDURE TRAY) ×2 IMPLANT
TROCAR ADV FIXATION 11X100MM (TROCAR) IMPLANT
TROCAR ADV FIXATION 5X100MM (TROCAR) ×2 IMPLANT
TROCAR XCEL BLUNT TIP 100MML (ENDOMECHANICALS) ×2 IMPLANT
TUBING INSUF HEATED (TUBING) ×2 IMPLANT

## 2018-10-05 NOTE — Interval H&P Note (Signed)
History and Physical Interval Note:  10/05/2018 7:14 AM  [name]  has presented today for surgery, with the diagnosis of gall bladder disease  The various methods of treatment have been discussed with the patient and family.  Her mother is here with her today.  After consideration of risks, benefits and other options for treatment, the patient has consented to  Procedure(s): LAPAROSCOPIC CHOLECYSTECTOMY WITH INTRAOPERATIVE CHOLANGIOGRAM ERAS PATHWAY (N/A) as a surgical intervention .  The patient's history has been reviewed, patient examined, no change in status, stable for surgery.  I have reviewed the patient's chart and labs.  Questions were answered to the patient's satisfaction.     Shann Medal
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
## 2018-10-05 NOTE — Anesthesia Procedure Notes (Signed)
Procedure Name: Intubation Date/Time: 10/05/2018 7:45 AM Performed by: Maxwell Caul, CRNA Pre-anesthesia Checklist: Patient identified, Emergency Drugs available, Suction available and Patient being monitored Patient Re-evaluated:Patient Re-evaluated prior to induction Oxygen Delivery Method: Circle system utilized Preoxygenation: Pre-oxygenation with 100% oxygen Induction Type: IV induction Ventilation: Mask ventilation without difficulty Laryngoscope Size: Mac and 4 Grade View: Grade I Tube type: Oral Tube size: 7.0 mm Number of attempts: 1 Airway Equipment and Method: Stylet Placement Confirmation: ETT inserted through vocal cords under direct vision,  positive ETCO2 and breath sounds checked- equal and bilateral Secured at: 21 cm Tube secured with: Tape Dental Injury: Teeth and Oropharynx as per pre-operative assessment
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
## 2018-10-05 NOTE — Discharge Instructions (Signed)
[facility] - DISCHARGE INSTRUCTIONS TO PATIENT  Activity:  Lifting - No lifting more than 15 pounds for 1 week, then no limit  Wound Care:   Leave incision dry until Thursday, then you may shower  Diet:  As tolerated  Follow up appointment:  Call Dr. Pollie Friar office [facility]) at [phone] for an appointment in 2 to 4 weeks.  Medications and dosages:  Resume your home medications.  You have a prescription for:  Vicodin  Call Dr. Lucia Gaskins or his office  [phone]) if you have:  Temperature greater than 100.4,  Persistent nausea and vomiting,  Severe uncontrolled pain,  Redness, tenderness, or signs of infection (pain, swelling, redness, odor or green/yellow discharge around the site),  Difficulty breathing, headache or visual disturbances,  Any other questions or concerns you may have after discharge.  In an emergency, call 911 or go to an Emergency Department at a nearby hospital.

## 2018-10-05 NOTE — Transfer of Care (Addendum)
Immediate Anesthesia Transfer of Care Note  Patient: [name]  Procedure(s) Performed: LAPAROSCOPIC CHOLECYSTECTOMY WITH INTRAOPERATIVE CHOLANGIOGRAM ERAS PATHWAY (N/A Abdomen)  Patient Location: PACU  Anesthesia Type:General  Level of Consciousness: awake, alert  and oriented  Airway & Oxygen Therapy: Patient Spontanous Breathing and Patient connected to face mask oxygen  Post-op Assessment: Report given to RN and Post -op Vital signs reviewed and stable  Post vital signs: Reviewed and stable  Last Vitals:  Vitals Value Taken Time  BP 126/68 10/05/2018  9:18 AM  Temp    Pulse 53 10/05/2018  9:28 AM  Resp 16 10/05/2018  9:28 AM  SpO2 100 % 10/05/2018  9:28 AM  Vitals shown include unvalidated device data.  Last Pain:  Vitals:   10/05/18 0545  TempSrc: Oral         Complications: No apparent anesthesia complications (see note about seizure in PACU and treatment).

## 2018-10-05 NOTE — Progress Notes (Signed)
In the ICU in 1228. Mother and Theresia Lo, service dog, at bedside.  She is droggy, but doing better.  She does not remember me coming by earlier today.  She now has more abdominal pain (appropriate pain) than headaches. She is taking some liquids.  Her vital signs are stable and she looks okay.  I appreciate the assistance of Drs. Gerghe and Aroor.  Alphonsa Overall, MD, [facility] Pager: [phone] Office phone:  [phone]

## 2018-10-05 NOTE — Op Note (Addendum)
10/05/2018  9:00 AM  PATIENT:  [name], 27 y.o., female, MRN: [id number]  PREOP DIAGNOSIS:  gall bladder disease  POSTOP DIAGNOSIS:   Gall bladder disease  PROCEDURE:   Procedure(s):  LAPAROSCOPIC CHOLECYSTECTOMY WITH INTRAOPERATIVE CHOLANGIOGRAM ERAS PATHWAY  SURGEON:   Alphonsa Overall, M.D.  Terrence DupontBethena Midget, M.D.  ANESTHESIA:   general  Anesthesiologist: Freddrick March, MD CRNA: Lollie Sails, CRNA; Maxwell Caul, CRNA  General  ASA: 3  EBL:  minimal  ml  BLOOD ADMINISTERED: none  DRAINS: none   LOCAL MEDICATIONS USED:   30 cc 1/4% marcaine  SPECIMEN:   Gall bladder   COUNTS CORRECT:  YES  INDICATIONS FOR PROCEDURE:  [name] is a 27 y.o. (DOB: 1991/11/21) white female whose primary care physician is Nafziger, [name], NP and comes for cholecystectomy.   The indications and risks of the gall bladder surgery were explained to the patient.  The risks include, but are not limited to, infection, bleeding, common bile duct injury and open surgery.  SURGERY:  The patient was taken to OR room #1 at [facility].  The abdomen was prepped with chloroprep.  The patient was not given antibiotics during the procedure.   A time out was held and the surgical checklist run.   An infraumbilical incision was made into the abdominal cavity.  A 12 mm Hasson trocar was inserted into the abdominal cavity through the infraumbilical incision and secured with a 0 Vicryl suture.  Three additional trocars were inserted: a 5 mm trocar in the sub-xiphoid location, a 5 mm trocar in the right mid subcostal area, and a 5 mm trocar in the right lateral subcostal area.   The abdomen was explored and the liver, stomach, and bowel that could be seen were unremarkable.  She has little body fat and the peritoneal cavity was unremarkable.   The gall bladder was mildly thickened and had some adhesions of the duodenum to the gall bladder.   I grasped the gall bladder and rotated  it cephalad.  Disssection was carried down to the gall bladder/cystic duct junction and the cystic duct isolated.  A clip was placed on the gall bladder side of the cystic duct.   An intra-operative cholangiogram was shot.   The intra-operative cholangiogram was shot using a cut off Taut catheter placed through a 14 gauge angiocath in the RUQ.  The Taut catheter was inserted in the cut cystic duct and secured with an endoclip.  A cholangiogram was shot with 5 cc of 1/2 strength Isoview.  Using fluoroscopy, the cholangiogram showed the flow of contrast into the common bile duct, up the hepatic radicals, and into the duodenum.  There was no mass or obstruction.  This was a normal intra-operative cholangiogram.   The Taut catheter was removed.  The cystic duct was tripley endoclipped and the cystic artery was identified and clipped.  The gall bladder was bluntly and sharpley dissected from the gall bladder bed.   After the gall bladder was removed from the liver, the gall bladder bed and [place] were inspected.  There was no bleeding or bile leak.  The gall bladder was placed in a Ecco Sac bag and delivered through the umbilicus.  The abdomen was irrigated with 400 cc saline.   The trocars were then removed.  I infiltrated 30cc of 1/4% Marcaine into the incisions.  The umbilical port closed with a 0 Vicryl suture and the skin closed with 4-0 Monocryl.  The skin was painted with DermaBond.  The patient's sponge and needle count were correct.  The patient was transported to the RR in good condition.  Alphonsa Overall, MD, [facility] Surgery Pager: [phone] Office phone:  [phone]

## 2018-10-05 NOTE — Progress Notes (Addendum)
Justin in pharmacy verified 3grams KEPPRA dispensed.  2.5 grams given  (500 mg found in tube station).  Dr Lanetta Inch aware.

## 2018-10-05 NOTE — Progress Notes (Signed)
1600-pt had a seizure. She was unresponsive for 1 min and alerted the RN prior to the seizure starting

## 2018-10-06 ENCOUNTER — Encounter (HOSPITAL_COMMUNITY): Payer: Self-pay | Admitting: Surgery

## 2018-10-06 DIAGNOSIS — H9190 Unspecified hearing loss, unspecified ear: Secondary | ICD-10-CM | POA: Diagnosis not present

## 2018-10-06 DIAGNOSIS — Z881 Allergy status to other antibiotic agents status: Secondary | ICD-10-CM | POA: Diagnosis not present

## 2018-10-06 DIAGNOSIS — Z95828 Presence of other vascular implants and grafts: Secondary | ICD-10-CM | POA: Diagnosis not present

## 2018-10-06 DIAGNOSIS — Z91013 Allergy to seafood: Secondary | ICD-10-CM | POA: Diagnosis not present

## 2018-10-06 DIAGNOSIS — G40309 Generalized idiopathic epilepsy and epileptic syndromes, not intractable, without status epilepticus: Secondary | ICD-10-CM | POA: Diagnosis not present

## 2018-10-06 DIAGNOSIS — K811 Chronic cholecystitis: Secondary | ICD-10-CM | POA: Diagnosis not present

## 2018-10-06 DIAGNOSIS — Z8249 Family history of ischemic heart disease and other diseases of the circulatory system: Secondary | ICD-10-CM | POA: Diagnosis not present

## 2018-10-06 DIAGNOSIS — Z888 Allergy status to other drugs, medicaments and biological substances status: Secondary | ICD-10-CM | POA: Diagnosis not present

## 2018-10-06 DIAGNOSIS — M24412 Recurrent dislocation, left shoulder: Secondary | ICD-10-CM | POA: Diagnosis not present

## 2018-10-06 DIAGNOSIS — Z880 Allergy status to penicillin: Secondary | ICD-10-CM | POA: Diagnosis not present

## 2018-10-06 DIAGNOSIS — J45909 Unspecified asthma, uncomplicated: Secondary | ICD-10-CM | POA: Diagnosis not present

## 2018-10-06 DIAGNOSIS — Z91040 Latex allergy status: Secondary | ICD-10-CM | POA: Diagnosis not present

## 2018-10-06 DIAGNOSIS — Z9049 Acquired absence of other specified parts of digestive tract: Secondary | ICD-10-CM | POA: Diagnosis not present

## 2018-10-06 DIAGNOSIS — F419 Anxiety disorder, unspecified: Secondary | ICD-10-CM | POA: Diagnosis not present

## 2018-10-06 DIAGNOSIS — E8842 MERRF syndrome: Secondary | ICD-10-CM | POA: Diagnosis not present

## 2018-10-06 DIAGNOSIS — Z79899 Other long term (current) drug therapy: Secondary | ICD-10-CM | POA: Diagnosis not present

## 2018-10-06 MED ORDER — HYDROCODONE-ACETAMINOPHEN 5-325 MG PO TABS: 1.0000 | ORAL_TABLET | ORAL | 0 refills | Status: DC | PRN

## 2018-10-06 NOTE — Care Management Note (Signed)
Case Management Note  Patient Details  Name: Kerissa Coia MRN: [id number] Date of Birth: 1991-10-14  Subjective/Objective:                  Discharged to home with self care  Action/Plan: Orders checked for hhc and cm needs none found  Expected Discharge Date:  10/06/18               Expected Discharge Plan:  Home/Self Care  In-House Referral:     Discharge planning Services  CM Consult  Post Acute Care Choice:    Choice offered to:     DME Arranged:    DME Agency:     HH Arranged:    [place] Agency:     Status of Service:  Completed, signed off  If discussed at [employer] of Stay Meetings, dates discussed:    Additional Comments:  Leeroy Cha, RN 10/06/2018, 8:52 AM

## 2018-10-06 NOTE — Discharge Summary (Signed)
Physician Discharge Summary  Patient ID:  [name]  MRN: [id number]  DOB/AGE: 11-Sep-1991 27 y.o.  Admit date: 10/05/2018 Discharge date: 10/06/2018  Discharge Diagnoses:  1.  GALL BLADDER DISEASE (K82.9) 2.  Seizures 3.  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 Valory Wetherby                         2nd power port - 12/06/2017 - D. Dahna Hattabaugh 5. Chronic dislocations 6. Hearing loss   Active Problems:   Seizures ([name])   Seizure ([name])   Gall bladder disease   Operation: Procedure(s):  LAPAROSCOPIC CHOLECYSTECTOMY WITH INTRAOPERATIVE CHOLANGIOGRAM  on 10/05/2018 - D. Lucia Gaskins  Discharged Condition: good  Hospital Course: [name] is an 27 y.o. female whose primary care physician is Dorothyann Peng, NP and who was admitted 10/05/2018 with a chief complaint of No chief complaint on file. Marland Kitchen   She was brought to the operating room on 10/05/2018 and underwent a lap chole with IOC.    Post op, she developed seizures in the recovery room.   I consulted the hospitalist (Dr.Gherghe) and neurology (Dr. Lorraine Lax) for assistance in managing these seizures.  She was placed in the ICU overnight.  She was placed back on her home anti-seizure meds.  She is still having some auras, but has otherwise done well overnight.   She does not remember anything of yesterday.  At the time of my rounds, her mother is at the bedside.   The discharge instructions were reviewed with the patient.  Consults: hospitalist and neurologist  Significant Diagnostic Studies: Results for orders placed or performed during the hospital encounter of 10/05/18  hCG, serum, qualitative  Result Value Ref Range   Preg, Serum NEGATIVE NEGATIVE    Dg  Cholangiogram Operative  Result Date: 10/05/2018 CLINICAL DATA:  Intraoperative cholangiogram during laparoscopic cholecystectomy. EXAM: INTRAOPERATIVE CHOLANGIOGRAM FLUOROSCOPY TIME:  8 seconds COMPARISON:  Right upper quadrant abdominal ultrasound-09/20/2018 FINDINGS: Intraoperative cholangiographic images of the right upper abdominal quadrant during laparoscopic cholecystectomy are provided for review. Surgical clips overlie the expected location of the gallbladder fossa. Contrast injection demonstrates selective cannulation of the central aspect of the cystic duct. There is passage of contrast through the central aspect of the cystic duct with filling of a non dilated common bile duct. There is passage of contrast though the CBD and into the descending portion of the duodenum. There is minimal reflux of injected contrast into the common hepatic duct and central aspect of the non dilated intrahepatic biliary system. There are no discrete filling defects within the opacified portions of the biliary system to suggest the presence of choledocholithiasis. IMPRESSION: No evidence of choledocholithiasis. Electronically Signed   By: Sandi Mariscal M.D.   On: 10/05/2018 08:44   Dg Shoulder Left  Result Date: 09/20/2018 CLINICAL DATA:  Recent seizure activity with fall on shoulder, initial encounter EXAM: LEFT SHOULDER - 2+ VIEW COMPARISON:  06/27/2017 FINDINGS: There is been anterior inferior dislocation of the left humeral head with respect to the glenoid. No acute fracture is  seen. No soft tissue abnormality is noted. IMPRESSION: Anterior inferior dislocation of the left humeral head. Electronically Signed   By: Inez Catalina M.D.   On: 09/20/2018 20:56   US Abdomen Limited Ruq  Result Date: 09/20/2018 CLINICAL DATA:  Upper abdominal pain EXAM: ULTRASOUND ABDOMEN LIMITED RIGHT UPPER QUADRANT COMPARISON:  None. FINDINGS: Gallbladder: No gallstones or wall thickening visualized. No pericholecystic fluid. A small  amount of sludge evident. No sonographic Murphy sign noted by sonographer. Common bile duct: Diameter: 2 mm. No intrahepatic or extrahepatic biliary duct dilatation. Liver: No focal lesion identified. Within normal limits in parenchymal echogenicity. Portal vein is patent on color Doppler imaging with normal direction of blood flow towards the liver. IMPRESSION: Small amount of sludge in gallbladder. Gallbladder otherwise appears normal. Study otherwise unremarkable. Electronically Signed   By: Lowella Grip III M.D.   On: 09/20/2018 20:49    Discharge Exam:  Vitals:   10/06/18 0700 10/06/18 0800  BP: (!) 103/53 108/60  Pulse: 70 80  Resp: 15 13  Temp:    SpO2: 99% 99%    General: WN young WF who is alert and generally healthy appearing.  Lungs: Clear to auscultation and symmetric breath sounds. Heart:  RRR. No murmur or rub. Abdomen: Soft. No hernia. Normal bowel sounds.   She is tender at her incisions.  Discharge Medications:   Allergies as of 10/06/2018      Reactions   Cefzil [cefprozil] Anaphylaxis, Shortness Of Breath, Rash   Cephalosporins Anaphylaxis, Shortness Of Breath, Rash   Midazolam Nausea And Vomiting   Shellfish Allergy Hives, Rash   Shellfish-derived Products Hives, Rash   Latex Rash   Penicillins Hives, Rash, Other (See Comments)   Has patient had a PCN reaction causing immediate rash, facial/tongue/throat swelling, SOB or lightheadedness with hypotension: Yes Has patient had a PCN reaction causing severe rash involving mucus membranes or skin necrosis: No Has patient had a PCN reaction that required hospitalization: No Has patient had a PCN reaction occurring within the last 10 years: No If all of the above answers are "NO", then may proceed with Cephalosporin use.   Tegaderm Ag Mesh [silver] Rash      Medication List    TAKE these medications   clindamycin 1 % lotion Commonly known as:  CLEOCIN T Apply 1 application topically See admin instructions.  Apply to affected areas of the face 2 times a day   clindamycin-benzoyl peroxide gel Commonly known as:  BENZACLIN Apply 1 application topically See admin instructions. Apply to face 2 times a day as directed   CoQ10 100 MG Caps Take 100 mg by mouth daily.   HYDROcodone-acetaminophen 5-325 MG tablet Commonly known as:  NORCO/VICODIN Take 1-2 tablets by mouth every 4 (four) hours as needed for moderate pain.   ibuprofen 200 MG tablet Commonly known as:  ADVIL,MOTRIN Take 800 mg by mouth every 8 (eight) hours as needed (for cramping or pain).   KEPPRA 500 MG tablet Generic drug:  levETIRAcetam Take 1 tablet three times a day What changed:    how much to take  how to take this  when to take this   Lacosamide 100 MG Tabs Take 1 tablet (100 mg total) by mouth 2 (two) times daily.   lamoTRIgine 200 MG tablet Commonly known as:  LAMICTAL Take 200 mg by mouth 2 (two) times daily.   LORazepam 2 MG/ML injection Commonly known as:  ATIVAN Inject 2 mg into the muscle every 8 (eight) hours as  needed (for seizure control). What changed:  Another medication with the same name was changed. Make sure you understand how and when to take each.   LORazepam 0.5 MG tablet Commonly known as:  ATIVAN Take 1 tablet (0.5 mg total) by mouth as needed for anxiety. What changed:    when to take this  reasons to take this   Perampanel 4 MG Tabs Take 2 tablets at night What changed:    how much to take  how to take this  when to take this  additional instructions   tretinoin 0.025 % cream Commonly known as:  RETIN-A Apply 1 application topically See admin instructions. Apply to face daily as directed   triamcinolone lotion 0.1 % Commonly known as:  KENALOG Apply 1 application topically See admin instructions. Apply to scalp as directed 2 times a day       Disposition: Discharge disposition: 01-Home or Self Care       Discharge Instructions    Diet - low sodium heart  healthy   Complete by:  As directed    Increase activity slowly   Complete by:  As directed       Signed: Alphonsa Overall, M.D., [facility] Office:  [phone]  10/06/2018, 8:41 AM

## 2018-10-06 NOTE — Progress Notes (Signed)
Patient and mother verbalized understanding of discharge instructions. Patient is stable at discharge.

## 2018-10-06 NOTE — Anesthesia Postprocedure Evaluation (Signed)
Anesthesia Post Note  Patient: [name]  Procedure(s) Performed: LAPAROSCOPIC CHOLECYSTECTOMY WITH INTRAOPERATIVE CHOLANGIOGRAM ERAS PATHWAY (N/A Abdomen)     Patient location during evaluation: PACU Anesthesia Type: General Post-procedure mental status: see note below. Pain management: pain level controlled Vital Signs Assessment: post-procedure vital signs reviewed and stable Respiratory status: spontaneous breathing, nonlabored ventilation and respiratory function stable Cardiovascular status: blood pressure returned to baseline and stable Postop Assessment: no apparent nausea or vomiting Anesthetic complications: yes Anesthetic complication details: anesthesia complicationsComments: Pt arrived to the PACU without complications. Vital signs were stable. After approximately 5 min in the PACU, she started complaining of a "zapping" pain in the frontal area. She then experienced a grand mal seizure. Oxygen was already being delivered via face mask. Airway and hemodynamics remained stable. 4mg  IV midazolam administered, and the seizure resolved after approximately 15 sec. She became alert and oriented. However, 10-15 minutes later, she experienced another grand mal seizure. 4mg  IV ativan was administered, and the seizure resolved. She again became alert and oriented. Airway and hemodynamics were never compromised. She continued to experience grand mal seizures and complex partial seizures. They occurred over 10-15 min intervals lasting no more than 15 sec. She received 2.5g Keppra, 750mg  phenytoin, and another 4mg  IV ativan (for a total of 8mg ). She was observed in the PACU for approximately 3 hours. Upon discharge, she had not seized for 1 hour and 30 minutes.     Last Vitals:  Vitals:   10/06/18 0700 10/06/18 0800  BP: (!) 103/53 108/60  Pulse: 70 80  Resp: 15 13  Temp:  37.1 C  SpO2: 99% 99%    Last Pain:  Vitals:   10/06/18 0800  TempSrc: Oral  PainSc: 0-No pain   Pain  Goal: Patients Stated Pain Goal: 2 (10/05/18 2125)               [place]

## 2018-10-20 ENCOUNTER — Other Ambulatory Visit (HOSPITAL_COMMUNITY): Payer: Self-pay | Admitting: Orthopedic Surgery

## 2018-10-20 DIAGNOSIS — S43005A Unspecified dislocation of left shoulder joint, initial encounter: Secondary | ICD-10-CM

## 2018-10-20 DIAGNOSIS — M25312 Other instability, left shoulder: Secondary | ICD-10-CM | POA: Diagnosis not present

## 2018-11-02 ENCOUNTER — Telehealth: Payer: Self-pay | Admitting: Neurology

## 2018-11-02 NOTE — Telephone Encounter (Signed)
Patient is calling in needing to speak with you. She did not go into details. Please call her back at [phone]. Thanks!

## 2018-11-03 ENCOUNTER — Ambulatory Visit (HOSPITAL_COMMUNITY)
Admission: RE | Admit: 2018-11-03 | Discharge: 2018-11-03 | Disposition: A | Discharge status: Home / Self Care | Payer: BLUE CROSS/BLUE SHIELD | Source: Ambulatory Visit | Attending: Orthopedic Surgery | Admitting: Orthopedic Surgery

## 2018-11-23 ENCOUNTER — Ambulatory Visit (HOSPITAL_COMMUNITY)
Admission: RE | Admit: 2018-11-23 | Discharge: 2018-11-23 | Disposition: A | Discharge status: Home / Self Care | Payer: BLUE CROSS/BLUE SHIELD | Source: Ambulatory Visit | Attending: Orthopedic Surgery | Admitting: Orthopedic Surgery

## 2018-11-23 ENCOUNTER — Encounter (HOSPITAL_COMMUNITY): Payer: Self-pay

## 2018-11-23 ENCOUNTER — Ambulatory Visit (HOSPITAL_COMMUNITY): Payer: BLUE CROSS/BLUE SHIELD

## 2018-11-26 ENCOUNTER — Encounter (HOSPITAL_COMMUNITY): Payer: Self-pay

## 2018-12-01 ENCOUNTER — Ambulatory Visit (HOSPITAL_COMMUNITY)
Admission: RE | Admit: 2018-12-01 | Discharge: 2018-12-01 | Disposition: A | Discharge status: Home / Self Care | Payer: BLUE CROSS/BLUE SHIELD | Source: Ambulatory Visit | Attending: Adult Health | Admitting: Adult Health

## 2018-12-01 DIAGNOSIS — Z451 Encounter for adjustment and management of infusion pump: Secondary | ICD-10-CM | POA: Diagnosis not present

## 2018-12-01 MED ORDER — HEPARIN SOD (PORK) LOCK FLUSH 100 UNIT/ML IV SOLN
500.0000 [IU] | INTRAVENOUS | Status: AC | PRN
  Administered 2018-12-01: 500 [IU]
  Filled 2018-12-01: qty 5

## 2018-12-01 MED ORDER — SODIUM CHLORIDE 0.9% FLUSH
10.0000 mL | INTRAVENOUS | Status: AC | PRN
  Administered 2018-12-01: 10 mL

## 2018-12-01 NOTE — Discharge Instructions (Signed)
Implanted port flush °

## 2018-12-01 NOTE — Progress Notes (Signed)
Patient came for port a cath flush. Port unremarkable, flushed with NS and heparin 500 units.tolerated the procedure. Vital signs obtained before and after the procedure. Discharge instructions given, verbalized understanding. Alert, oriented and ambulatory at discharge.

## 2018-12-13 ENCOUNTER — Other Ambulatory Visit: Payer: Self-pay | Admitting: Neurology

## 2018-12-13 DIAGNOSIS — G40019 Localization-related (focal) (partial) idiopathic epilepsy and epileptic syndromes with seizures of localized onset, intractable, without status epilepticus: Secondary | ICD-10-CM

## 2018-12-14 ENCOUNTER — Telehealth: Payer: Self-pay | Admitting: Neurology

## 2018-12-14 ENCOUNTER — Ambulatory Visit (INDEPENDENT_AMBULATORY_CARE_PROVIDER_SITE_OTHER): Payer: BLUE CROSS/BLUE SHIELD | Admitting: Adult Health

## 2018-12-14 ENCOUNTER — Encounter: Payer: Self-pay | Admitting: Adult Health

## 2018-12-14 ENCOUNTER — Encounter: Payer: Self-pay | Admitting: Family Medicine

## 2018-12-14 VITALS — BP 98/80 | Temp 98.3°F | Ht 63.5 in

## 2018-12-14 DIAGNOSIS — Z23 Encounter for immunization: Secondary | ICD-10-CM

## 2018-12-14 DIAGNOSIS — Z020 Encounter for examination for admission to educational institution: Secondary | ICD-10-CM | POA: Diagnosis not present

## 2018-12-14 NOTE — Telephone Encounter (Signed)
Medications were not denied.  They are controlled and require provider approval.   Fycompa 4mg  #60 with 5 refills Sig = 2 Tab QHS  Lamictal 200mg  #60 with 5 refills Sig = 1 Tab BID  Vimpat 100mg  #60 with 5 refills Sig = 1 Tab BID  Approved by Dr. Delice Lesch at 9:11AM and forwarded to [facility] on  [facility].
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
## 2018-12-14 NOTE — Telephone Encounter (Signed)
Patient states that her medication refill are about to run out and the pharmacy states that the medication refills were denied and she wants to know why please call

## 2018-12-15 DIAGNOSIS — J189 Pneumonia, unspecified organism: Secondary | ICD-10-CM

## 2018-12-15 HISTORY — DX: Pneumonia, unspecified organism: J18.9

## 2018-12-16 ENCOUNTER — Other Ambulatory Visit: Payer: Self-pay | Admitting: Neurology

## 2018-12-16 DIAGNOSIS — G40019 Localization-related (focal) (partial) idiopathic epilepsy and epileptic syndromes with seizures of localized onset, intractable, without status epilepticus: Secondary | ICD-10-CM

## 2018-12-22 ENCOUNTER — Other Ambulatory Visit: Payer: Self-pay | Admitting: Neurology

## 2018-12-22 DIAGNOSIS — G40019 Localization-related (focal) (partial) idiopathic epilepsy and epileptic syndromes with seizures of localized onset, intractable, without status epilepticus: Secondary | ICD-10-CM

## 2018-12-24 ENCOUNTER — Other Ambulatory Visit: Payer: Self-pay | Admitting: Neurology

## 2018-12-24 DIAGNOSIS — G40019 Localization-related (focal) (partial) idiopathic epilepsy and epileptic syndromes with seizures of localized onset, intractable, without status epilepticus: Secondary | ICD-10-CM

## 2018-12-25 DIAGNOSIS — R062 Wheezing: Secondary | ICD-10-CM | POA: Diagnosis not present

## 2018-12-25 DIAGNOSIS — R05 Cough: Secondary | ICD-10-CM | POA: Diagnosis not present

## 2018-12-25 DIAGNOSIS — J22 Unspecified acute lower respiratory infection: Secondary | ICD-10-CM | POA: Diagnosis not present

## 2018-12-28 ENCOUNTER — Other Ambulatory Visit: Payer: Self-pay | Admitting: Neurology

## 2019-01-04 ENCOUNTER — Other Ambulatory Visit: Payer: Self-pay | Admitting: Neurology

## 2019-01-28 DIAGNOSIS — Z452 Encounter for adjustment and management of vascular access device: Secondary | ICD-10-CM | POA: Diagnosis not present

## 2019-01-28 DIAGNOSIS — Z95828 Presence of other vascular implants and grafts: Secondary | ICD-10-CM | POA: Diagnosis not present

## 2019-01-28 DIAGNOSIS — Z789 Other specified health status: Secondary | ICD-10-CM | POA: Diagnosis not present

## 2019-02-07 ENCOUNTER — Telehealth: Payer: Self-pay

## 2019-02-07 NOTE — Telephone Encounter (Signed)
Spoke with pt relaying message below.  She states that she is in the middle of mid-terms at the moment and will most likely not be able to look up places to have vEEG done prior to her visit with Dr. Delice Lesch next month.  States that she will hopefully have some information by her appointment, or shortly after.  Advised that I will send referral/order to facility once she has one in mind.

## 2019-02-07 NOTE — Telephone Encounter (Signed)
Received call from pt asking about birth control options.  States that she is not sexually active but is experiencing irregular periods.  I let her know that generally we suggest BC with the least amount of hormones.  Pt states "It's not like I can have a healthy pregnancy anyway, since I'm epileptic".  I let her know that it is entirely possible to have a healthy pregnancy, however we closely monitor medication levels throughout pregnancy as pregnancy hormones can decrease the efficacy of seizure medications. Pt goes on to state that she has recurrent ovarian cysts as well as endometriosis.  States that OB/GYN in [place] is "extreme" stating that she suggested pt have hysterectomy.    Pt inquires about vEEG.  Asks if it is "actually necessary".  States that her insurance in through her school in [place] and it's hard to get things paid for when they are done in [place].  I advised that we may need to have vEEG done in [place] and have results sent to our office.     Pt asks about follow up appointment stating that she will be back in [place] the last 2 weeks of March.  Appointment made for 03/10/2019 @ 3:30PM.    pls advise.

## 2019-02-07 NOTE — Telephone Encounter (Signed)
For seizure patients on medications, I usually recommend either Mirena IUD or Depo-Provera. At this point I do think it is necessary to do the vEEG, if she would like to have it done in [place], let us know which facility. Thanks

## 2019-02-10 ENCOUNTER — Other Ambulatory Visit: Payer: Self-pay | Admitting: Neurology

## 2019-02-10 DIAGNOSIS — G40019 Localization-related (focal) (partial) idiopathic epilepsy and epileptic syndromes with seizures of localized onset, intractable, without status epilepticus: Secondary | ICD-10-CM

## 2019-02-14 DIAGNOSIS — F4310 Post-traumatic stress disorder, unspecified: Secondary | ICD-10-CM | POA: Diagnosis not present

## 2019-02-21 DIAGNOSIS — F4310 Post-traumatic stress disorder, unspecified: Secondary | ICD-10-CM | POA: Diagnosis not present

## 2019-02-25 ENCOUNTER — Ambulatory Visit: Payer: BLUE CROSS/BLUE SHIELD | Admitting: Clinical

## 2019-02-25 ENCOUNTER — Other Ambulatory Visit: Payer: Self-pay

## 2019-02-25 DIAGNOSIS — F840 Autistic disorder: Secondary | ICD-10-CM

## 2019-03-02 ENCOUNTER — Ambulatory Visit: Payer: BLUE CROSS/BLUE SHIELD | Admitting: Clinical

## 2019-03-07 ENCOUNTER — Telehealth: Payer: Self-pay

## 2019-03-07 NOTE — Telephone Encounter (Signed)
Spoke with pt advising that we are currently not seeing pt's in the office due to the COVID-19 pandemic.  Verified pt's email and telephone number are correct in system.  Advised that our office will be in touch to schedule Webex visit.

## 2019-03-10 ENCOUNTER — Ambulatory Visit: Payer: Self-pay | Admitting: Neurology

## 2019-03-16 ENCOUNTER — Other Ambulatory Visit: Payer: Self-pay

## 2019-03-16 ENCOUNTER — Telehealth (INDEPENDENT_AMBULATORY_CARE_PROVIDER_SITE_OTHER): Payer: BLUE CROSS/BLUE SHIELD | Admitting: Neurology

## 2019-03-16 DIAGNOSIS — G40019 Localization-related (focal) (partial) idiopathic epilepsy and epileptic syndromes with seizures of localized onset, intractable, without status epilepticus: Secondary | ICD-10-CM

## 2019-03-16 DIAGNOSIS — G43009 Migraine without aura, not intractable, without status migrainosus: Secondary | ICD-10-CM

## 2019-03-16 MED ORDER — LAMOTRIGINE 100 MG PO TABS: ORAL_TABLET | ORAL | 3 refills | Status: DC

## 2019-03-16 MED ORDER — LEVETIRACETAM 500 MG PO TABS: ORAL_TABLET | ORAL | 11 refills | Status: DC

## 2019-03-16 MED ORDER — LACOSAMIDE 100 MG PO TABS: 1.0000 | ORAL_TABLET | Freq: Two times a day (BID) | ORAL | 5 refills | Status: DC

## 2019-03-16 MED ORDER — NORTRIPTYLINE HCL 10 MG PO CAPS: 10.0000 mg | ORAL_CAPSULE | Freq: Every day | ORAL | 11 refills | Status: DC

## 2019-03-16 MED ORDER — PERAMPANEL 4 MG PO TABS: 2.0000 | ORAL_TABLET | Freq: Every evening | ORAL | 5 refills | Status: DC

## 2019-03-16 NOTE — Patient Instructions (Signed)
1. Increase Keppra 500mg : Take 1 tab in AM, 1 tab at noon, 2 tabs in PM  2. After 2 weeks of increasing the Keppra, start the nortriptyline 10mg : take 1 capsule every night  3. Continue Lamictal 100mg  every night, Vimpat 100mg  twice a day, and Fycompa 4mg  2 tabs every night  4. Follow-up in 3-4 months, call for any changes

## 2019-03-16 NOTE — Progress Notes (Signed)
Virtual Visit via Video Note The purpose of this virtual visit is to provide medical care while limiting exposure to the novel coronavirus.    Consent was obtained for video visit:  Yes.   Answered questions that patient had about telehealth interaction:  Yes.   I discussed the limitations, risks, security and privacy concerns of performing an evaluation and management service by telemedicine. I also discussed with the patient that there may be a patient responsible charge related to this service. The patient expressed understanding and agreed to proceed.  Pt location: Home Physician Location: office Name of referring provider:  Dorothyann Peng, NP I connected with [name] at patients initiation/request on 03/16/2019 at  2:00 PM EDT by video enabled telemedicine application and verified that I am speaking with the correct person using two identifiers. 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 Since her last visit, she has been to the ER in [place] several times for seizures with left shoulder dislocation. It is noted that during her ER visit, she was "purposefully moving her shoulder in a manner that would cause a recurrent dislocation, she manipulated her shoulder out of place and put her shoulder back in place." She had a seizure that began with drooling the grand-mal seizure activity. While neurology was present, she had a 7 minute seizure then awoke and answered appropriately with no evidence of a post-ictal period. She had another one with myoclonic jerks and looking to the left. She is currently taking Keppra 500mg  TID, Vimpat 100mg  BID, Fycompa 4mg  2 tabs qhs, and Lamictal 100mg  qhs. She has been back in [place] for the past 2 weeks due to the pandemic, and states that being home with less stress, she has had no bad seizures. She has had mild to moderate ones and  feels they are slowing down. She feels she is not having as much issues with side effects like before. She is having a lot of headaches, every 3 days, lasting an hour. There is usually throbbing on the right parietal region with sensitivity to light and sound, no nausea/vomiting. She usually lies down and closes her eyes, Advil helps sometimes. Sleep has not bee good lately, "may be a lot of my emotional psyche," she wakes up several times at night and feels drowsy during the day. She also feels groggy and less sharp 30 minutes after taking her morning seizure medications. She has been told by her dentist that due to significant bruxisim and biting down, one of her molars had to be reconstructed, she will start using a mouth guard. She is also having more muscle aches.   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 She reports that she had a lot of instances of "spacing out" as a teenager, but was not diagnosed with seizures until she had her first generalized tonic-clonic seizure at age 28. She initially did not recognize any warning symptoms, but now feels lightheaded/dizzy "like a white noise," both arms are numb and tingly, then loses consciousness. Witnesses have told her she about complex partial seizures  where her eyes are open, she would vocalize, smack her lips, and fidget. After a seizure, her left side feels weaker. She reports that GTCs mostly occur when she is sick or sleep deprived, last GTC was in June. She usually has a couple of complex partial seizures before having a convulsion. One time she had 4 GTCs in a week. She has dislocated her shoulder "hundreds of times" since age 28,  particularly the left shoulder. She has frequent focal seizures occurring at least once a week for the past 1.5-2 years. She reports they cluster for several days in a row, sometimes a couple of times a day. She had one yesterday and the day prior. Previous to this was a week ago. She has been travelling recently and has more seizures when stressed or sleep deprived. She has noticed that sometimes one of her limbs would jerk a little bit. She would have occasional brief twitching in her arms like a tremor with no associated aura or convulsion, more in the evening hours. She has nocturnal seizures where she wakes up with urinary incontinence every 2 weeks or so. She had tried Tegretol in the past, which was ineffective. She is currently on Lamotrigine 200mg  BID, Keppra 1000, 1000mg , 500mg , and Vimpat 50mg  BID. She has been taking Lamotrigine since age 28, no side effects. She feels different if she takes it an hour late. She has been on Keppra since age 28, and Vimpat was started 1.5 years ago. She feels Vimpat has been the most helpful by far, with less severe convulsions. She gets a little dizzy and nauseated after taking Vimpat. She had a VNS placed for 3.5 weeks, then had it taken out because there were no resources for VNS in [place], [place]. She was told she had to go to [place] 2 hours away to interrogate the device, and special permission was needed for people in school to help swipe her magnet.  She occasionally has gustatory hallucinations of tasting cheese. She gets a lot of nausea but they are not clearly related to the seizures. For the past couple of years, she has headaches a couple of times a week, usually over the frontal and temporal regions where she gets dizzy, vision "gets funky," with nausea and light sensitivity (sometimes triggering headaches).  Naproxen helps. Pain is intense for about 20-30 minutes, then hurts for hours. She has neck and back pain. No clear relation to menstrual cycle, her  periods are very irregular, she reports amenorrhea for 4 years at one point and has not had this checked. She constantly feels her left side has less sensation, she has difficulty typing with her left hand. 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 She has some accommodations where lessons are transcribed for her. She has been having a lot of difficulties with her professors in school. She has a seizure dog who can detect the GTCs but only detect the complex partial seizure "50% of the time." She is concerned that she is getting sick all the time, she is sick at least once a month "I catch everything," and has started to have epistaxis.   Epilepsy Risk Factors:  MERRF. She reports her sister was tested and is a carrier. Otherwise she had a normal birth and early development.  There is no history of febrile convulsions, CNS infections such as meningitis/encephalitis, significant traumatic brain injury, neurosurgical procedures, or  family history of seizures.  Prior AEDs: Tegretol, Zonisamide, VNS (taken out because no resources in [place])  EEGs: 08/17/2009 at [facility] was a normal wake EEG 09/09/2009 24-hour EEG at [facility] was normal. Pushbutton events not associated with EEG change MRI: MRI brain without contrast done 04/17/2011 reported as normal, images unavailable for review   Observations/Objective:  Patient is awake, alert, oriented x 3. No aphasia or dysarthria. Intact fluency and comprehension. Cranial nerves: pupils equal, round. Extraocular movements intact with no nystagmus. No facial asymmetry. Motor: moves all extremities symmetrically. Gait: narrow-based and steady.  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 She has had progressive hearing loss since age 40, myoclonic jerks, as well as focal seizures with impaired awareness occurring 1-2 times a week and GTCs. She continues to report seizures on 4 AEDs, review of ER notes in [place] have raised the possibility of psychogenic non-epileptic events. 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 She is agreeable to increasing Keppra 500mg : Take 1 in AM, 1 at noon, 2 in PM. Continue Vimpat 100mg  BID, Lamictal 100mg  qhs, and Fycompa 8mg  qhs. She feels Lamotrigine has not really been helpful, we will plan to wean this off in the future. She is reporting more headaches with migrainous features and is agreeable to a trial of a daily preventative medication, nortriptyline 10mg  qhs. Side effects discussed. She does not drive. She will follow-up in 3-4 months and knows to call for any changes.   Follow Up Instructions:   -I discussed the assessment and treatment plan with the patient. The patient was provided an opportunity to ask questions and all were answered. The patient agreed with the plan and demonstrated an understanding of the instructions.   The patient was advised to call back or seek an in-person evaluation if the symptoms worsen or if the condition fails to improve as anticipated.  Total Time spent in visit with the patient was 25 minutes, of which more than 50% of the time was spent in counseling and/or coordinating care on the above.   Pt understands and agrees with the plan of care outlined.     Cameron Sprang, MD

## 2019-03-17 ENCOUNTER — Observation Stay (HOSPITAL_BASED_OUTPATIENT_CLINIC_OR_DEPARTMENT_OTHER)
Admission: EM | Admit: 2019-03-17 | Discharge: 2019-03-19 | Disposition: A | Discharge status: Home / Self Care | Payer: BLUE CROSS/BLUE SHIELD | Attending: General Surgery | Admitting: General Surgery

## 2019-03-17 ENCOUNTER — Emergency Department (HOSPITAL_BASED_OUTPATIENT_CLINIC_OR_DEPARTMENT_OTHER): Payer: BLUE CROSS/BLUE SHIELD

## 2019-03-17 ENCOUNTER — Other Ambulatory Visit: Payer: Self-pay

## 2019-03-17 ENCOUNTER — Encounter (HOSPITAL_BASED_OUTPATIENT_CLINIC_OR_DEPARTMENT_OTHER): Payer: Self-pay | Admitting: *Deleted

## 2019-03-17 DIAGNOSIS — M545 Low back pain, unspecified: Secondary | ICD-10-CM

## 2019-03-17 DIAGNOSIS — S060X1A Concussion with loss of consciousness of 30 minutes or less, initial encounter: Secondary | ICD-10-CM | POA: Diagnosis not present

## 2019-03-17 DIAGNOSIS — Z791 Long term (current) use of non-steroidal anti-inflammatories (NSAID): Secondary | ICD-10-CM | POA: Insufficient documentation

## 2019-03-17 DIAGNOSIS — R0602 Shortness of breath: Secondary | ICD-10-CM

## 2019-03-17 DIAGNOSIS — R0781 Pleurodynia: Secondary | ICD-10-CM | POA: Diagnosis not present

## 2019-03-17 DIAGNOSIS — Y9352 Activity, horseback riding: Secondary | ICD-10-CM | POA: Insufficient documentation

## 2019-03-17 DIAGNOSIS — R51 Headache: Secondary | ICD-10-CM | POA: Insufficient documentation

## 2019-03-17 DIAGNOSIS — R41841 Cognitive communication deficit: Secondary | ICD-10-CM | POA: Diagnosis not present

## 2019-03-17 DIAGNOSIS — Z888 Allergy status to other drugs, medicaments and biological substances status: Secondary | ICD-10-CM | POA: Diagnosis not present

## 2019-03-17 DIAGNOSIS — S299XXA Unspecified injury of thorax, initial encounter: Secondary | ICD-10-CM | POA: Diagnosis not present

## 2019-03-17 DIAGNOSIS — J982 Interstitial emphysema: Secondary | ICD-10-CM | POA: Diagnosis not present

## 2019-03-17 DIAGNOSIS — R0902 Hypoxemia: Secondary | ICD-10-CM | POA: Insufficient documentation

## 2019-03-17 DIAGNOSIS — Z91040 Latex allergy status: Secondary | ICD-10-CM | POA: Insufficient documentation

## 2019-03-17 DIAGNOSIS — Z881 Allergy status to other antibiotic agents status: Secondary | ICD-10-CM | POA: Insufficient documentation

## 2019-03-17 DIAGNOSIS — R2681 Unsteadiness on feet: Secondary | ICD-10-CM | POA: Insufficient documentation

## 2019-03-17 DIAGNOSIS — G629 Polyneuropathy, unspecified: Secondary | ICD-10-CM | POA: Insufficient documentation

## 2019-03-17 DIAGNOSIS — Z880 Allergy status to penicillin: Secondary | ICD-10-CM | POA: Diagnosis not present

## 2019-03-17 DIAGNOSIS — S3991XA Unspecified injury of abdomen, initial encounter: Secondary | ICD-10-CM | POA: Diagnosis not present

## 2019-03-17 DIAGNOSIS — R42 Dizziness and giddiness: Secondary | ICD-10-CM | POA: Diagnosis not present

## 2019-03-17 DIAGNOSIS — S3992XA Unspecified injury of lower back, initial encounter: Secondary | ICD-10-CM | POA: Diagnosis not present

## 2019-03-17 DIAGNOSIS — M25551 Pain in right hip: Secondary | ICD-10-CM | POA: Diagnosis not present

## 2019-03-17 DIAGNOSIS — Z79899 Other long term (current) drug therapy: Secondary | ICD-10-CM | POA: Insufficient documentation

## 2019-03-17 DIAGNOSIS — S79911A Unspecified injury of right hip, initial encounter: Secondary | ICD-10-CM | POA: Diagnosis not present

## 2019-03-17 DIAGNOSIS — R2689 Other abnormalities of gait and mobility: Secondary | ICD-10-CM | POA: Insufficient documentation

## 2019-03-17 DIAGNOSIS — G40804 Other epilepsy, intractable, without status epilepticus: Secondary | ICD-10-CM | POA: Diagnosis not present

## 2019-03-17 LAB — CBC WITH DIFFERENTIAL/PLATELET
Abs Immature Granulocytes: 0.03 10*3/uL (ref 0.00–0.07)
Basophils Absolute: 0.1 10*3/uL (ref 0.0–0.1)
Basophils Relative: 0 %
Eosinophils Absolute: 0.3 10*3/uL (ref 0.0–0.5)
Eosinophils Relative: 2 %
HCT: 35.1 % — ABNORMAL LOW (ref 36.0–46.0)
Hemoglobin: 11.7 g/dL — ABNORMAL LOW (ref 12.0–15.0)
Immature Granulocytes: 0 %
Lymphocytes Relative: 27 %
Lymphs Abs: 3.1 10*3/uL (ref 0.7–4.0)
MCH: 29 pg (ref 26.0–34.0)
MCHC: 33.3 g/dL (ref 30.0–36.0)
MCV: 86.9 fL (ref 80.0–100.0)
Monocytes Absolute: 1.1 10*3/uL — ABNORMAL HIGH (ref 0.1–1.0)
Monocytes Relative: 9 %
Neutro Abs: 7 10*3/uL (ref 1.7–7.7)
Neutrophils Relative %: 62 %
Platelets: 331 10*3/uL (ref 150–400)
RBC: 4.04 MIL/uL (ref 3.87–5.11)
RDW: 12.4 % (ref 11.5–15.5)
WBC: 11.5 10*3/uL — ABNORMAL HIGH (ref 4.0–10.5)
nRBC: 0 % (ref 0.0–0.2)

## 2019-03-17 LAB — PREGNANCY, URINE: Preg Test, Ur: NEGATIVE

## 2019-03-17 LAB — COMPREHENSIVE METABOLIC PANEL
ALT: 13 U/L (ref 0–44)
AST: 20 U/L (ref 15–41)
Albumin: 4 g/dL (ref 3.5–5.0)
Alkaline Phosphatase: 48 U/L (ref 38–126)
Anion gap: 8 (ref 5–15)
BUN: 9 mg/dL (ref 6–20)
CO2: 21 mmol/L — ABNORMAL LOW (ref 22–32)
Calcium: 8.9 mg/dL (ref 8.9–10.3)
Chloride: 107 mmol/L (ref 98–111)
Creatinine, Ser: 0.5 mg/dL (ref 0.44–1.00)
GFR calc Af Amer: >60 mL/min (ref >60)
GFR calc non Af Amer: >60 mL/min (ref >60)
Glucose, Bld: 100 mg/dL — ABNORMAL HIGH (ref 70–99)
Potassium: 3.3 mmol/L — ABNORMAL LOW (ref 3.5–5.1)
Sodium: 136 mmol/L (ref 135–145)
Total Bilirubin: 0.5 mg/dL (ref 0.3–1.2)
Total Protein: 6.4 g/dL — ABNORMAL LOW (ref 6.5–8.1)

## 2019-03-17 LAB — URINALYSIS, ROUTINE W REFLEX MICROSCOPIC
Bilirubin Urine: NEGATIVE
Glucose, UA: NEGATIVE mg/dL
Hgb urine dipstick: NEGATIVE
Ketones, ur: NEGATIVE mg/dL
Leukocytes,Ua: NEGATIVE
Nitrite: NEGATIVE
Protein, ur: NEGATIVE mg/dL
Specific Gravity, Urine: 1.01 (ref 1.005–1.030)
pH: 8.5 — ABNORMAL HIGH (ref 5.0–8.0)

## 2019-03-17 LAB — LIPASE, BLOOD: Lipase: 25 U/L (ref 11–51)

## 2019-03-17 MED ORDER — LORAZEPAM 2 MG/ML IJ SOLN
0.5000 mg | Freq: Once | INTRAMUSCULAR | Status: AC
  Administered 2019-03-17: 0.5 mg via INTRAVENOUS

## 2019-03-17 MED ORDER — LEVETIRACETAM IN NACL 1000 MG/100ML IV SOLN
1000.0000 mg | Freq: Once | INTRAVENOUS | Status: AC
  Administered 2019-03-17: 1000 mg via INTRAVENOUS
  Filled 2019-03-17: qty 100

## 2019-03-17 MED ORDER — SODIUM CHLORIDE 0.9 % IV SOLN
Freq: Once | INTRAVENOUS | Status: AC
  Administered 2019-03-17: 19:00:00 via INTRAVENOUS

## 2019-03-17 MED ORDER — MORPHINE SULFATE (PF) 4 MG/ML IV SOLN
4.0000 mg | Freq: Once | INTRAVENOUS | Status: AC
  Administered 2019-03-17: 4 mg via INTRAVENOUS
  Filled 2019-03-17: qty 1

## 2019-03-17 MED ORDER — LORAZEPAM 2 MG/ML IJ SOLN
INTRAMUSCULAR | Status: AC
  Filled 2019-03-17: qty 1

## 2019-03-17 MED ORDER — MORPHINE SULFATE (PF) 2 MG/ML IV SOLN
2.0000 mg | Freq: Once | INTRAVENOUS | Status: AC
  Administered 2019-03-17: 2 mg via INTRAVENOUS
  Filled 2019-03-17: qty 1

## 2019-03-17 MED ORDER — FENTANYL CITRATE (PF) 100 MCG/2ML IJ SOLN
50.0000 ug | Freq: Once | INTRAMUSCULAR | Status: AC
  Administered 2019-03-17: 50 ug via INTRAVENOUS
  Filled 2019-03-17: qty 2

## 2019-03-17 MED ORDER — IOHEXOL 300 MG/ML  SOLN
100.0000 mL | Freq: Once | INTRAMUSCULAR | Status: AC | PRN
  Administered 2019-03-17: 100 mL via INTRAVENOUS

## 2019-03-17 NOTE — ED Triage Notes (Signed)
She was thrown from her horse this am landing on her right side. She had LOC. She went home after the fall and has gotten progressively SOB throughout the day.

## 2019-03-17 NOTE — ED Notes (Signed)
Pt transported to CT at this time.
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
## 2019-03-17 NOTE — ED Notes (Signed)
Pt ambulated to restroom with minimal assistance.

## 2019-03-17 NOTE — ED Notes (Signed)
Pt states that she feels like the pain in her chest is worsening which is making it more difficult to breath. EDP notified, see new orders.

## 2019-03-17 NOTE — ED Provider Notes (Signed)
Care handoff received from [facility] at shift change.  Please see her note for full details.  In short patient presenting after fall from her horse today.  Additionally with seizure approximately 30 minutes after her fall, she has known seizure history.  She has become increasingly short of breath since that time.  She has been ambulatory since the injury. Physical Exam  BP (!) 103/59   Pulse 66   Temp 98 F (36.7 C) (Oral)   Resp 20   Ht 5\' 4"  (1.626 m)   Wt 48.5 kg   SpO2 97%   BMI 18.37 kg/m   Physical Exam Constitutional:      General: She is not in acute distress.    Appearance: She is well-developed. She is ill-appearing (Uncomfortable appearing). She is not toxic-appearing.  HENT:     Head: Normocephalic and atraumatic.     Mouth/Throat:     Mouth: Mucous membranes are moist.     Pharynx: Oropharynx is clear.  Eyes:     Extraocular Movements: Extraocular movements intact.     Pupils: Pupils are equal, round, and reactive to light.  Neck:     Musculoskeletal: Normal range of motion and neck supple.     Trachea: No tracheal deviation.  Cardiovascular:     Rate and Rhythm: Normal rate and regular rhythm.     Pulses:          Dorsalis pedis pulses are 2+ on the right side and 2+ on the left side.       Posterior tibial pulses are 2+ on the right side and 2+ on the left side.  Pulmonary:     Effort: Pulmonary effort is normal. No accessory muscle usage or respiratory distress.     Breath sounds: Normal breath sounds and air entry. No decreased breath sounds.  Chest:     Chest wall: Tenderness present. No deformity or crepitus.     Comments: No subcutaneous emphysema noted on examination Abdominal:     General: Bowel sounds are normal. There is no distension.     Palpations: Abdomen is soft.     Tenderness: There is no abdominal tenderness. There is no guarding or rebound.  Musculoskeletal: Normal range of motion.     Comments: Patient moving all extremities  spontaneously.  Feet:     Right foot:     Protective Sensation: 5 sites tested. 5 sites sensed.     Left foot:     Protective Sensation: 5 sites tested. 5 sites sensed.  Skin:    General: Skin is warm and dry.     Capillary Refill: Capillary refill takes less than 2 seconds.  Neurological:     General: No focal deficit present.     Mental Status: She is alert.     GCS: GCS eye subscore is 4. GCS verbal subscore is 5. GCS motor subscore is 6.     Comments: Patient reports paresthesias about the right anterior thigh, she has sensation intact to this area.  On my examination patient with 5/5 strength to dorsi and plantar flexion bilaterally.  She does have some decreased strength compared to left with right hip flexion.  Psychiatric:        Mood and Affect: Mood normal.        Behavior: Behavior normal.    ED Course/Procedures     Procedures  MDM  CBC unremarkable CMP unremarkable Lipase within normal limits Urine pregnancy negative Urinalysis unremarkable  DG Chest #1:  IMPRESSION:  No active disease.   DG Hips:  IMPRESSION:  Negative.   CT Head/Cervical Spine:  IMPRESSION:  1. Normal unenhanced CT of the brain.  2. No acute/traumatic cervical spine pathology.  3. Pneumomediastinum of indeterminate etiology. Traumatic injury to  the esophagus or other intrathoracic structures are not excluded.  Please refer to the findings of chest CT.   CT Chest/Abd/Pelvis/Lspine:  IMPRESSION:  CT CHEST:    1. Small volume pneumomediastinum. No lung contusion or rib  fractures.    CT ABDOMEN AND PELVIS:    1. No CT findings of acute trauma. No acute intra-abdominopelvic  process.    CT lumbar spine:    1. No acute fracture.  2. Grade 1 L5-S1 anterolisthesis on the basis of chronic bilateral  L5 pars interarticularis defects.   Discussed case with Dr. Reather Converse will consult trauma. VSS.  Discussed case with Dr. Georgette Dover from trauma team.  Patient may follow-up with her  PCP.  Return to [facility] for any worsening of symptoms.  No further follow-up advised. - Patient with continued chest pain or shortness of breath additional morphine ordered.  I was again called to the room by nursing staff patient has had increased pain despite 6 mg of morphine.  SPO2 has decreased to 89% on room air, and she was placed on 2 L nasal cannula by nursing staff.  Portable chest x-ray was repeated and without change.    CXR #2:  IMPRESSION:  No acute cardiopulmonary process. Nonvisualization of the  pneumomediastinum on radiograph.   Consult called to trauma discussed case with Dr. Georgette Dover, plan to admit for observation.  Patient is now resting comfortably in no acute distress hypoxia or tachycardia, she states understanding of care plan and is agreeable. - Additionally patient is still with paresthesias of the right anterior thigh, she has sensation intact in this area, she has 5/5 strength to dorsi and plantarflexion of the right lower extremity compared to left however she has decreased strength with hip flexion.  Discussed with Dr. Gershon Crane.  Note: Portions of this report may have been transcribed using voice recognition software. Every effort was made to ensure accuracy; however, inadvertent computerized transcription errors may still be present.   Gari Crown 03/17/19 2352    Elnora Morrison, MD 03/21/19 [phone]

## 2019-03-17 NOTE — H&P (Signed)
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 There is no evidence of arthropathy or other focal bone abnormality. IMPRESSION: Negative. Electronically Signed   By: Jeannine Boga M.D.   On: 03/17/2019 20:51    Review of Systems  Constitutional: Negative for weight loss.  HENT: Negative for ear discharge, ear pain, hearing loss and tinnitus.   Eyes: Negative for blurred vision, double vision, photophobia and pain.  Respiratory: Positive for shortness of breath. Negative for cough and sputum production.   Cardiovascular: Negative for chest pain.  Gastrointestinal: Positive for abdominal pain. Negative for nausea and vomiting.  Genitourinary: Negative for dysuria, flank pain, frequency and urgency.  Musculoskeletal: Positive for back pain. Negative for falls, joint pain, myalgias and neck pain.  Neurological: Positive for seizures and headaches. Negative for dizziness, tingling, sensory change, focal weakness and loss of consciousness.  Endo/Heme/Allergies: Does not  bruise/bleed easily.  Psychiatric/Behavioral: Negative for depression, memory loss and substance abuse. The patient is not nervous/anxious.    Per EDP note     Blood pressure (!) 110/59, pulse 65, temperature 98 F (36.7 C), temperature source Oral, resp. rate 13, height 5\' 4"  (1.626 m), weight 48.5 kg, SpO2 100 %. Physical Exam  Waiting on patient to arrive  Assessment/Plan 1.  Thrown from horse 2.  Pneumomediastinum 3.  Transient hypoxemia 4.  Paresthesias of right thigh  Transfer to [facility] [place] Nasal cannula oxygen Pulse oximetry Incentive spirometry Neuro exam in AM Repeat CXR in AM  Imogene Burn Ramzi Brathwaite 03/17/2019, 11:36 PM   Procedures

## 2019-03-18 ENCOUNTER — Observation Stay (HOSPITAL_COMMUNITY): Payer: BLUE CROSS/BLUE SHIELD

## 2019-03-18 DIAGNOSIS — R569 Unspecified convulsions: Secondary | ICD-10-CM | POA: Diagnosis not present

## 2019-03-18 DIAGNOSIS — Z880 Allergy status to penicillin: Secondary | ICD-10-CM | POA: Diagnosis not present

## 2019-03-18 DIAGNOSIS — R41841 Cognitive communication deficit: Secondary | ICD-10-CM | POA: Diagnosis not present

## 2019-03-18 DIAGNOSIS — J982 Interstitial emphysema: Secondary | ICD-10-CM | POA: Diagnosis not present

## 2019-03-18 DIAGNOSIS — R2681 Unsteadiness on feet: Secondary | ICD-10-CM | POA: Diagnosis not present

## 2019-03-18 DIAGNOSIS — G629 Polyneuropathy, unspecified: Secondary | ICD-10-CM | POA: Diagnosis not present

## 2019-03-18 DIAGNOSIS — Z888 Allergy status to other drugs, medicaments and biological substances status: Secondary | ICD-10-CM | POA: Diagnosis not present

## 2019-03-18 DIAGNOSIS — Z881 Allergy status to other antibiotic agents status: Secondary | ICD-10-CM | POA: Diagnosis not present

## 2019-03-18 DIAGNOSIS — R0902 Hypoxemia: Secondary | ICD-10-CM | POA: Diagnosis not present

## 2019-03-18 DIAGNOSIS — R51 Headache: Secondary | ICD-10-CM | POA: Diagnosis not present

## 2019-03-18 DIAGNOSIS — T797XXA Traumatic subcutaneous emphysema, initial encounter: Secondary | ICD-10-CM | POA: Diagnosis not present

## 2019-03-18 DIAGNOSIS — M545 Low back pain: Secondary | ICD-10-CM | POA: Diagnosis present

## 2019-03-18 DIAGNOSIS — Z79899 Other long term (current) drug therapy: Secondary | ICD-10-CM | POA: Diagnosis not present

## 2019-03-18 DIAGNOSIS — S060X1A Concussion with loss of consciousness of 30 minutes or less, initial encounter: Secondary | ICD-10-CM | POA: Diagnosis not present

## 2019-03-18 DIAGNOSIS — Z791 Long term (current) use of non-steroidal anti-inflammatories (NSAID): Secondary | ICD-10-CM | POA: Diagnosis not present

## 2019-03-18 DIAGNOSIS — Z91040 Latex allergy status: Secondary | ICD-10-CM | POA: Diagnosis not present

## 2019-03-18 DIAGNOSIS — Y9352 Activity, horseback riding: Secondary | ICD-10-CM | POA: Diagnosis not present

## 2019-03-18 DIAGNOSIS — R2689 Other abnormalities of gait and mobility: Secondary | ICD-10-CM | POA: Diagnosis not present

## 2019-03-18 DIAGNOSIS — R0602 Shortness of breath: Secondary | ICD-10-CM | POA: Diagnosis present

## 2019-03-18 DIAGNOSIS — G40804 Other epilepsy, intractable, without status epilepticus: Secondary | ICD-10-CM | POA: Diagnosis not present

## 2019-03-18 LAB — HIV ANTIBODY (ROUTINE TESTING W REFLEX): HIV Screen 4th Generation wRfx: NONREACTIVE

## 2019-03-18 MED ORDER — OXYCODONE HCL 5 MG PO TABS
10.0000 mg | ORAL_TABLET | ORAL | Status: DC | PRN
  Administered 2019-03-18: 10 mg via ORAL
  Filled 2019-03-18: qty 2

## 2019-03-18 MED ORDER — MORPHINE SULFATE (PF) 4 MG/ML IV SOLN
4.0000 mg | INTRAVENOUS | Status: DC | PRN
  Administered 2019-03-18: 4 mg via INTRAVENOUS
  Filled 2019-03-18: qty 1

## 2019-03-18 MED ORDER — PERAMPANEL 4 MG PO TABS
2.0000 | ORAL_TABLET | Freq: Every evening | ORAL | Status: DC
  Administered 2019-03-18: 8 mg via ORAL
  Filled 2019-03-18: qty 2

## 2019-03-18 MED ORDER — LACOSAMIDE 50 MG PO TABS
100.0000 mg | ORAL_TABLET | Freq: Two times a day (BID) | ORAL | Status: DC
  Administered 2019-03-19: 100 mg via ORAL
  Filled 2019-03-18 (×3): qty 2

## 2019-03-18 MED ORDER — LEVETIRACETAM 500 MG PO TABS
1000.0000 mg | ORAL_TABLET | Freq: Every day | ORAL | Status: DC
  Filled 2019-03-18: qty 2

## 2019-03-18 MED ORDER — LAMOTRIGINE 100 MG PO TABS
100.0000 mg | ORAL_TABLET | Freq: Every day | ORAL | Status: DC
  Filled 2019-03-18: qty 1

## 2019-03-18 MED ORDER — LEVETIRACETAM 500 MG PO TABS
500.0000 mg | ORAL_TABLET | ORAL | Status: DC
  Administered 2019-03-18 – 2019-03-19 (×2): 500 mg via ORAL
  Filled 2019-03-18 (×3): qty 1

## 2019-03-18 MED ORDER — MORPHINE SULFATE (PF) 2 MG/ML IV SOLN: 2.0000 mg | INTRAVENOUS | Status: DC | PRN

## 2019-03-18 MED ORDER — ENOXAPARIN SODIUM 40 MG/0.4ML ~~LOC~~ SOLN
40.0000 mg | SUBCUTANEOUS | Status: DC
  Administered 2019-03-18: 40 mg via SUBCUTANEOUS
  Filled 2019-03-18: qty 0.4

## 2019-03-18 MED ORDER — TRIAMCINOLONE ACETONIDE 0.1 % EX LOTN: 1.0000 "application " | TOPICAL_LOTION | Freq: Two times a day (BID) | CUTANEOUS | Status: DC

## 2019-03-18 MED ORDER — IBUPROFEN 600 MG PO TABS: 600.0000 mg | ORAL_TABLET | Freq: Three times a day (TID) | ORAL | Status: DC | PRN

## 2019-03-18 MED ORDER — CLINDAMYCIN PHOSPHATE 1 % EX LOTN: 1.0000 "application " | TOPICAL_LOTION | CUTANEOUS | Status: DC

## 2019-03-18 MED ORDER — ONDANSETRON 4 MG PO TBDP: 4.0000 mg | ORAL_TABLET | Freq: Four times a day (QID) | ORAL | Status: DC | PRN

## 2019-03-18 MED ORDER — ONDANSETRON HCL 4 MG/2ML IJ SOLN
4.0000 mg | Freq: Four times a day (QID) | INTRAMUSCULAR | Status: DC | PRN
  Administered 2019-03-18: 4 mg via INTRAVENOUS
  Filled 2019-03-18: qty 2

## 2019-03-18 MED ORDER — NORTRIPTYLINE HCL 10 MG PO CAPS
10.0000 mg | ORAL_CAPSULE | Freq: Every day | ORAL | Status: DC
  Filled 2019-03-18 (×2): qty 1

## 2019-03-18 MED ORDER — ACETAMINOPHEN 325 MG PO TABS
650.0000 mg | ORAL_TABLET | ORAL | Status: DC
  Administered 2019-03-18 (×3): 650 mg via ORAL
  Filled 2019-03-18 (×4): qty 2

## 2019-03-18 MED ORDER — CLINDAMYCIN PHOS-BENZOYL PEROX 1-5 % EX GEL: 1.0000 "application " | CUTANEOUS | Status: DC

## 2019-03-18 MED ORDER — OXYCODONE HCL 5 MG PO TABS: 5.0000 mg | ORAL_TABLET | ORAL | Status: DC | PRN

## 2019-03-18 MED ORDER — SODIUM CHLORIDE 0.9% FLUSH
10.0000 mL | INTRAVENOUS | Status: DC | PRN
  Administered 2019-03-19: 10 mL
  Filled 2019-03-18: qty 40

## 2019-03-18 MED ORDER — MORPHINE SULFATE (PF) 4 MG/ML IV SOLN
4.0000 mg | Freq: Once | INTRAVENOUS | Status: AC
  Administered 2019-03-18: 4 mg via INTRAVENOUS
  Filled 2019-03-18: qty 1

## 2019-03-18 MED ORDER — SODIUM CHLORIDE 0.9 % IV SOLN
INTRAVENOUS | Status: DC
  Administered 2019-03-18 (×2): via INTRAVENOUS

## 2019-03-18 MED ORDER — TRETINOIN 0.025 % EX CREA: 1.0000 "application " | TOPICAL_CREAM | CUTANEOUS | Status: DC

## 2019-03-18 MED ORDER — LORAZEPAM 0.5 MG PO TABS: 0.5000 mg | ORAL_TABLET | Freq: Three times a day (TID) | ORAL | Status: DC | PRN
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
## 2019-03-18 NOTE — ED Notes (Signed)
[facility] notified (Kim) - patient ready for transport 

## 2019-03-18 NOTE — Progress Notes (Signed)
Home meds that were in a plastic bag in original bottles counted and taken to pharmacy, verified by pharmacist and they will dispense from there. White copy of list placed on chart.

## 2019-03-18 NOTE — TOC Initial Note (Signed)
Transition of Care [facility]) - Initial/Assessment Note    Patient Details  Name: [name] MRN: [id number] Date of Birth: January 30, 1991  Transition of Care [facility]) CM/SW Contact:    Geralynn Ochs, LCSW Phone Number: 03/18/2019, 2:53 PM  Clinical Narrative:  Patient from home, full time student at [employer]. Mother is available at home for assist. SBIRT completed. 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 Patient hopeful that her discharge paperwork will provide her flexibility. CSW also encouraged patient to ask MD for a letter if he was worried about how her school would handle her situation.                  Expected Discharge Plan: Home/Self Care Barriers to Discharge: Continued Medical Work up   Patient Goals and CMS Choice Patient states their goals for this hospitalization and ongoing recovery are:: be able to get back to doing school work      Expected Discharge Plan and Services Expected Discharge Plan: Home/Self Care       Living arrangements for the past 2 months: Single Family Home                          Prior Living Arrangements/Services Living arrangements for the past 2 months: Single Family Home Lives with:: Self, Parents Patient language and need for interpreter reviewed:: No Do you feel safe going back to the place where you live?: Yes      Need for Family Participation in Patient Care: No (Comment) Care giver support system in place?: Yes (comment)   Criminal Activity/Legal Involvement Pertinent to Current Situation/Hospitalization: No - Comment as needed  Activities of Daily Living      Permission Sought/Granted                  Emotional Assessment Appearance:: Appears stated age Attitude/Demeanor/Rapport: Engaged Affect (typically observed): Appropriate, Pleasant Orientation: : Oriented to Self, Oriented to Place, Oriented to   Time, Oriented to Situation Alcohol / Substance Use: Never Used Psych Involvement: No (comment)  Admission diagnosis:  Shortness of breath [R06.02] Pneumomediastinum (H[facility]) [J98.2] Lumbar pain [M54.5] Fall from horse, initial encounter [V80.010A] Patient Active Problem List   Diagnosis Date Noted  . Pneumomediastinum ([place]) 03/17/2019  . Seizure ([place]) 10/05/2018  . 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 Syncope 05/21/2018  . Seizures ([place]) 12/04/2017  . Cellulitis of chest wall 10/07/2017  . Infected venous access port, initial encounter 10/07/2017  . 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 Partial idiopathic epilepsy with seizures of localized onset, intractable, without status epilepticus ([place]) 07/12/2015  . Hypoglycemia 06/12/2014  . Migraine, unspecified, without mention of intractable migraine without mention of status migrainosus 06/29/2013  . Asthma 11/27/2011  . Muscle weakness of lower extremity 11/26/2011  . Acute appendicitis with localized peritonitis 11/26/2011  . 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 Seizure disorder ([place]) 08/16/2009  . HEARING LOSS, BILATERAL 05/25/2008   PCP:  Dorothyann Peng, NP Pharmacy:   [id number] [place], [address] [place] [address] Phone: [phone] Fax: [phone]     Social Determinants of Health (SDOH) Interventions    Readmission Risk Interventions No flowsheet data found.

## 2019-03-18 NOTE — Progress Notes (Signed)
Pt ambulates in the hallway without oxygen o2sat 95-98% and observed on room air 98-100% 02sat but whenever she stood up prior ambulating or use bedside commode she felt dizzy and unstable.

## 2019-03-18 NOTE — Progress Notes (Signed)
Subjective: Patient very sore all over. Complains of trouble voiding, starting her stream since she fell.  States that she had some blood on her TP when she wiped and she is not menstruating.  States she feels short of breath, but mostly because it hurts to breath.  Objective: Vital signs in last 24 hours: Temp:  [97.8 F (36.6 C)-98.5 F (36.9 C)] 98.5 F (36.9 C) (04/03 0445) Pulse Rate:  [56-107] 68 (04/03 0445) Resp:  [13-23] 18 (04/03 0445) BP: (103-122)/(59-76) 112/74 (04/03 0445) SpO2:  [97 %-100 %] 100 % (04/03 0445) Weight:  [48.5 kg-52.1 kg] 52.1 kg (04/03 0146) Last BM Date: 03/17/19  Intake/Output from previous day: 04/02 0701 - 04/03 0700 In: 468.5 [P.O.:120; I.V.:246.2; IV Piggyback:102.3] Out: 100 [Urine:100] Intake/Output this shift: No intake/output data recorded.  PE: Gen: NAD Heart: regular Lungs: CTAB, O2 sats between 98-100% on RA.  PAC in place in RUC Abd: soft, minimally tender, +BS, ND GU: normal female genitalia, no evidence of laceration, abrasion, or old blood Ext: MAE, NVI   Lab Results:  Recent Labs    03/17/19 1829  WBC 11.5*  HGB 11.7*  HCT 35.1*  PLT 331   BMET Recent Labs    03/17/19 1829  NA 136  K 3.3*  CL 107  CO2 21*  GLUCOSE 100*  BUN 9  CREATININE 0.50  CALCIUM 8.9   PT/INR No results for input(s): LABPROT, INR in the last 72 hours. CMP     Component Value Date/Time   NA 136 03/17/2019 1829   NA 137 06/29/2013 1437   K 3.3 (L) 03/17/2019 1829   CL 107 03/17/2019 1829   CO2 21 (L) 03/17/2019 1829   GLUCOSE 100 (H) 03/17/2019 1829   BUN 9 03/17/2019 1829   BUN 17 06/29/2013 1437   CREATININE 0.50 03/17/2019 1829   CALCIUM 8.9 03/17/2019 1829   PROT 6.4 (L) 03/17/2019 1829   PROT 7.3 06/29/2013 1437   ALBUMIN 4.0 03/17/2019 1829   ALBUMIN 4.8 06/29/2013 1437   AST 20 03/17/2019 1829   ALT 13 03/17/2019 1829   ALKPHOS 48 03/17/2019 1829   BILITOT 0.5 03/17/2019 1829   GFRNONAA >60 03/17/2019 1829    GFRAA >60 03/17/2019 1829   Lipase     Component Value Date/Time   LIPASE 25 03/17/2019 1829       Studies/Results: Dg Chest 2 View  Result Date: 03/18/2019 CLINICAL DATA:  Pneumomediastinum after fall from horse. EXAM: CHEST - 2 VIEW COMPARISON:  Yesterday FINDINGS: Porta catheter on the right in stable position. No convincing pneumomediastinum (retrosternal lucency on the lateral view is more likely related to rib interface). No pneumothorax or pulmonary opacification. IMPRESSION: Negative chest. Pneumomediastinum by CT remains radiographically occult. Electronically Signed   By: Monte Fantasia M.D.   On: 03/18/2019 06:12   Ct Head Wo Contrast  Result Date: 03/17/2019 CLINICAL DATA:  28 year old female with history of seizures presenting with headache EXAM: CT HEAD WITHOUT CONTRAST CT CERVICAL SPINE WITHOUT CONTRAST TECHNIQUE: Multidetector CT imaging of the head and cervical spine was performed following the standard protocol without intravenous contrast. Multiplanar CT image reconstructions of the cervical spine were also generated. COMPARISON:  Brain MRI and head CT dated 04/17/2011 FINDINGS: CT HEAD FINDINGS Brain: No evidence of acute infarction, hemorrhage, hydrocephalus, extra-axial collection or mass lesion/mass effect. Vascular: No hyperdense vessel or unexpected calcification. Skull: Normal. Negative for fracture or focal lesion. Sinuses/Orbits: No acute finding. Other: None CT CERVICAL  SPINE FINDINGS Alignment: No acute subluxation. There is straightening of the cervical lordosis which may be positional or due to muscle spasm. Skull base and vertebrae: No acute fracture. Soft tissues and spinal canal: No prevertebral fluid or swelling. No visible canal hematoma. Disc levels: No acute findings. No significant degenerative changes. Upper chest: There is pneumomediastinum in the visualized upper mediastinum and posterior to the esophagus of indeterminate etiology. Traumatic injury to  the esophagus or other intrathoracic structures are not excluded. Other: Partially visualized Port-A-Cath tube in the right IJ. IMPRESSION: 1. Normal unenhanced CT of the brain. 2. No acute/traumatic cervical spine pathology. 3. Pneumomediastinum of indeterminate etiology. Traumatic injury to the esophagus or other intrathoracic structures are not excluded. Please refer to the findings of chest CT. Electronically Signed   By: Anner Crete M.D.   On: 03/17/2019 21:30   Ct Chest W Contrast  Result Date: 03/17/2019 CLINICAL DATA:  Back pain and RIGHT hip pain after fall from horse today. Shortness of breath and rib pain. EXAM: CT CHEST, ABDOMEN, AND PELVIS WITH CONTRAST CT LUMBAR SPINE WITHOUT CONTRAST TECHNIQUE: Multidetector CT imaging of the chest, abdomen and pelvis was performed following the standard protocol during bolus administration of intravenous contrast. Multiplanar reconstructed images of the lumbar spine. CONTRAST:  120mL OMNIPAQUE IOHEXOL 300 MG/ML  SOLN COMPARISON:  Chest radiograph March 17, 2019 FINDINGS: CT CHEST FINDINGS CARDIOVASCULAR: Heart size is normal. No pericardial effusion. Thoracic aorta is normal course and caliber, unremarkable. MEDIASTINUM/NODES: Small volume pneumomediastinum slightly tracking to RIGHT posterior pleural space. No lymphadenopathy by CT size criteria. Normal appearance of thoracic esophagus though not tailored for evaluation. RIGHT chest Port-A-Cath. Minimal residual thymus. LUNGS/PLEURA: Tracheobronchial tree is patent, no pneumothorax. No pleural effusions, focal consolidations, pulmonary nodules or masses. MUSCULOSKELETAL: Non-acute. CT ABDOMEN AND PELVIS FINDINGS HEPATOBILIARY: Status post cholecystectomy.  Normal liver. PANCREAS: Normal. SPLEEN: Normal. ADRENALS/URINARY TRACT: Kidneys are orthotopic, demonstrating symmetric enhancement. No nephrolithiasis, hydronephrosis or solid renal masses. The unopacified ureters are normal in course and caliber. Delayed  imaging through the kidneys demonstrates symmetric prompt contrast excretion within the proximal urinary collecting system. Urinary bladder is partially distended and unremarkable. Normal adrenal glands. STOMACH/BOWEL: The stomach, small and large bowel are normal in course and caliber without inflammatory changes. Status post appendectomy. VASCULAR/LYMPHATIC: Aortoiliac vessels are normal in course and caliber. No lymphadenopathy by CT size criteria. REPRODUCTIVE: Normal.  RIGHT ovarian dominant follicle. OTHER: Small volume low-density free fluid in pelvis within physiologic range. No intraperitoneal free air or focal fluid collections. MUSCULOSKELETAL: Non-acute. CT LUMBAR SPINE FINDINGS SEGMENTATION: For the purposes of this report the last well-formed intervertebral disc space is reported as L5-S1. ALIGNMENT: Maintained lumbar lordosis. Minimal grade 1 L5-S1 anterolisthesis on the basis of chronic bilateral L5 pars interarticularis defects. Mild broad levoscoliosis. VERTEBRAE: Vertebral bodies and posterior elements are intact. Intervertebral disc heights preserved mild annular calcification at L4-5. No destructive bony lesions. PARASPINAL AND OTHER SOFT TISSUES: Included prevertebral and paraspinal soft tissues are unremarkable. DISC LEVELS: No disc bulge, canal stenosis nor neural foraminal narrowing. IMPRESSION: CT CHEST: 1. Small volume pneumomediastinum. No lung contusion or rib fractures. CT ABDOMEN AND PELVIS: 1. No CT findings of acute trauma. No acute intra-abdominopelvic process. CT lumbar spine: 1. No acute fracture. 2. Grade 1 L5-S1 anterolisthesis on the basis of chronic bilateral L5 pars interarticularis defects. Acute findings discussed with and reconfirmed by PA Brandon on 03/17/2019 at 9:30 pm. Electronically Signed   By: Elon Alas M.D.   On: 03/17/2019 21:31  Ct Cervical Spine Wo Contrast  Result Date: 03/17/2019 CLINICAL DATA:  28 year old female with history of seizures presenting  with headache EXAM: CT HEAD WITHOUT CONTRAST CT CERVICAL SPINE WITHOUT CONTRAST TECHNIQUE: Multidetector CT imaging of the head and cervical spine was performed following the standard protocol without intravenous contrast. Multiplanar CT image reconstructions of the cervical spine were also generated. COMPARISON:  Brain MRI and head CT dated 04/17/2011 FINDINGS: CT HEAD FINDINGS Brain: No evidence of acute infarction, hemorrhage, hydrocephalus, extra-axial collection or mass lesion/mass effect. Vascular: No hyperdense vessel or unexpected calcification. Skull: Normal. Negative for fracture or focal lesion. Sinuses/Orbits: No acute finding. Other: None CT CERVICAL SPINE FINDINGS Alignment: No acute subluxation. There is straightening of the cervical lordosis which may be positional or due to muscle spasm. Skull base and vertebrae: No acute fracture. Soft tissues and spinal canal: No prevertebral fluid or swelling. No visible canal hematoma. Disc levels: No acute findings. No significant degenerative changes. Upper chest: There is pneumomediastinum in the visualized upper mediastinum and posterior to the esophagus of indeterminate etiology. Traumatic injury to the esophagus or other intrathoracic structures are not excluded. Other: Partially visualized Port-A-Cath tube in the right IJ. IMPRESSION: 1. Normal unenhanced CT of the brain. 2. No acute/traumatic cervical spine pathology. 3. Pneumomediastinum of indeterminate etiology. Traumatic injury to the esophagus or other intrathoracic structures are not excluded. Please refer to the findings of chest CT. Electronically Signed   By: Anner Crete M.D.   On: 03/17/2019 21:30   Ct Abdomen Pelvis W Contrast  Result Date: 03/17/2019 CLINICAL DATA:  Back pain and RIGHT hip pain after fall from horse today. Shortness of breath and rib pain. EXAM: CT CHEST, ABDOMEN, AND PELVIS WITH CONTRAST CT LUMBAR SPINE WITHOUT CONTRAST TECHNIQUE: Multidetector CT imaging of the  chest, abdomen and pelvis was performed following the standard protocol during bolus administration of intravenous contrast. Multiplanar reconstructed images of the lumbar spine. CONTRAST:  17mL OMNIPAQUE IOHEXOL 300 MG/ML  SOLN COMPARISON:  Chest radiograph March 17, 2019 FINDINGS: CT CHEST FINDINGS CARDIOVASCULAR: Heart size is normal. No pericardial effusion. Thoracic aorta is normal course and caliber, unremarkable. MEDIASTINUM/NODES: Small volume pneumomediastinum slightly tracking to RIGHT posterior pleural space. No lymphadenopathy by CT size criteria. Normal appearance of thoracic esophagus though not tailored for evaluation. RIGHT chest Port-A-Cath. Minimal residual thymus. LUNGS/PLEURA: Tracheobronchial tree is patent, no pneumothorax. No pleural effusions, focal consolidations, pulmonary nodules or masses. MUSCULOSKELETAL: Non-acute. CT ABDOMEN AND PELVIS FINDINGS HEPATOBILIARY: Status post cholecystectomy.  Normal liver. PANCREAS: Normal. SPLEEN: Normal. ADRENALS/URINARY TRACT: Kidneys are orthotopic, demonstrating symmetric enhancement. No nephrolithiasis, hydronephrosis or solid renal masses. The unopacified ureters are normal in course and caliber. Delayed imaging through the kidneys demonstrates symmetric prompt contrast excretion within the proximal urinary collecting system. Urinary bladder is partially distended and unremarkable. Normal adrenal glands. STOMACH/BOWEL: The stomach, small and large bowel are normal in course and caliber without inflammatory changes. Status post appendectomy. VASCULAR/LYMPHATIC: Aortoiliac vessels are normal in course and caliber. No lymphadenopathy by CT size criteria. REPRODUCTIVE: Normal.  RIGHT ovarian dominant follicle. OTHER: Small volume low-density free fluid in pelvis within physiologic range. No intraperitoneal free air or focal fluid collections. MUSCULOSKELETAL: Non-acute. CT LUMBAR SPINE FINDINGS SEGMENTATION: For the purposes of this report the last  well-formed intervertebral disc space is reported as L5-S1. ALIGNMENT: Maintained lumbar lordosis. Minimal grade 1 L5-S1 anterolisthesis on the basis of chronic bilateral L5 pars interarticularis defects. Mild broad levoscoliosis. VERTEBRAE: Vertebral bodies and posterior elements are intact. Intervertebral disc heights  preserved mild annular calcification at L4-5. No destructive bony lesions. PARASPINAL AND OTHER SOFT TISSUES: Included prevertebral and paraspinal soft tissues are unremarkable. DISC LEVELS: No disc bulge, canal stenosis nor neural foraminal narrowing. IMPRESSION: CT CHEST: 1. Small volume pneumomediastinum. No lung contusion or rib fractures. CT ABDOMEN AND PELVIS: 1. No CT findings of acute trauma. No acute intra-abdominopelvic process. CT lumbar spine: 1. No acute fracture. 2. Grade 1 L5-S1 anterolisthesis on the basis of chronic bilateral L5 pars interarticularis defects. Acute findings discussed with and reconfirmed by PA Brandon on 03/17/2019 at 9:30 pm. Electronically Signed   By: Elon Alas M.D.   On: 03/17/2019 21:31   Ct L-spine No Charge  Result Date: 03/17/2019 CLINICAL DATA:  Back pain and RIGHT hip pain after fall from horse today. Shortness of breath and rib pain. EXAM: CT CHEST, ABDOMEN, AND PELVIS WITH CONTRAST CT LUMBAR SPINE WITHOUT CONTRAST TECHNIQUE: Multidetector CT imaging of the chest, abdomen and pelvis was performed following the standard protocol during bolus administration of intravenous contrast. Multiplanar reconstructed images of the lumbar spine. CONTRAST:  115mL OMNIPAQUE IOHEXOL 300 MG/ML  SOLN COMPARISON:  Chest radiograph March 17, 2019 FINDINGS: CT CHEST FINDINGS CARDIOVASCULAR: Heart size is normal. No pericardial effusion. Thoracic aorta is normal course and caliber, unremarkable. MEDIASTINUM/NODES: Small volume pneumomediastinum slightly tracking to RIGHT posterior pleural space. No lymphadenopathy by CT size criteria. Normal appearance of thoracic  esophagus though not tailored for evaluation. RIGHT chest Port-A-Cath. Minimal residual thymus. LUNGS/PLEURA: Tracheobronchial tree is patent, no pneumothorax. No pleural effusions, focal consolidations, pulmonary nodules or masses. MUSCULOSKELETAL: Non-acute. CT ABDOMEN AND PELVIS FINDINGS HEPATOBILIARY: Status post cholecystectomy.  Normal liver. PANCREAS: Normal. SPLEEN: Normal. ADRENALS/URINARY TRACT: Kidneys are orthotopic, demonstrating symmetric enhancement. No nephrolithiasis, hydronephrosis or solid renal masses. The unopacified ureters are normal in course and caliber. Delayed imaging through the kidneys demonstrates symmetric prompt contrast excretion within the proximal urinary collecting system. Urinary bladder is partially distended and unremarkable. Normal adrenal glands. STOMACH/BOWEL: The stomach, small and large bowel are normal in course and caliber without inflammatory changes. Status post appendectomy. VASCULAR/LYMPHATIC: Aortoiliac vessels are normal in course and caliber. No lymphadenopathy by CT size criteria. REPRODUCTIVE: Normal.  RIGHT ovarian dominant follicle. OTHER: Small volume low-density free fluid in pelvis within physiologic range. No intraperitoneal free air or focal fluid collections. MUSCULOSKELETAL: Non-acute. CT LUMBAR SPINE FINDINGS SEGMENTATION: For the purposes of this report the last well-formed intervertebral disc space is reported as L5-S1. ALIGNMENT: Maintained lumbar lordosis. Minimal grade 1 L5-S1 anterolisthesis on the basis of chronic bilateral L5 pars interarticularis defects. Mild broad levoscoliosis. VERTEBRAE: Vertebral bodies and posterior elements are intact. Intervertebral disc heights preserved mild annular calcification at L4-5. No destructive bony lesions. PARASPINAL AND OTHER SOFT TISSUES: Included prevertebral and paraspinal soft tissues are unremarkable. DISC LEVELS: No disc bulge, canal stenosis nor neural foraminal narrowing. IMPRESSION: CT CHEST: 1.  Small volume pneumomediastinum. No lung contusion or rib fractures. CT ABDOMEN AND PELVIS: 1. No CT findings of acute trauma. No acute intra-abdominopelvic process. CT lumbar spine: 1. No acute fracture. 2. Grade 1 L5-S1 anterolisthesis on the basis of chronic bilateral L5 pars interarticularis defects. Acute findings discussed with and reconfirmed by PA Brandon on 03/17/2019 at 9:30 pm. Electronically Signed   By: Elon Alas M.D.   On: 03/17/2019 21:31   Dg Chest Portable 1 View  Result Date: 03/17/2019 CLINICAL DATA:  28 year old female with pneumomediastinum. Follow-up. EXAM: PORTABLE CHEST 1 VIEW COMPARISON:  Earlier chest CT and chest radiograph dated  03/17/2019 FINDINGS: Right-sided Port-A-Cath with tip at the cavoatrial junction. The lungs are clear. There is no pleural effusion or pneumothorax. The cardiac silhouette is within normal limits. The pneumomediastinum seen on the prior CT is not conspicuous on radiograph. No acute osseous pathology. Right upper quadrant cholecystectomy clips. IMPRESSION: No acute cardiopulmonary process. Nonvisualization of the pneumomediastinum on radiograph. Electronically Signed   By: Anner Crete M.D.   On: 03/17/2019 23:01   Dg Chest Portable 1 View  Result Date: 03/17/2019 CLINICAL DATA:  28 year old female bucked off horse. EXAM: PORTABLE CHEST 1 VIEW COMPARISON:  Chest radiograph dated 12/04/2017 FINDINGS: Right pectoral Port-A-Cath with tip at the cavoatrial junction. The lungs are clear. There is no pleural effusion pneumothorax. The cardiac silhouette is within normal limits. No acute osseous pathology. IMPRESSION: No active disease. Electronically Signed   By: Anner Crete M.D.   On: 03/17/2019 19:47   Dg Hip Unilat W Or Wo Pelvis 2-3 Views Right  Result Date: 03/17/2019 CLINICAL DATA:  Initial evaluation for acute right hip pain status post injury. EXAM: DG HIP (WITH OR WITHOUT PELVIS) 2-3V RIGHT COMPARISON:  None. FINDINGS: There is no evidence  of hip fracture or dislocation. There is no evidence of arthropathy or other focal bone abnormality. IMPRESSION: Negative. Electronically Signed   By: Jeannine Boga M.D.   On: 03/17/2019 20:51    Anti-infectives: Anti-infectives (From admission, onward)   None       Assessment/Plan Fall from Horse Pneumomediastinum - resolved on repeat CXR.  Transient hypoxemia -  Breathing well with good sats.  Will have her walk and chest sats. Paresthesias of right thigh - doesn't complain of today Seizure disorder - resume all home meds FEN - regular diet, SLIV VTE - Lovenox ID - none Dispo - hopefully home after eating and mobilizing   LOS: 0 days    Henreitta Cea , [facility] 03/18/2019, 8:09 AM Pager: [phone]

## 2019-03-18 NOTE — Progress Notes (Signed)
Pt arrived to floor from [place] ED. Pt alert and oriented x4. Oriented to room and call bell. Admitting doctor notified pt is here.

## 2019-03-18 NOTE — Progress Notes (Signed)
I'm about to give the schedule med like Lacosamide and Keppra she said she already took it from her bag, I explained it to her that we need to bring it to the pharmacy , I spoke to Santiago Glad, Software engineer and she agreed that needs to bring to the pharmacy, but I'm still waiting for MD order if she will be discharge or not, charge nurse aware.

## 2019-03-19 DIAGNOSIS — R569 Unspecified convulsions: Secondary | ICD-10-CM | POA: Diagnosis not present

## 2019-03-19 DIAGNOSIS — R0902 Hypoxemia: Secondary | ICD-10-CM | POA: Diagnosis not present

## 2019-03-19 MED ORDER — HEPARIN SOD (PORK) LOCK FLUSH 100 UNIT/ML IV SOLN
500.0000 [IU] | INTRAVENOUS | Status: AC | PRN
  Administered 2019-03-19: 500 [IU]

## 2019-03-19 NOTE — Progress Notes (Signed)
[name] to be D/C'd  per MD order. Discussed with the patient and all questions fully answered.  VSS, Skin clean, dry and intact without evidence of skin break down, no evidence of skin tears noted.  IV catheter discontinued intact. Site without signs and symptoms of complications. Dressing and pressure applied.  An After Visit Summary was printed and given to the patient.  D/c education completed with patient/family including follow up instructions, medication list, d/c activities limitations if indicated, with other d/c instructions as indicated by MD - patient able to verbalize understanding, all questions fully answered.   Patient instructed to return to ED, call 911, or call MD for any changes in condition.   Patient D/C home via private auto.

## 2019-03-19 NOTE — Evaluation (Signed)
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 She was wearing a helmet.  + LOC.  She presented with headache, dizziness, back pain, shortness of breath, and right hip pain. CT revealed Pneumomediastinum, head CT negative. Repeat CXR was unremarkable   Clinical Impression   PTA, pt was independent and is a PhD student with GA position. Recently moved back to live with her mother during university shut down for COVID-19. Pt currently performing near baseline function demonstrating good balance, strength, and activity tolerance. Pt presenting near baseline for cognition and demonstrating [facility] memory, problem solving, and awareness. Pt stating "I feel five times better than yesterday." Provided education on concussion symptoms and ways to decrease sensory input (letting her brain rest); especially with her increased computer use for work. Recommend dc home once medically stable and all acute OT needs met. Will sign off. Thank you.  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 Notified RN.     Follow Up Recommendations  No OT follow up    Equipment Recommendations  None recommended by OT    Recommendations for Other Services       Precautions / Restrictions Precautions Precautions: None Restrictions Weight Bearing Restrictions: No      Mobility Bed Mobility Overal bed mobility: Independent                Transfers Overall transfer level: Independent                    Balance Overall balance assessment: Needs assistance   Sitting  balance-Leahy Scale: Normal       Standing balance-Leahy Scale: Good                             ADL either performed or assessed with clinical judgement   ADL Overall ADL's : Independent                                       General ADL Comments: Pt performing ADLs and functional mobility at baseline function     Vision Baseline Vision/History: Wears glasses Wears Glasses: Reading only Patient Visual Report: No change from baseline Additional Comments: Reports blurry vision initially but states it has resolved. Pt able to read and navigate around hallways     Perception     Praxis      Pertinent Vitals/Pain Pain Assessment: No/denies pain     Hand Dominance     Extremity/Trunk Assessment Upper Extremity Assessment Upper Extremity Assessment: Overall WFL for tasks assessed   Lower Extremity Assessment Lower Extremity Assessment: Overall WFL for tasks assessed   Cervical / Trunk Assessment Cervical / Trunk Assessment: Normal   Communication Communication Communication: No difficulties   Cognition Arousal/Alertness: Awake/alert   Overall Cognitive Status: Within Functional Limits for tasks assessed  General Comments: Pt stating "I feel five times better than yesterday." Pt presenting near baseline for memory, awareness, and problem solving. 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 Denies dizziness and blurry vision.    General Comments  Educating pt on concussion symptoms. Discussed need for decreased sensory input and to take breaks when using computer.     Exercises     Shoulder Instructions      Home Living Family/patient expects to be discharged to:: Private residence Living Arrangements: Parent Available Help at Discharge: Family;Available 24 hours/day Type of Home: House                               Lives With: Other (Comment);Family(mom)    Prior Functioning/Environment Level of Independence: Independent        Comments: PhD student in [employer]        OT Problem List: Decreased activity tolerance;Impaired balance (sitting and/or standing);Decreased knowledge of precautions;Decreased cognition;Decreased safety awareness;Pain      OT Treatment/Interventions:      OT Goals(Current goals can be found in the care plan section) Acute Rehab OT Goals Patient Stated Goal: go home OT Goal Formulation: All assessment and education complete, DC therapy  OT Frequency:     Barriers to D/C:            Co-evaluation              AM-PAC OT "6 Clicks" Daily Activity     Outcome Measure Help from another person eating meals?: None Help from another person taking care of personal grooming?: None Help from another person toileting, which includes using toliet, bedpan, or urinal?: None Help from another person bathing (including washing, rinsing, drying)?: None Help from another person to put on and taking off regular upper body clothing?: None Help from another person to put on and taking off regular lower body clothing?: None 6 Click Score: 24   End of Session Nurse Communication: Mobility status  Activity Tolerance: Patient tolerated treatment well Patient left: in bed;with call bell/phone within reach  OT Visit Diagnosis: Other abnormalities of gait and mobility (R26.89);Other symptoms and signs involving cognitive function;Pain Pain - part of body: (HA)                Time: 0041-5930 OT Time Calculation (min): 15 min Charges:  OT General Charges $OT Visit: 1 Visit OT Evaluation $OT Eval Low Complexity: [place], OTR/L Acute Rehab Pager: [phone] Office: [place] 03/19/2019, 8:50 AM

## 2019-03-19 NOTE — Plan of Care (Signed)

## 2019-03-19 NOTE — Discharge Summary (Signed)
Patient ID: [name] [id number] 03/05/1991 28 y.o.  Admit date: 03/17/2019 Discharge date: 03/19/2019  Admitting Diagnosis: Pneumomediastinum  Discharge Diagnosis Patient Active Problem List   Diagnosis Date Noted  . Pneumomediastinum ([place]) 03/17/2019  . Seizure ([place]) 10/05/2018  . 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 Syncope 05/21/2018  . Seizures ([place]) 12/04/2017  . Cellulitis of chest wall 10/07/2017  . Infected venous access port, initial encounter 10/07/2017  . 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 Partial idiopathic epilepsy with seizures of localized onset, intractable, without status epilepticus ([place]) 07/12/2015  . Hypoglycemia 06/12/2014  . Migraine, unspecified, without mention of intractable migraine without mention of status migrainosus 06/29/2013  . Asthma 11/27/2011  . Muscle weakness of lower extremity 11/26/2011  . Acute appendicitis with localized peritonitis 11/26/2011  . 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 Seizure disorder ([place]) 08/16/2009  . HEARING LOSS, BILATERAL 05/25/2008  concussion  Consultants none  Reason for Admission: This is a 28 year old female with a complex neurologic history who presented to [facility] after being thrown off a horse today around 11:30 AM.  She was wearing a helmet.  + LOC.  She presented with headache, dizziness, back pain, shortness of breath, and right hip pain.  She has frequent seizures.  She was evaluated at [facility] and the CT chest showed only a small amount of pneumomediastinum.  No other injuries noted.  She was going to be discharged home when she developed some low oxygen saturations temporarily.  Repeat CXR was unremarkable.  She is being transferred to [facility] for observation.  Procedures none  Hospital Course:  The patient was admitted for observation after she was noted to have transient hypoxemia at [facility].  Her follow up CXR the  following day showed no residual pneumomediastinum. Her O2 sats remained normal.  She was mobilized and developed dizziness and nausea/vomiting.  She was found to have a concussion secondary to hitting her head when she fell.  PT and speech evaluated her and felt she had findings of a concussion but given her support at home she likely did not need any further follow up.  She was mobilized on HD 2 and was significantly better with only a slight HA or nausea when looking at her computer.  She was felt stable for Dc home at this time.    Physical Exam: Gen: NAD, looks significantly better than yesterday. HEENT: PERRL Heart: Regular Lungs: CTAB Abd: soft, NT, ND Psych: mentation and interactions are much more age appropriate and normal today  Allergies as of 03/19/2019      Reactions   Cefzil [cefprozil] Anaphylaxis, Shortness Of Breath, Rash   Cephalosporins Anaphylaxis, Shortness Of Breath, Rash   Midazolam Nausea And Vomiting   Shellfish Allergy Hives, Rash   Shellfish-derived Products Hives, Rash   Latex Rash   Penicillins Hives, Rash, Other (See Comments)   Has patient had a PCN reaction causing immediate rash, facial/tongue/throat swelling, SOB or lightheadedness with hypotension: Yes Has patient had a PCN reaction causing severe rash involving mucus membranes or skin necrosis: No Has patient had a PCN reaction that required hospitalization: No Has patient had a PCN reaction occurring within the last 10 years: No If all of the above answers are "NO", then may proceed with Cephalosporin use.   Tegaderm Ag Mesh [silver] Rash      Medication List    STOP taking these medications  HYDROcodone-acetaminophen 5-325 MG tablet Commonly known as:  NORCO/VICODIN   nortriptyline 10 MG capsule Commonly known as:  PAMELOR     TAKE these medications   albuterol 108 (90 Base) MCG/ACT inhaler Commonly known as:  PROVENTIL HFA;VENTOLIN HFA Inhale 1-2 puffs into the lungs every 6 (six) hours  as needed for wheezing or shortness of breath.   clindamycin 1 % lotion Commonly known as:  CLEOCIN T Apply 1 application topically See admin instructions. Apply to affected areas of the face 2 times a day   clindamycin-benzoyl peroxide gel Commonly known as:  BENZACLIN Apply 1 application topically See admin instructions. Apply to face 2 times a day as directed   ibuprofen 200 MG tablet Commonly known as:  ADVIL,MOTRIN Take 800 mg by mouth every 8 (eight) hours as needed (for cramping or pain).   Lacosamide 100 MG Tabs Commonly known as:  Vimpat Take 1 tablet (100 mg total) by mouth 2 (two) times daily.   lamoTRIgine 100 MG tablet Commonly known as:  LaMICtal Take 1 tablet every night What changed:    how much to take  how to take this  when to take this  additional instructions   levETIRAcetam 500 MG tablet Commonly known as:  KEPPRA Take 1 tab in AM, 1 tab at noon, 2 tabs at night What changed:    how much to take  how to take this  when to take this   LORazepam 0.5 MG tablet Commonly known as:  ATIVAN Take 1 tablet (0.5 mg total) by mouth as needed for anxiety. What changed:    when to take this  reasons to take this   Perampanel 4 MG Tabs Commonly known as:  Fycompa Take 2 tablets (8 mg total) by mouth every evening.   tretinoin 0.025 % cream Commonly known as:  RETIN-A Apply 1 application topically See admin instructions. Apply to face daily as directed   triamcinolone lotion 0.1 % Commonly known as:  KENALOG Apply 1 application topically See admin instructions. Apply to scalp as directed 2 times a day        Follow-up Information    Nafziger, [name], NP Follow up.   Specialty:  Family Medicine Why:  as needed Contact information: [place] [address] [place] Follow up.   Why:  no follow up needed with trauma service.  Call if you have questions Contact information: [place] [id number] [phone]       [facility] Follow up.   Why:  follow up as needed for concussion and previous seizure disorder Contact information: [place], [place] [phone]          Signed: Saverio Danker, [facility] 03/19/2019, 9:07 AM Pager: [phone]

## 2019-03-19 NOTE — Discharge Instructions (Signed)
Concussion, Adult  A concussion is a brain injury from a direct hit (blow) to the head or body. This injury causes the brain to shake quickly back and forth inside the skull. It is caused by:  · A hit to the head.  · A quick and sudden movement (jolt) of the head or neck.  How fast you will get better from a concussion depends on many things. Recovery can take time. It is important to wait to return to activity until a doctor says it is safe and your symptoms are all gone.  Follow these instructions at home:  Activity  · Limit activities that need a lot of thought or concentration. You may need to talk with your work manager or teachers about this. Limit activities such as:  ? Homework or work for your job.  ? Watching TV.  ? Computer work.  ? Playing memory games and puzzles.  · Rest. Rest helps the brain to heal. Make sure you:  ? Get plenty of sleep at night. Do not stay up late.  ? Rest during the day. Take naps or rest breaks when you feel tired.  · Do not do activities that could cause a second concussion, such as riding a bike or playing sports. It can be dangerous if you get another concussion before the first one has healed.  · Ask your doctor when you can return to your normal activities, like driving, riding a bike, or using machinery. Your ability to react may be slower. Do not do these activities if you are dizzy. Your doctor will likely give you a plan for slowly going back to activities.  General instructions  · Take over-the-counter and prescription medicines only as told by your doctor.  · Do not drink alcohol until your doctor says you can.  · Watch your symptoms and tell other people to do the same. Other problems (complications) can happen after a concussion. Older adults with a brain injury may have a higher risk of serious problems, such as a blood clot in the brain.  · Tell your work manager, teachers, school nurse, school counselor, coach, or athletic trainer about your injury and symptoms.  Tell them about what you can or cannot do. They should watch you for:  ? More problems with attention or concentration.  ? More trouble remembering or learning new information.  ? More time needed to do tasks or assignments.  ? Being more annoyed (irritable) or having a harder time dealing with stress.  ? Any other symptoms that get worse.  · Keep all follow-up visits as told by your doctor. This is important.  Prevention  · It is very important that you donot get another brain injury, especially before you have healed. In rare cases, another injury can cause permanent brain damage, brain swelling, or death. You have the most risk if you get another head injury in the first 7-10 days after you were hurt before. To avoid injuries:  ? Avoid activities that could make you get a second concussion, like contact sports.  ? When you have returned to sports or activities:  § Avoid plays or moves that can cause you to crash into another person. This is how most concussions happen.  § Follow the rules and be respectful of other players.  ? Get regular exercise that includes strength and balance training.  ? Wear a helmet when you do activities like:  § Biking.  § Skiing.  § Skateboarding.  § Skating.  ?   Helmets can help protect you from serious skull and brain injuries, but they do not protect your from a concussion. Even when wearing a helmet, you should avoid being hit in the head.  Contact a doctor if:  · Your symptoms get worse or they do not get better.  · You have new symptoms.  · You have another injury.  Get help right away if:  · You have bad headaches or your headaches get worse.  · You have weakness in any part of your body.  · You are confused.  · Your coordination gets worse.  · You keep throwing up (vomiting).  · You feel more sleepy than normal.  · You twitch or shake violently (convulse) or have a seizure.  · Your speech is not clear (is slurred).  · You have strange behavior changes.  · You have changes in  how you see (vision).  · You pass out (lose consciousness).  Summary  · A concussion is a brain injury from a direct hit (blow) to the head or body.  · This condition is treated with rest and careful watching of symptoms.  · If you keep having symptoms, call your doctor.  This information is not intended to replace advice given to you by your health care provider. Make sure you discuss any questions you have with your health care provider.  Document Released: 11/19/2009 Document Revised: 01/12/2018 Document Reviewed: 01/12/2018  Elsevier Interactive Patient Education © 2019 [employer]

## 2019-04-06 ENCOUNTER — Ambulatory Visit: Payer: BLUE CROSS/BLUE SHIELD | Admitting: Clinical

## 2019-04-10 ENCOUNTER — Ambulatory Visit (INDEPENDENT_AMBULATORY_CARE_PROVIDER_SITE_OTHER): Payer: BLUE CROSS/BLUE SHIELD | Admitting: Clinical

## 2019-04-10 ENCOUNTER — Encounter (HOSPITAL_BASED_OUTPATIENT_CLINIC_OR_DEPARTMENT_OTHER): Payer: Self-pay | Admitting: *Deleted

## 2019-04-10 ENCOUNTER — Emergency Department (HOSPITAL_BASED_OUTPATIENT_CLINIC_OR_DEPARTMENT_OTHER)
Admission: EM | Admit: 2019-04-10 | Discharge: 2019-04-10 | Disposition: A | Discharge status: Home / Self Care | Payer: BLUE CROSS/BLUE SHIELD | Attending: Emergency Medicine | Admitting: Emergency Medicine

## 2019-04-10 ENCOUNTER — Other Ambulatory Visit: Payer: Self-pay

## 2019-04-10 ENCOUNTER — Emergency Department (HOSPITAL_BASED_OUTPATIENT_CLINIC_OR_DEPARTMENT_OTHER): Payer: BLUE CROSS/BLUE SHIELD

## 2019-04-10 DIAGNOSIS — Y939 Activity, unspecified: Secondary | ICD-10-CM | POA: Diagnosis not present

## 2019-04-10 DIAGNOSIS — W19XXXA Unspecified fall, initial encounter: Secondary | ICD-10-CM

## 2019-04-10 DIAGNOSIS — F89 Unspecified disorder of psychological development: Secondary | ICD-10-CM

## 2019-04-10 DIAGNOSIS — Y999 Unspecified external cause status: Secondary | ICD-10-CM | POA: Diagnosis not present

## 2019-04-10 DIAGNOSIS — B349 Viral infection, unspecified: Secondary | ICD-10-CM | POA: Insufficient documentation

## 2019-04-10 DIAGNOSIS — Y929 Unspecified place or not applicable: Secondary | ICD-10-CM | POA: Diagnosis not present

## 2019-04-10 DIAGNOSIS — R079 Chest pain, unspecified: Secondary | ICD-10-CM

## 2019-04-10 DIAGNOSIS — R0789 Other chest pain: Secondary | ICD-10-CM | POA: Diagnosis not present

## 2019-04-10 DIAGNOSIS — R0602 Shortness of breath: Secondary | ICD-10-CM | POA: Diagnosis not present

## 2019-04-10 NOTE — ED Notes (Addendum)
Pt states she is hearing impaired, wears hearing aids. Unable to communicate via phone.

## 2019-04-10 NOTE — ED Triage Notes (Addendum)
Pt reports SOB x 3 days with bodyaches. Temp 99.8, states she took advil 1 hour pta. Pt also c/o pain in left chest. States she injured her chest 3 weeks ago in a horseback riding accident and pain is in the same place

## 2019-04-10 NOTE — ED Notes (Signed)
ED Provider at bedside. 
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
## 2019-04-10 NOTE — ED Notes (Addendum)
Pt verbalized understandingof discharge instructions, and to self quarantine until afebrile x 3 days, oar if symptoms worsen, for no less than 14 days and until afebrile for 3 days following.

## 2019-04-10 NOTE — ED Notes (Signed)
Signature pad unresponsive

## 2019-04-10 NOTE — Discharge Instructions (Signed)
Your history and exam today are consistent with a viral illness causing your chills, fatigue, myalgias, and malaise.  I suspect her headache is from both the viral infection as well as your recent concussion.  After your report of the left chest wall pain after the recent fall, the x-ray was performed showing no fracture, pneumothorax, pneumonia, pulmonary contusion, rib fracture, or other significant abnormality.  We did not see evidence of pneumomediastinum which she had last time with hypoxia.  Her oxygen saturations have been normal here.  After our shared decision-making conversation, we agreed to hold on further CT imaging or labs.  We feel you are safe for discharge home for outpatient PCP follow-up.  If any symptoms change or worsen, please return to nearest emergency department.  Given the current pandemic, given your viral syndrome, we are going to recommend self quarantine.  Please see the instructions below for how to do this.     Person Under Monitoring Name: [name]  Location: [address] [place] [address]   Infection Prevention Recommendations for Individuals Confirmed to have, or Being Evaluated for, 2019 Novel Coronavirus (COVID-19) Infection Who Receive Care at Home  Individuals who are confirmed to have, or are being evaluated for, COVID-19 should follow the prevention steps below until a healthcare provider or local or state health department says they can return to normal activities.  Stay home except to get medical care You should restrict activities outside your home, except for getting medical care. Do not go to work, school, or public areas, and do not use public transportation or taxis.  Call ahead before visiting your doctor Before your medical appointment, call the healthcare provider and tell them that you have, or are being evaluated for, COVID-19 infection. This will help the healthcare providers office take steps to keep other people from  getting infected. Ask your healthcare provider to call the local or state health department.  Monitor your symptoms Seek prompt medical attention if your illness is worsening (e.g., difficulty breathing). Before going to your medical appointment, call the healthcare provider and tell them that you have, or are being evaluated for, COVID-19 infection. Ask your healthcare provider to call the local or state health department.  Wear a facemask You should wear a facemask that covers your nose and mouth when you are in the same room with other people and when you visit a healthcare provider. People who live with or visit you should also wear a facemask while they are in the same room with you.  Separate yourself from other people in your home As much as possible, you should stay in a different room from other people in your home. Also, you should use a separate bathroom, if available.  Avoid sharing household items You should not share dishes, drinking glasses, cups, eating utensils, towels, bedding, or other items with other people in your home. After using these items, you should wash them thoroughly with soap and water.  Cover your coughs and sneezes Cover your mouth and nose with a tissue when you cough or sneeze, or you can cough or sneeze into your sleeve. Throw used tissues in a lined trash can, and immediately wash your hands with soap and water for at least 20 seconds or use an alcohol-based hand rub.  Wash your [facility] your hands often and thoroughly with soap and water for at least 20 seconds. You can use an alcohol-based hand sanitizer if soap and water are not available and if your  hands are not visibly dirty. Avoid touching your eyes, nose, and mouth with unwashed hands.   Prevention Steps for Caregivers and Household Members of Individuals Confirmed to have, or Being Evaluated for, COVID-19 Infection Being Cared for in the Home  If you live with, or provide care at  home for, a person confirmed to have, or being evaluated for, COVID-19 infection please follow these guidelines to prevent infection:  Follow healthcare providers instructions Make sure that you understand and can help the patient follow any healthcare provider instructions for all care.  Provide for the patients basic needs You should help the patient with basic needs in the home and provide support for getting groceries, prescriptions, and other personal needs.  Monitor the patients symptoms If they are getting sicker, call his or her medical provider and tell them that the patient has, or is being evaluated for, COVID-19 infection. This will help the healthcare providers office take steps to keep other people from getting infected. Ask the healthcare provider to call the local or state health department.  Limit the number of people who have contact with the patient If possible, have only one caregiver for the patient. Other household members should stay in another home or place of residence. If this is not possible, they should stay in another room, or be separated from the patient as much as possible. Use a separate bathroom, if available. Restrict visitors who do not have an essential need to be in the home.  Keep older adults, very young children, and other sick people away from the patient Keep older adults, very young children, and those who have compromised immune systems or chronic health conditions away from the patient. This includes people with chronic heart, lung, or kidney conditions, diabetes, and cancer.  Ensure good ventilation Make sure that shared spaces in the home have good air flow, such as from an air conditioner or an opened window, weather permitting.  Wash your hands often Wash your hands often and thoroughly with soap and water for at least 20 seconds. You can use an alcohol based hand sanitizer if soap and water are not available and if your hands are not  visibly dirty. Avoid touching your eyes, nose, and mouth with unwashed hands. Use disposable paper towels to dry your hands. If not available, use dedicated cloth towels and replace them when they become wet.  Wear a facemask and gloves Wear a disposable facemask at all times in the room and gloves when you touch or have contact with the patients blood, body fluids, and/or secretions or excretions, such as sweat, saliva, sputum, nasal mucus, vomit, urine, or feces.  Ensure the mask fits over your nose and mouth tightly, and do not touch it during use. Throw out disposable facemasks and gloves after using them. Do not reuse. Wash your hands immediately after removing your facemask and gloves. If your personal clothing becomes contaminated, carefully remove clothing and launder. Wash your hands after handling contaminated clothing. Place all used disposable facemasks, gloves, and other waste in a lined container before disposing them with other household waste. Remove gloves and wash your hands immediately after handling these items.  Do not share dishes, glasses, or other household items with the patient Avoid sharing household items. You should not share dishes, drinking glasses, cups, eating utensils, towels, bedding, or other items with a patient who is confirmed to have, or being evaluated for, COVID-19 infection. After the person uses these items, you should wash them thoroughly with  soap and water.  Wash laundry thoroughly Immediately remove and wash clothes or bedding that have blood, body fluids, and/or secretions or excretions, such as sweat, saliva, sputum, nasal mucus, vomit, urine, or feces, on them. Wear gloves when handling laundry from the patient. Read and follow directions on labels of laundry or clothing items and detergent. In general, wash and dry with the warmest temperatures recommended on the label.  Clean all areas the individual has used often Clean all touchable  surfaces, such as counters, tabletops, doorknobs, bathroom fixtures, toilets, phones, keyboards, tablets, and bedside tables, every day. Also, clean any surfaces that may have blood, body fluids, and/or secretions or excretions on them. Wear gloves when cleaning surfaces the patient has come in contact with. Use a diluted bleach solution (e.g., dilute bleach with 1 part bleach and 10 parts water) or a household disinfectant with a label that says EPA-registered for coronaviruses. To make a bleach solution at home, add 1 tablespoon of bleach to 1 quart (4 cups) of water. For a larger supply, add  cup of bleach to 1 gallon (16 cups) of water. Read labels of cleaning products and follow recommendations provided on product labels. Labels contain instructions for safe and effective use of the cleaning product including precautions you should take when applying the product, such as wearing gloves or eye protection and making sure you have good ventilation during use of the product. Remove gloves and wash hands immediately after cleaning.  Monitor yourself for signs and symptoms of illness Caregivers and household members are considered close contacts, should monitor their health, and will be asked to limit movement outside of the home to the extent possible. Follow the monitoring steps for close contacts listed on the symptom monitoring form.   ? If you have additional questions, contact your local health department or call the epidemiologist on call at [phone] (available 24/7). ? This guidance is subject to change. For the most up-to-date guidance from [facility], please refer to their website: [email or web address]

## 2019-04-15 ENCOUNTER — Ambulatory Visit (INDEPENDENT_AMBULATORY_CARE_PROVIDER_SITE_OTHER): Payer: BLUE CROSS/BLUE SHIELD | Admitting: Adult Health

## 2019-04-15 ENCOUNTER — Other Ambulatory Visit: Payer: Self-pay

## 2019-04-15 DIAGNOSIS — B882 Other arthropod infestations: Secondary | ICD-10-CM | POA: Diagnosis not present

## 2019-04-15 MED ORDER — DOXYCYCLINE HYCLATE 100 MG PO CAPS: 100.0000 mg | ORAL_CAPSULE | Freq: Two times a day (BID) | ORAL | 0 refills | Status: DC
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
## 2019-04-16 ENCOUNTER — Ambulatory Visit: Payer: BLUE CROSS/BLUE SHIELD | Admitting: Clinical

## 2019-04-30 ENCOUNTER — Ambulatory Visit: Payer: BLUE CROSS/BLUE SHIELD | Admitting: Clinical

## 2019-04-30 ENCOUNTER — Ambulatory Visit (INDEPENDENT_AMBULATORY_CARE_PROVIDER_SITE_OTHER): Payer: BLUE CROSS/BLUE SHIELD | Admitting: Clinical

## 2019-04-30 DIAGNOSIS — F89 Unspecified disorder of psychological development: Secondary | ICD-10-CM

## 2019-05-01 ENCOUNTER — Encounter (HOSPITAL_COMMUNITY): Payer: Self-pay | Admitting: Emergency Medicine

## 2019-05-01 ENCOUNTER — Other Ambulatory Visit: Payer: Self-pay

## 2019-05-01 ENCOUNTER — Emergency Department (HOSPITAL_COMMUNITY): Payer: BLUE CROSS/BLUE SHIELD

## 2019-05-01 ENCOUNTER — Emergency Department (HOSPITAL_COMMUNITY)
Admission: EM | Admit: 2019-05-01 | Discharge: 2019-05-01 | Disposition: A | Discharge status: Home / Self Care | Payer: BLUE CROSS/BLUE SHIELD | Attending: Emergency Medicine | Admitting: Emergency Medicine

## 2019-05-01 DIAGNOSIS — Z91040 Latex allergy status: Secondary | ICD-10-CM | POA: Insufficient documentation

## 2019-05-01 DIAGNOSIS — Z79899 Other long term (current) drug therapy: Secondary | ICD-10-CM | POA: Diagnosis not present

## 2019-05-01 DIAGNOSIS — R112 Nausea with vomiting, unspecified: Secondary | ICD-10-CM | POA: Insufficient documentation

## 2019-05-01 DIAGNOSIS — R509 Fever, unspecified: Secondary | ICD-10-CM | POA: Diagnosis not present

## 2019-05-01 DIAGNOSIS — R197 Diarrhea, unspecified: Secondary | ICD-10-CM | POA: Insufficient documentation

## 2019-05-01 DIAGNOSIS — R1084 Generalized abdominal pain: Secondary | ICD-10-CM | POA: Diagnosis not present

## 2019-05-01 DIAGNOSIS — Z20828 Contact with and (suspected) exposure to other viral communicable diseases: Secondary | ICD-10-CM | POA: Diagnosis not present

## 2019-05-01 DIAGNOSIS — R1110 Vomiting, unspecified: Secondary | ICD-10-CM | POA: Diagnosis not present

## 2019-05-01 DIAGNOSIS — R102 Pelvic and perineal pain: Secondary | ICD-10-CM | POA: Diagnosis not present

## 2019-05-01 LAB — C DIFFICILE QUICK SCREEN W PCR REFLEX
C Diff antigen: NEGATIVE
C Diff interpretation: NOT DETECTED
C Diff toxin: NEGATIVE

## 2019-05-01 LAB — CBC
HCT: 41.4 % (ref 36.0–46.0)
Hemoglobin: 13.5 g/dL (ref 12.0–15.0)
MCH: 28.9 pg (ref 26.0–34.0)
MCHC: 32.6 g/dL (ref 30.0–36.0)
MCV: 88.7 fL (ref 80.0–100.0)
Platelets: 381 10*3/uL (ref 150–400)
RBC: 4.67 MIL/uL (ref 3.87–5.11)
RDW: 12.2 % (ref 11.5–15.5)
WBC: 6 10*3/uL (ref 4.0–10.5)
nRBC: 0 % (ref 0.0–0.2)

## 2019-05-01 LAB — COMPREHENSIVE METABOLIC PANEL
ALT: 10 U/L (ref 0–44)
AST: 17 U/L (ref 15–41)
Albumin: 4.2 g/dL (ref 3.5–5.0)
Alkaline Phosphatase: 51 U/L (ref 38–126)
Anion gap: 10 (ref 5–15)
BUN: 7 mg/dL (ref 6–20)
CO2: 21 mmol/L — ABNORMAL LOW (ref 22–32)
Calcium: 9.2 mg/dL (ref 8.9–10.3)
Chloride: 106 mmol/L (ref 98–111)
Creatinine, Ser: 0.6 mg/dL (ref 0.44–1.00)
GFR calc Af Amer: >60 mL/min (ref >60)
GFR calc non Af Amer: >60 mL/min (ref >60)
Glucose, Bld: 97 mg/dL (ref 70–99)
Potassium: 4.2 mmol/L (ref 3.5–5.1)
Sodium: 137 mmol/L (ref 135–145)
Total Bilirubin: 0.4 mg/dL (ref 0.3–1.2)
Total Protein: 6.8 g/dL (ref 6.5–8.1)

## 2019-05-01 LAB — URINALYSIS, ROUTINE W REFLEX MICROSCOPIC
Bacteria, UA: NONE SEEN
Bilirubin Urine: NEGATIVE
Glucose, UA: NEGATIVE mg/dL
Ketones, ur: NEGATIVE mg/dL
Leukocytes,Ua: NEGATIVE
Nitrite: NEGATIVE
Protein, ur: NEGATIVE mg/dL
Specific Gravity, Urine: 1.003 — ABNORMAL LOW (ref 1.005–1.030)
pH: 6 (ref 5.0–8.0)

## 2019-05-01 LAB — I-STAT BETA HCG BLOOD, ED (MC, WL, AP ONLY): I-stat hCG, quantitative: <5 m[IU]/mL (ref <5)

## 2019-05-01 LAB — SARS CORONAVIRUS 2 BY RT PCR (HOSPITAL ORDER, PERFORMED IN ~~LOC~~ HOSPITAL LAB): SARS Coronavirus 2: NEGATIVE

## 2019-05-01 LAB — LIPASE, BLOOD: Lipase: 25 U/L (ref 11–51)

## 2019-05-01 MED ORDER — SODIUM CHLORIDE 0.9% FLUSH: 10.0000 mL | INTRAVENOUS | Status: DC | PRN

## 2019-05-01 MED ORDER — MORPHINE SULFATE (PF) 4 MG/ML IV SOLN
4.0000 mg | Freq: Once | INTRAVENOUS | Status: AC
  Administered 2019-05-01: 4 mg via INTRAVENOUS
  Filled 2019-05-01: qty 1

## 2019-05-01 MED ORDER — SODIUM CHLORIDE 0.9% FLUSH
3.0000 mL | Freq: Once | INTRAVENOUS | Status: AC
  Administered 2019-05-01: 3 mL via INTRAVENOUS

## 2019-05-01 MED ORDER — HEPARIN SOD (PORK) LOCK FLUSH 100 UNIT/ML IV SOLN
500.0000 [IU] | Freq: Once | INTRAVENOUS | Status: AC
  Administered 2019-05-01: 500 [IU]
  Filled 2019-05-01: qty 5

## 2019-05-01 MED ORDER — IOHEXOL 300 MG/ML  SOLN
100.0000 mL | Freq: Once | INTRAMUSCULAR | Status: AC | PRN
  Administered 2019-05-01: 13:00:00 100 mL via INTRAVENOUS

## 2019-05-01 MED ORDER — LEVETIRACETAM IN NACL 1000 MG/100ML IV SOLN
1000.0000 mg | Freq: Once | INTRAVENOUS | Status: AC
  Administered 2019-05-01: 1000 mg via INTRAVENOUS
  Filled 2019-05-01: qty 100

## 2019-05-01 MED ORDER — SODIUM CHLORIDE 0.9 % IV BOLUS
1000.0000 mL | Freq: Once | INTRAVENOUS | Status: AC
  Administered 2019-05-01: 1000 mL via INTRAVENOUS

## 2019-05-01 MED ORDER — LORAZEPAM 2 MG/ML IJ SOLN
1.0000 mg | Freq: Once | INTRAMUSCULAR | Status: AC
  Administered 2019-05-01: 1 mg via INTRAVENOUS
  Filled 2019-05-01: qty 1

## 2019-05-01 MED ORDER — KETOROLAC TROMETHAMINE 30 MG/ML IJ SOLN
15.0000 mg | Freq: Once | INTRAMUSCULAR | Status: AC
  Administered 2019-05-01: 15 mg via INTRAVENOUS
  Filled 2019-05-01: qty 1

## 2019-05-01 MED ORDER — ONDANSETRON 4 MG PO TBDP: 4.0000 mg | ORAL_TABLET | Freq: Three times a day (TID) | ORAL | 0 refills | Status: DC | PRN

## 2019-05-01 MED ORDER — ONDANSETRON HCL 4 MG/2ML IJ SOLN
4.0000 mg | Freq: Once | INTRAMUSCULAR | Status: AC
  Administered 2019-05-01: 4 mg via INTRAVENOUS
  Filled 2019-05-01: qty 2

## 2019-05-01 MED ORDER — MORPHINE SULFATE (PF) 4 MG/ML IV SOLN
INTRAVENOUS | Status: AC
  Administered 2019-05-01: 4 mg via INTRAVENOUS
  Filled 2019-05-01: qty 1

## 2019-05-01 NOTE — ED Notes (Signed)
After administering Ativan 1 mg, pt began to jerk head and twitch, with eyes rolling to back of head; pt appeared to be having myoclonic seizure. Notified another RN to get ED provider while I stayed with pt at the bedside. Will continue to monitor

## 2019-05-01 NOTE — ED Notes (Signed)
Pt mom Cathy [phone]
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
## 2019-05-01 NOTE — ED Triage Notes (Signed)
Pt. Stated, I started having bad lower right pain, It might be an ovary. Im throwing up and its been 4 times.

## 2019-05-01 NOTE — Discharge Instructions (Addendum)
Extensive ER work up to include CT abdomen and pelvis, pelvic ultrasound, urine, complete blood count, complete chemistry, lipase, c. Diff and COVID testing without any source for symptoms today.  IV Keppra dose given in the ER due to seizure while in the ER, likely from missing medications due to vomiting.   Take Zofran as needed as prescribed for nausea and vomiting. Resume normal home medications.  Call your doctor tomorrow for follow up in 1-2 days, return to the ER for any new or worsening symptoms.

## 2019-05-01 NOTE — ED Notes (Signed)
Pt c/o continued nausea.

## 2019-05-01 NOTE — ED Notes (Signed)
Patient transported to Ultrasound 

## 2019-05-01 NOTE — ED Notes (Signed)
Patient verbalizes understanding of discharge instructions. Opportunity for questioning and answers were provided. Armband removed by staff, pt discharged from ED.  

## 2019-05-01 NOTE — ED Notes (Signed)
Patient transported to CT 

## 2019-05-01 NOTE — ED Notes (Signed)
ED Provider at bedside. 

## 2019-05-02 LAB — GC/CHLAMYDIA PROBE AMP (~~LOC~~) NOT AT ARMC
Chlamydia: NEGATIVE
Neisseria Gonorrhea: NEGATIVE

## 2019-05-11 DIAGNOSIS — L700 Acne vulgaris: Secondary | ICD-10-CM | POA: Diagnosis not present

## 2019-05-14 ENCOUNTER — Ambulatory Visit: Payer: BLUE CROSS/BLUE SHIELD | Admitting: Clinical

## 2019-05-26 ENCOUNTER — Encounter: Payer: Self-pay | Admitting: Adult Health

## 2019-05-26 ENCOUNTER — Other Ambulatory Visit: Payer: Self-pay

## 2019-05-26 ENCOUNTER — Ambulatory Visit (INDEPENDENT_AMBULATORY_CARE_PROVIDER_SITE_OTHER): Payer: BC Managed Care – PPO | Admitting: Adult Health

## 2019-05-26 VITALS — BP 104/62 | Temp 98.5°F | Wt 104.0 lb

## 2019-05-26 DIAGNOSIS — J45990 Exercise induced bronchospasm: Secondary | ICD-10-CM

## 2019-05-26 DIAGNOSIS — K5900 Constipation, unspecified: Secondary | ICD-10-CM

## 2019-05-26 MED ORDER — ALBUTEROL SULFATE HFA 108 (90 BASE) MCG/ACT IN AERS: 2.0000 | INHALATION_SPRAY | Freq: Four times a day (QID) | RESPIRATORY_TRACT | 2 refills | Status: DC | PRN
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
## 2019-06-24 ENCOUNTER — Encounter (HOSPITAL_COMMUNITY): Payer: BC Managed Care – PPO

## 2019-06-28 ENCOUNTER — Other Ambulatory Visit: Payer: Self-pay

## 2019-06-28 ENCOUNTER — Ambulatory Visit (HOSPITAL_COMMUNITY)
Admission: RE | Admit: 2019-06-28 | Discharge: 2019-06-28 | Disposition: A | Discharge status: Home / Self Care | Payer: BC Managed Care – PPO | Source: Ambulatory Visit | Attending: Surgery | Admitting: Surgery

## 2019-06-28 DIAGNOSIS — Z452 Encounter for adjustment and management of vascular access device: Secondary | ICD-10-CM | POA: Diagnosis not present

## 2019-06-28 MED ORDER — HEPARIN SOD (PORK) LOCK FLUSH 100 UNIT/ML IV SOLN
500.0000 [IU] | INTRAVENOUS | Status: AC | PRN
  Administered 2019-06-28: 500 [IU]

## 2019-06-28 MED ORDER — SODIUM CHLORIDE 0.9% FLUSH
10.0000 mL | INTRAVENOUS | Status: AC | PRN
  Administered 2019-06-28: 10 mL

## 2019-06-28 NOTE — Progress Notes (Signed)
Implanted port flushed per protocol with 10 ml 0.9% Normal saline and 500 units of heparin.Tolerated well,  discharge instructions given, verbalized understanding. Patient alert, oriented and ambulatory at the time of discharge.

## 2019-07-11 DIAGNOSIS — L700 Acne vulgaris: Secondary | ICD-10-CM | POA: Diagnosis not present

## 2019-07-18 ENCOUNTER — Encounter: Payer: Self-pay | Admitting: Neurology

## 2019-07-18 ENCOUNTER — Telehealth (INDEPENDENT_AMBULATORY_CARE_PROVIDER_SITE_OTHER): Payer: BC Managed Care – PPO | Admitting: Neurology

## 2019-07-18 ENCOUNTER — Other Ambulatory Visit: Payer: Self-pay

## 2019-07-18 VITALS — Ht 63.0 in | Wt 104.0 lb

## 2019-07-18 DIAGNOSIS — G40019 Localization-related (focal) (partial) idiopathic epilepsy and epileptic syndromes with seizures of localized onset, intractable, without status epilepticus: Secondary | ICD-10-CM | POA: Diagnosis not present

## 2019-07-18 DIAGNOSIS — G43009 Migraine without aura, not intractable, without status migrainosus: Secondary | ICD-10-CM

## 2019-07-18 MED ORDER — FYCOMPA 4 MG PO TABS: 2.0000 | ORAL_TABLET | Freq: Every evening | ORAL | 5 refills | Status: DC

## 2019-07-18 MED ORDER — LEVETIRACETAM 500 MG PO TABS: ORAL_TABLET | ORAL | 11 refills | Status: DC

## 2019-07-18 MED ORDER — VIMPAT 100 MG PO TABS: 1.0000 | ORAL_TABLET | Freq: Two times a day (BID) | ORAL | 5 refills | Status: DC

## 2019-07-18 MED ORDER — LORAZEPAM 0.5 MG PO TABS: ORAL_TABLET | ORAL | 5 refills | Status: DC

## 2019-07-18 MED ORDER — NORTRIPTYLINE HCL 10 MG PO CAPS: 10.0000 mg | ORAL_CAPSULE | Freq: Every day | ORAL | 11 refills | Status: DC

## 2019-07-18 NOTE — Progress Notes (Signed)
Virtual Visit via Video Note The purpose of this virtual visit is to provide medical care while limiting exposure to the novel coronavirus.    Consent was obtained for video visit:  Yes.   Answered questions that patient had about telehealth interaction:  Yes.   I discussed the limitations, risks, security and privacy concerns of performing an evaluation and management service by telemedicine. I also discussed with the patient that there may be a patient responsible charge related to this service. The patient expressed understanding and agreed to proceed.  Pt location: Home Physician Location: office Name of referring provider:  Dorothyann Peng, NP I connected with [name] at patients initiation/request on 07/18/2019 at  2:00 PM EDT by video enabled telemedicine application and verified that I am speaking with the correct person using two identifiers. Pt MRN:  [id number] Pt DOB:  [birth year] Video Participants:  [name]   History of Present Illness:  The patient was seen as a virtual video visit on 07/18/2019. 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 On her last visit, we discussed increasing Levetiracetam and weaning off Lamotrigine. She did notice less seizures on additional tablet of LEV, but felt sick to her stomach and foggy. She is back to taking Levetiracetam 500mg  TID. She has weaned off Lamotrigine 7 weeks ago with no significant increase in seizures. She is also on Vimpat 100mg  BID and Fycompa 8mg  qhs. She is overall tolerating medications better than before. She had an accident on her horse in April and had pneumomediastinum, she had an increase in seizures and was told she had a severe concussion. Since then, seizures have definitely been better since she has been home from school. Sleep schedule is better and she is not stressed out. While in [place], she was in the ER multiple times for dislocated shoulder, she has not had this for several months. She has around  3-4 complex partial seizures a month where she drools and may fall. She fell on her right shoulder on to a brick wall 7-8 weeks ago. Her last seizure was 5-6 days ago after she had been out all day. She was also reporting frequent headaches on last visit and was started on nortriptyline 10mg  qhs which has helped. No side effects. 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 She reports that she had a lot of instances of "spacing out" as a teenager, but was not diagnosed with seizures until she had her first generalized tonic-clonic seizure at age 79. She initially did not recognize any warning symptoms, but now feels lightheaded/dizzy "like a white noise," both arms are numb and tingly, then loses consciousness. Witnesses have told her she about complex partial seizures where her eyes are open, she would vocalize, smack her lips, and fidget. After a seizure, her left side feels weaker. She reports that GTCs mostly occur when she is sick or sleep deprived, last GTC was in June. She usually has a couple of complex partial seizures before having a convulsion. One time she had 4 GTCs in a week. She has dislocated her shoulder "hundreds of  times" since age 17, particularly the left shoulder. She has frequent focal seizures occurring at least once a week for the past 1.5-2 years. She reports they cluster for several days in a row, sometimes a couple of times a day. She had one yesterday and the day prior. Previous to this was a week ago. She has been travelling recently and has more seizures when stressed or sleep  deprived. She has noticed that sometimes one of her limbs would jerk a little bit. She would have occasional brief twitching in her arms like a tremor with no associated aura or convulsion, more in the evening hours. She has nocturnal seizures where she wakes up with urinary incontinence every 2 weeks or so. She had tried Tegretol in the past, which was ineffective. She is currently on Lamotrigine 200mg  BID, Keppra 1000, 1000mg , 500mg , and Vimpat 50mg  BID. She has been taking Lamotrigine since age 28, no side effects. She feels different if she takes it an hour late. She has been on Keppra since age 71, and Vimpat was started 1.5 years ago. She feels Vimpat has been the most helpful by far, with less severe convulsions. She gets a little dizzy and nauseated after taking Vimpat. She had a VNS placed for 3.5 weeks, then had it taken out because there were no resources for VNS in [place], [place]. She was told she had to go to [place] 2 hours away to interrogate the device, and special permission was needed for people in school to help swipe her magnet.  She occasionally has gustatory hallucinations of tasting cheese. She gets a lot of nausea but they are not clearly related to the seizures. For the past couple of years, she has headaches a couple of times a week, usually over the frontal and temporal regions where she gets dizzy, vision "gets funky," with nausea and light sensitivity (sometimes triggering headaches).  Naproxen helps. Pain is intense for about 20-30 minutes, then hurts for hours. She has neck and back pain. No clear relation to menstrual cycle, her periods are very irregular, she reports amenorrhea for 4 years at one point and has not had this checked. She constantly feels her left side has less sensation, she has difficulty typing with her left hand. 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 She has some  accommodations where lessons are transcribed for her. She has been having a lot of difficulties with her professors in school. She has a seizure dog who can detect the GTCs but only detect the complex partial seizure "50% of the time." She is concerned that she is getting sick all the time, she is sick at least once a month "I catch everything," and has started to have epistaxis.   Epilepsy Risk Factors:  MERRF. She reports her sister was tested and is a carrier. Otherwise she had a normal birth and early development.  There is no history of febrile convulsions, CNS infections such as meningitis/encephalitis, significant traumatic brain injury, neurosurgical procedures, or family history of seizures.  Prior AEDs: Tegretol, Zonisamide, VNS (taken out because no resources in [place])  EEGs: 08/17/2009 at [facility] was a normal wake EEG 09/09/2009 24-hour EEG at [facility] was normal. Pushbutton events not associated with EEG change MRI: MRI brain without contrast done 04/17/2011 reported as normal, images unavailable for review      Current Outpatient Medications on File Prior to  Visit  Medication Sig Dispense Refill   albuterol (PROVENTIL HFA;VENTOLIN HFA) 108 (90 Base) MCG/ACT inhaler Inhale 1-2 puffs into the lungs every 6 (six) hours as needed for wheezing or shortness of breath.     clindamycin (CLEOCIN T) 1 % lotion Apply 1 application topically See admin instructions. Apply to affected areas of the face 2 times a day  1   clindamycin-benzoyl peroxide (BENZACLIN) gel Apply 1 application topically See admin instructions. Apply to face 2 times a day as directed     ibuprofen (ADVIL,MOTRIN) 200 MG tablet Take 800 mg by mouth every 8 (eight) hours as needed (for cramping or pain).      Lacosamide (VIMPAT) 100 MG TABS Take 1 tablet (100 mg total) by mouth 2 (two) times daily. 60 tablet 5   levETIRAcetam (KEPPRA) 500 MG tablet Take 1 tab in AM, 1 tab at noon, 2 tabs at night (Patient taking differently: Take  1 tab TID 120 tablet 11   LORazepam (ATIVAN) 0.5 MG tablet Take 1 tablet (0.5 mg total) by mouth as needed for anxiety. (Patient taking differently: Take 0.5 mg by mouth every 8 (eight) hours as needed (for seizure control). ) 30 tablet 1   Perampanel (FYCOMPA) 4 MG TABS Take 2 tablets (8 mg total) by mouth every evening. 60 tablet 5   tretinoin (RETIN-A) 0.025 % cream Apply 1 application topically See admin instructions. Apply to face daily as directed  1   triamcinolone lotion (KENALOG) 0.1 % Apply 1 application topically See admin instructions. Apply to scalp as directed 2 times a day  1   No current facility-administered medications on file prior to visit.      Observations/Objective:   Vitals:   07/18/19 1353  Weight: 104 lb (47.2 kg)  Height: 5\' 3"  (1.6 m)   GEN:  The patient appears stated age and is in NAD.  Neurological examination: Patient is awake, alert, oriented x 3. No aphasia or dysarthria. Intact fluency and comprehension. Remote and recent memory intact. Able to name and repeat. Cranial nerves: Extraocular movements intact with no nystagmus. No facial asymmetry. Motor: moves all extremities symmetrically, at least anti-gravity x 4. No incoordination on finger to nose testing. Gait: narrow-based and steady, able to tandem walk adequately. Negative Romberg test.   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 She has had progressive hearing loss since age 48, myoclonic jerks, as well as focal seizures with impaired awareness occurring 1-2 times a week and GTCs. She has continued seizures on multiple AEDs, ER notes in [place] have raised the possibility of psychogenic non-epileptic events. We again discussed doing video EEG monitoring to assess for  co-existing epilepsy and PNES, she would like to hold off for now due to pandemic. We discussed continued seizures but she has had less seizures since home from school, we agreed to continue on Levetiracetam 500mg  TID, Vimpat 100mg  BID, and Fycompa 8mg  qhs. She has prn Ativan for seizure rescue. She has had good response to nortriptyline 10mg  qhs for headache prophylaxis. She does not drive. She will follow-up in 4-5 months and knows to call for any changes.    Follow Up Instructions:   -I discussed the assessment and treatment plan with the patient. The patient was provided  an opportunity to ask questions and all were answered. The patient agreed with the plan and demonstrated an understanding of the instructions.   The patient was advised to call back or seek an in-person evaluation if the symptoms worsen or if the condition fails to improve as anticipated.    Cameron Sprang, MD

## 2019-07-25 ENCOUNTER — Telehealth: Payer: BC Managed Care – PPO | Admitting: Family Medicine

## 2019-07-25 ENCOUNTER — Other Ambulatory Visit: Payer: Self-pay

## 2019-07-25 DIAGNOSIS — Z0289 Encounter for other administrative examinations: Secondary | ICD-10-CM

## 2019-07-29 ENCOUNTER — Emergency Department (HOSPITAL_BASED_OUTPATIENT_CLINIC_OR_DEPARTMENT_OTHER)
Admission: EM | Admit: 2019-07-29 | Discharge: 2019-07-29 | Disposition: A | Discharge status: Home / Self Care | Payer: BC Managed Care – PPO | Attending: Emergency Medicine | Admitting: Emergency Medicine

## 2019-07-29 ENCOUNTER — Encounter (HOSPITAL_BASED_OUTPATIENT_CLINIC_OR_DEPARTMENT_OTHER): Payer: Self-pay

## 2019-07-29 ENCOUNTER — Other Ambulatory Visit: Payer: Self-pay

## 2019-07-29 ENCOUNTER — Emergency Department (HOSPITAL_BASED_OUTPATIENT_CLINIC_OR_DEPARTMENT_OTHER): Payer: BC Managed Care – PPO

## 2019-07-29 DIAGNOSIS — G40909 Epilepsy, unspecified, not intractable, without status epilepticus: Secondary | ICD-10-CM | POA: Diagnosis not present

## 2019-07-29 DIAGNOSIS — Z79899 Other long term (current) drug therapy: Secondary | ICD-10-CM | POA: Insufficient documentation

## 2019-07-29 DIAGNOSIS — R569 Unspecified convulsions: Secondary | ICD-10-CM | POA: Insufficient documentation

## 2019-07-29 DIAGNOSIS — J45909 Unspecified asthma, uncomplicated: Secondary | ICD-10-CM | POA: Insufficient documentation

## 2019-07-29 DIAGNOSIS — S0990XA Unspecified injury of head, initial encounter: Secondary | ICD-10-CM | POA: Diagnosis not present

## 2019-07-29 LAB — CBC WITH DIFFERENTIAL/PLATELET
Abs Immature Granulocytes: 0.01 10*3/uL (ref 0.00–0.07)
Basophils Absolute: 0 10*3/uL (ref 0.0–0.1)
Basophils Relative: 1 %
Eosinophils Absolute: 0.3 10*3/uL (ref 0.0–0.5)
Eosinophils Relative: 5 %
HCT: 38.8 % (ref 36.0–46.0)
Hemoglobin: 13.1 g/dL (ref 12.0–15.0)
Immature Granulocytes: 0 %
Lymphocytes Relative: 39 %
Lymphs Abs: 2.5 10*3/uL (ref 0.7–4.0)
MCH: 29.8 pg (ref 26.0–34.0)
MCHC: 33.8 g/dL (ref 30.0–36.0)
MCV: 88.2 fL (ref 80.0–100.0)
Monocytes Absolute: 0.7 10*3/uL (ref 0.1–1.0)
Monocytes Relative: 11 %
Neutro Abs: 2.9 10*3/uL (ref 1.7–7.7)
Neutrophils Relative %: 44 %
Platelets: 323 10*3/uL (ref 150–400)
RBC: 4.4 MIL/uL (ref 3.87–5.11)
RDW: 12.3 % (ref 11.5–15.5)
WBC: 6.4 10*3/uL (ref 4.0–10.5)
nRBC: 0 % (ref 0.0–0.2)

## 2019-07-29 LAB — COMPREHENSIVE METABOLIC PANEL
ALT: 10 U/L (ref 0–44)
AST: 17 U/L (ref 15–41)
Albumin: 4.4 g/dL (ref 3.5–5.0)
Alkaline Phosphatase: 44 U/L (ref 38–126)
Anion gap: 9 (ref 5–15)
BUN: 9 mg/dL (ref 6–20)
CO2: 24 mmol/L (ref 22–32)
Calcium: 8.9 mg/dL (ref 8.9–10.3)
Chloride: 106 mmol/L (ref 98–111)
Creatinine, Ser: 0.53 mg/dL (ref 0.44–1.00)
GFR calc Af Amer: >60 mL/min (ref >60)
GFR calc non Af Amer: >60 mL/min (ref >60)
Glucose, Bld: 74 mg/dL (ref 70–99)
Potassium: 3.8 mmol/L (ref 3.5–5.1)
Sodium: 139 mmol/L (ref 135–145)
Total Bilirubin: 0.4 mg/dL (ref 0.3–1.2)
Total Protein: 7 g/dL (ref 6.5–8.1)

## 2019-07-29 LAB — PREGNANCY, URINE: Preg Test, Ur: NEGATIVE

## 2019-07-29 LAB — CK: Total CK: 74 U/L (ref 38–234)

## 2019-07-29 MED ORDER — SODIUM CHLORIDE 0.9 % IV SOLN
INTRAVENOUS | Status: DC | PRN
  Administered 2019-07-29: 500 mL via INTRAVENOUS

## 2019-07-29 MED ORDER — SODIUM CHLORIDE 0.9 % IV BOLUS
1000.0000 mL | Freq: Once | INTRAVENOUS | Status: AC
  Administered 2019-07-29: 1000 mL via INTRAVENOUS

## 2019-07-29 MED ORDER — PROMETHAZINE HCL 25 MG/ML IJ SOLN
25.0000 mg | Freq: Once | INTRAMUSCULAR | Status: AC
  Administered 2019-07-29: 25 mg via INTRAVENOUS
  Filled 2019-07-29: qty 1

## 2019-07-29 MED ORDER — LEVETIRACETAM IN NACL 1000 MG/100ML IV SOLN
1000.0000 mg | Freq: Once | INTRAVENOUS | Status: AC
  Administered 2019-07-29: 1000 mg via INTRAVENOUS
  Filled 2019-07-29: qty 100

## 2019-07-29 MED ORDER — LEVETIRACETAM 500 MG PO TABS: 500.0000 mg | ORAL_TABLET | Freq: Every day | ORAL | 0 refills | Status: DC

## 2019-07-29 NOTE — Discharge Instructions (Addendum)
Continue keppra 500 mg in the morning.   You need to take thousand milligrams of Keppra in the evening .  Please take your regular dose in the evening and I have prescribed extra 500 mg to be taken in the evening as well.  You need to call Dr. Delice Lesch on Monday for appointment  No driving.    Return to ER if you have uncontrolled seizures, lethargy, fall.
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
## 2019-07-29 NOTE — ED Notes (Signed)
Patient declines PIV insertion at present time; requests port access; charge nurse [facility] made aware.

## 2019-07-29 NOTE — ED Notes (Signed)
Seizure pads attached to both bed side rails.

## 2019-07-29 NOTE — ED Triage Notes (Signed)
Pt reports having 5 seizures today which is above her baseline-states after hours neuro advised she come to ED-NAD-steady gait

## 2019-09-05 ENCOUNTER — Other Ambulatory Visit: Payer: Self-pay

## 2019-09-05 ENCOUNTER — Ambulatory Visit (HOSPITAL_COMMUNITY)
Admission: RE | Admit: 2019-09-05 | Discharge: 2019-09-05 | Disposition: A | Discharge status: Home / Self Care | Payer: BC Managed Care – PPO | Source: Ambulatory Visit | Attending: Surgery | Admitting: Surgery

## 2019-09-05 DIAGNOSIS — R569 Unspecified convulsions: Secondary | ICD-10-CM | POA: Insufficient documentation

## 2019-09-05 DIAGNOSIS — E8842 MERRF syndrome: Secondary | ICD-10-CM | POA: Insufficient documentation

## 2019-09-05 MED ORDER — SODIUM CHLORIDE 0.9% FLUSH
10.0000 mL | INTRAVENOUS | Status: AC | PRN
  Administered 2019-09-05: 10 mL

## 2019-09-05 MED ORDER — HEPARIN SOD (PORK) LOCK FLUSH 100 UNIT/ML IV SOLN
500.0000 [IU] | INTRAVENOUS | Status: AC | PRN
  Administered 2019-09-05: 500 [IU]
  Filled 2019-09-05: qty 5

## 2019-09-05 NOTE — Progress Notes (Signed)
Patient had an implanted port flushed with 10 ml normal saline and 500 units of heparin per protocol as ordered.Tolerated well, vitals stable, discharge instructions given, verbalized understanding. Patient alert, oriented and ambulatory at the time of discharge.

## 2019-09-21 DIAGNOSIS — M25512 Pain in left shoulder: Secondary | ICD-10-CM | POA: Diagnosis not present

## 2019-09-21 DIAGNOSIS — G5601 Carpal tunnel syndrome, right upper limb: Secondary | ICD-10-CM | POA: Diagnosis not present

## 2019-09-21 DIAGNOSIS — M25531 Pain in right wrist: Secondary | ICD-10-CM | POA: Diagnosis not present

## 2019-09-21 DIAGNOSIS — M25312 Other instability, left shoulder: Secondary | ICD-10-CM | POA: Diagnosis not present

## 2019-09-22 ENCOUNTER — Other Ambulatory Visit: Payer: Self-pay | Admitting: Orthopaedic Surgery

## 2019-09-22 DIAGNOSIS — G5601 Carpal tunnel syndrome, right upper limb: Secondary | ICD-10-CM | POA: Insufficient documentation

## 2019-09-23 ENCOUNTER — Other Ambulatory Visit (HOSPITAL_COMMUNITY): Payer: Self-pay | Admitting: Orthopaedic Surgery

## 2019-09-23 DIAGNOSIS — M25512 Pain in left shoulder: Secondary | ICD-10-CM

## 2019-10-22 ENCOUNTER — Ambulatory Visit (HOSPITAL_COMMUNITY)
Admission: RE | Admit: 2019-10-22 | Discharge: 2019-10-22 | Disposition: A | Discharge status: Home / Self Care | Payer: BC Managed Care – PPO | Source: Ambulatory Visit | Attending: Orthopaedic Surgery | Admitting: Orthopaedic Surgery

## 2019-10-22 DIAGNOSIS — Z01812 Encounter for preprocedural laboratory examination: Secondary | ICD-10-CM | POA: Insufficient documentation

## 2019-10-22 DIAGNOSIS — Z20828 Contact with and (suspected) exposure to other viral communicable diseases: Secondary | ICD-10-CM | POA: Insufficient documentation

## 2019-10-23 LAB — SARS CORONAVIRUS 2 (TAT 6-24 HRS): SARS Coronavirus 2: NEGATIVE

## 2019-10-24 ENCOUNTER — Encounter (HOSPITAL_COMMUNITY): Payer: Self-pay | Admitting: *Deleted

## 2019-10-24 NOTE — Progress Notes (Signed)
I spoke to [name], patient wanted to make sure that MRI is done with conscious sedation not general anesthesia.  I informed patient that it is scheduled for General.  Patient became anxious, "I don't know if it is worth, I have seizures coming out of General Anesthesia, the Dr said it can be done without General anesthesia."  I told patient I would call an anesthesiologist an speak to him about her concerns.  I called Dr. Alver Sorrow said he would call and talk with patient. [name] called me back and said that it would be done under conscious sedation. [name] denies chest pain or shortness of breath. Patient has exercise induced asthma, she reports that she runs 5 miles a day. [name] reports that the last seizure was 2 weeks ago. I instructed patient to take Keppra in am, [name] said she cannot take it without food because it causes N/V.  [name] has bilateral hearing aids, we discussed how to keep them safe.  I encouraged patient to use them to speak with nurses and Drs in am and them send them home with mother.  Patient's mother is not going to wait in the waiting room.

## 2019-10-25 ENCOUNTER — Other Ambulatory Visit: Payer: Self-pay

## 2019-10-25 ENCOUNTER — Encounter (HOSPITAL_COMMUNITY): Admission: RE | Disposition: A | Discharge status: Home / Self Care | Payer: Self-pay | Source: Ambulatory Visit

## 2019-10-25 ENCOUNTER — Ambulatory Visit (HOSPITAL_COMMUNITY): Payer: BC Managed Care – PPO | Admitting: Certified Registered Nurse Anesthetist

## 2019-10-25 ENCOUNTER — Encounter (HOSPITAL_COMMUNITY): Payer: Self-pay | Admitting: *Deleted

## 2019-10-25 ENCOUNTER — Ambulatory Visit (HOSPITAL_COMMUNITY)
Admission: RE | Admit: 2019-10-25 | Discharge: 2019-10-25 | Disposition: A | Discharge status: Home / Self Care | Payer: BC Managed Care – PPO | Source: Ambulatory Visit | Attending: Orthopaedic Surgery | Admitting: Orthopaedic Surgery

## 2019-10-25 DIAGNOSIS — M25512 Pain in left shoulder: Secondary | ICD-10-CM

## 2019-10-25 DIAGNOSIS — G8929 Other chronic pain: Secondary | ICD-10-CM | POA: Insufficient documentation

## 2019-10-25 DIAGNOSIS — Z881 Allergy status to other antibiotic agents status: Secondary | ICD-10-CM | POA: Diagnosis not present

## 2019-10-25 DIAGNOSIS — R569 Unspecified convulsions: Secondary | ICD-10-CM | POA: Insufficient documentation

## 2019-10-25 DIAGNOSIS — F419 Anxiety disorder, unspecified: Secondary | ICD-10-CM | POA: Diagnosis not present

## 2019-10-25 DIAGNOSIS — G40909 Epilepsy, unspecified, not intractable, without status epilepticus: Secondary | ICD-10-CM | POA: Diagnosis not present

## 2019-10-25 DIAGNOSIS — D649 Anemia, unspecified: Secondary | ICD-10-CM | POA: Diagnosis not present

## 2019-10-25 DIAGNOSIS — J45909 Unspecified asthma, uncomplicated: Secondary | ICD-10-CM | POA: Diagnosis not present

## 2019-10-25 DIAGNOSIS — M25312 Other instability, left shoulder: Secondary | ICD-10-CM | POA: Diagnosis not present

## 2019-10-25 HISTORY — PX: RADIOLOGY WITH ANESTHESIA: SHX6223

## 2019-10-25 HISTORY — DX: Post-traumatic stress disorder, unspecified: F43.10

## 2019-10-25 HISTORY — DX: Concussion with loss of consciousness of unspecified duration, initial encounter: S06.0X9A

## 2019-10-25 HISTORY — DX: Concussion with loss of consciousness status unknown, initial encounter: S06.0XAA

## 2019-10-25 HISTORY — DX: Carpal tunnel syndrome, unspecified upper limb: G56.00

## 2019-10-25 LAB — BASIC METABOLIC PANEL
Anion gap: 9 (ref 5–15)
BUN: 10 mg/dL (ref 6–20)
CO2: 21 mmol/L — ABNORMAL LOW (ref 22–32)
Calcium: 8.8 mg/dL — ABNORMAL LOW (ref 8.9–10.3)
Chloride: 109 mmol/L (ref 98–111)
Creatinine, Ser: 0.61 mg/dL (ref 0.44–1.00)
GFR calc Af Amer: >60 mL/min (ref >60)
GFR calc non Af Amer: >60 mL/min (ref >60)
Glucose, Bld: 91 mg/dL (ref 70–99)
Potassium: 3.6 mmol/L (ref 3.5–5.1)
Sodium: 139 mmol/L (ref 135–145)

## 2019-10-25 LAB — POCT PREGNANCY, URINE: Preg Test, Ur: NEGATIVE

## 2019-10-25 LAB — GLUCOSE, CAPILLARY: Glucose-Capillary: 91 mg/dL (ref 70–99)

## 2019-10-25 SURGERY — MRI WITH ANESTHESIA
Anesthesia: Monitor Anesthesia Care | Laterality: Left

## 2019-10-25 MED ORDER — SODIUM CHLORIDE 0.9% FLUSH
10.0000 mL | INTRAVENOUS | Status: DC | PRN
  Administered 2019-10-25: 10 mL
  Filled 2019-10-25: qty 40

## 2019-10-25 MED ORDER — SODIUM CHLORIDE 0.9% FLUSH: 10.0000 mL | Freq: Two times a day (BID) | INTRAVENOUS | Status: DC

## 2019-10-25 MED ORDER — LORAZEPAM 0.5 MG PO TABS
1.0000 mg | ORAL_TABLET | Freq: Once | ORAL | Status: AC
  Administered 2019-10-25: 1 mg via ORAL
  Filled 2019-10-25: qty 2

## 2019-10-25 MED ORDER — ONDANSETRON HCL 4 MG/2ML IJ SOLN
INTRAMUSCULAR | Status: DC | PRN
  Administered 2019-10-25: 4 mg via INTRAVENOUS

## 2019-10-25 MED ORDER — HEPARIN SOD (PORK) LOCK FLUSH 100 UNIT/ML IV SOLN
500.0000 [IU] | INTRAVENOUS | Status: AC | PRN
  Administered 2019-10-25: 500 [IU]

## 2019-10-25 MED ORDER — PROPOFOL 500 MG/50ML IV EMUL
INTRAVENOUS | Status: DC | PRN
  Administered 2019-10-25: 175 ug/kg/min via INTRAVENOUS

## 2019-10-25 MED ORDER — MIDAZOLAM HCL 5 MG/5ML IJ SOLN
INTRAMUSCULAR | Status: DC | PRN
  Administered 2019-10-25: 2 mg via INTRAVENOUS

## 2019-10-25 NOTE — Transfer of Care (Signed)
Immediate Anesthesia Transfer of Care Note  Patient: [name]  Procedure(s) Performed: MRI  LEFT SHOLDER WITHOUT CONTRAST (Left )  Patient Location: PACU  Anesthesia Type:MAC  Level of Consciousness: awake, alert  and drowsy  Airway & Oxygen Therapy: Patient Spontanous Breathing and Patient connected to nasal cannula oxygen  Post-op Assessment: Report given to RN, Post -op Vital signs reviewed and stable and Patient moving all extremities X 4  Post vital signs: Reviewed and stable  Last Vitals:  Vitals Value Taken Time  BP 106/73 10/25/19 0931  Temp 36.1 C 10/25/19 0930  Pulse 54 10/25/19 0942  Resp 16 10/25/19 0942  SpO2 100 % 10/25/19 0942  Vitals shown include unvalidated device data.  Last Pain:  Vitals:   10/25/19 0930  TempSrc:   PainSc: Asleep      Patients Stated Pain Goal: 3 (04/59/97 7414)  Complications: No apparent anesthesia complications

## 2019-10-25 NOTE — Anesthesia Preprocedure Evaluation (Addendum)
Anesthesia Evaluation  Patient identified by MRN, date of birth, ID band Patient awake    Reviewed: Allergy & Precautions, NPO status , Patient's Chart, lab work & pertinent test results  History of Anesthesia Complications (+) PONV and history of anesthetic complications  Airway Mallampati: I  TM Distance: >3 FB Neck ROM: Full    Dental  (+) Dental Advisory Given, Teeth Intact   Pulmonary asthma , neg recent URI,    breath sounds clear to auscultation       Cardiovascular  Rhythm:Regular  - Left ventricle: The cavity size was normal. Systolic function was   normal. The estimated ejection fraction was in the range of 60%   to 65%. Wall motion was normal; there were no regional wall   motion abnormalities. Left ventricular diastolic function   parameters were normal. - Aortic valve: Transvalvular velocity was within the normal range.   There was no stenosis. There was no regurgitation. - Mitral valve: Transvalvular velocity was within the normal range.   There was no evidence for stenosis. There was no regurgitation. - Right ventricle: The cavity size was normal. Wall thickness was   normal. Systolic function was normal. - Pulmonary arteries: Systolic pressure was within the normal   range. PA peak pressure: 20 mm Hg (S).   Neuro/Psych  Headaches, Seizures -,  PSYCHIATRIC DISORDERS Anxiety  Neuromuscular disease    GI/Hepatic Neg liver ROS,   Endo/Other  negative endocrine ROS  Renal/GU negative Renal ROS     Musculoskeletal   Abdominal   Peds  Hematology  (+) anemia ,   Anesthesia Other Findings   Reproductive/Obstetrics                            Anesthesia Physical Anesthesia Plan  ASA: II  Anesthesia Plan: MAC   Post-op Pain Management:    Induction:   PONV Risk Score and Plan: 3 and Treatment may vary due to age or medical condition and Propofol infusion  Airway Management  Planned: Nasal Cannula  Additional Equipment: None  Intra-op Plan:   Post-operative Plan:   Informed Consent: I have reviewed the patients History and Physical, chart, labs and discussed the procedure including the risks, benefits and alternatives for the proposed anesthesia with the patient or authorized representative who has indicated his/her understanding and acceptance.     Dental advisory given  Plan Discussed with: CRNA and Surgeon  Anesthesia Plan Comments:         Anesthesia Quick Evaluation

## 2019-10-26 ENCOUNTER — Encounter (HOSPITAL_COMMUNITY): Payer: Self-pay | Admitting: Radiology

## 2019-10-28 NOTE — Anesthesia Postprocedure Evaluation (Signed)
Anesthesia Post Note  Patient: [name]  Procedure(s) Performed: MRI  LEFT SHOLDER WITHOUT CONTRAST (Left )     Patient location during evaluation: PACU Anesthesia Type: MAC Level of consciousness: awake and patient cooperative Pain management: pain level controlled Vital Signs Assessment: post-procedure vital signs reviewed and stable Respiratory status: spontaneous breathing, nonlabored ventilation, respiratory function stable and patient connected to nasal cannula oxygen Cardiovascular status: stable and blood pressure returned to baseline Postop Assessment: no apparent nausea or vomiting Anesthetic complications: no    Last Vitals:  Vitals:   10/25/19 1000 10/25/19 1014  BP: 111/77 107/72  Pulse: 62 (!) 58  Resp: 19 14  Temp:  (!) 36.1 C  SpO2: 94% 100%    Last Pain:  Vitals:   10/25/19 1014  TempSrc:   PainSc: 0-No pain                 [name]

## 2019-11-02 DIAGNOSIS — G5601 Carpal tunnel syndrome, right upper limb: Secondary | ICD-10-CM | POA: Diagnosis not present

## 2019-11-02 DIAGNOSIS — M25512 Pain in left shoulder: Secondary | ICD-10-CM | POA: Diagnosis not present

## 2019-11-02 DIAGNOSIS — M25312 Other instability, left shoulder: Secondary | ICD-10-CM | POA: Diagnosis not present

## 2019-11-16 ENCOUNTER — Telehealth (INDEPENDENT_AMBULATORY_CARE_PROVIDER_SITE_OTHER): Payer: BC Managed Care – PPO | Admitting: Neurology

## 2019-11-16 ENCOUNTER — Encounter: Payer: Self-pay | Admitting: Neurology

## 2019-11-16 ENCOUNTER — Other Ambulatory Visit: Payer: Self-pay

## 2019-11-16 VITALS — Ht 63.5 in | Wt 104.0 lb

## 2019-11-16 DIAGNOSIS — G40019 Localization-related (focal) (partial) idiopathic epilepsy and epileptic syndromes with seizures of localized onset, intractable, without status epilepticus: Secondary | ICD-10-CM | POA: Diagnosis not present

## 2019-11-16 DIAGNOSIS — G43009 Migraine without aura, not intractable, without status migrainosus: Secondary | ICD-10-CM

## 2019-11-16 MED ORDER — LEVETIRACETAM 500 MG PO TABS: ORAL_TABLET | ORAL | 11 refills | Status: DC

## 2019-11-16 MED ORDER — CLOBAZAM 10 MG PO TABS: ORAL_TABLET | ORAL | 5 refills | Status: DC

## 2019-11-16 MED ORDER — FYCOMPA 4 MG PO TABS: 2.0000 | ORAL_TABLET | Freq: Every evening | ORAL | 5 refills | Status: DC

## 2019-11-16 MED ORDER — VIMPAT 100 MG PO TABS: 1.0000 | ORAL_TABLET | Freq: Two times a day (BID) | ORAL | 5 refills | Status: DC

## 2019-11-16 MED ORDER — LORAZEPAM 0.5 MG PO TABS: ORAL_TABLET | ORAL | 5 refills | Status: DC

## 2019-11-16 MED ORDER — NORTRIPTYLINE HCL 10 MG PO CAPS: ORAL_CAPSULE | ORAL | 11 refills | Status: DC

## 2019-11-16 NOTE — Progress Notes (Signed)
Virtual Visit via Video Note The purpose of this virtual visit is to provide medical care while limiting exposure to the novel coronavirus.    Consent was obtained for video visit:  Yes.   Answered questions that patient had about telehealth interaction:  Yes.   I discussed the limitations, risks, security and privacy concerns of performing an evaluation and management service by telemedicine. I also discussed with the patient that there may be a patient responsible charge related to this service. The patient expressed understanding and agreed to proceed.  Pt location: Home Physician Location: office Name of referring provider:  Dorothyann Peng, NP I connected with [name] at patients initiation/request on 11/16/2019 at  3:30 PM EST by video enabled telemedicine application and verified that I am speaking with the correct person using two identifiers. Pt MRN:  [id number] Pt DOB:  1991-10-19 Video Participants:  [name]   History of Present Illness:  The patient was seen as a virtual video visit on 11/16/2019. 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 She is on 3 AEDs with continued seizures. She is taking Levetiracetam 500mg  TID, Vimpat 100mg  BID, Fycompa 8mg  qhs. She was in the ER on 8/14 for a GTC with prolonged confusion after, worrying her mother. Her speech was slurred and she was not quite lucid. She had hit her head on the bed frame and landed on the bedroom carpet. She had a mild left scalp hematoma, head CT unremarkable. She reports another GTC at the beginning of October. She feels these are still less than typical, she is less stressed living at home. She has a complex partial seizure every 7-10 days, which she feels is good. She has noticed they peak 5-6 days before her menstrual period. She is overall tolerating seizure medications. She was started on nortriptyline for migraines but stopped it 3 weeks ago since she felt the low dose was not helping. She takes  Tylenol often, with 2 bad headaches this week. She has sleep difficulties. For the past 4-5 weeks, she has also noticed an itchy rash in her neck, abdomen, and arms that comes and goes. No new detergents or diet changes.  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 She reports that she had a lot of instances of "spacing out" as a teenager, but was not diagnosed with seizures until she had her first generalized tonic-clonic seizure at age 28. She initially did not recognize any warning symptoms, but now feels lightheaded/dizzy "like a white noise," both arms are numb and tingly, then loses consciousness. Witnesses have told her she about complex partial seizures where her eyes are open, she would vocalize, smack her lips, and fidget. After a seizure, her left side feels weaker. She reports that GTCs mostly occur when she is sick or sleep deprived, last GTC was in June. She usually has a couple of complex partial seizures before having a convulsion. One time she had 4 GTCs in a week. She has dislocated her shoulder "hundreds of times" since age 28, particularly the left shoulder. She has frequent focal seizures occurring at least once a week for the past 1.5-2 years. She reports they cluster for  several days in a row, sometimes a couple of times a day. She had one yesterday and the day prior. Previous to this was a week ago. She has been travelling recently and has more seizures when stressed or sleep deprived. She has noticed that sometimes one of her limbs would jerk a little bit. She would have occasional brief twitching in her arms like a tremor with no associated  aura or convulsion, more in the evening hours. She has nocturnal seizures where she wakes up with urinary incontinence every 2 weeks or so. She had tried Tegretol in the past, which was ineffective. She is currently on Lamotrigine 200mg  BID, Keppra 1000, 1000mg , 500mg , and Vimpat 50mg  BID. She has been taking Lamotrigine since age 28, no side effects. She feels different if she takes it an hour late. She has been on Keppra since age 28, and Vimpat was started 1.5 years ago. She feels Vimpat has been the most helpful by far, with less severe convulsions. She gets a little dizzy and nauseated after taking Vimpat. She had a VNS placed for 3.5 weeks, then had it taken out because there were no resources for VNS in [place], [place]. She was told she had to go to [place] 2 hours away to interrogate the device, and special permission was needed for people in school to help swipe her magnet.  She occasionally has gustatory hallucinations of tasting cheese. She gets a lot of nausea but they are not clearly related to the seizures. For the past couple of years, she has headaches a couple of times a week, usually over the frontal and temporal regions where she gets dizzy, vision "gets funky," with nausea and light sensitivity (sometimes triggering headaches).  Naproxen helps. Pain is intense for about 20-30 minutes, then hurts for hours. She has neck and back pain. No clear relation to menstrual cycle, her periods are very irregular, she reports amenorrhea for 4 years at one point and has not had this checked. She constantly feels her left side has less sensation, she has difficulty typing with her left hand. 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 She has some accommodations where lessons are transcribed for her. She has been having a lot of difficulties with her professors in school. She has a seizure dog who can detect the GTCs but  only detect the complex partial seizure "50% of the time." She is concerned that she is getting sick all the time, she is sick at least once a month "I catch everything," and has started to have epistaxis.   Epilepsy Risk Factors:  MERRF. She reports her sister was tested and is a carrier. Otherwise she had a normal birth and early development.  There is no history of febrile convulsions, CNS infections such as meningitis/encephalitis, significant traumatic brain injury, neurosurgical procedures, or family history of seizures.  Prior AEDs: Tegretol, Zonisamide, VNS (taken out because no resources in [place], [place])  EEGs: 08/17/2009 at [facility] was a normal wake EEG 09/09/2009 24-hour EEG at [facility] was normal. Pushbutton events not associated with EEG change MRI: MRI brain without contrast done 04/17/2011 reported as normal, images unavailable for review      Current Outpatient Medications on File Prior to Visit  Medication Sig Dispense Refill  . albuterol (PROVENTIL HFA;VENTOLIN HFA) 108 (90 Base) MCG/ACT inhaler Inhale 1-2 puffs into the lungs every 6 (six) hours as needed  for wheezing or shortness of breath.    . clindamycin (CLEOCIN T) 1 % lotion Apply 1 application topically See admin instructions. Apply to affected areas of the face 2 times a day  1  . clindamycin-benzoyl peroxide (BENZACLIN) gel Apply 1 application topically See admin instructions. Apply to face 2 times a day as directed    . ibuprofen (ADVIL,MOTRIN) 200 MG tablet Take 800 mg by mouth every 8 (eight) hours as needed (for cramping or pain).     . Lacosamide (VIMPAT) 100 MG TABS Take 1 tablet (100 mg total) by mouth 2 (two) times daily. 60 tablet 5  . levETIRAcetam (KEPPRA) 500 MG tablet Take 1 tab in AM, 1 tab at noon, 2 tabs at night (Patient taking differently: Take 1 tab TID 120 tablet 11  . LORazepam (ATIVAN) 0.5 MG tablet Take 1 tablet (0.5 mg total) by mouth as needed for anxiety. (Patient taking differently: Take 0.5 mg by mouth  every 8 (eight) hours as needed (for seizure control). ) 30 tablet 1  . Perampanel (FYCOMPA) 4 MG TABS Take 2 tablets (8 mg total) by mouth every evening. 60 tablet 5  . tretinoin (RETIN-A) 0.025 % cream Apply 1 application topically See admin instructions. Apply to face daily as directed  1  . triamcinolone lotion (KENALOG) 0.1 % Apply 1 application topically See admin instructions. Apply to scalp as directed 2 times a day  1   No current facility-administered medications on file prior to visit.      Observations/Objective:   Vitals:   11/16/19 1449  Weight: 104 lb (47.2 kg)  Height: 5' 3.5" (1.613 m)   GEN:  The patient appears stated age and is in NAD.  Neurological examination: Patient is awake, alert, oriented x 3. No aphasia or dysarthria. Intact fluency and comprehension. Remote and recent memory intact. Able to name and repeat. Cranial nerves: Extraocular movements intact with no nystagmus. No facial asymmetry. Motor: moves all extremities symmetrically, at least anti-gravity x 4. No incoordination on finger to nose testing.    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 She has had progressive hearing loss since age 89, myoclonic jerks, focal seizures with impaired awareness, and and GTCs. She continues to have seizures on 3 AEDs, but notes there has been significant reduction since being back home with less stress. There is a catamenial component to her seizures, we discussed taking clobazam prophylactically for 5 days every month around the time of her menstrual period, side effects discussed. Continue Levetiracetam 500mg  TID, Vimpat 100mg  BID, and Fycompa 8mg  qhs. She has prn Ativan for seizure rescue. We discussed that if seizures increase in  frequency, would proceed with EMU admission to further classify her seizures. She continues to report headaches and was instructed to resume nortriptyline, increase dose to 20mg  qhs. This may be further uptitrated as necessary. She does not drive. She will follow-up in 4-5 months and knows to call for any changes.    Follow Up Instructions:   -I discussed the assessment and treatment plan with the patient. The patient was provided an opportunity to ask questions and all were answered. The patient agreed with the plan and demonstrated an understanding of the instructions.   The patient was advised to call  back or seek an in-person evaluation if the symptoms worsen or if the condition fails to improve as anticipated.    Cameron Sprang, MD

## 2019-11-21 DIAGNOSIS — D225 Melanocytic nevi of trunk: Secondary | ICD-10-CM | POA: Diagnosis not present

## 2019-11-21 DIAGNOSIS — L249 Irritant contact dermatitis, unspecified cause: Secondary | ICD-10-CM | POA: Diagnosis not present

## 2019-11-21 DIAGNOSIS — L700 Acne vulgaris: Secondary | ICD-10-CM | POA: Diagnosis not present

## 2019-12-11 ENCOUNTER — Other Ambulatory Visit: Payer: Self-pay | Admitting: Neurology

## 2019-12-20 ENCOUNTER — Encounter: Payer: Self-pay | Admitting: Adult Health

## 2019-12-20 ENCOUNTER — Other Ambulatory Visit: Payer: Self-pay

## 2019-12-20 ENCOUNTER — Ambulatory Visit (INDEPENDENT_AMBULATORY_CARE_PROVIDER_SITE_OTHER): Payer: BC Managed Care – PPO | Admitting: Adult Health

## 2019-12-20 VITALS — BP 100/68 | HR 74 | Temp 97.4°F | Ht 63.5 in | Wt 105.0 lb

## 2019-12-20 DIAGNOSIS — J4541 Moderate persistent asthma with (acute) exacerbation: Secondary | ICD-10-CM | POA: Diagnosis not present

## 2019-12-20 DIAGNOSIS — Z23 Encounter for immunization: Secondary | ICD-10-CM | POA: Diagnosis not present

## 2019-12-20 MED ORDER — MONTELUKAST SODIUM 10 MG PO TABS: 10.0000 mg | ORAL_TABLET | Freq: Every day | ORAL | 3 refills | Status: DC
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
## 2019-12-26 ENCOUNTER — Other Ambulatory Visit (HOSPITAL_COMMUNITY)
Admission: RE | Admit: 2019-12-26 | Discharge: 2019-12-26 | Disposition: A | Discharge status: Home / Self Care | Payer: BC Managed Care – PPO | Source: Ambulatory Visit | Attending: Obstetrics & Gynecology | Admitting: Obstetrics & Gynecology

## 2019-12-26 ENCOUNTER — Encounter: Payer: Self-pay | Admitting: Obstetrics & Gynecology

## 2019-12-26 ENCOUNTER — Other Ambulatory Visit: Payer: Self-pay

## 2019-12-26 ENCOUNTER — Ambulatory Visit: Payer: BC Managed Care – PPO | Admitting: Obstetrics & Gynecology

## 2019-12-26 VITALS — BP 102/60 | HR 68 | Temp 98.1°F | Ht 63.25 in

## 2019-12-26 DIAGNOSIS — N921 Excessive and frequent menstruation with irregular cycle: Secondary | ICD-10-CM | POA: Diagnosis not present

## 2019-12-26 DIAGNOSIS — G40309 Generalized idiopathic epilepsy and epileptic syndromes, not intractable, without status epilepticus: Secondary | ICD-10-CM

## 2019-12-26 DIAGNOSIS — E8842 MERRF syndrome: Secondary | ICD-10-CM

## 2019-12-26 DIAGNOSIS — Z124 Encounter for screening for malignant neoplasm of cervix: Secondary | ICD-10-CM | POA: Diagnosis not present

## 2019-12-26 DIAGNOSIS — N946 Dysmenorrhea, unspecified: Secondary | ICD-10-CM | POA: Diagnosis not present

## 2019-12-26 DIAGNOSIS — G40019 Localization-related (focal) (partial) idiopathic epilepsy and epileptic syndromes with seizures of localized onset, intractable, without status epilepticus: Secondary | ICD-10-CM

## 2019-12-26 DIAGNOSIS — G43009 Migraine without aura, not intractable, without status migrainosus: Secondary | ICD-10-CM

## 2019-12-26 DIAGNOSIS — J4520 Mild intermittent asthma, uncomplicated: Secondary | ICD-10-CM
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
## 2019-12-27 ENCOUNTER — Telehealth: Payer: Self-pay | Admitting: Obstetrics & Gynecology

## 2019-12-27 LAB — CYTOLOGY - PAP: Diagnosis: NEGATIVE

## 2019-12-27 NOTE — Telephone Encounter (Signed)
Spoke with patient. Patient was seen for AEX 12/26/19. Reports she woke up this morning at 4am with vaginal bleeding and cramping. Took 2,500mg  Tylenol at 5am and again at 9am, pain is now 8/10. Nausea with bile emesis at 6:45am. Not SA, abstinence for contraceptive. Endo of last menses was 12/04/19, reports Hx of irregular menses. Bleeding is heavy, changing saturated super tampon q30-45 min. Denies lightheadedness, weakness, fatigue, dizziness.   Advised patient she has exceeded daily max for acetaminophen, advised not to take anymore. Will review with Dr. Sabra Heck and return call with recommendations. Patient agreeable.

## 2019-12-27 NOTE — Telephone Encounter (Signed)
Patient is calling regarding abnormal bleeding. Patient stated that she finished her cycle on 12/14/2019. Patient stated that she came in for her first GYN exam yesterday and woke up around 4am with bleeding. Patient stated that the bleeding is more than a regular cycle and she is experiencing a lot of cramping.

## 2019-12-27 NOTE — Telephone Encounter (Signed)
Call reviewed with Dr. Sabra Heck, call returned to patient. Patient is agreeable to Aygestin 5 mg bid.  Instructed patient to call to provide update on symptoms. Advised again not to take anymore acetaminophen today. Patient verbalizes understanding and is agreeable.     Rx pended for Aygestin. Dr. Sabra Heck -please review contraindications.   Drug-Drug: cloBAZam and norethindrone Plasma concentrations and pharmacologic effects of Hormonal Contraceptives may be decreased by Clobazam. Menstrual irregularities and/or contraceptive failure may occur

## 2019-12-27 NOTE — Telephone Encounter (Signed)
Patient calling for update on prescription for Aygestin.

## 2019-12-27 NOTE — Telephone Encounter (Signed)
Spoke with patient. Advised there is a contraindication with Clobazam and norethindrone. Waiting for Dr. Sabra Heck to review and advise if ok to proceed or if alternative medication is needed. Will return call to provide update once Rx has been sent. Patient agreeable.

## 2019-12-27 NOTE — Telephone Encounter (Signed)
Reviewed with Dr. Sabra Heck, call returned to patient. Advised Dr. Sabra Heck is awaiting recommendations from neurologist due to the contraindications to clobazam. Our office will f/u once this has been reviewed. Patient reports bleeding has slowed since this morning. Changing regular tampon q1.5 hrs, blood is bright red. Pain 7/10, taking motrin prn. Patient asking if she can take acetaminophen again today? Advised patient she has exceeded the max limit for acetaminophen for 24 hours, do not take anymore acetaminophen, max dose in 24 hours should not exceed 4000 mg. Advised can take motrin 400 mg q4 hrs OR 800 mg q8h PRN. Patient agreeable.   Routing to Dr. Lestine Box.

## 2019-12-28 ENCOUNTER — Other Ambulatory Visit: Payer: Self-pay

## 2019-12-28 ENCOUNTER — Ambulatory Visit (HOSPITAL_COMMUNITY)
Admission: RE | Admit: 2019-12-28 | Discharge: 2019-12-28 | Disposition: A | Discharge status: Home / Self Care | Payer: BC Managed Care – PPO | Source: Ambulatory Visit | Attending: Internal Medicine | Admitting: Internal Medicine

## 2019-12-28 ENCOUNTER — Other Ambulatory Visit: Payer: Self-pay | Admitting: Adult Health

## 2019-12-28 ENCOUNTER — Telehealth: Payer: Self-pay | Admitting: Adult Health

## 2019-12-28 DIAGNOSIS — Z452 Encounter for adjustment and management of vascular access device: Secondary | ICD-10-CM | POA: Diagnosis not present

## 2019-12-28 MED ORDER — SODIUM CHLORIDE 0.9% FLUSH
10.0000 mL | INTRAVENOUS | Status: AC | PRN
  Administered 2019-12-28: 08:00:00 10 mL

## 2019-12-28 MED ORDER — HEPARIN SOD (PORK) LOCK FLUSH 100 UNIT/ML IV SOLN
500.0000 [IU] | INTRAVENOUS | Status: AC | PRN
  Administered 2019-12-28: 500 [IU]
  Filled 2019-12-28: qty 5

## 2019-12-28 NOTE — Progress Notes (Signed)
PATIENT CARE CENTER NOTE   Provider: Alphonsa Overall, MD   Procedure: Implanted port cath flushed   Note: Implanted port cath flushed with 10 cc normal saline and 500 units of heparin per protocol. No blood return noted. Nydia Bouton MD's office contacted. Spoke with [employer]. Per MD, RN should proceed with the flush. Tolerated well, vitals stable, discharge instructions given, verbalized understanding. Patient alert, oriented and ambulatory at the time of discharge.

## 2019-12-28 NOTE — Telephone Encounter (Signed)
Message routed to PCP.

## 2019-12-28 NOTE — Telephone Encounter (Signed)
Dr. Lucia Gaskins Needs [name] to put in orders for Pt  To have her Portacath flushed. Derrek Monaco advise

## 2019-12-29 NOTE — Telephone Encounter (Signed)
Call to patient to provide update. Will f/u once neurology provides recommendations.   Patient states she was apprehensive about trying medication for cycles because she does not want a hormone that she will be off of at any point, would like to stay on something continuously. Does not want to change seizure medications either.   Reports bleeding has slowed today, changes regular tampon q1.5 -2hrs. Pain 6/10, taking acetaminophen and using a heating pad. Has taken 3000 mg of acetaminophen today. Again advised patient of acetaminophen daily recommendations, should not exceed 4,000 mg in 24hrs. Patient verbalizes understanding.   Will update Dr. Sabra Heck and f/u.

## 2019-12-30 NOTE — Telephone Encounter (Signed)
Please let pt know I've communicated with both Dr. Delice Lesch and Netta Corrigan, her PCP.  Both agree that hysterectomy is premature.  Dr. Delice Lesch is comfortable with Depo Provera and progesterone IUD at this time.  She has previously recommended some additional EEG testing to evaluated seizure activity and she continues to encourage this.  This would be helpful, especially if pt feels seizure activity is changing with progesterone methods, for documentation purposes especially for documenting the necessity for something more agressive like hysterectomy.    She should not take any more than 4grams of tylenol a day.  She should take 2 ibuprofen (400mg ) with one tylenol (500mg ) and she can take this every 4 hours for pain.  I am not comfortable with anything stronger for her.

## 2019-12-30 NOTE — Telephone Encounter (Signed)
Spoke with patient, advised per Dr. Sabra Heck. Patient request to further discuss depo and IUD before making her decision. MyChart visit scheduled for 1/21 at 4:30pm with Dr. Sabra Heck. Patient declined earlier OV offered. Patient verbalizes understanding and is agreeable.   Routing to provider for final review. Patient is agreeable to disposition. Will close encounter.

## 2020-01-01 ENCOUNTER — Encounter: Payer: Self-pay | Admitting: Obstetrics & Gynecology

## 2020-01-05 ENCOUNTER — Telehealth (INDEPENDENT_AMBULATORY_CARE_PROVIDER_SITE_OTHER): Payer: BC Managed Care – PPO | Admitting: Obstetrics & Gynecology

## 2020-01-05 DIAGNOSIS — Z30431 Encounter for routine checking of intrauterine contraceptive device: Secondary | ICD-10-CM

## 2020-01-05 DIAGNOSIS — N946 Dysmenorrhea, unspecified: Secondary | ICD-10-CM | POA: Diagnosis not present

## 2020-01-05 DIAGNOSIS — N921 Excessive and frequent menstruation with irregular cycle: Secondary | ICD-10-CM

## 2020-01-06 ENCOUNTER — Encounter: Payer: Self-pay | Admitting: Obstetrics & Gynecology

## 2020-01-06 NOTE — Telephone Encounter (Signed)
Orders were faxed 12/28/2019 per PCP. Confirmation was received. No further action needed.
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
## 2020-01-11 ENCOUNTER — Telehealth: Payer: Self-pay | Admitting: *Deleted

## 2020-01-11 NOTE — Telephone Encounter (Signed)
Patient calling to schedule iud insertion. °

## 2020-01-11 NOTE — Telephone Encounter (Signed)
Per review MyChart notes dated 01/05/20 Will proceed with IUD placement in the OR with ultrasound present as well.  Would use Kyleena IUD at this point.   Call to patient, patient is requesting to proceed with [facility] IUD insertion in the OR. No longer on menses. Advised patient I will provide update to Dr. Sabra Heck, the business office will follow up to review benefits and [name] Kill, RN will then f/u to discuss scheduling options. Patient agreeable.   Routing to Dr. Sabra Heck  Cc: Magdalene Patricia, [facility] Carder, Reesa Chew, RN

## 2020-01-17 ENCOUNTER — Encounter: Payer: Self-pay | Admitting: Obstetrics & Gynecology

## 2020-01-17 MED ORDER — NORETHINDRONE 0.35 MG PO TABS: 1.0000 | ORAL_TABLET | Freq: Every day | ORAL | 2 refills | Status: DC

## 2020-01-17 NOTE — Telephone Encounter (Signed)
I would suggest starting the POP now as there is so much movement in regards to the opening and closing of the ORs and considering her upcoming travels.  Ok to send in rx for pt.

## 2020-01-17 NOTE — Telephone Encounter (Signed)
Spoke with patient. Menses started again on 01/16/20. Changing pad q4-5 hrs. Patient request update on IUD scheduling in OR? If unable to schedule soon, requesting to start POP. She will be traveling for work 01/31/20 -02/10/20.   Advised patient I will forward request to Dr. Sabra Heck to review and f/u with business office and surgery scheduler. Patient agreeable.   Routing to Dr. Sabra Heck  Cc: Reesa Chew, RN. Hayley Carder

## 2020-01-17 NOTE — Telephone Encounter (Signed)
Rx signed.  Have reviewed with her neurologist all progesterone options which she advised is ok for pt to try.  Thanks.  Ok to close encounter.

## 2020-01-17 NOTE — Telephone Encounter (Signed)
Patient sent the following correspondence through [place].  Hi Dr. Sabra Heck,   I hope you are well. I still haven't received a call to schedule the IUD placement. But from speaking with the office last week, from what I understand it could be a wait until my insurance benefits are approved and there is OR availability time.   I am starting to experience difficulties again. Given if what is above is the case, would it be possible to start on progesterone pills instead and have these called into my pharmacy? If this does not work after a period of months, then to re-visit the IUD placement? Just trying to obtain some method at this point to stop my symptoms as quickly as possible.   Thanks so much, [name]

## 2020-01-17 NOTE — Telephone Encounter (Signed)
Spoke with patient, advised per Dr. Sabra Heck. Patient verbalizes understanding and is agreeable.   Routing pended Rx to Dr. Sabra Heck to review and sign.  Contraindication to clobazam.

## 2020-01-18 NOTE — Telephone Encounter (Signed)
Encounter closed

## 2020-02-14 ENCOUNTER — Telehealth: Payer: Self-pay | Admitting: Adult Health

## 2020-02-14 DIAGNOSIS — Z789 Other specified health status: Secondary | ICD-10-CM

## 2020-02-14 NOTE — Telephone Encounter (Signed)
Can we find out if I can do standing orders for this ?I sent a referral/order in January for this issues

## 2020-02-14 NOTE — Telephone Encounter (Signed)
Pt is needing her [employer] but needs a referral from her PCP in order for it to be done.  She would like the referral to go to Patient Care center over at [facility].  Phone: [phone]  Pt can be reached at [phone]

## 2020-02-15 NOTE — Telephone Encounter (Signed)
[name], how often will she need to have her port flushed?

## 2020-02-16 DIAGNOSIS — Z789 Other specified health status: Secondary | ICD-10-CM | POA: Insufficient documentation

## 2020-02-16 NOTE — Telephone Encounter (Signed)
Ok to fax order

## 2020-02-16 NOTE — Telephone Encounter (Signed)
Called and spoke to the Patient [place] at [facility].  A order can be faxed to [phone].

## 2020-02-16 NOTE — Telephone Encounter (Signed)
Once a month

## 2020-02-16 NOTE — Telephone Encounter (Signed)
Order faxed and received confirmation the transmission was successful.  Patient notified that order was faxed.  Nothing further needed.

## 2020-02-20 ENCOUNTER — Other Ambulatory Visit: Payer: Self-pay

## 2020-02-20 ENCOUNTER — Other Ambulatory Visit (HOSPITAL_COMMUNITY): Payer: Self-pay | Admitting: Obstetrics & Gynecology

## 2020-02-20 ENCOUNTER — Telehealth: Payer: Self-pay | Admitting: Neurology

## 2020-02-20 ENCOUNTER — Telehealth: Payer: Self-pay

## 2020-02-20 ENCOUNTER — Telehealth: Payer: Self-pay | Admitting: *Deleted

## 2020-02-20 ENCOUNTER — Telehealth: Payer: Self-pay | Admitting: Adult Health

## 2020-02-20 ENCOUNTER — Other Ambulatory Visit: Payer: Self-pay | Admitting: Obstetrics & Gynecology

## 2020-02-20 DIAGNOSIS — N920 Excessive and frequent menstruation with regular cycle: Secondary | ICD-10-CM

## 2020-02-20 DIAGNOSIS — N946 Dysmenorrhea, unspecified: Secondary | ICD-10-CM

## 2020-02-20 DIAGNOSIS — N921 Excessive and frequent menstruation with irregular cycle: Secondary | ICD-10-CM

## 2020-02-20 MED ORDER — NORETHINDRONE ACETATE 5 MG PO TABS: 5.0000 mg | ORAL_TABLET | Freq: Two times a day (BID) | ORAL | 0 refills | Status: DC

## 2020-02-20 NOTE — Telephone Encounter (Signed)
Spoke to pt. Pt wanting to give update to Dr Sabra Heck since starting POPs on 01/17/2020 and states had bleeding ever since changing tampons 5 times a day and sometimes bleeding through panties. .Flow goes from light to heavy with cramps. Pt  having headaches. Pt denies pain or fever/chills.   Pt also passed out twice x 2 weeks ago. Pt has not been to urgent care or ER. Pt states does not have license currently and is a [employer] fund[employer]. Pt can do virtual visit with Dr Sabra Heck ,but cant come in for OV. Pt still wanting an IUD placement and  follow up of getting Kyleena IUD placed in OR per Dr Sabra Heck. Will speak to Dr Sabra Heck about plan of care and return call to pt. Pt agreeable.   Pt has CBC scheduled for 02/27/20 at 8:45 am. Pt agreeable. Pt to stop POPs today. Pt verbalized understanding.   Routing to Dr Sabra Heck for review and recommendations.

## 2020-02-20 NOTE — Telephone Encounter (Signed)
Patient left a message stating she had been talk talking to a nurse and has more questions regarding her medication.

## 2020-02-20 NOTE — Telephone Encounter (Signed)
Kyleena placement with ultrasound guidance at [facility] requested for 03/05/2020 at 0730.

## 2020-02-20 NOTE — Telephone Encounter (Signed)
Pt saw the gynecologist that [facility] referred her too. The gyno started her on incassia but the pt wants a second opinion b/c this medication has made it worse so she switched her to Aygestin. Pt is worried about the medication due to her severe seizures and it causing more problems like the last mediation did   Pt has a mychart virtual appt this Wednesday at 8:30 but would like the CMA to call her to let her know if it ok for her not to take until she speaks to [facility]?   Pt can be reached at [phone]

## 2020-02-20 NOTE — Telephone Encounter (Signed)
Spoke back with pt. Pt states having questions for Aygestin and having interactions with current medications for seizures. Pt states pharmacy states Aygestin Rx will make seizure less effective and pt doesn't feel comfortable with Rx.Pt to give call into neuro office to ask about interaction with Keppra.   Routing to Dr Sabra Heck for review and any additional recommendations.

## 2020-02-20 NOTE — Telephone Encounter (Signed)
Patient is having problems with one of her medications.

## 2020-02-20 NOTE — Telephone Encounter (Signed)
Patient called and said she has been prescribed Aygestin 5 MG by gynecologist Dr. Edwinna Areola to help control her irregular bleeding.  She said the pharmacy told her when she picked up the new medication that the Aygestin is contra-indicated to take with her Keppra or seizure medication. It may cause it to be less effictive.  She'd like to discuss this with a nurse or medical assistant to see what Dr. Amparo Bristol recommendations are.

## 2020-02-20 NOTE — Telephone Encounter (Signed)
Spoke to pt. Pt given recommendations by Dr Sabra Heck and highly encouraged to get CBC done as soon as she can before 02/27/2020. Pt given [facility] sites in [facility] [place] near her to have drawn this week. Pt agreeable and orders placed. Pt also given a date of 03/05/2020 at 0730 for IUD insertion in OR for [facility] per Verline Lema, RN for surgery schedule. Pt agreeable to date and time. Verline Lema, RN to call pt with pre-op information and details. Pt agreeable.  Pt also updated on taking Aygestin to help stop bleeding. Pt agreeable and to start with first dose tonight. Aygestin Rx sent to pharmacy on file. # 60, 0RF. Pt given clear instructions to take BID. Pt verbalized understanding.   Routing to Dr Sabra Heck for review. Cc: Verline Lema, RN for surgery schedule.  Encounter closed.

## 2020-02-20 NOTE — Telephone Encounter (Signed)
Spoke with patient. IUD insertion with intraoperative ultrasound guidance scheduled for 03/05/2020 [facility] at 0730. COVID test scheduled for 03/01/2020 at 3 pm. 4 week post op 04/03/2020 at 3:30 pm. Patient is agreeable to all dates and times. Surgery instructions reviewed and mailed to verified home address on file.   Routing to [employer] for [employer].  Routing to provider and will close encounter.

## 2020-02-20 NOTE — Telephone Encounter (Signed)
Agree with CBC.  Would recommend having this done sooner if possible.    Ok to transition to aygestin 5mg  bid.  Go straight from the micronor to this.    Kaitlyn--please give update about surgical scheduling for this pt.

## 2020-02-21 ENCOUNTER — Encounter (HOSPITAL_BASED_OUTPATIENT_CLINIC_OR_DEPARTMENT_OTHER): Payer: Self-pay | Admitting: Oncology

## 2020-02-21 ENCOUNTER — Emergency Department (HOSPITAL_BASED_OUTPATIENT_CLINIC_OR_DEPARTMENT_OTHER): Payer: BC Managed Care – PPO

## 2020-02-21 ENCOUNTER — Emergency Department (HOSPITAL_BASED_OUTPATIENT_CLINIC_OR_DEPARTMENT_OTHER)
Admission: EM | Admit: 2020-02-21 | Discharge: 2020-02-21 | Disposition: A | Discharge status: Home / Self Care | Payer: BC Managed Care – PPO | Attending: Emergency Medicine | Admitting: Emergency Medicine

## 2020-02-21 ENCOUNTER — Other Ambulatory Visit: Payer: Self-pay | Admitting: Obstetrics & Gynecology

## 2020-02-21 ENCOUNTER — Other Ambulatory Visit: Payer: Self-pay

## 2020-02-21 DIAGNOSIS — Y9352 Activity, horseback riding: Secondary | ICD-10-CM | POA: Insufficient documentation

## 2020-02-21 DIAGNOSIS — Y9289 Other specified places as the place of occurrence of the external cause: Secondary | ICD-10-CM | POA: Insufficient documentation

## 2020-02-21 DIAGNOSIS — Y999 Unspecified external cause status: Secondary | ICD-10-CM | POA: Insufficient documentation

## 2020-02-21 DIAGNOSIS — Z91040 Latex allergy status: Secondary | ICD-10-CM | POA: Diagnosis not present

## 2020-02-21 DIAGNOSIS — S4990XA Unspecified injury of shoulder and upper arm, unspecified arm, initial encounter: Secondary | ICD-10-CM | POA: Diagnosis not present

## 2020-02-21 DIAGNOSIS — Z79899 Other long term (current) drug therapy: Secondary | ICD-10-CM | POA: Insufficient documentation

## 2020-02-21 DIAGNOSIS — J45909 Unspecified asthma, uncomplicated: Secondary | ICD-10-CM | POA: Diagnosis not present

## 2020-02-21 DIAGNOSIS — S43004A Unspecified dislocation of right shoulder joint, initial encounter: Secondary | ICD-10-CM | POA: Diagnosis not present

## 2020-02-21 DIAGNOSIS — Z4789 Encounter for other orthopedic aftercare: Secondary | ICD-10-CM | POA: Diagnosis not present

## 2020-02-21 DIAGNOSIS — S4991XA Unspecified injury of right shoulder and upper arm, initial encounter: Secondary | ICD-10-CM | POA: Diagnosis not present

## 2020-02-21 DIAGNOSIS — S43014A Anterior dislocation of right humerus, initial encounter: Secondary | ICD-10-CM

## 2020-02-21 DIAGNOSIS — T1490XA Injury, unspecified, initial encounter: Secondary | ICD-10-CM

## 2020-02-21 MED ORDER — PROPOFOL 10 MG/ML IV BOLUS
INTRAVENOUS | Status: AC | PRN
  Administered 2020-02-21 (×2): 20 mg via INTRAVENOUS
  Administered 2020-02-21: 40 mg via INTRAVENOUS

## 2020-02-21 MED ORDER — PROPOFOL 10 MG/ML IV BOLUS
0.5000 mg/kg | Freq: Once | INTRAVENOUS | Status: AC
  Administered 2020-02-21: 21:00:00 40 mg via INTRAVENOUS
  Administered 2020-02-21: 20 mg via INTRAVENOUS
  Filled 2020-02-21: qty 20

## 2020-02-21 NOTE — Telephone Encounter (Signed)
She can stop the incassia. She can also call any GYN in the [facility] for a second opinion

## 2020-02-21 NOTE — Telephone Encounter (Signed)
Pt called back no answer LVM for her to call the office back

## 2020-02-21 NOTE — ED Notes (Signed)
Pt took 1500 mg of acetaminophen PTA.  Educated pt not to take greater than 4,000 mg of acetaminophen in a 24 hour period.
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
## 2020-02-21 NOTE — Sedation Documentation (Signed)
XR at bedside

## 2020-02-21 NOTE — ED Triage Notes (Signed)
Pt was riding her horse at approximately 1500 today when the horse was spooked and threw pt.  Pt landed on her right side.  States she was wearing a helmet, did hit head and, "See stars." However denies LOC.  Obvious deformity to right shoulder.  Pt rating pain 7/10 shooting.

## 2020-02-21 NOTE — Telephone Encounter (Signed)
Spoke to Kit and advised that she may stop the progesterone per [employer].  Advised she seek a second opinion if needed.  Also advised her to follow up with prescribing doctor.  Nothing further needed.

## 2020-02-21 NOTE — Telephone Encounter (Signed)
Pt called office back and was informed that there are no known interactions between her Keppra or Vimpat with the Aygestin. The Fycompa may reduce the amount of Aygestin so if she is using this as a contraceptive, she will need additional contraception, but otherwise for irregular bleeding it should be fine

## 2020-02-21 NOTE — Telephone Encounter (Signed)
Left a message for a return call.

## 2020-02-21 NOTE — Discharge Instructions (Addendum)
Wear arm sling for the next several days, then slowly begin to reintroduce activity.  Take ibuprofen 600 mg every 6 hours as needed for pain.  Ice for 20 minutes every 2 hours while awake for the next 2 days.  Follow-up with primary doctor/orthopedist if symptoms are not improving in the next 1 to 2 weeks.

## 2020-02-21 NOTE — Telephone Encounter (Signed)
Pls let her know that there are no known interactions between her Keppra or Vimpat with the Aygestin. The Fycompa may reduce the amount of Aygestin so if she is using this as a contraceptive, she will need additional contraception, but otherwise for irregular bleeding it should be fine. Thanks

## 2020-02-22 ENCOUNTER — Telehealth: Payer: Self-pay

## 2020-02-22 ENCOUNTER — Encounter: Payer: Self-pay | Admitting: Adult Health

## 2020-02-22 ENCOUNTER — Telehealth (INDEPENDENT_AMBULATORY_CARE_PROVIDER_SITE_OTHER): Payer: BC Managed Care – PPO | Admitting: Adult Health

## 2020-02-22 ENCOUNTER — Telehealth: Payer: BC Managed Care – PPO | Admitting: Adult Health

## 2020-02-22 DIAGNOSIS — N946 Dysmenorrhea, unspecified: Secondary | ICD-10-CM
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
## 2020-02-22 NOTE — Telephone Encounter (Signed)
Call to patient. Per DPR, OK to leave message on voicemail.   Left voicemail requesting a return call to [place] to review benefits for recommended surgery with M. Suzanne Miller, MD    

## 2020-02-22 NOTE — Telephone Encounter (Signed)
Megan Salon, MD  Nowell Sites, Harley Hallmark, RN  Verline Lema,  I think I want to add hysteroscopy with D&C to this prior to the IUD insertion. She will need a pre-op. Not sure when she can do this. Don't think it should be virtual.   Vinnie Level    Left message to call Verline Lema at [phone]. Hysteroscopy D&C added to case with Maudie Mercury in central scheduling for [facility].

## 2020-02-23 NOTE — Telephone Encounter (Signed)
Spoke with patient regarding surgery benefits. Patient acknowledges understanding of information presented. Patient is aware that benefits presented are for professional benefits only. Patient is aware that once surgery is scheduled, the hospital will call with separate benefits. Patient is aware of surgery cancellation policy.  Patient stated that bleeding has "slowed down." and would like to cancel surgery. Patient stated that she "wants to let her body chill out" after trying the progesterone medication. Patient stated that she plans to seek a second opinion. Patient requested that future appointments be cancelled and that she would call us back if she wants to schedule.   Cc: Dr. Josie Dixon, RN

## 2020-02-23 NOTE — Telephone Encounter (Signed)
All appointments cancelled. Surgery sheet filed.Routing to [place] as Juluis Rainier.

## 2020-02-23 NOTE — Telephone Encounter (Signed)
Spoke with patient. Hysteroscopy D&C added to case. Patient is aware. Pre op appointment scheduled for tomorrow 02/24/2020 at 10 am with Dr.Miller. Patient is agreeable to date and time.

## 2020-02-24 ENCOUNTER — Ambulatory Visit: Payer: BC Managed Care – PPO | Admitting: Obstetrics & Gynecology

## 2020-02-27 ENCOUNTER — Other Ambulatory Visit: Payer: Self-pay

## 2020-02-29 ENCOUNTER — Encounter: Payer: Self-pay | Admitting: *Deleted

## 2020-03-01 ENCOUNTER — Ambulatory Visit (HOSPITAL_COMMUNITY)
Admission: RE | Admit: 2020-03-01 | Discharge: 2020-03-01 | Disposition: A | Discharge status: Home / Self Care | Payer: BC Managed Care – PPO | Source: Ambulatory Visit | Attending: Internal Medicine | Admitting: Internal Medicine

## 2020-03-01 ENCOUNTER — Other Ambulatory Visit: Payer: Self-pay

## 2020-03-01 ENCOUNTER — Other Ambulatory Visit (HOSPITAL_COMMUNITY): Payer: Self-pay

## 2020-03-01 DIAGNOSIS — Z452 Encounter for adjustment and management of vascular access device: Secondary | ICD-10-CM | POA: Insufficient documentation

## 2020-03-01 MED ORDER — SODIUM CHLORIDE 0.9% FLUSH
10.0000 mL | INTRAVENOUS | Status: AC | PRN
  Administered 2020-03-01: 10 mL

## 2020-03-01 MED ORDER — HEPARIN SOD (PORK) LOCK FLUSH 100 UNIT/ML IV SOLN
500.0000 [IU] | INTRAVENOUS | Status: AC | PRN
  Administered 2020-03-01: 500 [IU]

## 2020-03-01 NOTE — Progress Notes (Signed)
Annette came to the [name] today. PAC assessed with positive blood return, heparin locked, and port deaccessed. No complications noted. Pt discharged home ambulatory and A&Ox4.

## 2020-03-05 ENCOUNTER — Ambulatory Visit (HOSPITAL_COMMUNITY): Payer: BC Managed Care – PPO | Attending: Obstetrics & Gynecology

## 2020-03-05 ENCOUNTER — Encounter (HOSPITAL_BASED_OUTPATIENT_CLINIC_OR_DEPARTMENT_OTHER): Payer: Self-pay

## 2020-03-05 ENCOUNTER — Ambulatory Visit (HOSPITAL_BASED_OUTPATIENT_CLINIC_OR_DEPARTMENT_OTHER): Admit: 2020-03-05 | Payer: BC Managed Care – PPO | Admitting: Obstetrics & Gynecology

## 2020-03-05 SURGERY — INSERTION, INTRAUTERINE DEVICE
Anesthesia: Choice

## 2020-03-05 NOTE — Progress Notes (Signed)
Erroneous encounter

## 2020-03-07 DIAGNOSIS — L700 Acne vulgaris: Secondary | ICD-10-CM | POA: Diagnosis not present

## 2020-03-13 ENCOUNTER — Other Ambulatory Visit: Payer: Self-pay | Admitting: Obstetrics & Gynecology

## 2020-03-13 DIAGNOSIS — N921 Excessive and frequent menstruation with irregular cycle: Secondary | ICD-10-CM

## 2020-03-15 ENCOUNTER — Other Ambulatory Visit: Payer: Self-pay | Admitting: Adult Health

## 2020-03-15 DIAGNOSIS — J4541 Moderate persistent asthma with (acute) exacerbation: Secondary | ICD-10-CM

## 2020-03-15 NOTE — Telephone Encounter (Signed)
Pt needs 90 day supply.  Ok to fill for 6 months or a year?

## 2020-03-20 ENCOUNTER — Ambulatory Visit: Payer: BC Managed Care – PPO | Admitting: Cardiology

## 2020-03-20 ENCOUNTER — Other Ambulatory Visit: Payer: Self-pay | Admitting: *Deleted

## 2020-04-02 ENCOUNTER — Other Ambulatory Visit: Payer: Self-pay | Admitting: Obstetrics & Gynecology

## 2020-04-03 ENCOUNTER — Ambulatory Visit: Payer: BC Managed Care – PPO | Admitting: Obstetrics & Gynecology

## 2020-04-03 NOTE — Telephone Encounter (Signed)
Medication refill request: Incassia 0.35 mg  Last AEX:  OV 12/26/2019 Next AEX: 03/08/2021 Last MMG (if hormonal medication request): N/A Refill authorized: Incassia 0.35 mg #3 3 RF pending review

## 2020-04-03 NOTE — Telephone Encounter (Signed)
Please see note about rx being denied due to pt seeing another provider for second opinion.

## 2020-04-03 NOTE — Telephone Encounter (Signed)
Last call from pt states she was going to seek a second opinion.  Please let pt know she should have new provider do prescription for her.  I have declined this to the pharmacy.

## 2020-04-11 NOTE — Telephone Encounter (Signed)
Spoke with patient. Patient states that she is no longer on this medication and does not need a refill. Patient states that she would like to continue seeing Dr.Miller and is not having any current problems or concerns. Does not wish to be seen in office at this time. Patient will call if she needs anything.  Encounter previously closed.

## 2020-04-17 ENCOUNTER — Ambulatory Visit: Payer: BC Managed Care – PPO | Admitting: Neurology

## 2020-04-25 IMAGING — DX PORTABLE CHEST - 1 VIEW
1 series · 1 of 1 positions shown · non-contrast
Comparison: Chest radiograph dated 12/04/2017

CLINICAL DATA: 27-year-old female bucked off horse.

EXAM:
PORTABLE CHEST 1 VIEW

[chest ap]
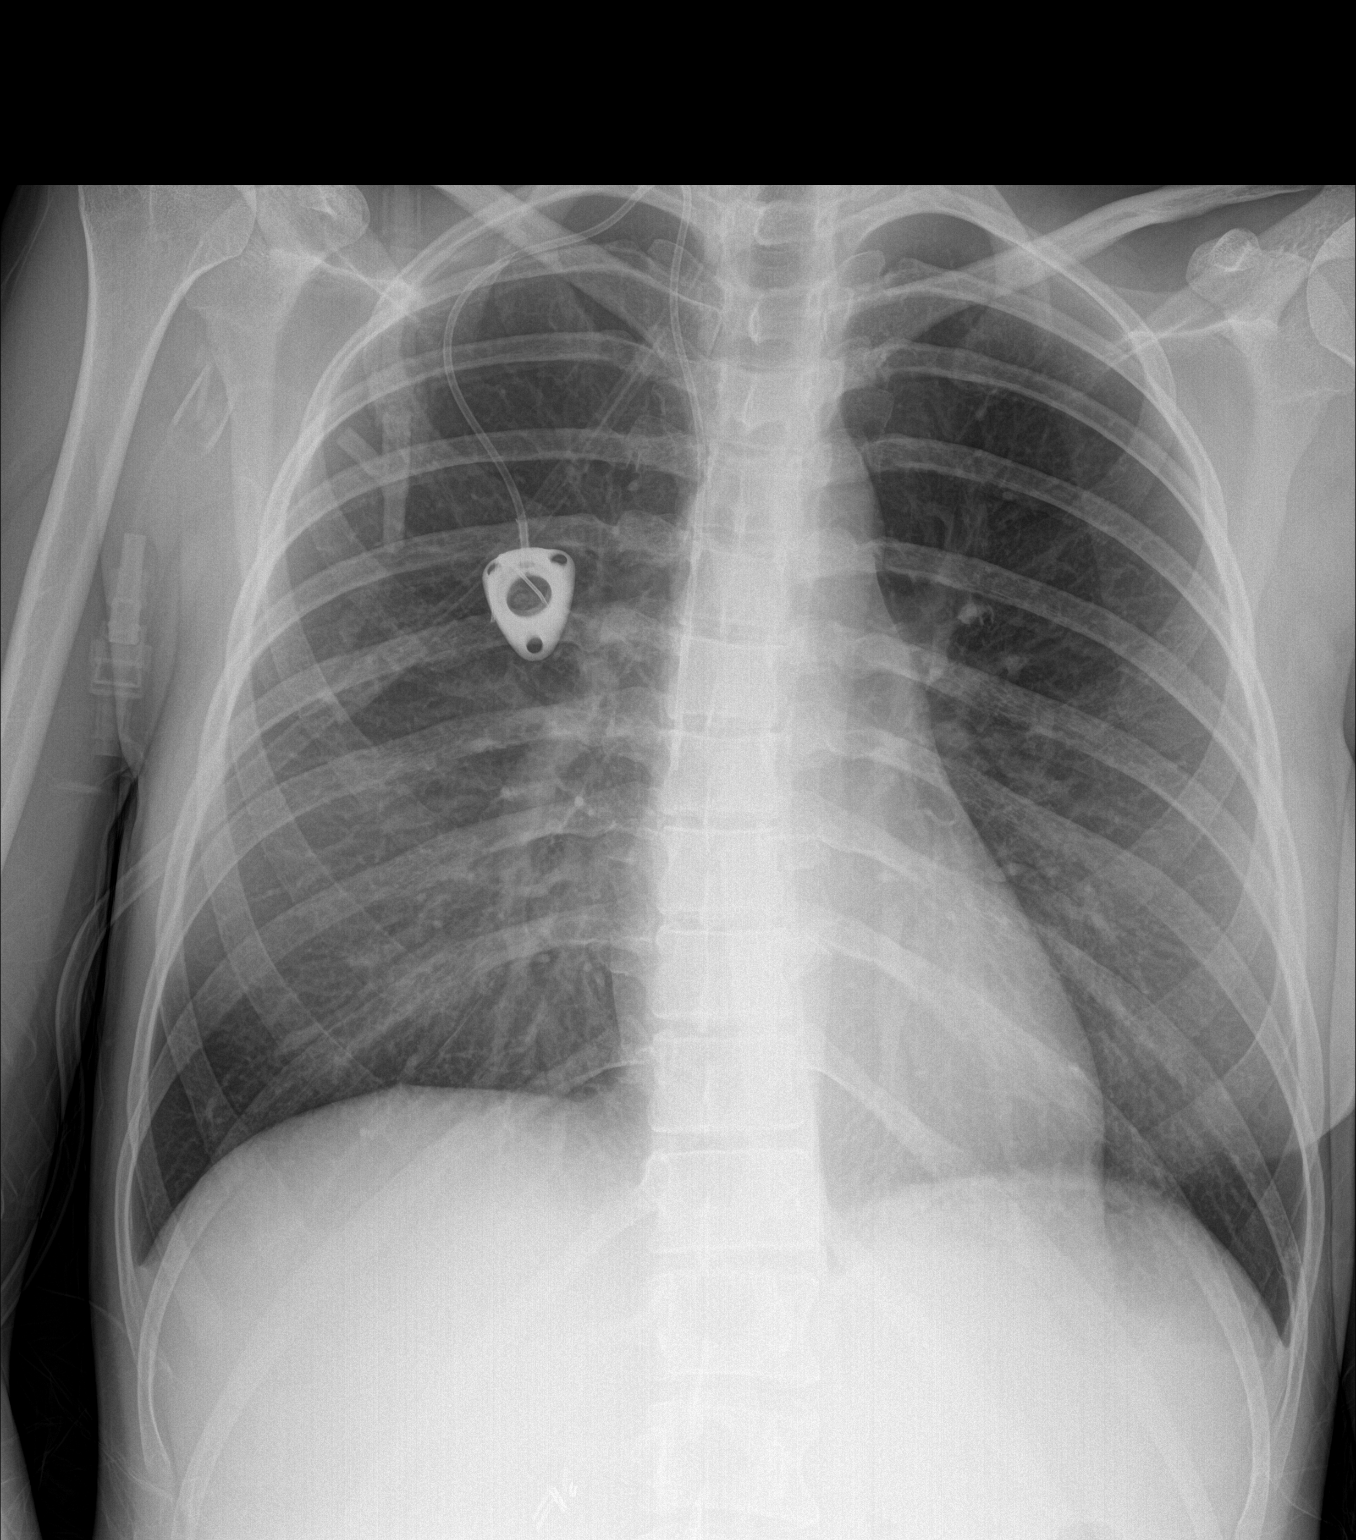

[1 of 1 positions shown; findings below may reference images not displayed]

FINDINGS: Right pectoral Port-A-Cath with tip at the cavoatrial junction. The
lungs are clear. There is no pleural effusion pneumothorax. The
cardiac silhouette is within normal limits. No acute osseous
pathology.
IMPRESSION: No active disease.

## 2020-04-25 IMAGING — CT CT ABDOMEN AND PELVIS WITH CONTRAST
3 of 8 series · 14 of 46 positions shown, 15 images · IV contrast (APPLIED)
Comparison: Chest radiograph March 17, 2019

CLINICAL DATA: Back pain and RIGHT hip pain after fall from horse
today. Shortness of breath and rib pain.

EXAM:
CT CHEST, ABDOMEN, AND PELVIS WITH CONTRAST
CT LUMBAR SPINE WITHOUT CONTRAST
TECHNIQUE: Multidetector CT imaging of the chest, abdomen and pelvis was
performed following the standard protocol during bolus
administration of intravenous contrast. Multiplanar reconstructed
images of the lumbar spine.
CONTRAST:  100mL OMNIPAQUE IOHEXOL 300 MG/ML  SOLN

[Series 2: cap with 2 · axial · 0.70mm/px · z∈[-728,-234]mm · 7 of 133 slices shown, 8 images]
[im 17/133  soft-tissue]
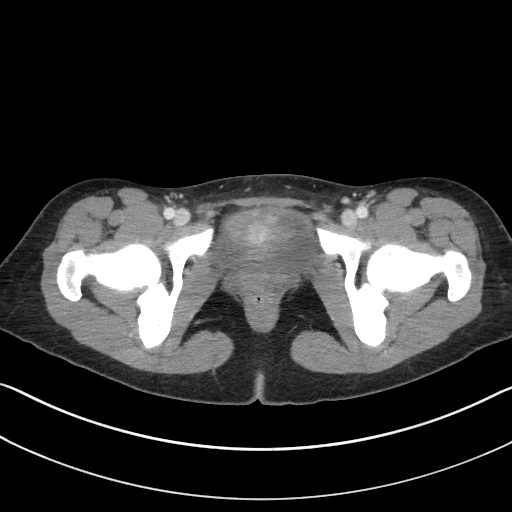
[im 17/133  bone]
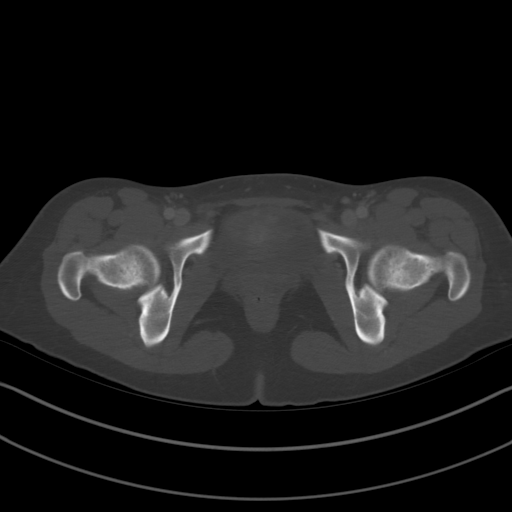
[im 34/133  soft-tissue]
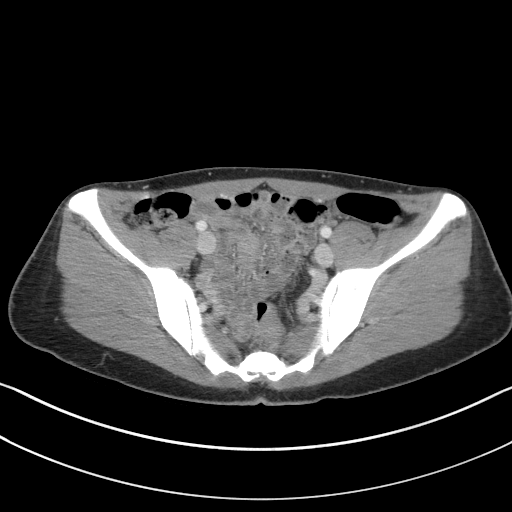
[im 50/133  soft-tissue]
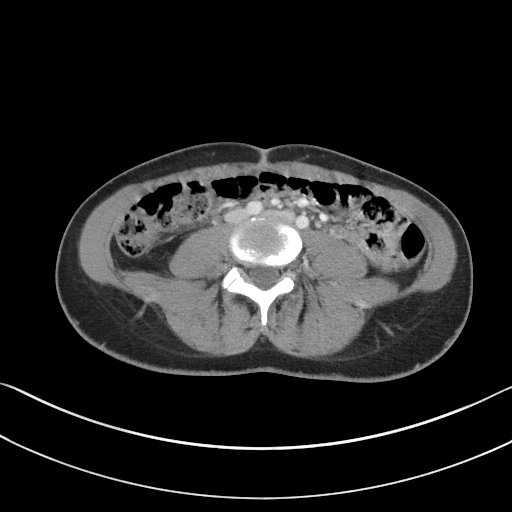
[im 67/133  soft-tissue]
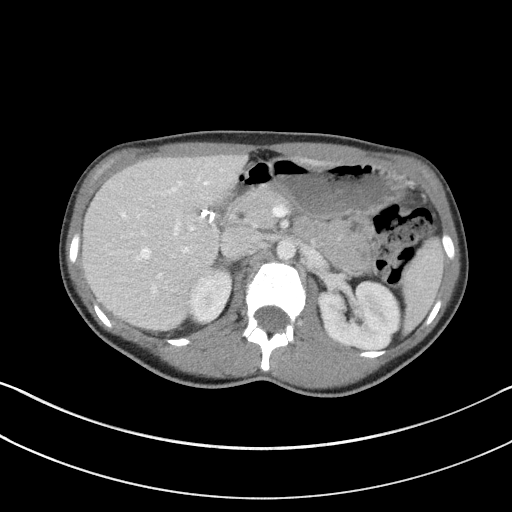
[im 83/133  soft-tissue]
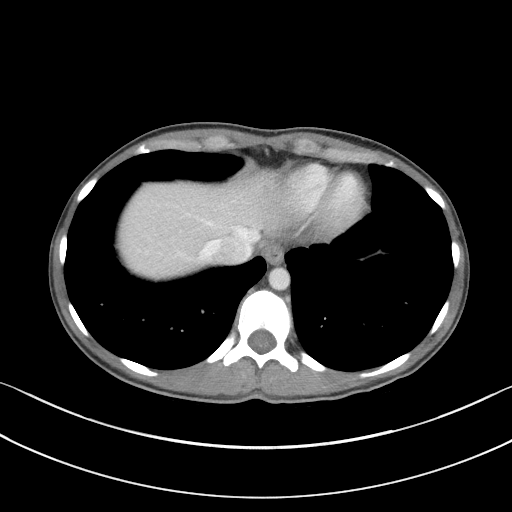
[im 100/133  soft-tissue]
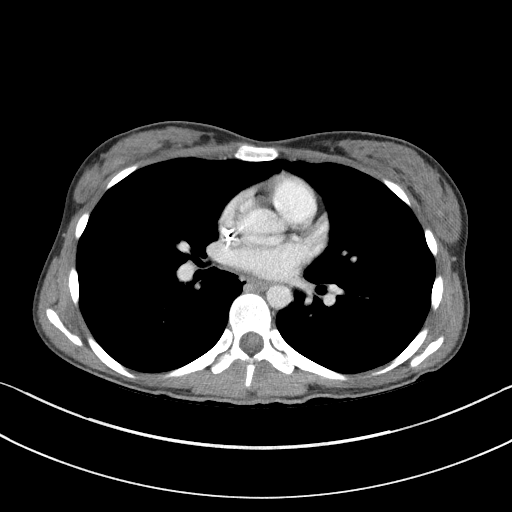
[im 116/133  soft-tissue]
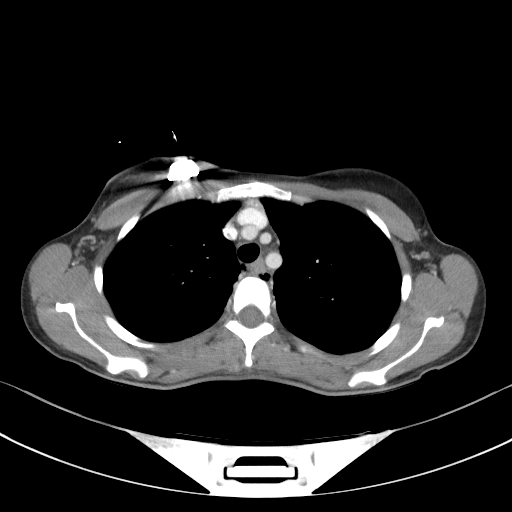

[Series 4: (id) · axial · 0.67mm/px · z∈[-460,-358]mm · 4 of 173 slices shown]
[im 18/173  soft-tissue]
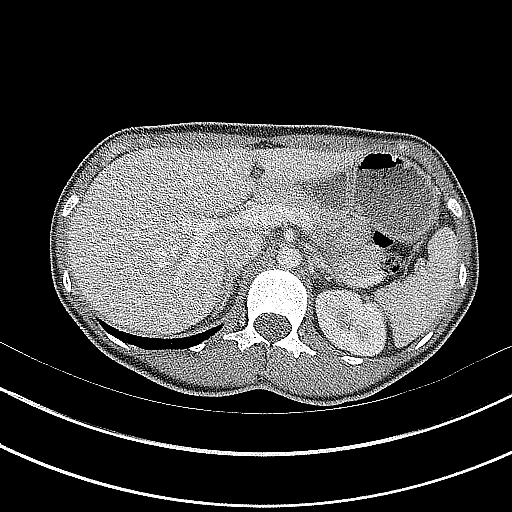
[im 35/173  soft-tissue]
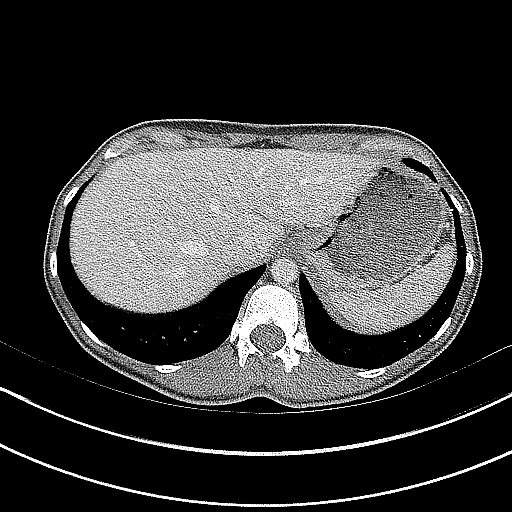
[im 52/173  soft-tissue]
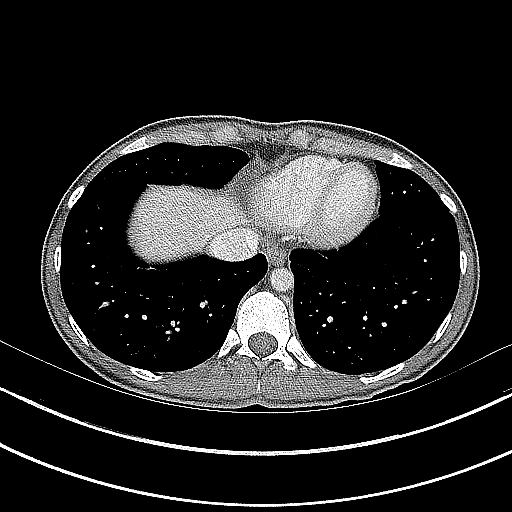
[im 69/173  soft-tissue]
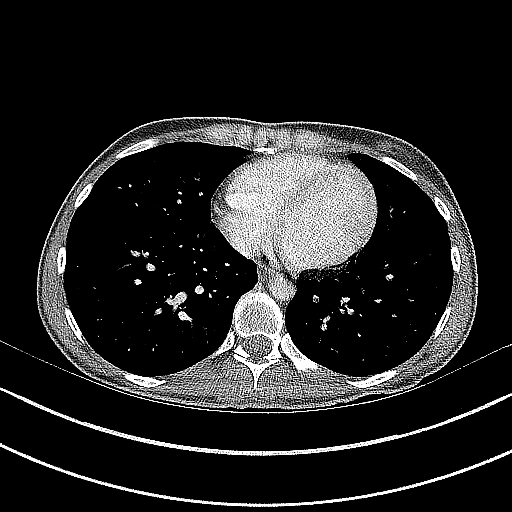

[Series 5: coronals · coronal · 0.64mm/px · 3 of 103 slices shown]
[im 21/103  soft-tissue]
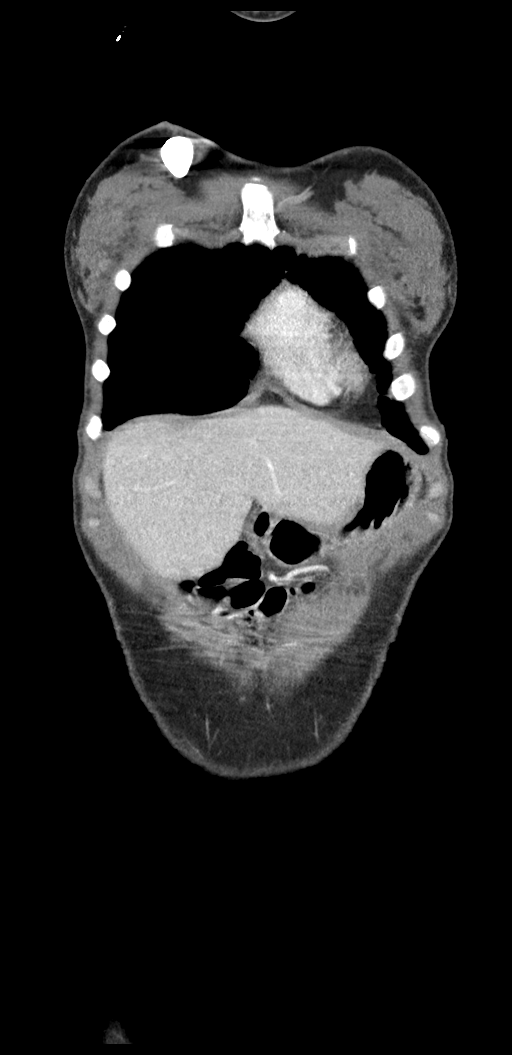
[im 41/103  soft-tissue]
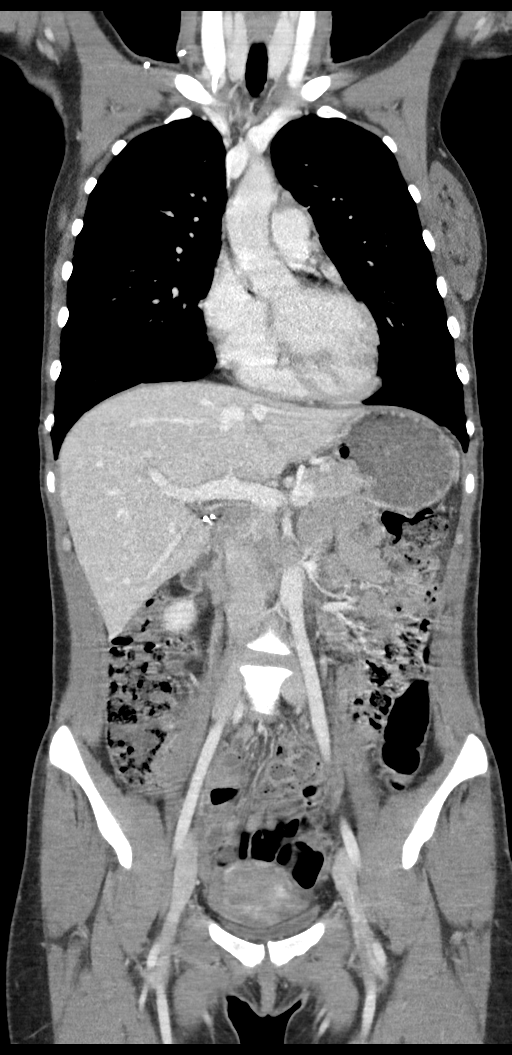
[im 62/103  soft-tissue]
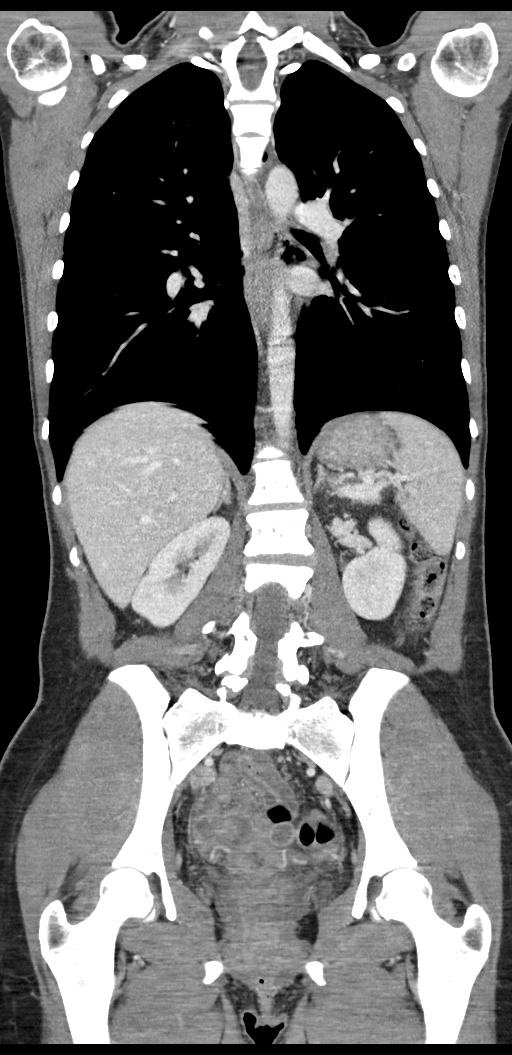

[14 of 46 positions shown; findings below may reference images not displayed]

FINDINGS: CT CHEST FINDINGS

CARDIOVASCULAR: Heart size is normal. No pericardial effusion.
Thoracic aorta is normal course and caliber, unremarkable.

MEDIASTINUM/NODES: Small volume pneumomediastinum slightly tracking
to RIGHT posterior pleural space. No lymphadenopathy by CT size
criteria. Normal appearance of thoracic esophagus though not
tailored for evaluation. RIGHT chest Port-A-Cath. Minimal residual
thymus.

LUNGS/PLEURA: Tracheobronchial tree is patent, no pneumothorax. No
pleural effusions, focal consolidations, pulmonary nodules or
masses.

MUSCULOSKELETAL: Non-acute.

CT ABDOMEN AND PELVIS FINDINGS

HEPATOBILIARY: Status post cholecystectomy.  Normal liver.

PANCREAS: Normal.

SPLEEN: Normal.

ADRENALS/URINARY TRACT: Kidneys are orthotopic, demonstrating
symmetric enhancement. No nephrolithiasis, hydronephrosis or solid
renal masses. The unopacified ureters are normal in course and
caliber. Delayed imaging through the kidneys demonstrates symmetric
prompt contrast excretion within the proximal urinary collecting
system. Urinary bladder is partially distended and unremarkable.
Normal adrenal glands.

STOMACH/BOWEL: The stomach, small and large bowel are normal in
course and caliber without inflammatory changes. Status post
appendectomy.

VASCULAR/LYMPHATIC: Aortoiliac vessels are normal in course and
caliber. No lymphadenopathy by CT size criteria.

REPRODUCTIVE: Normal.  RIGHT ovarian dominant follicle.

OTHER: Small volume low-density free fluid in pelvis within
physiologic range. No intraperitoneal free air or focal fluid
collections.

MUSCULOSKELETAL: Non-acute.

CT LUMBAR SPINE FINDINGS

SEGMENTATION: For the purposes of this report the last well-formed
intervertebral disc space is reported as L5-S1.

ALIGNMENT: Maintained lumbar lordosis. Minimal grade 1 L5-S1
anterolisthesis on the basis of chronic bilateral L5 pars
interarticularis defects. Mild broad levoscoliosis.

VERTEBRAE: Vertebral bodies and posterior elements are intact.
Intervertebral disc heights preserved mild annular calcification at
L4-5. No destructive bony lesions.

PARASPINAL AND OTHER SOFT TISSUES: Included prevertebral and
paraspinal soft tissues are unremarkable.

DISC LEVELS:

No disc bulge, canal stenosis nor neural foraminal narrowing.
IMPRESSION: CT CHEST:

1. Small volume pneumomediastinum. No lung contusion or rib
fractures.

CT ABDOMEN AND PELVIS:

1. No CT findings of acute trauma. No acute intra-abdominopelvic
process.

CT lumbar spine:

1. No acute fracture.
2. Grade 1 L5-S1 anterolisthesis on the basis of chronic bilateral
L5 pars interarticularis defects.

Acute findings discussed with and reconfirmed by PA [place] on

## 2020-05-02 ENCOUNTER — Telehealth: Payer: Self-pay | Admitting: Adult Health

## 2020-05-02 NOTE — Telephone Encounter (Signed)
Pt is calling in needing a referral to have her port flushed at the [place] by [facility]  [phone].

## 2020-05-02 NOTE — Telephone Encounter (Signed)
Order faxed to [phone].  Confirmation received the fax was successful.  Nothing further needed.

## 2020-05-03 ENCOUNTER — Other Ambulatory Visit: Payer: Self-pay

## 2020-05-04 ENCOUNTER — Encounter: Payer: Self-pay | Admitting: Adult Health

## 2020-05-04 ENCOUNTER — Ambulatory Visit (INDEPENDENT_AMBULATORY_CARE_PROVIDER_SITE_OTHER): Payer: BC Managed Care – PPO | Admitting: Adult Health

## 2020-05-04 VITALS — BP 100/72 | Temp 98.6°F | Wt 105.0 lb

## 2020-05-04 DIAGNOSIS — H8113 Benign paroxysmal vertigo, bilateral: Secondary | ICD-10-CM | POA: Diagnosis not present

## 2020-05-04 MED ORDER — ONDANSETRON HCL 4 MG PO TABS: 4.0000 mg | ORAL_TABLET | Freq: Three times a day (TID) | ORAL | 1 refills | Status: DC | PRN

## 2020-05-04 NOTE — Patient Instructions (Addendum)
Your symptoms are consistent with vertigo, you can use Zofran every 4-8 hours for symptom relief. If you are not getting relief over the weekend, get some dramamine from the pharmacy   Also do the attached exercises    Benign Positional Vertigo Vertigo is the feeling that you or your surroundings are moving when they are not. Benign positional vertigo is the most common form of vertigo. This is usually a harmless condition (benign). This condition is positional. This means that symptoms are triggered by certain movements and positions. This condition can be dangerous if it occurs while you are doing something that could cause harm to you or others. This includes activities such as driving or operating machinery. What are the causes? In many cases, the cause of this condition is not known. It may be caused by a disturbance in an area of the inner ear that helps your brain to sense movement and balance. This disturbance can be caused by:  Viral infection (labyrinthitis).  Head injury.  Repetitive motion, such as jumping, dancing, or running. What increases the risk? You are more likely to develop this condition if:  You are a woman.  You are 60 years of age or older. What are the signs or symptoms? Symptoms of this condition usually happen when you move your head or your eyes in different directions. Symptoms may start suddenly, and usually last for less than a minute. They include:  Loss of balance and falling.  Feeling like you are spinning or moving.  Feeling like your surroundings are spinning or moving.  Nausea and vomiting.  Blurred vision.  Dizziness.  Involuntary eye movement (nystagmus). Symptoms can be mild and cause only minor problems, or they can be severe and interfere with daily life. Episodes of benign positional vertigo may return (recur) over time. Symptoms may improve over time. How is this diagnosed? This condition may be diagnosed based on:  Your medical  history.  Physical exam of the head, neck, and ears.  Tests, such as: ? MRI. ? CT scan. ? Eye movement tests. Your health care provider may ask you to change positions quickly while he or she watches you for symptoms of benign positional vertigo, such as nystagmus. Eye movement may be tested with a variety of exams that are designed to evaluate or stimulate vertigo. ? An electroencephalogram (EEG). This records electrical activity in your brain. ? Hearing tests. You may be referred to a health care provider who specializes in ear, nose, and throat (ENT) problems (otolaryngologist) or a provider who specializes in disorders of the nervous system (neurologist). How is this treated?  This condition may be treated in a session in which your health care provider moves your head in specific positions to adjust your inner ear back to normal. Treatment for this condition may take several sessions. Surgery may be needed in severe cases, but this is rare. In some cases, benign positional vertigo may resolve on its own in 2-4 weeks. Follow these instructions at home: Safety  Move slowly. Avoid sudden body or head movements or certain positions, as told by your health care provider.  Avoid driving until your health care provider says it is safe for you to do so.  Avoid operating heavy machinery until your health care provider says it is safe for you to do so.  Avoid doing any tasks that would be dangerous to you or others if vertigo occurs.  If you have trouble walking or keeping your balance, try using a cane  for stability. If you feel dizzy or unstable, sit down right away.  Return to your normal activities as told by your health care provider. Ask your health care provider what activities are safe for you. General instructions  Take over-the-counter and prescription medicines only as told by your health care provider.  Drink enough fluid to keep your urine pale yellow.  Keep all follow-up  visits as told by your health care provider. This is important. Contact a health care provider if:  You have a fever.  Your condition gets worse or you develop new symptoms.  Your family or friends notice any behavioral changes.  You have nausea or vomiting that gets worse.  You have numbness or a "pins and needles" sensation. Get help right away if you:  Have difficulty speaking or moving.  Are always dizzy.  Faint.  Develop severe headaches.  Have weakness in your legs or arms.  Have changes in your hearing or vision.  Develop a stiff neck.  Develop sensitivity to light. Summary  Vertigo is the feeling that you or your surroundings are moving when they are not. Benign positional vertigo is the most common form of vertigo.  The cause of this condition is not known. It may be caused by a disturbance in an area of the inner ear that helps your brain to sense movement and balance.  Symptoms include loss of balance and falling, feeling that you or your surroundings are moving, nausea and vomiting, and blurred vision.  This condition can be diagnosed based on symptoms, physical exam, and other tests, such as MRI, CT scan, eye movement tests, and hearing tests.  Follow safety instructions as told by your health care provider. You will also be told when to contact your health care provider in case of problems. This information is not intended to replace advice given to you by your health care provider. Make sure you discuss any questions you have with your health care provider. Document Revised: 05/12/2018 Document Reviewed: 05/12/2018 Elsevier Patient Education  [place].
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
## 2020-05-07 ENCOUNTER — Other Ambulatory Visit: Payer: Self-pay

## 2020-05-07 ENCOUNTER — Ambulatory Visit (HOSPITAL_COMMUNITY)
Admission: RE | Admit: 2020-05-07 | Discharge: 2020-05-07 | Disposition: A | Discharge status: Home / Self Care | Payer: BC Managed Care – PPO | Source: Ambulatory Visit | Attending: Internal Medicine | Admitting: Internal Medicine

## 2020-05-07 DIAGNOSIS — Z452 Encounter for adjustment and management of vascular access device: Secondary | ICD-10-CM | POA: Diagnosis not present

## 2020-05-07 MED ORDER — SODIUM CHLORIDE 0.9% FLUSH
10.0000 mL | INTRAVENOUS | Status: AC | PRN
  Administered 2020-05-07: 10 mL

## 2020-05-07 MED ORDER — HEPARIN SOD (PORK) LOCK FLUSH 100 UNIT/ML IV SOLN
500.0000 [IU] | INTRAVENOUS | Status: AC | PRN
  Administered 2020-05-07: 500 [IU]

## 2020-05-07 NOTE — Progress Notes (Signed)
PATIENT CARE CENTER NOTE   Provider: Alphonsa Overall, MD   Procedure: Implanted port cath flushed   Note: Implanted port cath flushed with 10 cc normal saline and 500 units of heparin per protocol. Blood return noted.  Tolerated well, vitals stable, patient declined printed AVS. Patient alert, oriented and ambulatory at the time of discharge.

## 2020-05-08 ENCOUNTER — Encounter: Payer: Self-pay | Admitting: Adult Health

## 2020-05-09 ENCOUNTER — Other Ambulatory Visit: Payer: Self-pay | Admitting: Adult Health

## 2020-05-09 ENCOUNTER — Telehealth: Payer: Self-pay | Admitting: Adult Health

## 2020-05-09 MED ORDER — DIAZEPAM 2 MG PO TABS: 2.0000 mg | ORAL_TABLET | Freq: Four times a day (QID) | ORAL | 0 refills | Status: DC | PRN

## 2020-05-09 NOTE — Telephone Encounter (Signed)
Pt also sent a MyChart message and [name] has responded. Nothing further needed.

## 2020-05-09 NOTE — Telephone Encounter (Signed)
Pt was given the msg from [place] that [facility] replied back to her on MyChart

## 2020-05-09 NOTE — Telephone Encounter (Signed)
Last read by [name] at 1:06 PM on 05/09/2020.  Pt has received response.  Will now close the message.

## 2020-05-09 NOTE — Telephone Encounter (Signed)
Pt is experiencing dizziness. Per  Rosanne Ashing, the on-call nurse would like the nurse to call the pt to advise. Please call [phone]

## 2020-05-09 NOTE — Telephone Encounter (Signed)
Pt is calling in stating that her vertigo has gotten worse and has taken the medication that was prescribed and therapy exercises.  Pt would like to see if she could get something different or know what to do from this point.  Pt would like to have a call back to let her know the plan b/c she is not sleeping at night.

## 2020-06-27 ENCOUNTER — Encounter: Payer: Self-pay | Admitting: Adult Health

## 2020-07-03 ENCOUNTER — Ambulatory Visit: Payer: BC Managed Care – PPO | Admitting: Adult Health

## 2020-07-03 NOTE — Telephone Encounter (Signed)
I thought we were doing standing orders for this?

## 2020-07-03 NOTE — Telephone Encounter (Signed)
Pt is calling in needing a referral to have her port flushed at the [facility]  [phone].  Pt stated she does this every 6 weeks

## 2020-07-03 NOTE — Telephone Encounter (Signed)
Confirmed with the [facility] that the pt has standing orders for port flush.  Called and informed the pt.  Nothing further needed.

## 2020-07-05 ENCOUNTER — Emergency Department (HOSPITAL_COMMUNITY): Payer: BC Managed Care – PPO

## 2020-07-05 ENCOUNTER — Encounter (HOSPITAL_COMMUNITY): Payer: Self-pay | Admitting: Emergency Medicine

## 2020-07-05 ENCOUNTER — Emergency Department (HOSPITAL_COMMUNITY)
Admission: EM | Admit: 2020-07-05 | Discharge: 2020-07-05 | Disposition: A | Discharge status: Home / Self Care | Payer: BC Managed Care – PPO | Attending: Emergency Medicine | Admitting: Emergency Medicine

## 2020-07-05 DIAGNOSIS — J0100 Acute maxillary sinusitis, unspecified: Secondary | ICD-10-CM | POA: Diagnosis not present

## 2020-07-05 DIAGNOSIS — S7002XA Contusion of left hip, initial encounter: Secondary | ICD-10-CM | POA: Diagnosis not present

## 2020-07-05 DIAGNOSIS — M25312 Other instability, left shoulder: Secondary | ICD-10-CM | POA: Diagnosis not present

## 2020-07-05 DIAGNOSIS — S0990XA Unspecified injury of head, initial encounter: Secondary | ICD-10-CM | POA: Diagnosis not present

## 2020-07-05 DIAGNOSIS — M7989 Other specified soft tissue disorders: Secondary | ICD-10-CM | POA: Diagnosis not present

## 2020-07-05 DIAGNOSIS — J45909 Unspecified asthma, uncomplicated: Secondary | ICD-10-CM | POA: Insufficient documentation

## 2020-07-05 DIAGNOSIS — Z7951 Long term (current) use of inhaled steroids: Secondary | ICD-10-CM | POA: Diagnosis not present

## 2020-07-05 DIAGNOSIS — Z91040 Latex allergy status: Secondary | ICD-10-CM | POA: Diagnosis not present

## 2020-07-05 DIAGNOSIS — W19XXXA Unspecified fall, initial encounter: Secondary | ICD-10-CM | POA: Diagnosis not present

## 2020-07-05 DIAGNOSIS — S8992XA Unspecified injury of left lower leg, initial encounter: Secondary | ICD-10-CM | POA: Diagnosis not present

## 2020-07-05 DIAGNOSIS — S4992XA Unspecified injury of left shoulder and upper arm, initial encounter: Secondary | ICD-10-CM | POA: Diagnosis not present

## 2020-07-05 DIAGNOSIS — J341 Cyst and mucocele of nose and nasal sinus: Secondary | ICD-10-CM | POA: Diagnosis not present

## 2020-07-05 DIAGNOSIS — R52 Pain, unspecified: Secondary | ICD-10-CM | POA: Diagnosis not present

## 2020-07-05 DIAGNOSIS — S79912A Unspecified injury of left hip, initial encounter: Secondary | ICD-10-CM | POA: Diagnosis not present

## 2020-07-05 DIAGNOSIS — M25512 Pain in left shoulder: Secondary | ICD-10-CM | POA: Insufficient documentation

## 2020-07-05 DIAGNOSIS — S199XXA Unspecified injury of neck, initial encounter: Secondary | ICD-10-CM | POA: Diagnosis not present

## 2020-07-05 DIAGNOSIS — S40012A Contusion of left shoulder, initial encounter: Secondary | ICD-10-CM

## 2020-07-05 DIAGNOSIS — S299XXA Unspecified injury of thorax, initial encounter: Secondary | ICD-10-CM | POA: Diagnosis not present

## 2020-07-05 DIAGNOSIS — J3489 Other specified disorders of nose and nasal sinuses: Secondary | ICD-10-CM | POA: Diagnosis not present

## 2020-07-05 DIAGNOSIS — M25552 Pain in left hip: Secondary | ICD-10-CM | POA: Diagnosis not present

## 2020-07-05 DIAGNOSIS — R4020 Unspecified coma: Secondary | ICD-10-CM | POA: Diagnosis not present

## 2020-07-05 MED ORDER — NAPROXEN 375 MG PO TABS: 375.0000 mg | ORAL_TABLET | Freq: Two times a day (BID) | ORAL | 0 refills | Status: DC

## 2020-07-05 MED ORDER — HYDROMORPHONE HCL 1 MG/ML IJ SOLN
1.0000 mg | Freq: Once | INTRAMUSCULAR | Status: AC
  Administered 2020-07-05: 1 mg via INTRAVENOUS
  Filled 2020-07-05: qty 1
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
## 2020-07-05 NOTE — Discharge Instructions (Addendum)
It was our pleasure to provide your ER care today - we hope that you feel better.  Icepack/cold to sore area.   Take acetaminophen  as need for pain.  Follow up with primary care doctor in 1-2 weeks if symptoms fail to improve/resolve and consider seeing an orthopedic doctor as discussed.  Return to ER if worse, new symptoms, new or severe pain, numbness/weakness, or other concern.   You were given pain medication in the ER - no driving for the next 6 hours.

## 2020-07-05 NOTE — ED Notes (Signed)
Called ortho tech 

## 2020-07-05 NOTE — ED Notes (Signed)
Patient verbalizes understanding of discharge instructions. Opportunity for questioning and answers were provided. Armband removed by staff, pt discharged from ED.  

## 2020-07-05 NOTE — Progress Notes (Signed)
Orthopedic Tech Progress Note Patient Details:  [name] Apr 25, 1991 [id number]  Ortho Devices Type of Ortho Device: Shoulder immobilizer Ortho Device/Splint Location: Upper Left Extremity Ortho Device/Splint Interventions: Ordered, Application, Adjustment   Post Interventions Patient Tolerated: Well Instructions Provided: Adjustment of device, Care of device, Poper ambulation with device   [name] Allie Dimmer 07/05/2020, 5:21 PM

## 2020-07-05 NOTE — ED Provider Notes (Addendum)
X-ray films and CT scans reviewed.  No signs of fracture or dislocation.  Findings were discussed with patient and family member.  Patient is still having shoulder pain and it appears deformed compared to normal.  Likely ligamentous injury. Will place in a sling, refer to orthopedics.       Dorie Rank, MD 07/05/20 [phone]

## 2020-07-09 ENCOUNTER — Emergency Department (HOSPITAL_BASED_OUTPATIENT_CLINIC_OR_DEPARTMENT_OTHER): Payer: BC Managed Care – PPO

## 2020-07-09 ENCOUNTER — Other Ambulatory Visit: Payer: Self-pay

## 2020-07-09 ENCOUNTER — Emergency Department (HOSPITAL_BASED_OUTPATIENT_CLINIC_OR_DEPARTMENT_OTHER)
Admission: EM | Admit: 2020-07-09 | Discharge: 2020-07-09 | Disposition: A | Discharge status: Home / Self Care | Payer: BC Managed Care – PPO | Attending: Emergency Medicine | Admitting: Emergency Medicine

## 2020-07-09 ENCOUNTER — Encounter (HOSPITAL_BASED_OUTPATIENT_CLINIC_OR_DEPARTMENT_OTHER): Payer: Self-pay | Admitting: *Deleted

## 2020-07-09 DIAGNOSIS — Z91040 Latex allergy status: Secondary | ICD-10-CM | POA: Diagnosis not present

## 2020-07-09 DIAGNOSIS — S43015A Anterior dislocation of left humerus, initial encounter: Secondary | ICD-10-CM | POA: Insufficient documentation

## 2020-07-09 DIAGNOSIS — Z79899 Other long term (current) drug therapy: Secondary | ICD-10-CM | POA: Diagnosis not present

## 2020-07-09 DIAGNOSIS — G40909 Epilepsy, unspecified, not intractable, without status epilepticus: Secondary | ICD-10-CM

## 2020-07-09 DIAGNOSIS — J45909 Unspecified asthma, uncomplicated: Secondary | ICD-10-CM | POA: Diagnosis not present

## 2020-07-09 DIAGNOSIS — Y999 Unspecified external cause status: Secondary | ICD-10-CM | POA: Insufficient documentation

## 2020-07-09 DIAGNOSIS — Y929 Unspecified place or not applicable: Secondary | ICD-10-CM | POA: Insufficient documentation

## 2020-07-09 DIAGNOSIS — X58XXXA Exposure to other specified factors, initial encounter: Secondary | ICD-10-CM | POA: Insufficient documentation

## 2020-07-09 DIAGNOSIS — S43005A Unspecified dislocation of left shoulder joint, initial encounter: Secondary | ICD-10-CM | POA: Diagnosis not present

## 2020-07-09 DIAGNOSIS — Y939 Activity, unspecified: Secondary | ICD-10-CM | POA: Diagnosis not present

## 2020-07-09 DIAGNOSIS — S4992XA Unspecified injury of left shoulder and upper arm, initial encounter: Secondary | ICD-10-CM | POA: Diagnosis not present

## 2020-07-09 DIAGNOSIS — M25512 Pain in left shoulder: Secondary | ICD-10-CM | POA: Diagnosis not present

## 2020-07-09 MED ORDER — PROPOFOL 10 MG/ML IV BOLUS
1.0000 mg/kg | Freq: Once | INTRAVENOUS | Status: DC
  Filled 2020-07-09: qty 20

## 2020-07-09 MED ORDER — LORAZEPAM 2 MG/ML IJ SOLN
INTRAMUSCULAR | Status: AC
  Administered 2020-07-09: 1 mg via INTRAVENOUS
  Filled 2020-07-09: qty 1

## 2020-07-09 MED ORDER — HEPARIN SOD (PORK) LOCK FLUSH 100 UNIT/ML IV SOLN
500.0000 [IU] | Freq: Once | INTRAVENOUS | Status: AC
  Administered 2020-07-09: 500 [IU]
  Filled 2020-07-09: qty 5

## 2020-07-09 MED ORDER — LORAZEPAM 2 MG/ML IJ SOLN
INTRAMUSCULAR | Status: AC
  Filled 2020-07-09: qty 1

## 2020-07-09 MED ORDER — LORAZEPAM 2 MG/ML IJ SOLN
1.0000 mg | Freq: Once | INTRAMUSCULAR | Status: AC
  Filled 2020-07-09: qty 1

## 2020-07-09 MED ORDER — PROPOFOL 10 MG/ML IV BOLUS
INTRAVENOUS | Status: AC | PRN
  Administered 2020-07-09: 10 mg via INTRAVENOUS
  Administered 2020-07-09: 30 mg via INTRAVENOUS
  Administered 2020-07-09: 10 mg via INTRAVENOUS

## 2020-07-09 MED ORDER — PROPOFOL 10 MG/ML IV BOLUS
INTRAVENOUS | Status: AC | PRN
  Administered 2020-07-09: 47.6 mg via INTRAVENOUS
  Administered 2020-07-09: 50 mg via INTRAVENOUS

## 2020-07-09 MED ORDER — LORAZEPAM 2 MG/ML IJ SOLN
INTRAMUSCULAR | Status: AC | PRN
  Administered 2020-07-09: 1 mg via INTRAVENOUS
  Administered 2020-07-09: 2 mg via INTRAVENOUS
  Administered 2020-07-09: 1 mg via INTRAVENOUS

## 2020-07-09 NOTE — ED Triage Notes (Signed)
Partial eft shoulder dislocation 3 days ago. She fell off her horse. She has a hx of seizures and  last night had a seizure causing her shoulder to completely dislocate.

## 2020-07-09 NOTE — ED Notes (Signed)
Pt. Complaining of occular issues .Marland Kitchen EDP long discussion of poss. Need to go for neuro if need continue for the IV Ativan use.
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
## 2020-07-09 NOTE — Sedation Documentation (Signed)
Pt having seizure; noted drooling, eyes rolled to left.  Pt having some jerking movements.  Airway held open by S. Cothren RT.  Dr. Rex Kras at bedside; holding left shoulder.  IV fluids increased to 200 ml/hr.

## 2020-07-09 NOTE — ED Notes (Addendum)
Called to room by Pt. Mother Pt. Seizing again.  Dr Johnney Killian EDP at bedside of Pt. Speaking with Pt. And Pt. Mother.  Pt. Now speaking and alert enough to speak about when her meds are due.

## 2020-07-09 NOTE — Sedation Documentation (Signed)
Pt. Started to have seizure with staff at bedside.  EDP was notified and Charge RN was getting med for Pt. At time Pt. Began to have seizure.

## 2020-07-09 NOTE — Discharge Instructions (Addendum)
1.  Follow-up with your orthopedic physician within the next 3 to 5 days.  Wear your sling for comfort.  Do not reach overhead or above the level of your shoulder with your arm. 2.  You have had some recurrent seizures in the emergency department but they have now subsided.  You continue to have seizures that are atypical for you or new unusual symptoms, call your neurologist.  Return to the emergency department if these cannot be managed at home by usual measures.

## 2020-07-09 NOTE — Sedation Documentation (Signed)
Left shoulder reduced by Dr. Rex Kras.  Pt tolerated very well.  No acute distress noted.

## 2020-07-09 NOTE — ED Notes (Signed)
While giving med Pt. Had jerking sensation and mother at bedside reports the Pt. Is having a seizure.  Pt. Woke immediately and said " I feel weird".  RN explained to Pt. That she just received Ativan for seizure.

## 2020-07-09 NOTE — ED Provider Notes (Signed)
Patient has successfully had her shoulder dislocation reduced by Dr. Rex Kras.  She however has history of seizure disorder and has had several recurrent seizures consisting of ocular motions and prodromal feeling.  Patient will continue to be observed for resolution of symptoms. Physical Exam  BP 118/79   Pulse 65   Temp 98.2 F (36.8 C) (Oral)   Resp 19   Ht 5\' 3"  (1.6 m)   Wt 47.6 kg   SpO2 100%   BMI 18.60 kg/m   Physical Exam Well-nourished well-developed.  No respiratory distress.  Patient has arm sling in place on the left.  Comfortable in appearance. ED Course/Procedures     Procedures CRITICAL CARE Performed by: Charlesetta Shanks   Total critical care time: 20 minutes  Critical care time was exclusive of separately billable procedures and treating other patients.  Critical care was necessary to treat or prevent imminent or life-threatening deterioration.  Critical care was time spent personally by me on the following activities: development of treatment plan with patient and/or surrogate as well as nursing, discussions with consultants, evaluation of patient's response to treatment, examination of patient, obtaining history from patient or surrogate, ordering and performing treatments and interventions, ordering and review of laboratory studies, ordering and review of radiographic studies, pulse oximetry and re-evaluation of patient's condition. MDM  Patient had  Ativan administered 2 mg doses x2 for recurrent seizure activity after awaking from her procedural sedation.  I did evaluate the patient when she was having a reported seizure.  Patient was not showing distress.  Vital signs were normal.  She had gaze deviation and fluttering of the eyes.  She made some murmuring sounds but no airway obstruction, drooling or oral grimacing.  No tonic-clonic activity.  Patient was given an additional 1 mg of Ativan to total 5 mg.  Her seizure activity abated and she was able to answer  questions in a soft but clear voice.  Patient's mother advised that she does sometimes exhibit these types of seizures after a procedure or anesthesia event.  This is not atypical.  Patient's mother advised that sometimes she does improve without additional medication.  We agreed on a plan to observe without administering additional Ativan unless symptoms persisted or worsened.  She had another episode of eye fluttering and deviation not treated with additional Ativan.  Then another 35 to 45 minutes of observation at which point the patient felt improved and is alert with baseline mental status.  At this time they both feel safe for discharge home.       Charlesetta Shanks, MD 07/09/20 [phone]

## 2020-07-09 NOTE — Sedation Documentation (Signed)
Pt. Earlier txted her mother letting her know to come and be with her and to bring her therapy dog as well.

## 2020-07-09 NOTE — Sedation Documentation (Signed)
Pt feels like she is still having seizure aura.  Noted a few twitches.  Orders given for more ativan per Dr. Rex Kras

## 2020-07-09 NOTE — ED Notes (Signed)
Pt. Reports she is feeling like she is going to have another seizure.

## 2020-07-09 NOTE — ED Notes (Signed)
Pt. Has no complaints on her shoulder the L shoulder with being in place.

## 2020-07-09 NOTE — Sedation Documentation (Signed)
Pt states she feels like she does before a seizure.  She doesn't feel well.
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
## 2020-07-12 ENCOUNTER — Encounter (HOSPITAL_COMMUNITY): Payer: BC Managed Care – PPO

## 2020-07-24 ENCOUNTER — Encounter: Payer: Self-pay | Admitting: Adult Health

## 2020-07-24 ENCOUNTER — Other Ambulatory Visit: Payer: Self-pay

## 2020-07-24 ENCOUNTER — Ambulatory Visit (INDEPENDENT_AMBULATORY_CARE_PROVIDER_SITE_OTHER): Payer: BC Managed Care – PPO | Admitting: Adult Health

## 2020-07-24 VITALS — BP 104/70 | Temp 98.4°F | Wt 105.0 lb

## 2020-07-24 DIAGNOSIS — E162 Hypoglycemia, unspecified: Secondary | ICD-10-CM

## 2020-07-24 MED ORDER — ONETOUCH ULTRASOFT LANCETS MISC: 12 refills | Status: DC

## 2020-07-24 MED ORDER — ONETOUCH ULTRA MINI W/DEVICE KIT: PACK | 0 refills | Status: DC

## 2020-07-24 MED ORDER — GLUCOSE BLOOD VI STRP: ORAL_STRIP | 12 refills | Status: DC
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
## 2020-08-13 DIAGNOSIS — L700 Acne vulgaris: Secondary | ICD-10-CM | POA: Diagnosis not present

## 2020-08-17 ENCOUNTER — Other Ambulatory Visit: Payer: Self-pay

## 2020-08-17 ENCOUNTER — Encounter: Payer: Self-pay | Admitting: Neurology

## 2020-08-17 ENCOUNTER — Ambulatory Visit: Payer: BC Managed Care – PPO | Admitting: Neurology

## 2020-08-17 VITALS — BP 117/77 | HR 62 | Ht 63.0 in | Wt 105.0 lb

## 2020-08-17 DIAGNOSIS — G43009 Migraine without aura, not intractable, without status migrainosus: Secondary | ICD-10-CM

## 2020-08-17 DIAGNOSIS — G40019 Localization-related (focal) (partial) idiopathic epilepsy and epileptic syndromes with seizures of localized onset, intractable, without status epilepticus: Secondary | ICD-10-CM

## 2020-08-17 MED ORDER — VIMPAT 100 MG PO TABS: 1.0000 | ORAL_TABLET | Freq: Two times a day (BID) | ORAL | 5 refills | Status: DC

## 2020-08-17 MED ORDER — FYCOMPA 4 MG PO TABS: 2.0000 | ORAL_TABLET | Freq: Every evening | ORAL | 5 refills | Status: DC

## 2020-08-17 MED ORDER — CLOBAZAM 10 MG PO TABS: ORAL_TABLET | ORAL | 5 refills | Status: DC

## 2020-08-17 MED ORDER — LEVETIRACETAM 500 MG PO TABS: ORAL_TABLET | ORAL | 11 refills | Status: DC

## 2020-08-17 MED ORDER — LORAZEPAM 0.5 MG PO TABS: ORAL_TABLET | ORAL | 5 refills | Status: DC
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
## 2020-08-17 NOTE — Patient Instructions (Signed)
Good to see you! Continue all your current medications. Call for an earlier appointment if needed. Follow-up in 6 months.  Seizure Precautions: 1. If medication has been prescribed for you to prevent seizures, take it exactly as directed.  Do not stop taking the medicine without talking to your doctor first, even if you have not had a seizure in a long time.   2. Avoid activities in which a seizure would cause danger to yourself or to others.  Don't operate dangerous machinery, swim alone, or climb in high or dangerous places, such as on ladders, roofs, or girders.  Do not drive unless your doctor says you may.  3. If you have any warning that you may have a seizure, lay down in a safe place where you can't hurt yourself.    4.  No driving for 6 months from last seizure, as per [facility].   Please refer to the following link on the [place] website for more information: [email or web address]   5.  Maintain good sleep hygiene. Avoid alcohol.  6.  Notify your neurology if you are planning pregnancy or if you become pregnant.  7.  Contact your doctor if you have any problems that may be related to the medicine you are taking.  8.  Call 911 and bring the patient back to the ED if:        A.  The seizure lasts longer than 5 minutes.       B.  The patient doesn't awaken shortly after the seizure  C.  The patient has new problems such as difficulty seeing, speaking or moving  D.  The patient was injured during the seizure  E.  The patient has a temperature over 102 F (39C)  F.  The patient vomited and now is having trouble breathing

## 2020-08-28 ENCOUNTER — Other Ambulatory Visit: Payer: Self-pay

## 2020-08-28 ENCOUNTER — Ambulatory Visit (HOSPITAL_COMMUNITY)
Admission: RE | Admit: 2020-08-28 | Discharge: 2020-08-28 | Disposition: A | Discharge status: Home / Self Care | Payer: BC Managed Care – PPO | Source: Ambulatory Visit | Attending: Internal Medicine | Admitting: Internal Medicine

## 2020-08-28 DIAGNOSIS — Z452 Encounter for adjustment and management of vascular access device: Secondary | ICD-10-CM | POA: Insufficient documentation

## 2020-08-28 MED ORDER — HEPARIN SOD (PORK) LOCK FLUSH 100 UNIT/ML IV SOLN
500.0000 [IU] | INTRAVENOUS | Status: AC | PRN
  Administered 2020-08-28: 500 [IU]

## 2020-08-28 MED ORDER — SODIUM CHLORIDE 0.9% FLUSH
10.0000 mL | INTRAVENOUS | Status: AC | PRN
  Administered 2020-08-28: 10 mL

## 2020-08-28 NOTE — Discharge Instructions (Signed)
Port-a-cath flushed with Heparin and Saline °

## 2020-08-28 NOTE — Progress Notes (Signed)
PATIENT CARE CENTER NOTE   Provider: Dr. Alphonsa Overall   Procedure: Port-a-cath flush   Note: Patient's PAC accessed and flushed with 0.9% Sodium Chloride. Sterile procedure followed. Patient tolerated procedure well. Discharge information given. Patient alert, oriented and ambulatory at discharge.

## 2020-09-04 ENCOUNTER — Encounter: Payer: Self-pay | Admitting: Adult Health

## 2020-09-06 ENCOUNTER — Other Ambulatory Visit: Payer: Self-pay

## 2020-09-06 ENCOUNTER — Ambulatory Visit (HOSPITAL_COMMUNITY)
Admission: RE | Admit: 2020-09-06 | Discharge: 2020-09-06 | Disposition: A | Discharge status: Home / Self Care | Payer: BC Managed Care – PPO | Source: Ambulatory Visit | Attending: Internal Medicine | Admitting: Internal Medicine

## 2020-09-06 DIAGNOSIS — Z452 Encounter for adjustment and management of vascular access device: Secondary | ICD-10-CM | POA: Diagnosis not present

## 2020-09-06 MED ORDER — HEPARIN SOD (PORK) LOCK FLUSH 100 UNIT/ML IV SOLN
500.0000 [IU] | INTRAVENOUS | Status: AC | PRN
  Administered 2020-09-06: 500 [IU]

## 2020-09-06 MED ORDER — SODIUM CHLORIDE 0.9% FLUSH
10.0000 mL | INTRAVENOUS | Status: AC | PRN
  Administered 2020-09-06: 10 mL

## 2020-09-06 NOTE — Progress Notes (Addendum)
PATIENT CARE CENTER NOTE   Provider:Dr. Alphonsa Overall   Procedure:Port-a-cath flush   Note:Patient returned for repeat PAC flush per MD instruction. MD concerned about inflammation and redness over port site. Patient's PAC accessed and flushed with Heparin and 0.9% Sodium Chloride. Sterile procedure followed. Blood return noted. Patient tolerated procedure well. Discharge information given. Patient alert, oriented and ambulatory at discharge.

## 2020-09-07 ENCOUNTER — Ambulatory Visit: Payer: BC Managed Care – PPO | Admitting: Adult Health

## 2020-09-07 ENCOUNTER — Encounter: Payer: Self-pay | Admitting: Adult Health

## 2020-09-07 VITALS — BP 110/70 | HR 63 | Ht 63.0 in | Wt 104.0 lb

## 2020-09-07 DIAGNOSIS — G40309 Generalized idiopathic epilepsy and epileptic syndromes, not intractable, without status epilepticus: Secondary | ICD-10-CM | POA: Diagnosis not present

## 2020-09-07 DIAGNOSIS — E8842 MERRF syndrome: Secondary | ICD-10-CM | POA: Diagnosis not present

## 2020-09-07 DIAGNOSIS — R21 Rash and other nonspecific skin eruption: Secondary | ICD-10-CM

## 2020-09-07 DIAGNOSIS — Z23 Encounter for immunization: Secondary | ICD-10-CM | POA: Diagnosis not present

## 2020-09-07 MED ORDER — FREESTYLE LIBRE 2 SENSOR MISC: 2 refills | Status: DC

## 2020-09-07 MED ORDER — DOXYCYCLINE HYCLATE 100 MG PO CAPS: 100.0000 mg | ORAL_CAPSULE | Freq: Two times a day (BID) | ORAL | 0 refills | Status: DC
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
## 2020-09-10 ENCOUNTER — Encounter: Payer: Self-pay | Admitting: Adult Health

## 2020-09-11 ENCOUNTER — Ambulatory Visit: Payer: BC Managed Care – PPO | Admitting: Adult Health

## 2020-09-21 ENCOUNTER — Telehealth: Payer: Self-pay | Admitting: Adult Health

## 2020-09-21 ENCOUNTER — Encounter: Payer: Self-pay | Admitting: Adult Health

## 2020-09-21 NOTE — Telephone Encounter (Signed)
Pt is needing a monitor to go along with her Elenor Legato 2 so the it will be able to talk to her Apple phone.  [facility] [address], [place], [place]

## 2020-09-24 MED ORDER — FREESTYLE LIBRE 2 READER DEVI: 0 refills | Status: DC

## 2020-09-24 NOTE — Telephone Encounter (Signed)
Rx sent in

## 2020-10-07 ENCOUNTER — Encounter: Payer: Self-pay | Admitting: Adult Health

## 2020-10-09 ENCOUNTER — Other Ambulatory Visit: Payer: Self-pay | Admitting: Adult Health

## 2020-10-09 MED ORDER — GLUCAGON EMERGENCY 1 MG IJ KIT: PACK | INTRAMUSCULAR | 3 refills | Status: DC

## 2020-10-16 ENCOUNTER — Ambulatory Visit: Payer: BC Managed Care – PPO | Admitting: Adult Health

## 2020-11-01 ENCOUNTER — Encounter (HOSPITAL_COMMUNITY): Payer: BC Managed Care – PPO

## 2020-11-12 ENCOUNTER — Other Ambulatory Visit: Payer: Self-pay

## 2020-11-12 ENCOUNTER — Ambulatory Visit (HOSPITAL_COMMUNITY)
Admission: RE | Admit: 2020-11-12 | Discharge: 2020-11-12 | Disposition: A | Discharge status: Home / Self Care | Payer: BC Managed Care – PPO | Source: Ambulatory Visit | Attending: Internal Medicine | Admitting: Internal Medicine

## 2020-11-12 DIAGNOSIS — Z452 Encounter for adjustment and management of vascular access device: Secondary | ICD-10-CM | POA: Insufficient documentation

## 2020-11-12 MED ORDER — HEPARIN SOD (PORK) LOCK FLUSH 100 UNIT/ML IV SOLN
500.0000 [IU] | INTRAVENOUS | Status: AC | PRN
  Administered 2020-11-12: 500 [IU]
  Filled 2020-11-12: qty 5

## 2020-11-12 MED ORDER — SODIUM CHLORIDE 0.9% FLUSH
10.0000 mL | INTRAVENOUS | Status: AC | PRN
  Administered 2020-11-12: 10 mL

## 2020-11-12 NOTE — Progress Notes (Signed)
  Provider: Dr. Isabel Caprice     Procedure: Port-a-cath flush     Note: Patient's PAC accessed, flushed with 10 cc 0.9% sodium chloride and heparin. Blood return noted. PAC de-accessed and site covered with band aid. Patient tolerated well. Patient instructed to make appointment in 4 weeks for next port flush, verbalized understanding. Alert, oriented and ambulatory at discharge.

## 2020-11-25 DIAGNOSIS — M25571 Pain in right ankle and joints of right foot: Secondary | ICD-10-CM | POA: Diagnosis not present

## 2020-11-27 ENCOUNTER — Ambulatory Visit: Payer: BC Managed Care – PPO | Admitting: Adult Health

## 2020-11-27 ENCOUNTER — Encounter: Payer: Self-pay | Admitting: Adult Health

## 2020-11-27 ENCOUNTER — Other Ambulatory Visit: Payer: Self-pay

## 2020-11-27 VITALS — BP 118/74 | HR 62 | Temp 98.7°F | Ht 63.0 in

## 2020-11-27 DIAGNOSIS — E8842 MERRF syndrome: Secondary | ICD-10-CM | POA: Diagnosis not present

## 2020-11-27 DIAGNOSIS — E162 Hypoglycemia, unspecified: Secondary | ICD-10-CM

## 2020-11-27 DIAGNOSIS — G40309 Generalized idiopathic epilepsy and epileptic syndromes, not intractable, without status epilepticus: Secondary | ICD-10-CM

## 2020-11-27 MED ORDER — DEXCOM G6 SENSOR MISC: 3 refills | Status: DC
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
## 2020-11-29 ENCOUNTER — Telehealth: Payer: Self-pay | Admitting: Adult Health

## 2020-11-29 ENCOUNTER — Telehealth: Payer: Self-pay | Admitting: Neurology

## 2020-11-29 ENCOUNTER — Encounter: Payer: Self-pay | Admitting: Adult Health

## 2020-11-29 DIAGNOSIS — N83209 Unspecified ovarian cyst, unspecified side: Secondary | ICD-10-CM | POA: Diagnosis not present

## 2020-11-29 DIAGNOSIS — G40909 Epilepsy, unspecified, not intractable, without status epilepticus: Secondary | ICD-10-CM | POA: Diagnosis not present

## 2020-11-29 DIAGNOSIS — N9489 Other specified conditions associated with female genital organs and menstrual cycle: Secondary | ICD-10-CM | POA: Diagnosis not present

## 2020-11-29 NOTE — Telephone Encounter (Signed)
Patient called in needing some Coordination of care with reproductive issues and would like to see if she can see you sooner than her current 03/22/21 appointment?

## 2020-11-29 NOTE — Telephone Encounter (Signed)
Patient seen [name] on 12/14 and [facility] sent in Continuous Blood Gluc Sensor ([place]) MISC the pharmacy told the patient that the insurance company needs more information.

## 2020-11-30 NOTE — Telephone Encounter (Signed)
Prior auth sent to [email or web address]-Key: [id number].  Patient informed via Mychart message.

## 2020-12-03 NOTE — Telephone Encounter (Signed)
Patient is calling to check the status of the message left on 11-29-20

## 2020-12-03 NOTE — Telephone Encounter (Signed)
Can you see any sooner, looks like heather sent you a message, please advise.

## 2020-12-03 NOTE — Telephone Encounter (Signed)
Patient advised.

## 2020-12-03 NOTE — Telephone Encounter (Signed)
Pls let her know she is on the waitlist, I don't have any openings currently but front will call her when one arises. Thanks

## 2020-12-04 NOTE — Telephone Encounter (Signed)
Patient mom called back and stated that patient also needs the transmitter and the reader also to go with the sensor, please advise. CB is [phone].

## 2020-12-06 ENCOUNTER — Telehealth: Payer: Self-pay | Admitting: Adult Health

## 2020-12-06 ENCOUNTER — Other Ambulatory Visit: Payer: Self-pay | Admitting: Adult Health

## 2020-12-06 DIAGNOSIS — E8842 MERRF syndrome: Secondary | ICD-10-CM

## 2020-12-06 DIAGNOSIS — G40309 Generalized idiopathic epilepsy and epileptic syndromes, not intractable, without status epilepticus: Secondary | ICD-10-CM

## 2020-12-06 MED ORDER — FREESTYLE LIBRE 2 SENSOR MISC: 2 refills | Status: DC

## 2020-12-06 MED ORDER — DEXCOM G6 RECEIVER DEVI: 0 refills | Status: DC

## 2020-12-06 MED ORDER — DEXCOM G6 TRANSMITTER MISC: 0 refills | Status: DC

## 2020-12-06 NOTE — Telephone Encounter (Signed)
Documentation sent from [facility] that the Dexacom G6 is denied and is only covered when a member receives insulin to manage their diabetes. Form placed in [name]'s file for review.

## 2020-12-06 NOTE — Telephone Encounter (Signed)
Pt mother is calling in stating that the pt needs a transmitter and reader for the Dexcom device that was sent in and need additional information for the Dexcom device and mother stated that the pharmacy has sent in a form for this.   [facility] [address]

## 2020-12-12 ENCOUNTER — Other Ambulatory Visit: Payer: Self-pay | Admitting: Adult Health

## 2020-12-12 DIAGNOSIS — E162 Hypoglycemia, unspecified: Secondary | ICD-10-CM

## 2020-12-12 DIAGNOSIS — E8842 MERRF syndrome: Secondary | ICD-10-CM

## 2020-12-12 MED ORDER — DEXCOM G6 SENSOR MISC: 12 refills | Status: DC

## 2020-12-12 MED ORDER — DEXCOM G6 TRANSMITTER MISC: 4 refills | Status: DC

## 2020-12-17 ENCOUNTER — Telehealth: Payer: Self-pay | Admitting: Adult Health

## 2020-12-17 NOTE — Telephone Encounter (Signed)
The pharmacy need  verification why the patient needs the    Continuous Blood Gluc Sensor (DEXCOM G6 SENSOR) MISC  Continuous Blood Gluc Transmit (DEXCOM G6 TRANSMITTER) MISC  [id number] [place], [place] - [address]  Phone:  [phone] Fax:  [phone]

## 2020-12-18 DIAGNOSIS — F509 Eating disorder, unspecified: Secondary | ICD-10-CM | POA: Diagnosis not present

## 2020-12-18 DIAGNOSIS — F840 Autistic disorder: Secondary | ICD-10-CM | POA: Diagnosis not present

## 2020-12-18 DIAGNOSIS — F4312 Post-traumatic stress disorder, chronic: Secondary | ICD-10-CM | POA: Diagnosis not present

## 2020-12-19 NOTE — Telephone Encounter (Signed)
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

## 2020-12-19 NOTE — Telephone Encounter (Signed)
Unsure of what diagnosis to give.  Will forward to [facility] for more information.

## 2020-12-19 NOTE — Telephone Encounter (Signed)
Spoke to the pharmacy.  A prior authorization is needed for the [facility] system.

## 2020-12-20 NOTE — Telephone Encounter (Signed)
Spoke to [employer]	[place] at [employer].  I was not able to do a prior authorization on the phone but Poppy sent over a fax.  I have completed the form.  Kandee Keen signed it and I have faxed it in.  Received confirmation that the fax was successful.  Will not wait for notification for [employer].

## 2020-12-21 DIAGNOSIS — F509 Eating disorder, unspecified: Secondary | ICD-10-CM | POA: Diagnosis not present

## 2020-12-21 DIAGNOSIS — F4312 Post-traumatic stress disorder, chronic: Secondary | ICD-10-CM | POA: Diagnosis not present

## 2020-12-21 DIAGNOSIS — F840 Autistic disorder: Secondary | ICD-10-CM | POA: Diagnosis not present

## 2020-12-25 ENCOUNTER — Other Ambulatory Visit: Payer: Self-pay | Admitting: Adult Health

## 2020-12-25 DIAGNOSIS — F840 Autistic disorder: Secondary | ICD-10-CM | POA: Diagnosis not present

## 2020-12-25 DIAGNOSIS — E162 Hypoglycemia, unspecified: Secondary | ICD-10-CM

## 2020-12-25 DIAGNOSIS — F509 Eating disorder, unspecified: Secondary | ICD-10-CM | POA: Diagnosis not present

## 2020-12-25 DIAGNOSIS — F4312 Post-traumatic stress disorder, chronic: Secondary | ICD-10-CM | POA: Diagnosis not present

## 2020-12-25 NOTE — Telephone Encounter (Signed)
Spoke to [name] and informed her that the authorization was declined as she is not using insulin.  In order for the Dexcom 6 to be approved the patient MUST use insulin.  [name] has decided to pay out of pocked at this time.  Nothing further needed.

## 2020-12-26 DIAGNOSIS — F840 Autistic disorder: Secondary | ICD-10-CM | POA: Diagnosis not present

## 2020-12-27 ENCOUNTER — Encounter: Payer: Self-pay | Admitting: Adult Health

## 2020-12-28 NOTE — Telephone Encounter (Signed)
Pt is calling to check the status of a form that she stated that [facility] had faxed over to [place] on yesterday she is aware that we have not received it (I asked Misty as well as looked up front).

## 2021-01-01 DIAGNOSIS — F840 Autistic disorder: Secondary | ICD-10-CM | POA: Diagnosis not present

## 2021-01-01 DIAGNOSIS — F4312 Post-traumatic stress disorder, chronic: Secondary | ICD-10-CM | POA: Diagnosis not present

## 2021-01-02 ENCOUNTER — Encounter (HOSPITAL_COMMUNITY): Payer: BC Managed Care – PPO

## 2021-01-03 DIAGNOSIS — F840 Autistic disorder: Secondary | ICD-10-CM | POA: Diagnosis not present

## 2021-01-08 DIAGNOSIS — F4312 Post-traumatic stress disorder, chronic: Secondary | ICD-10-CM | POA: Diagnosis not present

## 2021-01-08 DIAGNOSIS — F840 Autistic disorder: Secondary | ICD-10-CM | POA: Diagnosis not present

## 2021-01-09 ENCOUNTER — Other Ambulatory Visit: Payer: Self-pay

## 2021-01-09 ENCOUNTER — Ambulatory Visit (HOSPITAL_COMMUNITY)
Admission: RE | Admit: 2021-01-09 | Discharge: 2021-01-09 | Disposition: A | Discharge status: Home / Self Care | Payer: BC Managed Care – PPO | Source: Ambulatory Visit | Attending: Internal Medicine | Admitting: Internal Medicine

## 2021-01-09 DIAGNOSIS — F840 Autistic disorder: Secondary | ICD-10-CM | POA: Diagnosis not present

## 2021-01-09 DIAGNOSIS — Z452 Encounter for adjustment and management of vascular access device: Secondary | ICD-10-CM | POA: Insufficient documentation

## 2021-01-09 MED ORDER — SODIUM CHLORIDE 0.9% FLUSH
10.0000 mL | INTRAVENOUS | Status: AC | PRN
  Administered 2021-01-09: 10 mL

## 2021-01-09 MED ORDER — HEPARIN SOD (PORK) LOCK FLUSH 100 UNIT/ML IV SOLN
500.0000 [IU] | INTRAVENOUS | Status: AC | PRN
  Administered 2021-01-09: 500 [IU]

## 2021-01-09 NOTE — Progress Notes (Signed)
PATIENT CARE CENTER NOTE   Provider:Dr.Cory Media planner   Procedure:Port-a-cath flush   Note:Patient's PAC accessed and flushed with 0.9% Sodium Chloride and Heparin. Sterile procedure followed. Patient tolerated procedure well. AVS offered but patient refused. Patient alert, oriented and ambulatory at discharge.

## 2021-01-10 DIAGNOSIS — F840 Autistic disorder: Secondary | ICD-10-CM | POA: Diagnosis not present

## 2021-01-15 DIAGNOSIS — F840 Autistic disorder: Secondary | ICD-10-CM | POA: Diagnosis not present

## 2021-01-15 DIAGNOSIS — F4312 Post-traumatic stress disorder, chronic: Secondary | ICD-10-CM | POA: Diagnosis not present

## 2021-01-16 DIAGNOSIS — F840 Autistic disorder: Secondary | ICD-10-CM | POA: Diagnosis not present

## 2021-01-22 DIAGNOSIS — F840 Autistic disorder: Secondary | ICD-10-CM | POA: Diagnosis not present

## 2021-01-22 DIAGNOSIS — F4312 Post-traumatic stress disorder, chronic: Secondary | ICD-10-CM | POA: Diagnosis not present

## 2021-01-24 DIAGNOSIS — F840 Autistic disorder: Secondary | ICD-10-CM | POA: Diagnosis not present

## 2021-01-29 DIAGNOSIS — F4312 Post-traumatic stress disorder, chronic: Secondary | ICD-10-CM | POA: Diagnosis not present

## 2021-01-29 DIAGNOSIS — F840 Autistic disorder: Secondary | ICD-10-CM | POA: Diagnosis not present

## 2021-01-30 DIAGNOSIS — E162 Hypoglycemia, unspecified: Secondary | ICD-10-CM | POA: Diagnosis not present

## 2021-01-31 DIAGNOSIS — F840 Autistic disorder: Secondary | ICD-10-CM | POA: Diagnosis not present

## 2021-02-01 DIAGNOSIS — F840 Autistic disorder: Secondary | ICD-10-CM | POA: Diagnosis not present

## 2021-02-01 DIAGNOSIS — N912 Amenorrhea, unspecified: Secondary | ICD-10-CM | POA: Diagnosis not present

## 2021-02-01 DIAGNOSIS — F4312 Post-traumatic stress disorder, chronic: Secondary | ICD-10-CM | POA: Diagnosis not present

## 2021-02-05 ENCOUNTER — Encounter: Payer: Self-pay | Admitting: Adult Health

## 2021-02-05 DIAGNOSIS — F4312 Post-traumatic stress disorder, chronic: Secondary | ICD-10-CM | POA: Diagnosis not present

## 2021-02-05 DIAGNOSIS — F840 Autistic disorder: Secondary | ICD-10-CM | POA: Diagnosis not present

## 2021-02-07 ENCOUNTER — Other Ambulatory Visit: Payer: Self-pay

## 2021-02-07 DIAGNOSIS — F840 Autistic disorder: Secondary | ICD-10-CM | POA: Diagnosis not present

## 2021-02-08 ENCOUNTER — Ambulatory Visit (INDEPENDENT_AMBULATORY_CARE_PROVIDER_SITE_OTHER): Payer: BC Managed Care – PPO | Admitting: Adult Health

## 2021-02-08 ENCOUNTER — Encounter: Payer: Self-pay | Admitting: Adult Health

## 2021-02-08 VITALS — BP 110/74 | Temp 98.1°F | Wt 105.0 lb

## 2021-02-08 DIAGNOSIS — Z01818 Encounter for other preprocedural examination: Secondary | ICD-10-CM | POA: Diagnosis not present
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
## 2021-02-09 LAB — CBC WITH DIFFERENTIAL/PLATELET
Absolute Monocytes: 554 cells/uL (ref 200–950)
Basophils Absolute: 38 cells/uL (ref 0–200)
Basophils Relative: 0.6 %
Eosinophils Absolute: 284 cells/uL (ref 15–500)
Eosinophils Relative: 4.5 %
HCT: 38.8 % (ref 35.0–45.0)
Hemoglobin: 13.4 g/dL (ref 11.7–15.5)
Lymphs Abs: 2098 cells/uL (ref 850–3900)
MCH: 30.5 pg (ref 27.0–33.0)
MCHC: 34.5 g/dL (ref 32.0–36.0)
MCV: 88.4 fL (ref 80.0–100.0)
MPV: 10.9 fL (ref 7.5–12.5)
Monocytes Relative: 8.8 %
Neutro Abs: 3326 cells/uL (ref 1500–7800)
Neutrophils Relative %: 52.8 %
Platelets: 347 10*3/uL (ref 140–400)
RBC: 4.39 10*6/uL (ref 3.80–5.10)
RDW: 12.3 % (ref 11.0–15.0)
Total Lymphocyte: 33.3 %
WBC: 6.3 10*3/uL (ref 3.8–10.8)

## 2021-02-09 LAB — COMPREHENSIVE METABOLIC PANEL
AG Ratio: 2.1 (calc) (ref 1.0–2.5)
ALT: 5 U/L — ABNORMAL LOW (ref 6–29)
AST: 13 U/L (ref 10–30)
Albumin: 4.7 g/dL (ref 3.6–5.1)
Alkaline phosphatase (APISO): 40 U/L (ref 31–125)
BUN: 10 mg/dL (ref 7–25)
CO2: 26 mmol/L (ref 20–32)
Calcium: 9.2 mg/dL (ref 8.6–10.2)
Chloride: 105 mmol/L (ref 98–110)
Creat: 0.6 mg/dL (ref 0.50–1.10)
Globulin: 2.2 g/dL (calc) (ref 1.9–3.7)
Glucose, Bld: 77 mg/dL (ref 65–99)
Potassium: 3.9 mmol/L (ref 3.5–5.3)
Sodium: 140 mmol/L (ref 135–146)
Total Bilirubin: 0.5 mg/dL (ref 0.2–1.2)
Total Protein: 6.9 g/dL (ref 6.1–8.1)

## 2021-02-09 LAB — PROTIME-INR
INR: 1
Prothrombin Time: 10.5 s (ref 9.0–11.5)

## 2021-02-12 DIAGNOSIS — F840 Autistic disorder: Secondary | ICD-10-CM | POA: Diagnosis not present

## 2021-02-12 DIAGNOSIS — F4312 Post-traumatic stress disorder, chronic: Secondary | ICD-10-CM | POA: Diagnosis not present

## 2021-02-13 DIAGNOSIS — F840 Autistic disorder: Secondary | ICD-10-CM | POA: Diagnosis not present

## 2021-02-15 ENCOUNTER — Ambulatory Visit (HOSPITAL_BASED_OUTPATIENT_CLINIC_OR_DEPARTMENT_OTHER): Admit: 2021-02-15 | Payer: BC Managed Care – PPO | Admitting: Obstetrics and Gynecology

## 2021-02-15 ENCOUNTER — Encounter (HOSPITAL_BASED_OUTPATIENT_CLINIC_OR_DEPARTMENT_OTHER): Payer: Self-pay

## 2021-02-15 SURGERY — BIOPSY, VULVA
Anesthesia: General

## 2021-02-19 ENCOUNTER — Other Ambulatory Visit: Payer: Self-pay

## 2021-02-19 ENCOUNTER — Non-Acute Institutional Stay (HOSPITAL_COMMUNITY)
Admission: RE | Admit: 2021-02-19 | Discharge: 2021-02-19 | Disposition: A | Discharge status: Home / Self Care | Payer: BC Managed Care – PPO | Source: Ambulatory Visit | Attending: Internal Medicine | Admitting: Internal Medicine

## 2021-02-19 DIAGNOSIS — Z452 Encounter for adjustment and management of vascular access device: Secondary | ICD-10-CM | POA: Insufficient documentation

## 2021-02-19 DIAGNOSIS — F840 Autistic disorder: Secondary | ICD-10-CM | POA: Diagnosis not present

## 2021-02-19 DIAGNOSIS — F4312 Post-traumatic stress disorder, chronic: Secondary | ICD-10-CM | POA: Diagnosis not present

## 2021-02-19 MED ORDER — SODIUM CHLORIDE 0.9% FLUSH
10.0000 mL | INTRAVENOUS | Status: AC | PRN
  Administered 2021-02-19: 10 mL

## 2021-02-19 MED ORDER — HEPARIN SOD (PORK) LOCK FLUSH 100 UNIT/ML IV SOLN
500.0000 [IU] | INTRAVENOUS | Status: AC | PRN
  Administered 2021-02-19: 500 [IU]
  Filled 2021-02-19: qty 5

## 2021-02-19 NOTE — Progress Notes (Signed)
PATIENT CARE CENTER NOTE   Provider:Dr.Cory Nafziger   Procedure:Port-a-cath flush   Note:Patient's PAC accessed and flushed with 0.9% Sodium Chloride and Heparin. Sterile procedure followed. Patient tolerated procedure well. AVS offered but patient refused. Patient alert, oriented and ambulatory at discharge.

## 2021-02-21 DIAGNOSIS — F840 Autistic disorder: Secondary | ICD-10-CM | POA: Diagnosis not present

## 2021-02-26 DIAGNOSIS — F840 Autistic disorder: Secondary | ICD-10-CM | POA: Diagnosis not present

## 2021-02-26 DIAGNOSIS — F4312 Post-traumatic stress disorder, chronic: Secondary | ICD-10-CM | POA: Diagnosis not present

## 2021-02-28 DIAGNOSIS — F840 Autistic disorder: Secondary | ICD-10-CM | POA: Diagnosis not present

## 2021-03-04 ENCOUNTER — Encounter (HOSPITAL_BASED_OUTPATIENT_CLINIC_OR_DEPARTMENT_OTHER): Payer: Self-pay | Admitting: Obstetrics and Gynecology

## 2021-03-05 ENCOUNTER — Other Ambulatory Visit: Payer: Self-pay

## 2021-03-05 ENCOUNTER — Encounter (HOSPITAL_BASED_OUTPATIENT_CLINIC_OR_DEPARTMENT_OTHER): Payer: Self-pay | Admitting: Obstetrics and Gynecology

## 2021-03-05 DIAGNOSIS — F4312 Post-traumatic stress disorder, chronic: Secondary | ICD-10-CM | POA: Diagnosis not present

## 2021-03-05 DIAGNOSIS — F840 Autistic disorder: Secondary | ICD-10-CM | POA: Diagnosis not present

## 2021-03-05 NOTE — Progress Notes (Addendum)
Spoke w/ via phone for pre-op interview---pt Lab needs dos----   Urine preg            Lab results------see below COVID test ------03-08-2021 1315 for cbc bmp t & s  Arrive at -------600 am 03-12-2021 NPO after MN NO Solid Food.  Clear liquids from MN until---500 am then npo Med rec completed Medications to take morning of surgery -----levetiracetram prn, ativan prn, albuterol inhaler prn/bring inhaler with you Diabetic medication ----- Patient instructed to bring photo id and insurance card day of surgery Patient aware to have Driver (ride ) / caregiver mother catherine driver and caregiver, will wait outside, pt has permisson per beverly taavon for therapist christina rush to come to pre op day of surgery   for 24 hours after surgery  Patient Special Instructions -----overnight/extended stay instructions given to pt Pre-Op special Istructions -----none Patient verbalized understanding of instructions that were given at this phone interview. Patient denies shortness of breath, chest pain, fever, cough at this phone interview.   Chest xray 1 view 07-05-2020 epic lov neurlogy dr Delice Lesch  Echo 10-04-2018 epic ekg 02-08-2021 epic lov cory nafziger np 02-05-2021 epic Medical clearance note 02-08-2021 cory nafziger on chart for 2-29-2022 surgery  Pt has permission for therapist to be with patient in pre op dos per beverly taavon  Has freestyyle libra on right arm (hypoglycemia) lov endocrinology dr Janese Banks 01-30-2021 care everywhere/chart  Pt to make dr Terri Piedra aware wishes to use right chest pac day of surgery due to difficult iv access

## 2021-03-05 NOTE — Progress Notes (Signed)
YOU ARE SCHEDULED FOR A COVID TEST  03-08-2021 230 pm.  THIS TEST MUST BE DONE BEFORE SURGERY. GO TO  [place]. [place], [place], IT IS APPROXIMATELY 2 MINUTES PAST [employer] ON THE RIGHT AND REMAIN IN YOUR CAR, THIS IS A DRIVE UP TEST. ONCE YOUR COVID TEST IS DONE PLEASE FOLLOW ALL THE QUARANTINE  INSTRUCTIONS GIVEN IN YOUR HANDOUT.      Your procedure is scheduled on  03-12-2021  Report to [facility].   Call this number if you have problems the morning of surgery  [phone].   OUR ADDRESS IS [address].  WE ARE LOCATED IN [facility].  PLEASE BRING YOUR INSURANCE CARD AND PHOTO ID DAY OF SURGERY.  ONLY ONE PERSON ALLOWED IN FACILITY WAITING AREA.                                     REMEMBER:  DO NOT EAT FOOD, CANDY GUM OR MINTS  AFTER MIDNIGHT . YOU MAY HAVE CLEAR LIQUIDS FROM MIDNIGHT UNTIL  500 AM. NO CLEAR LIQUIDS AFTER 500 AM  DAY OF SURGERY.   YOU MAY  BRUSH YOUR TEETH MORNING OF SURGERY AND RINSE YOUR MOUTH OUT, NO CHEWING GUM CANDY OR MINTS.    CLEAR LIQUID DIET   Foods Allowed                                                                     Foods Excluded  Coffee and tea, regular and decaf                             liquids that you cannot  Plain Jell-O any favor except red or purple                                           see through such as: Fruit ices (not with fruit pulp)                                     milk, soups, orange juice  Iced Popsicles                                    All solid food Carbonated beverages, regular and diet                                    Cranberry, grape and apple juices Sports drinks like Gatorade Lightly seasoned clear broth or consume(fat free) Sugar, honey syrup  Sample Menu Breakfast                                Lunch  Supper Cranberry juice                    Beef broth                            Chicken  broth Jell-O                                     Grape juice                           Apple juice Coffee or tea                        Jell-O                                      Popsicle                                                Coffee or tea                        Coffee or tea  _____________________________________________________________________     TAKE THESE MEDICATIONS MORNING OF SURGERY WITH A SIP OF WATER:  LEVETIRECTRAM IF NEEDED, ATIVAN IF NEEDED, ALBUTEROL INHALER IF NEEDED AND BRING INHALER WITH YOU.  ONE VISITOR IS ALLOWED IN WAITING ROOM ONLY DAY OF SURGERY.  NO VISITOR MAY SPEND THE NIGHT.  VISITOR ARE ALLOWED TO STAY UNTIL 800 PM.                                    DO NOT WEAR JEWERLY, MAKE UP. DO NOT WEAR LOTIONS, POWDERS, PERFUMES OR DEODORANT. DO NOT SHAVE FOR 24 HOURS PRIOR TO DAY OF SURGERY. MEN MAY SHAVE FACE AND NECK. CONTACTS, GLASSES, OR DENTURES MAY NOT BE WORN TO SURGERY.                                    [facility] IS NOT RESPONSIBLE  FOR ANY BELONGINGS.                                                                    Marland Kitchen           [facility] - Preparing for Surgery Before surgery, you can play an important role.  Because skin is not sterile, your skin needs to be as free of germs as possible.  You can reduce the number of germs on your skin by washing with CHG (chlorahexidine gluconate) soap before surgery.  CHG is an antiseptic cleaner which kills germs and bonds with the skin to continue killing germs even after washing. Please DO NOT use if you have  an allergy to CHG or antibacterial soaps.  If your skin becomes reddened/irritated stop using the CHG and inform your nurse when you arrive at Short Stay. Do not shave (including legs and underarms) for at least 48 hours prior to the first CHG shower.  You may shave your face/neck. Please follow these instructions carefully:  1.  Shower with CHG Soap the night before surgery and the  morning of  Surgery.  2.  If you choose to wash your hair, wash your hair first as usual with your  normal  shampoo.  3.  After you shampoo, rinse your hair and body thoroughly to remove the  shampoo.                            4.  Use CHG as you would any other liquid soap.  You can apply chg directly  to the skin and wash                      Gently with a scrungie or clean washcloth.  5.  Apply the CHG Soap to your body ONLY FROM THE NECK DOWN.   Do not use on face/ open                           Wound or open sores. Avoid contact with eyes, ears mouth and genitals (private parts).                       Wash face,  Genitals (private parts) with your normal soap.             6.  Wash thoroughly, paying special attention to the area where your surgery  will be performed.  7.  Thoroughly rinse your body with warm water from the neck down.  8.  DO NOT shower/wash with your normal soap after using and rinsing off  the CHG Soap.                9.  Pat yourself dry with a clean towel.            10.  Wear clean pajamas.            11.  Place clean sheets on your bed the night of your first shower and do not  sleep with pets. Day of Surgery : Do not apply any lotions/deodorants the morning of surgery.  Please wear clean clothes to the hospital/surgery center.  FAILURE TO FOLLOW THESE INSTRUCTIONS MAY RESULT IN THE CANCELLATION OF YOUR SURGERY PATIENT SIGNATURE_________________________________  NURSE SIGNATURE__________________________________  ________________________________________________________________________                                                        QUESTIONS [name] PRE OP NURSE PHONE [phone]

## 2021-03-07 DIAGNOSIS — F840 Autistic disorder: Secondary | ICD-10-CM | POA: Diagnosis not present

## 2021-03-07 DIAGNOSIS — Z789 Other specified health status: Secondary | ICD-10-CM | POA: Diagnosis not present

## 2021-03-08 ENCOUNTER — Ambulatory Visit: Payer: BC Managed Care – PPO

## 2021-03-08 ENCOUNTER — Encounter (HOSPITAL_COMMUNITY)
Admission: RE | Admit: 2021-03-08 | Discharge: 2021-03-08 | Disposition: A | Discharge status: Home / Self Care | Payer: BC Managed Care – PPO | Source: Ambulatory Visit | Attending: Obstetrics and Gynecology | Admitting: Obstetrics and Gynecology

## 2021-03-08 ENCOUNTER — Encounter (HOSPITAL_BASED_OUTPATIENT_CLINIC_OR_DEPARTMENT_OTHER): Payer: Self-pay | Admitting: Obstetrics and Gynecology

## 2021-03-08 ENCOUNTER — Other Ambulatory Visit (HOSPITAL_COMMUNITY)
Admission: RE | Admit: 2021-03-08 | Discharge: 2021-03-08 | Disposition: A | Discharge status: Home / Self Care | Payer: BC Managed Care – PPO | Source: Ambulatory Visit | Attending: Obstetrics and Gynecology | Admitting: Obstetrics and Gynecology

## 2021-03-08 ENCOUNTER — Other Ambulatory Visit: Payer: Self-pay

## 2021-03-08 DIAGNOSIS — Z20822 Contact with and (suspected) exposure to covid-19: Secondary | ICD-10-CM | POA: Diagnosis not present

## 2021-03-08 DIAGNOSIS — F840 Autistic disorder: Secondary | ICD-10-CM | POA: Diagnosis not present

## 2021-03-08 DIAGNOSIS — Z01812 Encounter for preprocedural laboratory examination: Secondary | ICD-10-CM | POA: Insufficient documentation

## 2021-03-08 LAB — CBC
HCT: 38.8 % (ref 36.0–46.0)
Hemoglobin: 13.1 g/dL (ref 12.0–15.0)
MCH: 30.2 pg (ref 26.0–34.0)
MCHC: 33.8 g/dL (ref 30.0–36.0)
MCV: 89.4 fL (ref 80.0–100.0)
Platelets: 333 10*3/uL (ref 150–400)
RBC: 4.34 MIL/uL (ref 3.87–5.11)
RDW: 12.2 % (ref 11.5–15.5)
WBC: 7.3 10*3/uL (ref 4.0–10.5)
nRBC: 0 % (ref 0.0–0.2)

## 2021-03-08 LAB — BASIC METABOLIC PANEL
Anion gap: 9 (ref 5–15)
BUN: 10 mg/dL (ref 6–20)
CO2: 23 mmol/L (ref 22–32)
Calcium: 9.1 mg/dL (ref 8.9–10.3)
Chloride: 108 mmol/L (ref 98–111)
Creatinine, Ser: 0.61 mg/dL (ref 0.44–1.00)
GFR, Estimated: >60 mL/min (ref >60)
Glucose, Bld: 90 mg/dL (ref 70–99)
Potassium: 4 mmol/L (ref 3.5–5.1)
Sodium: 140 mmol/L (ref 135–145)

## 2021-03-08 LAB — SARS CORONAVIRUS 2 (TAT 6-24 HRS): SARS Coronavirus 2: NEGATIVE

## 2021-03-11 ENCOUNTER — Encounter (HOSPITAL_COMMUNITY): Payer: Self-pay | Admitting: Obstetrics and Gynecology

## 2021-03-11 DIAGNOSIS — F4312 Post-traumatic stress disorder, chronic: Secondary | ICD-10-CM | POA: Diagnosis not present

## 2021-03-11 DIAGNOSIS — F840 Autistic disorder: Secondary | ICD-10-CM | POA: Diagnosis not present

## 2021-03-11 NOTE — Progress Notes (Signed)
Pt's surgery was moved from [facility] to here at [facility] for tomorrow. Pt already had a pre-op call from a nurse at [facility]. I just verified some information and gave her pre-op instructions.   Pt has hx of seizures, she is hypoglycemic and wear a Freestyle Libre on her left arm. She also has a port a cath and has requested that we would use that during her surgery.   Spoke with Dr. Deatra Canter, Anesthesiologist and gave him above information.
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
## 2021-03-12 ENCOUNTER — Ambulatory Visit (HOSPITAL_COMMUNITY)
Admission: RE | Admit: 2021-03-12 | Discharge: 2021-03-13 | Disposition: A | Discharge status: Home / Self Care | Payer: BC Managed Care – PPO | Attending: Obstetrics and Gynecology | Admitting: Obstetrics and Gynecology

## 2021-03-12 ENCOUNTER — Other Ambulatory Visit: Payer: Self-pay

## 2021-03-12 ENCOUNTER — Ambulatory Visit (HOSPITAL_COMMUNITY): Payer: BC Managed Care – PPO | Admitting: Anesthesiology

## 2021-03-12 ENCOUNTER — Encounter (HOSPITAL_COMMUNITY): Payer: Self-pay | Admitting: Obstetrics and Gynecology

## 2021-03-12 ENCOUNTER — Encounter (HOSPITAL_COMMUNITY)
Admission: RE | Disposition: A | Discharge status: Home / Self Care | Payer: Self-pay | Source: Home / Self Care | Attending: Obstetrics and Gynecology

## 2021-03-12 DIAGNOSIS — N840 Polyp of corpus uteri: Secondary | ICD-10-CM | POA: Diagnosis not present

## 2021-03-12 DIAGNOSIS — N838 Other noninflammatory disorders of ovary, fallopian tube and broad ligament: Secondary | ICD-10-CM | POA: Insufficient documentation

## 2021-03-12 DIAGNOSIS — F4312 Post-traumatic stress disorder, chronic: Secondary | ICD-10-CM | POA: Diagnosis not present

## 2021-03-12 DIAGNOSIS — Z880 Allergy status to penicillin: Secondary | ICD-10-CM | POA: Diagnosis not present

## 2021-03-12 DIAGNOSIS — Z91040 Latex allergy status: Secondary | ICD-10-CM | POA: Insufficient documentation

## 2021-03-12 DIAGNOSIS — G40909 Epilepsy, unspecified, not intractable, without status epilepticus: Secondary | ICD-10-CM | POA: Diagnosis not present

## 2021-03-12 DIAGNOSIS — K219 Gastro-esophageal reflux disease without esophagitis: Secondary | ICD-10-CM | POA: Diagnosis not present

## 2021-03-12 DIAGNOSIS — F840 Autistic disorder: Secondary | ICD-10-CM | POA: Diagnosis not present

## 2021-03-12 DIAGNOSIS — N83209 Unspecified ovarian cyst, unspecified side: Secondary | ICD-10-CM | POA: Diagnosis not present

## 2021-03-12 DIAGNOSIS — J45909 Unspecified asthma, uncomplicated: Secondary | ICD-10-CM | POA: Diagnosis not present

## 2021-03-12 DIAGNOSIS — N946 Dysmenorrhea, unspecified: Secondary | ICD-10-CM | POA: Diagnosis present

## 2021-03-12 DIAGNOSIS — Z9889 Other specified postprocedural states: Secondary | ICD-10-CM

## 2021-03-12 DIAGNOSIS — J449 Chronic obstructive pulmonary disease, unspecified: Secondary | ICD-10-CM | POA: Diagnosis not present

## 2021-03-12 HISTORY — DX: Other specified health status: Z78.9

## 2021-03-12 HISTORY — DX: Acne, unspecified: L70.9

## 2021-03-12 HISTORY — PX: CYSTOSCOPY: SHX5120

## 2021-03-12 HISTORY — DX: Pain in unspecified joint: M25.50

## 2021-03-12 HISTORY — PX: TOTAL LAPAROSCOPIC HYSTERECTOMY WITH SALPINGECTOMY: SHX6742

## 2021-03-12 HISTORY — DX: Presence of spectacles and contact lenses: Z97.3

## 2021-03-12 LAB — GLUCOSE, CAPILLARY: Glucose-Capillary: 100 mg/dL — ABNORMAL HIGH (ref 70–99)

## 2021-03-12 LAB — POCT PREGNANCY, URINE: Preg Test, Ur: NEGATIVE

## 2021-03-12 LAB — TYPE AND SCREEN
ABO/RH(D): O POS
ABO/RH(D): O POS
Antibody Screen: NEGATIVE
Antibody Screen: NEGATIVE

## 2021-03-12 SURGERY — HYSTERECTOMY, TOTAL, LAPAROSCOPIC, WITH SALPINGECTOMY
Anesthesia: General

## 2021-03-12 MED ORDER — LORAZEPAM 0.5 MG PO TABS
0.5000 mg | ORAL_TABLET | ORAL | Status: DC | PRN
  Administered 2021-03-12: 0.5 mg via ORAL
  Filled 2021-03-12: qty 1

## 2021-03-12 MED ORDER — ROCURONIUM BROMIDE 10 MG/ML (PF) SYRINGE
PREFILLED_SYRINGE | INTRAVENOUS | Status: AC
  Filled 2021-03-12: qty 10

## 2021-03-12 MED ORDER — CLINDAMYCIN PHOSPHATE 900 MG/50ML IV SOLN
INTRAVENOUS | Status: AC
  Filled 2021-03-12: qty 50

## 2021-03-12 MED ORDER — FLUORESCEIN SODIUM 10 % IV SOLN
500.0000 mg | Freq: Once | INTRAVENOUS | Status: AC
  Administered 2021-03-12 (×2): 100 mg via INTRAVENOUS
  Filled 2021-03-12: qty 5

## 2021-03-12 MED ORDER — EPHEDRINE SULFATE-NACL 50-0.9 MG/10ML-% IV SOSY
PREFILLED_SYRINGE | INTRAVENOUS | Status: DC | PRN
  Administered 2021-03-12: 2.5 mg via INTRAVENOUS
  Administered 2021-03-12: 5 mg via INTRAVENOUS

## 2021-03-12 MED ORDER — FENTANYL CITRATE (PF) 250 MCG/5ML IJ SOLN
INTRAMUSCULAR | Status: DC | PRN
  Administered 2021-03-12: 100 ug via INTRAVENOUS
  Administered 2021-03-12 (×2): 50 ug via INTRAVENOUS

## 2021-03-12 MED ORDER — PROPOFOL 10 MG/ML IV BOLUS
INTRAVENOUS | Status: AC
  Filled 2021-03-12: qty 20

## 2021-03-12 MED ORDER — LACTATED RINGERS IV SOLN: INTRAVENOUS | Status: DC

## 2021-03-12 MED ORDER — SODIUM CHLORIDE 0.9 % IR SOLN
Status: DC | PRN
Start: 2021-03-12 — End: 2021-03-12
  Administered 2021-03-12: 3000 mL

## 2021-03-12 MED ORDER — KETOROLAC TROMETHAMINE 30 MG/ML IJ SOLN
30.0000 mg | Freq: Once | INTRAMUSCULAR | Status: AC
  Administered 2021-03-12: 30 mg via INTRAVENOUS

## 2021-03-12 MED ORDER — ONDANSETRON HCL 4 MG/2ML IJ SOLN: 4.0000 mg | Freq: Four times a day (QID) | INTRAMUSCULAR | Status: DC | PRN

## 2021-03-12 MED ORDER — HYDROMORPHONE HCL 1 MG/ML IJ SOLN
INTRAMUSCULAR | Status: AC
  Filled 2021-03-12: qty 1

## 2021-03-12 MED ORDER — MIDAZOLAM HCL 2 MG/2ML IJ SOLN
0.5000 mg | Freq: Once | INTRAMUSCULAR | Status: AC | PRN
  Administered 2021-03-12: 1 mg via INTRAVENOUS

## 2021-03-12 MED ORDER — ACETAMINOPHEN 500 MG PO TABS: 1000.0000 mg | ORAL_TABLET | ORAL | Status: AC

## 2021-03-12 MED ORDER — CHLORHEXIDINE GLUCONATE CLOTH 2 % EX PADS: 6.0000 | MEDICATED_PAD | Freq: Every day | CUTANEOUS | Status: DC

## 2021-03-12 MED ORDER — MIDAZOLAM HCL 2 MG/2ML IJ SOLN
INTRAMUSCULAR | Status: AC
  Filled 2021-03-12: qty 2

## 2021-03-12 MED ORDER — SUGAMMADEX SODIUM 200 MG/2ML IV SOLN
INTRAVENOUS | Status: DC | PRN
  Administered 2021-03-12: 200 mg via INTRAVENOUS

## 2021-03-12 MED ORDER — DEXAMETHASONE SODIUM PHOSPHATE 10 MG/ML IJ SOLN
INTRAMUSCULAR | Status: AC
  Filled 2021-03-12: qty 1

## 2021-03-12 MED ORDER — PROPOFOL 1000 MG/100ML IV EMUL
INTRAVENOUS | Status: AC
  Filled 2021-03-12: qty 100

## 2021-03-12 MED ORDER — LEVETIRACETAM 500 MG PO TABS
500.0000 mg | ORAL_TABLET | Freq: Every day | ORAL | Status: DC
  Filled 2021-03-12: qty 1

## 2021-03-12 MED ORDER — LEVETIRACETAM 500 MG PO TABS
500.0000 mg | ORAL_TABLET | Freq: Every day | ORAL | Status: DC | PRN
  Filled 2021-03-12: qty 1

## 2021-03-12 MED ORDER — MAGNESIUM HYDROXIDE 400 MG/5ML PO SUSP: 30.0000 mL | Freq: Every day | ORAL | Status: DC | PRN

## 2021-03-12 MED ORDER — OXYCODONE HCL 5 MG PO TABS
5.0000 mg | ORAL_TABLET | Freq: Once | ORAL | Status: DC | PRN
Start: 2021-03-12 — End: 2021-03-12

## 2021-03-12 MED ORDER — SODIUM CHLORIDE 0.9% FLUSH: 9.0000 mL | INTRAVENOUS | Status: DC | PRN

## 2021-03-12 MED ORDER — SODIUM CHLORIDE 0.9% FLUSH: 10.0000 mL | INTRAVENOUS | Status: DC | PRN

## 2021-03-12 MED ORDER — DIPHENHYDRAMINE HCL 12.5 MG/5ML PO ELIX: 12.5000 mg | ORAL_SOLUTION | Freq: Four times a day (QID) | ORAL | Status: DC | PRN

## 2021-03-12 MED ORDER — OXYCODONE-ACETAMINOPHEN 5-325 MG PO TABS: 1.0000 | ORAL_TABLET | ORAL | 0 refills | Status: AC | PRN

## 2021-03-12 MED ORDER — NALOXONE HCL 0.4 MG/ML IJ SOLN: 0.4000 mg | INTRAMUSCULAR | Status: DC | PRN

## 2021-03-12 MED ORDER — PROPOFOL 500 MG/50ML IV EMUL
INTRAVENOUS | Status: DC | PRN
  Administered 2021-03-12: 150 ug/kg/min via INTRAVENOUS

## 2021-03-12 MED ORDER — STERILE WATER FOR IRRIGATION IR SOLN
Status: DC | PRN
Start: 2021-03-12 — End: 2021-03-12
  Administered 2021-03-12: 1000 mL via INTRAVESICAL

## 2021-03-12 MED ORDER — ROCURONIUM BROMIDE 100 MG/10ML IV SOLN
INTRAVENOUS | Status: DC | PRN
  Administered 2021-03-12: 20 mg via INTRAVENOUS
  Administered 2021-03-12: 60 mg via INTRAVENOUS

## 2021-03-12 MED ORDER — ACETAMINOPHEN 500 MG PO TABS: 1000.0000 mg | ORAL_TABLET | Freq: Once | ORAL | Status: DC

## 2021-03-12 MED ORDER — ACETAMINOPHEN 500 MG PO TABS
ORAL_TABLET | ORAL | Status: AC
  Administered 2021-03-12: 1000 mg via ORAL
  Filled 2021-03-12: qty 2

## 2021-03-12 MED ORDER — HYDROMORPHONE HCL 1 MG/ML IJ SOLN
0.5000 mg | INTRAMUSCULAR | Status: AC
  Administered 2021-03-12 (×3): 0.5 mg via INTRAVENOUS

## 2021-03-12 MED ORDER — PHENYLEPHRINE 40 MCG/ML (10ML) SYRINGE FOR IV PUSH (FOR BLOOD PRESSURE SUPPORT)
PREFILLED_SYRINGE | INTRAVENOUS | Status: AC
  Filled 2021-03-12: qty 10

## 2021-03-12 MED ORDER — DEXAMETHASONE SODIUM PHOSPHATE 10 MG/ML IJ SOLN
INTRAMUSCULAR | Status: DC | PRN
  Administered 2021-03-12: 5 mg via INTRAVENOUS

## 2021-03-12 MED ORDER — LIDOCAINE 2% (20 MG/ML) 5 ML SYRINGE
INTRAMUSCULAR | Status: DC | PRN
  Administered 2021-03-12: 20 mg via INTRAVENOUS

## 2021-03-12 MED ORDER — KETOROLAC TROMETHAMINE 30 MG/ML IJ SOLN
INTRAMUSCULAR | Status: AC
  Filled 2021-03-12: qty 1

## 2021-03-12 MED ORDER — MIDAZOLAM HCL 5 MG/5ML IJ SOLN
INTRAMUSCULAR | Status: DC | PRN
  Administered 2021-03-12 (×2): 2 mg via INTRAVENOUS

## 2021-03-12 MED ORDER — ONDANSETRON HCL 4 MG/2ML IJ SOLN
INTRAMUSCULAR | Status: DC | PRN
  Administered 2021-03-12: 4 mg via INTRAVENOUS

## 2021-03-12 MED ORDER — POVIDONE-IODINE 10 % EX SWAB
2.0000 "application " | Freq: Once | CUTANEOUS | Status: AC
  Administered 2021-03-12: 2 via TOPICAL

## 2021-03-12 MED ORDER — HYDROMORPHONE HCL 1 MG/ML IJ SOLN
0.2500 mg | INTRAMUSCULAR | Status: DC | PRN
Start: 2021-03-12 — End: 2021-03-12
  Administered 2021-03-12 (×5): 0.5 mg via INTRAVENOUS

## 2021-03-12 MED ORDER — PROPOFOL 10 MG/ML IV BOLUS
INTRAVENOUS | Status: DC | PRN
  Administered 2021-03-12: 120 mg via INTRAVENOUS
  Administered 2021-03-12: 30 mg via INTRAVENOUS

## 2021-03-12 MED ORDER — IBUPROFEN 800 MG PO TABS
800.0000 mg | ORAL_TABLET | Freq: Three times a day (TID) | ORAL | Status: DC
  Administered 2021-03-12 – 2021-03-13 (×3): 800 mg via ORAL
  Filled 2021-03-12 (×3): qty 1

## 2021-03-12 MED ORDER — SIMETHICONE 80 MG PO CHEW
80.0000 mg | CHEWABLE_TABLET | Freq: Four times a day (QID) | ORAL | Status: DC | PRN
Start: 2021-03-12 — End: 2021-03-13

## 2021-03-12 MED ORDER — SODIUM CHLORIDE 0.9% FLUSH: 10.0000 mL | Freq: Two times a day (BID) | INTRAVENOUS | Status: DC

## 2021-03-12 MED ORDER — PANTOPRAZOLE SODIUM 40 MG PO TBEC
40.0000 mg | DELAYED_RELEASE_TABLET | Freq: Every day | ORAL | Status: DC
  Administered 2021-03-13: 40 mg via ORAL
  Filled 2021-03-12: qty 1

## 2021-03-12 MED ORDER — LIDOCAINE 2% (20 MG/ML) 5 ML SYRINGE
INTRAMUSCULAR | Status: AC
  Filled 2021-03-12: qty 5

## 2021-03-12 MED ORDER — SCOPOLAMINE 1 MG/3DAYS TD PT72
1.0000 | MEDICATED_PATCH | TRANSDERMAL | Status: DC
  Administered 2021-03-12: 1.5 mg via TRANSDERMAL
  Filled 2021-03-12: qty 1

## 2021-03-12 MED ORDER — PHENYLEPHRINE 40 MCG/ML (10ML) SYRINGE FOR IV PUSH (FOR BLOOD PRESSURE SUPPORT)
PREFILLED_SYRINGE | INTRAVENOUS | Status: DC | PRN
  Administered 2021-03-12: 40 ug via INTRAVENOUS

## 2021-03-12 MED ORDER — ONDANSETRON HCL 4 MG/2ML IJ SOLN
INTRAMUSCULAR | Status: AC
  Filled 2021-03-12: qty 2

## 2021-03-12 MED ORDER — SODIUM CHLORIDE 0.9 % IR SOLN
Status: DC | PRN
  Administered 2021-03-12: 1000 mL

## 2021-03-12 MED ORDER — DIPHENHYDRAMINE HCL 50 MG/ML IJ SOLN
12.5000 mg | Freq: Four times a day (QID) | INTRAMUSCULAR | Status: DC | PRN
Start: 2021-03-12 — End: 2021-03-13

## 2021-03-12 MED ORDER — DEXMEDETOMIDINE (PRECEDEX) IN NS 20 MCG/5ML (4 MCG/ML) IV SYRINGE
PREFILLED_SYRINGE | INTRAVENOUS | Status: DC | PRN
  Administered 2021-03-12 (×2): 4 ug via INTRAVENOUS
  Administered 2021-03-12: 8 ug via INTRAVENOUS
  Administered 2021-03-12: 4 ug via INTRAVENOUS

## 2021-03-12 MED ORDER — DEXMEDETOMIDINE (PRECEDEX) IN NS 20 MCG/5ML (4 MCG/ML) IV SYRINGE
PREFILLED_SYRINGE | INTRAVENOUS | Status: AC
  Filled 2021-03-12: qty 5

## 2021-03-12 MED ORDER — IBUPROFEN 600 MG PO TABS: 600.0000 mg | ORAL_TABLET | Freq: Four times a day (QID) | ORAL | 1 refills | Status: DC | PRN

## 2021-03-12 MED ORDER — OXYCODONE HCL 5 MG PO TABS: 5.0000 mg | ORAL_TABLET | ORAL | Status: DC | PRN

## 2021-03-12 MED ORDER — ACETAMINOPHEN 500 MG PO TABS
1000.0000 mg | ORAL_TABLET | Freq: Four times a day (QID) | ORAL | Status: DC
  Administered 2021-03-12 – 2021-03-13 (×3): 1000 mg via ORAL
  Filled 2021-03-12 (×3): qty 2

## 2021-03-12 MED ORDER — LACOSAMIDE 200 MG PO TABS: 100.0000 mg | ORAL_TABLET | Freq: Every day | ORAL | Status: DC

## 2021-03-12 MED ORDER — EPHEDRINE 5 MG/ML INJ
INTRAVENOUS | Status: AC
  Filled 2021-03-12: qty 10

## 2021-03-12 MED ORDER — ONDANSETRON HCL 4 MG PO TABS: 4.0000 mg | ORAL_TABLET | Freq: Four times a day (QID) | ORAL | Status: DC | PRN

## 2021-03-12 MED ORDER — GENTAMICIN SULFATE 40 MG/ML IJ SOLN
5.0000 mg/kg | INTRAVENOUS | Status: AC
  Administered 2021-03-12: 240 mg via INTRAVENOUS
  Filled 2021-03-12: qty 6

## 2021-03-12 MED ORDER — ALBUMIN HUMAN 5 % IV SOLN: INTRAVENOUS | Status: DC | PRN

## 2021-03-12 MED ORDER — OXYCODONE HCL 5 MG/5ML PO SOLN
5.0000 mg | Freq: Once | ORAL | Status: DC | PRN
Start: 2021-03-12 — End: 2021-03-12

## 2021-03-12 MED ORDER — BUPIVACAINE HCL (PF) 0.25 % IJ SOLN
INTRAMUSCULAR | Status: DC | PRN
  Administered 2021-03-12: 20 mL

## 2021-03-12 MED ORDER — HYDROMORPHONE 1 MG/ML IV SOLN
INTRAVENOUS | Status: AC
Start: 2021-03-12 — End: 2021-03-13
  Administered 2021-03-12: 30 mg via INTRAVENOUS
  Administered 2021-03-13: 2.6 mL via INTRAVENOUS
  Filled 2021-03-12: qty 30

## 2021-03-12 MED ORDER — FENTANYL CITRATE (PF) 250 MCG/5ML IJ SOLN
INTRAMUSCULAR | Status: AC
  Filled 2021-03-12: qty 5

## 2021-03-12 MED ORDER — PROMETHAZINE HCL 25 MG/ML IJ SOLN: 6.2500 mg | INTRAMUSCULAR | Status: DC | PRN

## 2021-03-12 MED ORDER — KETOROLAC TROMETHAMINE 15 MG/ML IJ SOLN
INTRAMUSCULAR | Status: DC | PRN
  Administered 2021-03-12: 15 mg via INTRAVENOUS

## 2021-03-12 MED ORDER — CHLORHEXIDINE GLUCONATE 0.12 % MT SOLN
OROMUCOSAL | Status: AC
  Administered 2021-03-12: 15 mL
  Filled 2021-03-12: qty 15

## 2021-03-12 MED ORDER — CLINDAMYCIN PHOSPHATE 900 MG/50ML IV SOLN
900.0000 mg | INTRAVENOUS | Status: AC
  Administered 2021-03-12: 900 mg via INTRAVENOUS

## 2021-03-12 MED ORDER — MEPERIDINE HCL 25 MG/ML IJ SOLN: 6.2500 mg | INTRAMUSCULAR | Status: DC | PRN

## 2021-03-12 SURGICAL SUPPLY — 42 items
ADH SKN CLS APL DERMABOND .7 (GAUZE/BANDAGES/DRESSINGS) ×2
BARRIER ADHS 3X4 INTERCEED (GAUZE/BANDAGES/DRESSINGS) IMPLANT
BRR ADH 4X3 ABS CNTRL BYND (GAUZE/BANDAGES/DRESSINGS)
COVER MAYO STAND STRL (DRAPES) ×3 IMPLANT
DERMABOND ADVANCED (GAUZE/BANDAGES/DRESSINGS) ×1
DERMABOND ADVANCED .7 DNX12 (GAUZE/BANDAGES/DRESSINGS) ×2 IMPLANT
DEVICE SUTURE ENDOST 10MM (ENDOMECHANICALS) ×1 IMPLANT
DURAPREP 26ML APPLICATOR (WOUND CARE) ×3 IMPLANT
FILTER SMOKE EVAC LAPAROSHD (FILTER) ×3 IMPLANT
GLOVE SURG UNDER POLY LF SZ7 (GLOVE) ×9 IMPLANT
GOWN STRL REUS W/ TWL LRG LVL3 (GOWN DISPOSABLE) ×6 IMPLANT
GOWN STRL REUS W/TWL LRG LVL3 (GOWN DISPOSABLE) ×9
HIBICLENS CHG 4% 4OZ BTL (MISCELLANEOUS) ×3 IMPLANT
KIT TURNOVER KIT B (KITS) ×3 IMPLANT
NS IRRIG 1000ML POUR BTL (IV SOLUTION) ×3 IMPLANT
OCCLUDER COLPOPNEUMO (BALLOONS) ×3 IMPLANT
PACK LAPAROSCOPY BASIN (CUSTOM PROCEDURE TRAY) ×3 IMPLANT
PACK TRENDGUARD 450 HYBRID PRO (MISCELLANEOUS) IMPLANT
PROTECTOR NERVE ULNAR (MISCELLANEOUS) ×6 IMPLANT
SCISSORS LAP 5X35 DISP (ENDOMECHANICALS) IMPLANT
SET CYSTO W/LG BORE CLAMP LF (SET/KITS/TRAYS/PACK) ×3 IMPLANT
SET IRRIG TUBING LAPAROSCOPIC (IRRIGATION / IRRIGATOR) ×3 IMPLANT
SET TRI-LUMEN FLTR TB AIRSEAL (TUBING) ×3 IMPLANT
SHEARS HARMONIC ACE PLUS 36CM (ENDOMECHANICALS) ×3 IMPLANT
SLEEVE ENDOPATH XCEL 5M (ENDOMECHANICALS) ×3 IMPLANT
SUT ENDO VLOC 180-0-8IN (SUTURE) ×3 IMPLANT
SUT VIC AB 0 CT1 27 (SUTURE) ×6
SUT VIC AB 0 CT1 27XBRD ANBCTR (SUTURE) ×4 IMPLANT
SUT VICRYL 0 UR6 27IN ABS (SUTURE) ×3 IMPLANT
SUT VICRYL 4-0 PS2 18IN ABS (SUTURE) ×6 IMPLANT
SYR 10ML LL (SYRINGE) ×3 IMPLANT
SYR 50ML LL SCALE MARK (SYRINGE) ×3 IMPLANT
SYSTEM CARTER THOMASON II (TROCAR) ×4 IMPLANT
TIP UTERINE 5.1X6CM LAV DISP (MISCELLANEOUS) ×1 IMPLANT
TOWEL GREEN STERILE FF (TOWEL DISPOSABLE) ×6 IMPLANT
TRAY FOLEY W/BAG SLVR 14FR (SET/KITS/TRAYS/PACK) ×3 IMPLANT
TRENDGUARD 450 HYBRID PRO PACK (MISCELLANEOUS) ×3
TROCAR PORT AIRSEAL 5X120 (TROCAR) ×3 IMPLANT
TROCAR XCEL NON-BLD 11X100MML (ENDOMECHANICALS) ×3 IMPLANT
TROCAR XCEL NON-BLD 5MMX100MML (ENDOMECHANICALS) ×3 IMPLANT
UNDERPAD 30X36 HEAVY ABSORB (UNDERPADS AND DIAPERS) ×3 IMPLANT
WARMER LAPAROSCOPE (MISCELLANEOUS) ×3 IMPLANT

## 2021-03-12 NOTE — Anesthesia Postprocedure Evaluation (Signed)
Anesthesia Post Note  Patient: [name]  Procedure(s) Performed: TOTAL LAPAROSCOPIC HYSTERECTOMY WITH SALPINGECTOMY (Bilateral ) CYSTOSCOPY (N/A )     Patient location during evaluation: PACU Anesthesia Type: General Level of consciousness: awake and alert, patient cooperative and oriented Pain control: pain improving. Vital Signs Assessment: post-procedure vital signs reviewed and stable Respiratory status: spontaneous breathing, nonlabored ventilation and respiratory function stable Cardiovascular status: blood pressure returned to baseline and stable Postop Assessment: no apparent nausea or vomiting Anesthetic complications: no   No complications documented.  Last Vitals:  Vitals:   03/12/21 1425 03/12/21 1441  BP: (!) 99/56 96/70  Pulse: (!) 49 (!) 50  Resp: 12   Temp:  36.4 C  SpO2: 100% 100%    Last Pain:  Vitals:   03/12/21 1425  TempSrc:   PainSc: 5                  Vincenzina Jagoda,E. Eilee Schader

## 2021-03-12 NOTE — Interval H&P Note (Signed)
History and Physical Interval Note: Pt seen. Coping well. No change since H/P done Confirmed her support person will be allowed to stay with her pre and post operatively.  To OR when ready  03/12/2021 9:33 AM  [name]  has presented today for surgery, with the diagnosis of cyst or ovary.  The various methods of treatment have been discussed with the patient and family. After consideration of risks, benefits and other options for treatment, the patient has consented to  Procedure(s): TOTAL LAPAROSCOPIC HYSTERECTOMY WITH SALPINGECTOMY (Bilateral) CYSTOSCOPY (N/A) as a surgical intervention.  The patient's history has been reviewed, patient examined, no change in status, stable for surgery.  I have reviewed the patient's chart and labs.  Questions were answered to the patient's satisfaction.     Isaiah Serge

## 2021-03-12 NOTE — Discharge Instructions (Signed)
Call office with any concerns [phone] 

## 2021-03-12 NOTE — Op Note (Signed)
Operative Note    Preoperative Diagnosis 1. Dysmenorrhea 2. Seizure disorder   Postoperative Diagnosis: Same   Procedure: Total laparoscopic hysterectomy with bilateral salpingectomy, cystoscopy   Surgeon: Mickle Mallory DO Assist: Meisinger, T MD  Anesthesia: Spinal  Fluids: LR 1233ml crystalloid; 235ml albumen EBL: 64ml UOP: 48ml   Findings : Grossly normal uterus, tubes and ovaries. Normal pelvis Uterus 7.5cm    Specimen: Uterus, cervix and bilateral fallopian tubes   Procedure Note  Pt seen in pre-op. Procedure reviewed and all questions answered; consent verified  Pt taken to operating room. General anesthesia was administered and found to be adequate. Pt was prepped and draped in sterile fashion and a timeout performed. Her arms were position at her sides and she was placed in dorsal lithotomy position.  A speculum was placed in the vagina with excellent visualization of the cervix obtained. Uterus was sounded to 7.5cm so a size 6 koh was assembled with a small cup and placed with retention sutures at 3 and 9 o'clock. A foley catheter was also placed in a sterile fashion Legs were then lowered and attention turned to her abdomen.  Here a 12mm incision was made at the umbilicus. A 62mm optiview trocar was then placed with the abdomen tented upwards. The laparoscopic camera was used to confirm placement and pneumoperitoneum obtained with CO2 gas to 35mmHg. The patient was placed in trendelenberg and gross survey of pelvis done.Normal uterus, tubes and ovaries noted. Grossly normal pelvis observed.  At this time a 25mm airseal port was then placed under direct visualization in right lower quadrant and an 11 port in the left with care taken to avoid the epigastric vessels. Further exploration noted  Both ureters were visualized along lateral sidewalls.  Starting on the the patients left, the left fallopian tube was then grasped with a blunt grasper, elevated and excised using the  harmonic hemostatically. Next the utero-ovarian ligament and the round ligaments were sequentially grasped and excised. The broad ligament was then separated from the uterus as well with a bladder flap created. The cardinal ligament was then excised next at the utero-cervical junction and the ovarian vessel clamped, cauterized and cut. The same was done on the patients right with similar results.  Next the bladder reflection was dissected away. The vaginal occluder was noted to maintain pressure having been filled with 60cc of saline. Starting anteriorly and working laterally the uterus and cervix were amputated off the superior vagina. The uterus, cervix and fallopian tubes were removed.  The pelvis was irrigated and hemostasis noted. The angles of the vaginal cuff were easily seen and using the endostitch with an 0 vicryl v-lock suture, the cuff was closed in a running fashion with 4-5 back stitches to ensure closure.  Further irrigation of the pelvis confirmed no abnormalities or bleeding hence patient was flattened. The 11 port site was closed with 0-vicryl suture with a carter thomasen to ensure closure of the peritoneum.  The remaining  trocars were removed under direct visualization and gas allowed to escape.  Incision sites were closed with 4-0 vicryl suture and dermabond. Counts were noted to be correct. A cystoscopy was performed. After 56ml of florescein and a fluid bolus, both ureters were noted to spout yellow jets.  Patient was awakened and taken to recovery room in stable status.  Counts were confirmed to be corrent

## 2021-03-12 NOTE — Progress Notes (Signed)
Pt OOB ambulating in the hallway. Toya Smothers, RN

## 2021-03-12 NOTE — Transfer of Care (Signed)
Immediate Anesthesia Transfer of Care Note  Patient: [name]  Procedure(s) Performed: TOTAL LAPAROSCOPIC HYSTERECTOMY WITH SALPINGECTOMY (Bilateral ) CYSTOSCOPY (N/A )  Patient Location: PACU  Anesthesia Type:General  Level of Consciousness: sedated and unresponsive  Airway & Oxygen Therapy: Patient Spontanous Breathing and Patient connected to face mask oxygen  Post-op Assessment: Report given to RN and Post -op Vital signs reviewed and stable  Post vital signs: Reviewed and stable  Last Vitals:  Vitals Value Taken Time  BP    Temp    Pulse    Resp    SpO2      Last Pain:  Vitals:   03/12/21 0854  TempSrc:   PainSc: 0-No pain         Complications: No complications documented.

## 2021-03-12 NOTE — Anesthesia Procedure Notes (Signed)
Procedure Name: Intubation Date/Time: 03/12/2021 10:42 AM Performed by: Hendricks Limes, CRNA Pre-anesthesia Checklist: Patient identified, Emergency Drugs available, Suction available and Patient being monitored Patient Re-evaluated:Patient Re-evaluated prior to induction Oxygen Delivery Method: Circle system utilized Preoxygenation: Pre-oxygenation with 100% oxygen Induction Type: IV induction Ventilation: Mask ventilation without difficulty Laryngoscope Size: Mac and 3 Grade View: Grade I Tube type: Oral Tube size: 6.5 mm Number of attempts: 1 Airway Equipment and Method: Stylet Placement Confirmation: ETT inserted through vocal cords under direct vision,  positive ETCO2 and breath sounds checked- equal and bilateral Secured at: 21 cm Tube secured with: Tape Dental Injury: Teeth and Oropharynx as per pre-operative assessment
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
## 2021-03-12 NOTE — Anesthesia Preprocedure Evaluation (Addendum)
Anesthesia Evaluation  Patient identified by MRN, date of birth, ID band Patient awake    Reviewed: Allergy & Precautions, NPO status , Patient's Chart, lab work & pertinent test results  History of Anesthesia Complications (+) PONV  Airway Mallampati: II  TM Distance: >3 FB Neck ROM: Full    Dental  (+) Dental Advisory Given, Teeth Intact   Pulmonary COPD,  COPD inhaler,  03/08/2021 SARS coronavirus NEG   breath sounds clear to auscultation       Cardiovascular negative cardio ROS   Rhythm:Regular Rate:Normal  '19 ECHO: EF 60-65%. Wall motion was normal; no regional wallmotion abnormalities, no significant valvular abnormalities   Neuro/Psych  Headaches, Seizures - (seizes several times/week), Poorly Controlled,  PSYCHIATRIC DISORDERS Anxiety PTSD   GI/Hepatic Neg liver ROS, GERD  Controlled,  Endo/Other  negative endocrine ROS  Renal/GU negative Renal ROS     Musculoskeletal   Abdominal   Peds  Hematology negative hematology ROS (+)   Anesthesia Other Findings   Reproductive/Obstetrics                            Anesthesia Physical Anesthesia Plan  ASA: III  Anesthesia Plan: General   Post-op Pain Management:    Induction:   PONV Risk Score and Plan: 4 or greater and Scopolamine patch - Pre-op, Dexamethasone and Ondansetron  Airway Management Planned: Oral ETT  Additional Equipment: None  Intra-op Plan:   Post-operative Plan: Extubation in OR  Informed Consent: I have reviewed the patients History and Physical, chart, labs and discussed the procedure including the risks, benefits and alternatives for the proposed anesthesia with the patient or authorized representative who has indicated his/her understanding and acceptance.     Dental advisory given  Plan Discussed with: CRNA and Surgeon  Anesthesia Plan Comments:        Anesthesia Quick Evaluation

## 2021-03-13 ENCOUNTER — Encounter (HOSPITAL_COMMUNITY): Payer: Self-pay | Admitting: Obstetrics and Gynecology

## 2021-03-13 DIAGNOSIS — Z880 Allergy status to penicillin: Secondary | ICD-10-CM | POA: Diagnosis not present

## 2021-03-13 DIAGNOSIS — N840 Polyp of corpus uteri: Secondary | ICD-10-CM | POA: Diagnosis not present

## 2021-03-13 DIAGNOSIS — Z91040 Latex allergy status: Secondary | ICD-10-CM | POA: Diagnosis not present

## 2021-03-13 DIAGNOSIS — N946 Dysmenorrhea, unspecified: Secondary | ICD-10-CM | POA: Diagnosis not present

## 2021-03-13 DIAGNOSIS — N926 Irregular menstruation, unspecified: Secondary | ICD-10-CM | POA: Diagnosis not present

## 2021-03-13 DIAGNOSIS — N838 Other noninflammatory disorders of ovary, fallopian tube and broad ligament: Secondary | ICD-10-CM | POA: Diagnosis not present

## 2021-03-13 DIAGNOSIS — G40909 Epilepsy, unspecified, not intractable, without status epilepticus: Secondary | ICD-10-CM | POA: Diagnosis not present

## 2021-03-13 LAB — SURGICAL PATHOLOGY

## 2021-03-13 MED ORDER — VALIUM 2 MG PO TABS: ORAL_TABLET | ORAL | 0 refills | Status: DC

## 2021-03-13 MED ORDER — HEPARIN SOD (PORK) LOCK FLUSH 100 UNIT/ML IV SOLN
500.0000 [IU] | INTRAVENOUS | Status: AC | PRN
  Administered 2021-03-13: 500 [IU]
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
## 2021-03-13 NOTE — Discharge Summary (Signed)
Physician Discharge Summary  Patient ID: [name] MRN: [id number] DOB/AGE: [birth year] 30 y.o.  Admit date: 03/12/2021 Discharge date: 03/13/2021  Admission Diagnoses: Dyspareunia and seizure disorder triggered by menses  Discharge Diagnoses: Same Active Problems:   Dysmenorrhea   Post-operative state   Discharged Condition: stable  Hospital Course: Uncomplicated  Consults: None  Significant Diagnostic Studies: n/a  Treatments: analgesia: Dilaudid  Discharge Exam: Blood pressure (!) 98/51, pulse (!) 47, temperature 98.6 F (37 C), temperature source Oral, resp. rate 18, height 5\' 4"  (1.626 m), weight 47.6 kg, last menstrual period 02/11/2021, SpO2 99 %. General appearance: alert, cooperative and no distress GI: normal findings: non distended Extremities: extremities normal, atraumatic, no cyanosis or edema  Disposition: Discharge disposition: 01-Home or Self Care       Discharge Instructions    Call MD for:  difficulty breathing, headache or visual disturbances   Complete by: As directed    Call MD for:  difficulty breathing, headache or visual disturbances   Complete by: As directed    Call MD for:  persistant nausea and vomiting   Complete by: As directed    Call MD for:  persistant nausea and vomiting   Complete by: As directed    Call MD for:  redness, tenderness, or signs of infection (pain, swelling, redness, odor or green/yellow discharge around incision site)   Complete by: As directed    Call MD for:  redness, tenderness, or signs of infection (pain, swelling, redness, odor or green/yellow discharge around incision site)   Complete by: As directed    Call MD for:  severe uncontrolled pain   Complete by: As directed    Call MD for:  severe uncontrolled pain   Complete by: As directed    Call MD for:  temperature >100.4   Complete by: As directed    Call MD for:  temperature >100.4   Complete by: As directed    Diet - low sodium heart healthy    Complete by: As directed    Diet general   Complete by: As directed    Discharge instructions   Complete by: As directed    Call office with any concerns [phone]   Discharge instructions   Complete by: As directed    Call office with any concerns [phone]   Discharge wound care:   Complete by: As directed    May shower, pat dry   Discharge wound care:   Complete by: As directed    May shower, pat dry   Driving Restrictions   Complete by: As directed    None for 6 weeks or while taking narcotic medication   Driving Restrictions   Complete by: As directed    None while taking narcotic pain medication   Increase activity slowly   Complete by: As directed    Increase activity slowly   Complete by: As directed    Lifting restrictions   Complete by: As directed    <15 mins   Lifting restrictions   Complete by: As directed    <15lbs   Sexual Activity Restrictions   Complete by: As directed    None for 6 weeks   Sexual Activity Restrictions   Complete by: As directed    None for 6 weeks       Follow-up Information    Go to Sherlyn Hay, DO.   Specialty: Obstetrics and Gynecology Why: Post operative visit in two weeks  Contact information: [address] [place] [address] [phone]               Signed: Isaiah Serge 03/13/2021, 9:00 AM

## 2021-03-15 DIAGNOSIS — F4312 Post-traumatic stress disorder, chronic: Secondary | ICD-10-CM | POA: Diagnosis not present

## 2021-03-15 DIAGNOSIS — F840 Autistic disorder: Secondary | ICD-10-CM | POA: Diagnosis not present

## 2021-03-19 DIAGNOSIS — F4312 Post-traumatic stress disorder, chronic: Secondary | ICD-10-CM | POA: Diagnosis not present

## 2021-03-19 DIAGNOSIS — F840 Autistic disorder: Secondary | ICD-10-CM | POA: Diagnosis not present

## 2021-03-21 ENCOUNTER — Other Ambulatory Visit: Payer: Self-pay

## 2021-03-21 ENCOUNTER — Emergency Department (HOSPITAL_BASED_OUTPATIENT_CLINIC_OR_DEPARTMENT_OTHER)
Admission: EM | Admit: 2021-03-21 | Discharge: 2021-03-21 | Disposition: A | Discharge status: Home / Self Care | Payer: BC Managed Care – PPO | Attending: Emergency Medicine | Admitting: Emergency Medicine

## 2021-03-21 ENCOUNTER — Emergency Department (HOSPITAL_BASED_OUTPATIENT_CLINIC_OR_DEPARTMENT_OTHER): Payer: BC Managed Care – PPO

## 2021-03-21 ENCOUNTER — Encounter (HOSPITAL_BASED_OUTPATIENT_CLINIC_OR_DEPARTMENT_OTHER): Payer: Self-pay | Admitting: Emergency Medicine

## 2021-03-21 DIAGNOSIS — N939 Abnormal uterine and vaginal bleeding, unspecified: Secondary | ICD-10-CM | POA: Diagnosis not present

## 2021-03-21 DIAGNOSIS — R0602 Shortness of breath: Secondary | ICD-10-CM | POA: Insufficient documentation

## 2021-03-21 DIAGNOSIS — J45909 Unspecified asthma, uncomplicated: Secondary | ICD-10-CM | POA: Diagnosis not present

## 2021-03-21 DIAGNOSIS — F4312 Post-traumatic stress disorder, chronic: Secondary | ICD-10-CM | POA: Diagnosis not present

## 2021-03-21 DIAGNOSIS — R0781 Pleurodynia: Secondary | ICD-10-CM

## 2021-03-21 DIAGNOSIS — R1011 Right upper quadrant pain: Secondary | ICD-10-CM | POA: Insufficient documentation

## 2021-03-21 DIAGNOSIS — Z91040 Latex allergy status: Secondary | ICD-10-CM | POA: Insufficient documentation

## 2021-03-21 DIAGNOSIS — F840 Autistic disorder: Secondary | ICD-10-CM | POA: Diagnosis not present

## 2021-03-21 DIAGNOSIS — R079 Chest pain, unspecified: Secondary | ICD-10-CM | POA: Diagnosis not present

## 2021-03-21 LAB — CBC WITH DIFFERENTIAL/PLATELET
Abs Immature Granulocytes: 0.02 10*3/uL (ref 0.00–0.07)
Basophils Absolute: 0 10*3/uL (ref 0.0–0.1)
Basophils Relative: 0 %
Eosinophils Absolute: 0.3 10*3/uL (ref 0.0–0.5)
Eosinophils Relative: 3 %
HCT: 36 % (ref 36.0–46.0)
Hemoglobin: 12.5 g/dL (ref 12.0–15.0)
Immature Granulocytes: 0 %
Lymphocytes Relative: 17 %
Lymphs Abs: 2 10*3/uL (ref 0.7–4.0)
MCH: 30.3 pg (ref 26.0–34.0)
MCHC: 34.7 g/dL (ref 30.0–36.0)
MCV: 87.4 fL (ref 80.0–100.0)
Monocytes Absolute: 0.9 10*3/uL (ref 0.1–1.0)
Monocytes Relative: 8 %
Neutro Abs: 8.5 10*3/uL — ABNORMAL HIGH (ref 1.7–7.7)
Neutrophils Relative %: 72 %
Platelets: 347 10*3/uL (ref 150–400)
RBC: 4.12 MIL/uL (ref 3.87–5.11)
RDW: 12.3 % (ref 11.5–15.5)
WBC: 11.8 10*3/uL — ABNORMAL HIGH (ref 4.0–10.5)
nRBC: 0 % (ref 0.0–0.2)

## 2021-03-21 LAB — COMPREHENSIVE METABOLIC PANEL
ALT: 10 U/L (ref 0–44)
AST: 13 U/L — ABNORMAL LOW (ref 15–41)
Albumin: 4.5 g/dL (ref 3.5–5.0)
Alkaline Phosphatase: 43 U/L (ref 38–126)
Anion gap: 10 (ref 5–15)
BUN: 14 mg/dL (ref 6–20)
CO2: 25 mmol/L (ref 22–32)
Calcium: 9 mg/dL (ref 8.9–10.3)
Chloride: 102 mmol/L (ref 98–111)
Creatinine, Ser: 0.56 mg/dL (ref 0.44–1.00)
GFR, Estimated: >60 mL/min (ref >60)
Glucose, Bld: 84 mg/dL (ref 70–99)
Potassium: 3.9 mmol/L (ref 3.5–5.1)
Sodium: 137 mmol/L (ref 135–145)
Total Bilirubin: 0.4 mg/dL (ref 0.3–1.2)
Total Protein: 7 g/dL (ref 6.5–8.1)

## 2021-03-21 MED ORDER — HEPARIN SOD (PORK) LOCK FLUSH 100 UNIT/ML IV SOLN
500.0000 [IU] | Freq: Once | INTRAVENOUS | Status: AC
  Administered 2021-03-21: 500 [IU]
  Filled 2021-03-21: qty 5

## 2021-03-21 MED ORDER — IOHEXOL 350 MG/ML SOLN
75.0000 mL | Freq: Once | INTRAVENOUS | Status: AC | PRN
  Administered 2021-03-21: 75 mL via INTRAVENOUS

## 2021-03-21 NOTE — Discharge Instructions (Signed)
All the labs and the CAT scans today were normal without particular reason for the pain.  If you start running a fever, have trouble breathing or the pain does not resolve you should follow-up with your OB/GYN.

## 2021-03-21 NOTE — ED Notes (Signed)
Port access, Pt tolerated well

## 2021-03-21 NOTE — ED Provider Notes (Signed)
[name]PT Provider Note   CSN: [id number] Arrival date & time: 03/21/21  1725     History Chief Complaint  Patient presents with  . Rib Injury    [name] is a 30 y.o. female.  Patient is a 30 year old female with a history of complex seizure disorder, GERD and recent hysterectomy 10 days ago presenting today with right upper quadrant and right rib pain.  Patient reports there was no complications with the surgery and she did well.  However yesterday she started noticing right lower rib pain which was worse when taking a deep breath.  The pain is intensified today and now radiating into her back.  Deep breaths seem to make it worse and when moving certain ways it becomes more severe.  She has felt slightly short of breath but denies any cough.  She has had intermittent sweats today but has not noted any fever.  She denies any urinary symptoms.  She is still having some vaginal bleeding but reports it is less than a period.  She has no lower abdominal pain.  She is currently on no hormone supplements and has no prior history of DVT or PE.  The history is provided by the patient.       Past Medical History:  Diagnosis Date  . Acne   . Anemia    hx of  . Anxiety   . Asthma   . Carpal tunnel syndrome    right  . Complex partial seizures ([place])    last seizure 02-20-3021  . Complication of anesthesia    slow to wake up, disoriented, hx of  mild seizure after surgery -2019 gallbladder   . Coordination problem   . 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 Difficult intravenous access    RIGHT CHEST PAC  . Gallstones   . GERD (gastroesophageal reflux disease)   . Head injury, intracranial, with concussion   . Hearing loss    both ears  . Heart murmur    states no known problems, no cardiologist  . History of asthma    no current med.  Marland Kitchen History of spider veins    both legs   . Hypoglycemia    . Joint pain   . 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 Migraines   . Ovarian cyst    Right  . Pneumonia 12/2018   hx of   . PONV (postoperative nausea and vomiting)   . PTSD (post-traumatic stress disorder)   . PTSD (post-traumatic stress disorder)   . Shoulder dislocation 09/2018   Left  . Shoulder subluxation, left   . Syncope   . Tremor, unspecified    bilateral arms  . Wears glasses   . Wears hearing aid    bilateral    Patient Active Problem List   Diagnosis Date Noted  . Dysmenorrhea 03/12/2021  . Post-operative state 03/12/2021  . Problem with vascular access 02/16/2020  . 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  . Pneumomediastinum ([place]) 03/17/2019  . Seizure ([place]) 10/05/2018  . 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 Syncope 05/21/2018  . Seizures ([place]) 12/04/2017  . Cellulitis of chest wall 10/07/2017  . Infected venous access port, initial encounter 10/07/2017  . 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 Partial idiopathic epilepsy with seizures of localized onset, intractable, without status  epilepticus ([place]) 07/12/2015  . Hypoglycemia 06/12/2014  . Migraine headache 06/29/2013  . Asthma 11/27/2011  . Muscle weakness of lower extremity 11/26/2011  . Acute appendicitis with localized peritonitis 11/26/2011  . 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 Seizure disorder ([place]) 08/16/2009  . HEARING LOSS, BILATERAL 05/25/2008    Past Surgical History:  Procedure Laterality Date  . ABDOMINAL HYSTERECTOMY    . CHOLECYSTECTOMY N/A 10/05/2018   Procedure: LAPAROSCOPIC CHOLECYSTECTOMY WITH INTRAOPERATIVE CHOLANGIOGRAM ERAS PATHWAY;  Surgeon: Alphonsa Overall, MD;  Location: [facility];  Service: General;  Laterality: N/A;  . CYSTOSCOPY N/A 03/12/2021   Procedure: CYSTOSCOPY;  Surgeon: Sherlyn Hay, DO;  Location: [place];  Service: Gynecology;  Laterality:  N/A;  . LAPAROSCOPIC APPENDECTOMY  11/26/2011   Procedure: APPENDECTOMY LAPAROSCOPIC;  Surgeon: Gayland Curry, MD;  Location: [place];  Service: General;  Laterality: N/A;  . left shoulder manipulation     in er multilple times last done july 2021  . PORTACATH PLACEMENT Right 08/28/2017   Procedure: POWER PORT PLACEMENT;  Surgeon: Alphonsa Overall, MD;  Location: [place];  Service: General;  Laterality: Right;  . PORTACATH PLACEMENT N/A 12/04/2017   Procedure: INSERTION PORT-A-CATH;  Surgeon: Alphonsa Overall, MD;  Location: [place];  Service: General;  Laterality: N/A;  . RADIOLOGY WITH ANESTHESIA Left 10/25/2019   Procedure: MRI  LEFT SHOLDER WITHOUT CONTRAST;  Surgeon: Radiologist, Medication, MD;  Location: [place];  Service: Radiology;  Laterality: Left;  . REPAIR ANKLE LIGAMENT Left    x 3  . TOTAL LAPAROSCOPIC HYSTERECTOMY WITH SALPINGECTOMY Bilateral 03/12/2021   Procedure: TOTAL LAPAROSCOPIC HYSTERECTOMY WITH SALPINGECTOMY;  Surgeon: Sherlyn Hay, DO;  Location: [facility];  Service: Gynecology;  Laterality: Bilateral;     OB History    Gravida  0   Para  0   Term  0   Preterm  0   AB  0   Living  0     SAB  0   IAB  0   Ectopic  0   Multiple  0   Live Births  0           Family History  Problem Relation Age of Onset  . Polymyalgia rheumatica Mother   . Heart disease Mother   . Sudden Cardiac Death Father        long QT syndrome   . Heart disease Maternal Grandmother   . Breast cancer Maternal Grandmother   . Heart disease Maternal Grandfather   . Stroke Maternal Grandfather   . Diabetes Maternal Grandfather   . COPD Maternal Grandfather   . Kidney failure Maternal Grandfather   . Heart disease Paternal Grandmother   . Heart disease Paternal Grandfather   . Migraines Paternal Grandfather     Social History   Tobacco Use  . Smoking status: Never Smoker  . Smokeless tobacco: Never Used  Vaping Use  . Vaping Use: Never used   Substance Use Topics  . Alcohol use: Never  . Drug use: Never    Home Medications Prior to Admission medications   Medication Sig Start Date End Date Taking? Authorizing Provider  acetaminophen (TYLENOL) 500 MG tablet Take 1,000 mg by mouth 3 (three) times daily as needed for moderate pain or headache.    Yes [provider]  Adapalene 0.3 % gel Apply 1 application topically 2 (two) times daily.   Yes [provider]  albuterol (PROVENTIL HFA;VENTOLIN HFA)  108 (90 Base) MCG/ACT inhaler Inhale 2 puffs into the lungs every 6 (six) hours as needed for wheezing or shortness of breath.    Yes [provider]  clindamycin (CLEOCIN T) 1 % lotion Apply 1 application topically See admin instructions. Apply to affected areas of the face 2 times a day 08/29/18  Yes [provider]  diphenhydrAMINE (BENADRYL) 25 MG tablet Take 25 mg by mouth daily as needed for allergies.   Yes [provider]  Glucagon, rDNA, (GLUCAGON EMERGENCY) 1 MG KIT Use PRN for hypoglycemia 10/09/20  Yes Nafziger, [name], NP  glucose monitoring kit (FREESTYLE) monitoring kit 1 each by Does not apply route as needed for other. Free style libre on right arm   Yes [provider]  ibuprofen (ADVIL) 600 MG tablet Take 1 tablet (600 mg total) by mouth every 6 (six) hours as needed. 03/12/21  Yes Banga, Cecilia Worema, DO  Lacosamide (VIMPAT) 100 MG TABS Take 1 tablet (100 mg total) by mouth 2 (two) times daily. Patient taking differently: Take 100 mg by mouth at bedtime. 08/17/20  Yes Cameron Sprang, MD  levETIRAcetam (KEPPRA) 500 MG tablet Take 1 tablet three times a day Patient taking differently: Take 500 mg by mouth See admin instructions. Daily at bedtime, Once in the morning as needed 08/17/20  Yes Cameron Sprang, MD  Perampanel [facility]) 4 MG TABS Take 2 tablets (8 mg total) by mouth every evening. Patient taking differently: Take 4 mg by mouth every evening. 08/17/20  Yes Cameron Sprang, MD  spironolactone (ALDACTONE) 50 MG tablet Take 50 mg by mouth every evening. 10/10/19  Yes [provider]  Vitamin D, Ergocalciferol, (DRISDOL) 1.25 MG (50000 UNIT) CAPS capsule Take 50,000 Units by mouth every Friday.   Yes [provider]  cloBAZam (ONFI) 10 MG tablet Take 1 tablet daily for 5 days around time of menstrual period for catamenial seizures Patient taking differently: Take 5 mg by mouth See admin instructions. Take 1 tablet daily for 5 days around time of menstrual period for catamenial seizures 08/17/20   Cameron Sprang, MD  Dapsone 5 % topical gel Apply 1 application topically daily.  03/07/20   [provider]  LORazepam (ATIVAN) 0.5 MG tablet Take 1 tablet as needed for seizure. Patient taking differently: Take 0.5 mg by mouth daily as needed for seizure. Take 1 tablet as needed for seizure. 08/17/20   Cameron Sprang, MD  VALIUM 2 MG tablet place vaginally q 12 hrs prn pain in vagina 03/13/21   Banga, Bonnee Quin, DO    Allergies    Cefzil [cefprozil], Cephalosporins, Penicillins, Midazolam, Shellfish allergy, Morphine and related, Latex, and Tegaderm ag mesh [silver]  Review of Systems   Review of Systems  All other systems reviewed and are negative.   Physical Exam Updated Vital Signs BP 115/76 (BP Location: Right Arm)   Pulse 73   Temp 98.3 F (36.8 C) (Oral)   Resp 18   Ht 5' 4" (1.626 m)   Wt 47.6 kg   LMP 02/11/2021   SpO2 100%   BMI 18.02 kg/m   Physical Exam Vitals and nursing note reviewed.  Constitutional:      General: She is not in acute distress.    Appearance: Normal appearance. She is well-developed.  HENT:     Head: Normocephalic and atraumatic.  Eyes:     Pupils: Pupils are equal, round, and reactive to light.  Cardiovascular:  Rate and Rhythm: Normal rate and regular rhythm.     Heart sounds: Normal heart sounds. No murmur heard. No friction rub.  Pulmonary:     Effort: Pulmonary effort is  normal.     Breath sounds: Normal breath sounds. No wheezing or rales.  Chest:     Chest wall: No tenderness.  Abdominal:     General: Bowel sounds are normal. There is no distension.     Palpations: Abdomen is soft.     Tenderness: There is abdominal tenderness. There is right CVA tenderness and guarding. There is no rebound.     Comments: RUQ tenderness.  No lower abd pain.  Laproscopic sites are clean and dry  Musculoskeletal:        General: No tenderness. Normal range of motion.     Right lower leg: No edema.     Left lower leg: No edema.     Comments: No edema  Skin:    General: Skin is warm and dry.     Findings: No rash.  Neurological:     Mental Status: She is alert and oriented to person, place, and time. Mental status is at baseline.     Cranial Nerves: No cranial nerve deficit.  Psychiatric:        Mood and Affect: Mood normal.        Behavior: Behavior normal.        Thought Content: Thought content normal.     ED Results / Procedures / Treatments   Labs (all labs ordered are listed, but only abnormal results are displayed) Labs Reviewed  CBC WITH DIFFERENTIAL/PLATELET - Abnormal; Notable for the following components:      Result Value   WBC 11.8 (*)    Neutro Abs 8.5 (*)    All other components within normal limits  COMPREHENSIVE METABOLIC PANEL - Abnormal; Notable for the following components:   AST 13 (*)    All other components within normal limits    EKG None  Radiology CT Angio Chest PE W and/or Wo Contrast  Result Date: 03/21/2021 CLINICAL DATA:  Ten days postop hysterectomy.  Chest pain EXAM: CT ANGIOGRAPHY CHEST WITH CONTRAST TECHNIQUE: Multidetector CT imaging of the chest was performed using the standard protocol during bolus administration of intravenous contrast. Multiplanar CT image reconstructions and MIPs were obtained to evaluate the vascular anatomy. CONTRAST:  48m OMNIPAQUE IOHEXOL 350 MG/ML SOLN COMPARISON:  None. FINDINGS:  Cardiovascular: No filling defects in the pulmonary arteries to suggest pulmonary emboli. Heart is normal size. Aorta is normal caliber. Mediastinum/Nodes: No mediastinal, hilar, or axillary adenopathy. Trachea and esophagus are unremarkable. Thyroid unremarkable. Right Port-A-Cath tip near the cavoatrial junction. Lungs/Pleura: Lungs are clear. No focal airspace opacities or suspicious nodules. No effusions. Upper Abdomen: Imaging into the upper abdomen demonstrates no acute findings. Musculoskeletal: Chest wall soft tissues are unremarkable. No acute bony abnormality. Review of the MIP images confirms the above findings. IMPRESSION: No evidence of pulmonary embolus. No acute cardiopulmonary disease. Electronically Signed   By: KRolm BaptiseM.D.   On: 03/21/2021 21:49   CT ABDOMEN PELVIS W CONTRAST  Result Date: 03/21/2021 CLINICAL DATA:  Ten days postop hysterectomy. Right upper quadrant pain. EXAM: CT ABDOMEN AND PELVIS WITH CONTRAST TECHNIQUE: Multidetector CT imaging of the abdomen and pelvis was performed using the standard protocol following bolus administration of intravenous contrast. CONTRAST:  740mOMNIPAQUE IOHEXOL 350 MG/ML SOLN COMPARISON:  05/01/2019 FINDINGS: Lower chest: Lung bases are clear. No effusions. Heart is normal  size. Hepatobiliary: No focal liver abnormality is seen. Status post cholecystectomy. No biliary dilatation. Pancreas: No focal abnormality or ductal dilatation. Spleen: No focal abnormality.  Normal size. Adrenals/Urinary Tract: No adrenal abnormality. No focal renal abnormality. No stones or hydronephrosis. Urinary bladder is unremarkable. Stomach/Bowel: Prior appendectomy. Stomach, large and small bowel grossly unremarkable. Vascular/Lymphatic: No evidence of aneurysm or adenopathy. Reproductive: Prior hysterectomy.  No adnexal mass. Other: Trace free fluid in pelvis.  No free air. Musculoskeletal: No acute bony abnormality. IMPRESSION: No acute findings in the abdomen or  pelvis. Electronically Signed   By: Rolm Baptise M.D.   On: 03/21/2021 21:51    Procedures Procedures   Medications Ordered in ED Medications - No data to display  ED Course  I have reviewed the triage vital signs and the nursing notes.  Pertinent labs & imaging results that were available during my care of the patient were reviewed by me and considered in my medical decision making (see chart for details).    MDM Rules/Calculators/A&P                          30 year old female presenting today with right-sided lower rib pain and right upper quadrant pain status post hysterectomy 10 days ago.  Concern for possible PE versus fluid accumulation in the right upper quadrant.  Patient surgical incision site looks well she has no lower abdominal pain or urinary symptoms.  She currently is satting 100% on room air and is not tachycardic.  She does not want any pain medication at this time and just wants evaluation.  She spoke with her doctor prior to coming and they recommend she come here for evaluation.  Low suspicion for UTI, abscess, pneumonia.  Labs and imaging are pending.  Given patient's recent surgery feel that a D-dimer will not be accurate.  We will plan on CTA of the chest and CT abdomen pelvis.  10:11 PM Labs wnl.  CT neg for PE or acute abd process.  Finding discussed with the pt and her mom.  Undetermined cause of pain at this time but pt well appearing and stable for d/c.  She does have pain meds at home to use as needed.  MDM Number of Diagnoses or Management Options   Amount and/or Complexity of Data Reviewed Clinical lab tests: ordered and reviewed Tests in the radiology section of CPT: ordered and reviewed Decide to obtain previous medical records or to obtain history from someone other than the patient: yes Obtain history from someone other than the patient: yes Review and summarize past medical records: yes Discuss the patient with other providers: no Independent  visualization of images, tracings, or specimens: yes  Risk of Complications, Morbidity, and/or Mortality Presenting problems: moderate Diagnostic procedures: moderate Management options: minimal  Patient Progress Patient progress: stable    Final Clinical Impression(s) / ED Diagnoses Final diagnoses:  Rib pain on right side    Rx / DC Orders ED Discharge Orders    None       Blanchie Dessert, MD 03/21/21 2213

## 2021-03-21 NOTE — ED Triage Notes (Signed)
Pt from Home that is 10 days post op from a hysterectomy. Pt states that she is having pain in her right ribs and it is difficult to breathe in. Pt stated that pain started yesterday and has increased today.

## 2021-03-22 ENCOUNTER — Encounter: Payer: Self-pay | Admitting: Neurology

## 2021-03-22 ENCOUNTER — Ambulatory Visit: Payer: BC Managed Care – PPO | Admitting: Neurology

## 2021-03-22 VITALS — BP 111/79 | HR 76 | Resp 18 | Ht 64.0 in

## 2021-03-22 DIAGNOSIS — G40019 Localization-related (focal) (partial) idiopathic epilepsy and epileptic syndromes with seizures of localized onset, intractable, without status epilepticus: Secondary | ICD-10-CM

## 2021-03-22 DIAGNOSIS — G43009 Migraine without aura, not intractable, without status migrainosus: Secondary | ICD-10-CM | POA: Diagnosis not present

## 2021-03-22 MED ORDER — FYCOMPA 4 MG PO TABS: 2.0000 | ORAL_TABLET | Freq: Every evening | ORAL | 5 refills | Status: DC

## 2021-03-22 MED ORDER — NORTRIPTYLINE HCL 10 MG PO CAPS: 10.0000 mg | ORAL_CAPSULE | Freq: Every day | ORAL | 3 refills | Status: DC

## 2021-03-22 MED ORDER — LEVETIRACETAM 500 MG PO TABS
ORAL_TABLET | ORAL | 11 refills | Status: DC
Start: 2021-03-22 — End: 2022-04-14

## 2021-03-22 MED ORDER — LORAZEPAM 0.5 MG PO TABS: ORAL_TABLET | ORAL | 5 refills | Status: DC

## 2021-03-22 MED ORDER — VIMPAT 100 MG PO TABS: 1.0000 | ORAL_TABLET | Freq: Two times a day (BID) | ORAL | 5 refills | Status: DC
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
## 2021-03-22 NOTE — Patient Instructions (Signed)
Good to see you! Continue all your medications. Follow-up in 6 months, call for any changes.  Seizure Precautions: 1. If medication has been prescribed for you to prevent seizures, take it exactly as directed.  Do not stop taking the medicine without talking to your doctor first, even if you have not had a seizure in a long time.   2. Avoid activities in which a seizure would cause danger to yourself or to others.  Don't operate dangerous machinery, swim alone, or climb in high or dangerous places, such as on ladders, roofs, or girders.  Do not drive unless your doctor says you may.  3. If you have any warning that you may have a seizure, lay down in a safe place where you can't hurt yourself.    4.  No driving for 6 months from last seizure, as per [place] state law.   Please refer to the following link on the Epilepsy Foundation of America's website for more information: http://www.epilepsyfoundation.org/answerplace/Social/driving/drivingu.cfm   5.  Maintain good sleep hygiene. Avoid alcohol.  6.  Contact your doctor if you have any problems that may be related to the medicine you are taking.  7.  Call 911 and bring the patient back to the ED if:        A.  The seizure lasts longer than 5 minutes.       B.  The patient doesn't awaken shortly after the seizure  C.  The patient has new problems such as difficulty seeing, speaking or moving  D.  The patient was injured during the seizure  E.  The patient has a temperature over 102 F (39C)  F.  The patient vomited and now is having trouble breathing         

## 2021-03-23 DIAGNOSIS — F4312 Post-traumatic stress disorder, chronic: Secondary | ICD-10-CM | POA: Diagnosis not present

## 2021-03-23 DIAGNOSIS — F840 Autistic disorder: Secondary | ICD-10-CM | POA: Diagnosis not present

## 2021-03-25 DIAGNOSIS — F4312 Post-traumatic stress disorder, chronic: Secondary | ICD-10-CM | POA: Diagnosis not present

## 2021-03-25 DIAGNOSIS — F840 Autistic disorder: Secondary | ICD-10-CM | POA: Diagnosis not present

## 2021-03-26 DIAGNOSIS — F840 Autistic disorder: Secondary | ICD-10-CM | POA: Diagnosis not present

## 2021-03-26 DIAGNOSIS — F4312 Post-traumatic stress disorder, chronic: Secondary | ICD-10-CM | POA: Diagnosis not present

## 2021-03-27 DIAGNOSIS — F840 Autistic disorder: Secondary | ICD-10-CM | POA: Diagnosis not present

## 2021-03-27 DIAGNOSIS — F4312 Post-traumatic stress disorder, chronic: Secondary | ICD-10-CM | POA: Diagnosis not present

## 2021-04-02 DIAGNOSIS — F4312 Post-traumatic stress disorder, chronic: Secondary | ICD-10-CM | POA: Diagnosis not present

## 2021-04-02 DIAGNOSIS — F840 Autistic disorder: Secondary | ICD-10-CM | POA: Diagnosis not present

## 2021-04-04 DIAGNOSIS — F4312 Post-traumatic stress disorder, chronic: Secondary | ICD-10-CM | POA: Diagnosis not present

## 2021-04-04 DIAGNOSIS — F840 Autistic disorder: Secondary | ICD-10-CM | POA: Diagnosis not present

## 2021-04-09 DIAGNOSIS — F840 Autistic disorder: Secondary | ICD-10-CM | POA: Diagnosis not present

## 2021-04-09 DIAGNOSIS — F4312 Post-traumatic stress disorder, chronic: Secondary | ICD-10-CM | POA: Diagnosis not present

## 2021-04-10 DIAGNOSIS — F4312 Post-traumatic stress disorder, chronic: Secondary | ICD-10-CM | POA: Diagnosis not present

## 2021-04-10 DIAGNOSIS — F840 Autistic disorder: Secondary | ICD-10-CM | POA: Diagnosis not present

## 2021-04-11 DIAGNOSIS — F840 Autistic disorder: Secondary | ICD-10-CM | POA: Diagnosis not present

## 2021-04-11 DIAGNOSIS — F4312 Post-traumatic stress disorder, chronic: Secondary | ICD-10-CM | POA: Diagnosis not present

## 2021-04-15 ENCOUNTER — Non-Acute Institutional Stay (HOSPITAL_COMMUNITY)
Admission: RE | Admit: 2021-04-15 | Discharge: 2021-04-15 | Disposition: A | Discharge status: Home / Self Care | Payer: BC Managed Care – PPO | Source: Ambulatory Visit | Attending: Internal Medicine | Admitting: Internal Medicine

## 2021-04-15 ENCOUNTER — Other Ambulatory Visit: Payer: Self-pay

## 2021-04-15 DIAGNOSIS — F840 Autistic disorder: Secondary | ICD-10-CM | POA: Diagnosis not present

## 2021-04-15 DIAGNOSIS — Z452 Encounter for adjustment and management of vascular access device: Secondary | ICD-10-CM | POA: Insufficient documentation

## 2021-04-15 DIAGNOSIS — F4312 Post-traumatic stress disorder, chronic: Secondary | ICD-10-CM | POA: Diagnosis not present

## 2021-04-15 MED ORDER — SODIUM CHLORIDE 0.9% FLUSH
10.0000 mL | INTRAVENOUS | Status: AC | PRN
  Administered 2021-04-15: 10 mL

## 2021-04-15 MED ORDER — HEPARIN SOD (PORK) LOCK FLUSH 100 UNIT/ML IV SOLN
500.0000 [IU] | INTRAVENOUS | Status: AC | PRN
  Administered 2021-04-15: 500 [IU]

## 2021-04-15 NOTE — Progress Notes (Signed)
PATIENT CARE CENTER NOTE   Provider:Dr.Cory Nafziger   Procedure:Port-a-cath flush   Note:Patient's PAC accessed and flushed with 0.9% Sodium Chloride and Heparin. Sterile procedure followed. Patient tolerated procedure well. AVS offered but patient refused. Patient alert, oriented and ambulatory at discharge.           

## 2021-04-16 DIAGNOSIS — F4312 Post-traumatic stress disorder, chronic: Secondary | ICD-10-CM | POA: Diagnosis not present

## 2021-04-16 DIAGNOSIS — F840 Autistic disorder: Secondary | ICD-10-CM | POA: Diagnosis not present

## 2021-04-18 DIAGNOSIS — F4312 Post-traumatic stress disorder, chronic: Secondary | ICD-10-CM | POA: Diagnosis not present

## 2021-04-18 DIAGNOSIS — F840 Autistic disorder: Secondary | ICD-10-CM | POA: Diagnosis not present

## 2021-04-23 DIAGNOSIS — F4312 Post-traumatic stress disorder, chronic: Secondary | ICD-10-CM | POA: Diagnosis not present

## 2021-04-23 DIAGNOSIS — F840 Autistic disorder: Secondary | ICD-10-CM | POA: Diagnosis not present

## 2021-04-24 DIAGNOSIS — F4312 Post-traumatic stress disorder, chronic: Secondary | ICD-10-CM | POA: Diagnosis not present

## 2021-04-24 DIAGNOSIS — F840 Autistic disorder: Secondary | ICD-10-CM | POA: Diagnosis not present

## 2021-04-25 DIAGNOSIS — F4312 Post-traumatic stress disorder, chronic: Secondary | ICD-10-CM | POA: Diagnosis not present

## 2021-04-25 DIAGNOSIS — F840 Autistic disorder: Secondary | ICD-10-CM | POA: Diagnosis not present

## 2021-04-30 DIAGNOSIS — F840 Autistic disorder: Secondary | ICD-10-CM | POA: Diagnosis not present

## 2021-04-30 DIAGNOSIS — F4312 Post-traumatic stress disorder, chronic: Secondary | ICD-10-CM | POA: Diagnosis not present

## 2021-05-02 DIAGNOSIS — F840 Autistic disorder: Secondary | ICD-10-CM | POA: Diagnosis not present

## 2021-05-02 DIAGNOSIS — F4312 Post-traumatic stress disorder, chronic: Secondary | ICD-10-CM | POA: Diagnosis not present

## 2021-05-06 DIAGNOSIS — F4312 Post-traumatic stress disorder, chronic: Secondary | ICD-10-CM | POA: Diagnosis not present

## 2021-05-06 DIAGNOSIS — F840 Autistic disorder: Secondary | ICD-10-CM | POA: Diagnosis not present

## 2021-05-07 DIAGNOSIS — F840 Autistic disorder: Secondary | ICD-10-CM | POA: Diagnosis not present

## 2021-05-07 DIAGNOSIS — F4312 Post-traumatic stress disorder, chronic: Secondary | ICD-10-CM | POA: Diagnosis not present

## 2021-05-09 DIAGNOSIS — F840 Autistic disorder: Secondary | ICD-10-CM | POA: Diagnosis not present

## 2021-05-09 DIAGNOSIS — F4312 Post-traumatic stress disorder, chronic: Secondary | ICD-10-CM | POA: Diagnosis not present

## 2021-05-14 DIAGNOSIS — F4312 Post-traumatic stress disorder, chronic: Secondary | ICD-10-CM | POA: Diagnosis not present

## 2021-05-14 DIAGNOSIS — F840 Autistic disorder: Secondary | ICD-10-CM | POA: Diagnosis not present

## 2021-05-16 DIAGNOSIS — F4312 Post-traumatic stress disorder, chronic: Secondary | ICD-10-CM | POA: Diagnosis not present

## 2021-05-16 DIAGNOSIS — F840 Autistic disorder: Secondary | ICD-10-CM | POA: Diagnosis not present

## 2021-05-19 DIAGNOSIS — F4312 Post-traumatic stress disorder, chronic: Secondary | ICD-10-CM | POA: Diagnosis not present

## 2021-05-19 DIAGNOSIS — F840 Autistic disorder: Secondary | ICD-10-CM | POA: Diagnosis not present

## 2021-05-21 ENCOUNTER — Other Ambulatory Visit: Payer: Self-pay | Admitting: Adult Health

## 2021-05-21 DIAGNOSIS — F840 Autistic disorder: Secondary | ICD-10-CM | POA: Diagnosis not present

## 2021-05-21 DIAGNOSIS — F4312 Post-traumatic stress disorder, chronic: Secondary | ICD-10-CM | POA: Diagnosis not present

## 2021-05-22 ENCOUNTER — Non-Acute Institutional Stay (HOSPITAL_COMMUNITY)
Admission: RE | Admit: 2021-05-22 | Discharge: 2021-05-22 | Disposition: A | Discharge status: Home / Self Care | Payer: BC Managed Care – PPO | Source: Ambulatory Visit | Attending: Internal Medicine | Admitting: Internal Medicine

## 2021-05-22 ENCOUNTER — Encounter (HOSPITAL_COMMUNITY): Payer: BC Managed Care – PPO

## 2021-05-22 ENCOUNTER — Other Ambulatory Visit: Payer: Self-pay

## 2021-05-22 DIAGNOSIS — Z452 Encounter for adjustment and management of vascular access device: Secondary | ICD-10-CM | POA: Diagnosis not present

## 2021-05-22 DIAGNOSIS — F4312 Post-traumatic stress disorder, chronic: Secondary | ICD-10-CM | POA: Diagnosis not present

## 2021-05-22 DIAGNOSIS — Z538 Procedure and treatment not carried out for other reasons: Secondary | ICD-10-CM | POA: Diagnosis not present

## 2021-05-22 DIAGNOSIS — F840 Autistic disorder: Secondary | ICD-10-CM | POA: Diagnosis not present

## 2021-05-22 MED ORDER — HEPARIN SOD (PORK) LOCK FLUSH 100 UNIT/ML IV SOLN
500.0000 [IU] | INTRAVENOUS | Status: AC | PRN
  Administered 2021-05-22: 500 [IU]

## 2021-05-22 MED ORDER — SODIUM CHLORIDE 0.9% FLUSH
10.0000 mL | INTRAVENOUS | Status: AC | PRN
  Administered 2021-05-22: 10 mL

## 2021-05-22 NOTE — Progress Notes (Signed)
RN was unable to access implanted port. IV team contacted, per IV team they can get to the patient in 30 minutes, instead the patient along with her companion decided they cannot wait instead they will reschedule. Patient rescheduled for 05/23/2021.

## 2021-05-22 NOTE — Progress Notes (Signed)
Talked [facility] RN regarding VAST consult. Informed RN to be there within 30 min, but patient refused to stay until that time. HS [employer]

## 2021-05-23 ENCOUNTER — Non-Acute Institutional Stay (HOSPITAL_COMMUNITY)
Admission: RE | Admit: 2021-05-23 | Discharge: 2021-05-23 | Disposition: A | Discharge status: Home / Self Care | Payer: BC Managed Care – PPO | Source: Ambulatory Visit | Attending: Internal Medicine | Admitting: Internal Medicine

## 2021-05-23 DIAGNOSIS — Z452 Encounter for adjustment and management of vascular access device: Secondary | ICD-10-CM | POA: Diagnosis not present

## 2021-05-23 DIAGNOSIS — F840 Autistic disorder: Secondary | ICD-10-CM | POA: Diagnosis not present

## 2021-05-23 DIAGNOSIS — F4312 Post-traumatic stress disorder, chronic: Secondary | ICD-10-CM | POA: Diagnosis not present

## 2021-05-23 MED ORDER — SODIUM CHLORIDE 0.9% FLUSH
10.0000 mL | INTRAVENOUS | Status: AC | PRN
  Administered 2021-05-23: 10 mL

## 2021-05-23 MED ORDER — HEPARIN SOD (PORK) LOCK FLUSH 100 UNIT/ML IV SOLN
500.0000 [IU] | INTRAVENOUS | Status: AC | PRN
  Administered 2021-05-23: 500 [IU]

## 2021-05-23 NOTE — Progress Notes (Signed)
PATIENT CARE CENTER NOTE     Provider: Dr. Dorothyann Peng     Procedure: Port-a-cath flush     Note: Patient's PAC accessed and flushed with 0.9% Sodium Chloride and Heparin. Sterile procedure followed. Patient tolerated procedure well. AVS offered but patient refused. Patient alert, oriented and ambulatory at discharge. Support person with patient at discharge.

## 2021-05-24 DIAGNOSIS — F4312 Post-traumatic stress disorder, chronic: Secondary | ICD-10-CM | POA: Diagnosis not present

## 2021-05-24 DIAGNOSIS — F840 Autistic disorder: Secondary | ICD-10-CM | POA: Diagnosis not present

## 2021-05-28 DIAGNOSIS — F840 Autistic disorder: Secondary | ICD-10-CM | POA: Diagnosis not present

## 2021-05-28 DIAGNOSIS — F4312 Post-traumatic stress disorder, chronic: Secondary | ICD-10-CM | POA: Diagnosis not present

## 2021-05-30 DIAGNOSIS — F840 Autistic disorder: Secondary | ICD-10-CM | POA: Diagnosis not present

## 2021-05-30 DIAGNOSIS — F4312 Post-traumatic stress disorder, chronic: Secondary | ICD-10-CM | POA: Diagnosis not present

## 2021-06-02 DIAGNOSIS — F4312 Post-traumatic stress disorder, chronic: Secondary | ICD-10-CM | POA: Diagnosis not present

## 2021-06-02 DIAGNOSIS — F840 Autistic disorder: Secondary | ICD-10-CM | POA: Diagnosis not present

## 2021-06-04 DIAGNOSIS — F4312 Post-traumatic stress disorder, chronic: Secondary | ICD-10-CM | POA: Diagnosis not present

## 2021-06-04 DIAGNOSIS — F840 Autistic disorder: Secondary | ICD-10-CM | POA: Diagnosis not present

## 2021-06-05 DIAGNOSIS — T8141XA Infection following a procedure, superficial incisional surgical site, initial encounter: Secondary | ICD-10-CM | POA: Diagnosis not present

## 2021-06-05 DIAGNOSIS — F4312 Post-traumatic stress disorder, chronic: Secondary | ICD-10-CM | POA: Diagnosis not present

## 2021-06-05 DIAGNOSIS — F840 Autistic disorder: Secondary | ICD-10-CM | POA: Diagnosis not present

## 2021-06-06 ENCOUNTER — Inpatient Hospital Stay (HOSPITAL_COMMUNITY): Admission: RE | Admit: 2021-06-06 | Payer: BC Managed Care – PPO | Source: Ambulatory Visit

## 2021-06-06 DIAGNOSIS — T8141XA Infection following a procedure, superficial incisional surgical site, initial encounter: Secondary | ICD-10-CM | POA: Diagnosis not present

## 2021-06-06 DIAGNOSIS — F4312 Post-traumatic stress disorder, chronic: Secondary | ICD-10-CM | POA: Diagnosis not present

## 2021-06-06 DIAGNOSIS — F840 Autistic disorder: Secondary | ICD-10-CM | POA: Diagnosis not present

## 2021-06-07 DIAGNOSIS — F4312 Post-traumatic stress disorder, chronic: Secondary | ICD-10-CM | POA: Diagnosis not present

## 2021-06-07 DIAGNOSIS — F840 Autistic disorder: Secondary | ICD-10-CM | POA: Diagnosis not present

## 2021-06-12 DIAGNOSIS — F4312 Post-traumatic stress disorder, chronic: Secondary | ICD-10-CM | POA: Diagnosis not present

## 2021-06-12 DIAGNOSIS — F840 Autistic disorder: Secondary | ICD-10-CM | POA: Diagnosis not present

## 2021-06-13 DIAGNOSIS — F840 Autistic disorder: Secondary | ICD-10-CM | POA: Diagnosis not present

## 2021-06-13 DIAGNOSIS — F4312 Post-traumatic stress disorder, chronic: Secondary | ICD-10-CM | POA: Diagnosis not present

## 2021-06-19 ENCOUNTER — Encounter: Payer: Self-pay | Admitting: Adult Health

## 2021-06-19 ENCOUNTER — Non-Acute Institutional Stay (HOSPITAL_COMMUNITY)
Admission: RE | Admit: 2021-06-19 | Discharge: 2021-06-19 | Disposition: A | Discharge status: Home / Self Care | Payer: BC Managed Care – PPO | Source: Ambulatory Visit | Attending: Internal Medicine | Admitting: Internal Medicine

## 2021-06-19 ENCOUNTER — Other Ambulatory Visit: Payer: Self-pay

## 2021-06-19 DIAGNOSIS — T368X5A Adverse effect of other systemic antibiotics, initial encounter: Secondary | ICD-10-CM | POA: Diagnosis not present

## 2021-06-19 DIAGNOSIS — L270 Generalized skin eruption due to drugs and medicaments taken internally: Secondary | ICD-10-CM | POA: Insufficient documentation

## 2021-06-19 DIAGNOSIS — X58XXXA Exposure to other specified factors, initial encounter: Secondary | ICD-10-CM | POA: Insufficient documentation

## 2021-06-19 DIAGNOSIS — F4312 Post-traumatic stress disorder, chronic: Secondary | ICD-10-CM | POA: Diagnosis not present

## 2021-06-19 DIAGNOSIS — F840 Autistic disorder: Secondary | ICD-10-CM | POA: Diagnosis not present

## 2021-06-19 DIAGNOSIS — T8141XA Infection following a procedure, superficial incisional surgical site, initial encounter: Secondary | ICD-10-CM | POA: Diagnosis not present

## 2021-06-19 MED ORDER — SODIUM CHLORIDE 0.9 % IV SOLN
25.0000 mg | Freq: Once | INTRAVENOUS | Status: DC
  Filled 2021-06-19: qty 1

## 2021-06-19 MED ORDER — SODIUM CHLORIDE 0.9% FLUSH
10.0000 mL | INTRAVENOUS | Status: AC | PRN
  Administered 2021-06-19: 10 mL

## 2021-06-19 MED ORDER — VANCOMYCIN HCL 1000 MG/200ML IV SOLN
1000.0000 mg | Freq: Once | INTRAVENOUS | Status: AC
  Administered 2021-06-19: 1000 mg via INTRAVENOUS
  Filled 2021-06-19: qty 200

## 2021-06-19 MED ORDER — SODIUM CHLORIDE 0.9 % IV SOLN
INTRAVENOUS | Status: DC | PRN
  Administered 2021-06-19: 250 mL via INTRAVENOUS

## 2021-06-19 MED ORDER — HEPARIN SOD (PORK) LOCK FLUSH 100 UNIT/ML IV SOLN
500.0000 [IU] | INTRAVENOUS | Status: AC | PRN
  Administered 2021-06-19: 500 [IU]
  Filled 2021-06-19: qty 5

## 2021-06-19 MED ORDER — METHYLPREDNISOLONE SODIUM SUCC 125 MG IJ SOLR
125.0000 mg | Freq: Once | INTRAMUSCULAR | Status: AC
  Administered 2021-06-19: 125 mg via INTRAVENOUS

## 2021-06-19 NOTE — Progress Notes (Signed)
PATIENT CARE CENTER NOTE  Diagnosis: Superficial incisional surgical site infection    Provider: Carlynn Purl, MD   Procedure: Vancomycin infusion (Patient only received half due to reaction)   Note:  Patient's port accessed and Vancomycin infusion started. Around 30 minutes into infusion, patient developed a generalized red rash on arms, chest and back. Patient also reported dizziness and generalized itching.  Infusion stopped and the on-site MD (Dr. Doreene Burke) was notified and came to assess patient. Dr. Doreene Burke gave orders for IV Solu-medrol and Phenergan.  Vital signs taken and stable.  IV Solu-medrol given and rash started to clear. Patient declined Phenergan.  Patient monitored for another 45 minutes and Dr. Doreene Burke reassessed patient. Dr. Ivor Costa office called and Janett Billow, RN notified of patient's reaction to Vancomycin.  Per Janett Billow, the doctor on call [facility]) would be notified and the office would call the patient and advise her on the next steps to take concerning antibiotic infusion.  Patient advised that Dr. Ivor Costa office would be contacting her.  Patient's vitals signs remained stable. Port-a-cath flushed and deaccessed. Patient alert, oriented and ambulatory at discharge.

## 2021-06-20 DIAGNOSIS — F4312 Post-traumatic stress disorder, chronic: Secondary | ICD-10-CM | POA: Diagnosis not present

## 2021-06-20 DIAGNOSIS — F840 Autistic disorder: Secondary | ICD-10-CM | POA: Diagnosis not present

## 2021-06-25 DIAGNOSIS — F840 Autistic disorder: Secondary | ICD-10-CM | POA: Diagnosis not present

## 2021-06-25 DIAGNOSIS — F4312 Post-traumatic stress disorder, chronic: Secondary | ICD-10-CM | POA: Diagnosis not present

## 2021-06-27 DIAGNOSIS — F4312 Post-traumatic stress disorder, chronic: Secondary | ICD-10-CM | POA: Diagnosis not present

## 2021-06-27 DIAGNOSIS — F840 Autistic disorder: Secondary | ICD-10-CM | POA: Diagnosis not present

## 2021-07-02 DIAGNOSIS — F840 Autistic disorder: Secondary | ICD-10-CM | POA: Diagnosis not present

## 2021-07-02 DIAGNOSIS — F4312 Post-traumatic stress disorder, chronic: Secondary | ICD-10-CM | POA: Diagnosis not present

## 2021-07-03 ENCOUNTER — Other Ambulatory Visit: Payer: Self-pay

## 2021-07-03 ENCOUNTER — Ambulatory Visit (INDEPENDENT_AMBULATORY_CARE_PROVIDER_SITE_OTHER): Payer: BC Managed Care – PPO | Admitting: Adult Health

## 2021-07-03 ENCOUNTER — Encounter: Payer: Self-pay | Admitting: Adult Health

## 2021-07-03 VITALS — BP 128/82 | HR 56 | Temp 98.6°F | Ht 64.0 in

## 2021-07-03 DIAGNOSIS — Z95828 Presence of other vascular implants and grafts: Secondary | ICD-10-CM | POA: Diagnosis not present

## 2021-07-03 DIAGNOSIS — G40309 Generalized idiopathic epilepsy and epileptic syndromes, not intractable, without status epilepticus: Secondary | ICD-10-CM

## 2021-07-03 DIAGNOSIS — Z9889 Other specified postprocedural states: Secondary | ICD-10-CM | POA: Diagnosis not present

## 2021-07-03 DIAGNOSIS — E8842 MERRF syndrome: Secondary | ICD-10-CM

## 2021-07-03 MED ORDER — DEXCOM G6 TRANSMITTER MISC: 3 refills | Status: AC

## 2021-07-03 MED ORDER — DEXCOM G6 SENSOR MISC: 12 refills | Status: DC

## 2021-07-03 NOTE — Patient Instructions (Signed)
Could possibly try Gentamycin or possibly a silver based cream ?
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
## 2021-07-04 DIAGNOSIS — F840 Autistic disorder: Secondary | ICD-10-CM | POA: Diagnosis not present

## 2021-07-05 ENCOUNTER — Telehealth: Payer: Self-pay | Admitting: Adult Health

## 2021-07-05 ENCOUNTER — Other Ambulatory Visit: Payer: Self-pay | Admitting: Adult Health

## 2021-07-05 DIAGNOSIS — E8842 MERRF syndrome: Secondary | ICD-10-CM

## 2021-07-05 NOTE — Telephone Encounter (Signed)
The patients mother called to see if [name] can give the patient another Dexcom device until the insurance approved the device for the patient.  The patient seen [name] on 07/20 and he gave her a Dexcom device to try and she went horse back riding and it fell off. She doesn't know if it is because of the weather or what happened.  Please advise

## 2021-07-08 NOTE — Telephone Encounter (Signed)
Please advise 

## 2021-07-09 DIAGNOSIS — L700 Acne vulgaris: Secondary | ICD-10-CM | POA: Diagnosis not present

## 2021-07-09 NOTE — Telephone Encounter (Signed)
Pt picked up Dexcom.

## 2021-07-09 NOTE — Telephone Encounter (Signed)
Pt will come by to pick up today

## 2021-07-13 DIAGNOSIS — F840 Autistic disorder: Secondary | ICD-10-CM | POA: Diagnosis not present

## 2021-07-13 DIAGNOSIS — F4312 Post-traumatic stress disorder, chronic: Secondary | ICD-10-CM | POA: Diagnosis not present

## 2021-07-15 ENCOUNTER — Ambulatory Visit: Payer: BC Managed Care – PPO | Admitting: Neurology

## 2021-07-16 DIAGNOSIS — F4312 Post-traumatic stress disorder, chronic: Secondary | ICD-10-CM | POA: Diagnosis not present

## 2021-07-16 DIAGNOSIS — F840 Autistic disorder: Secondary | ICD-10-CM | POA: Diagnosis not present

## 2021-07-17 ENCOUNTER — Encounter: Payer: Self-pay | Admitting: Adult Health

## 2021-07-17 DIAGNOSIS — N898 Other specified noninflammatory disorders of vagina: Secondary | ICD-10-CM | POA: Diagnosis not present

## 2021-07-17 DIAGNOSIS — N939 Abnormal uterine and vaginal bleeding, unspecified: Secondary | ICD-10-CM | POA: Diagnosis not present

## 2021-07-17 DIAGNOSIS — F840 Autistic disorder: Secondary | ICD-10-CM | POA: Diagnosis not present

## 2021-07-17 DIAGNOSIS — F4312 Post-traumatic stress disorder, chronic: Secondary | ICD-10-CM | POA: Diagnosis not present

## 2021-07-19 DIAGNOSIS — F840 Autistic disorder: Secondary | ICD-10-CM | POA: Diagnosis not present

## 2021-07-23 ENCOUNTER — Telehealth: Payer: Self-pay | Admitting: Neurology

## 2021-07-23 DIAGNOSIS — F840 Autistic disorder: Secondary | ICD-10-CM | POA: Diagnosis not present

## 2021-07-23 NOTE — Telephone Encounter (Signed)
Pt called in and let me know she had sent a mychart message to Dr. Delice Lesch and sometimes it can take doctors a few weeks to answer those and wants to make sure Dr. Delice Lesch see's it. She doesn't want to wait that long for an answer.

## 2021-07-24 ENCOUNTER — Emergency Department (HOSPITAL_COMMUNITY)
Admission: EM | Admit: 2021-07-24 | Discharge: 2021-07-24 | Disposition: A | Discharge status: Home / Self Care | Payer: BC Managed Care – PPO | Attending: Emergency Medicine | Admitting: Emergency Medicine

## 2021-07-24 ENCOUNTER — Emergency Department (HOSPITAL_COMMUNITY): Payer: BC Managed Care – PPO

## 2021-07-24 ENCOUNTER — Encounter (HOSPITAL_COMMUNITY): Payer: Self-pay | Admitting: Emergency Medicine

## 2021-07-24 ENCOUNTER — Other Ambulatory Visit: Payer: Self-pay

## 2021-07-24 DIAGNOSIS — S30810A Abrasion of lower back and pelvis, initial encounter: Secondary | ICD-10-CM | POA: Diagnosis not present

## 2021-07-24 DIAGNOSIS — G40909 Epilepsy, unspecified, not intractable, without status epilepticus: Secondary | ICD-10-CM | POA: Diagnosis not present

## 2021-07-24 DIAGNOSIS — S0990XA Unspecified injury of head, initial encounter: Secondary | ICD-10-CM | POA: Insufficient documentation

## 2021-07-24 DIAGNOSIS — R569 Unspecified convulsions: Secondary | ICD-10-CM | POA: Diagnosis not present

## 2021-07-24 DIAGNOSIS — S199XXA Unspecified injury of neck, initial encounter: Secondary | ICD-10-CM | POA: Diagnosis not present

## 2021-07-24 DIAGNOSIS — M25552 Pain in left hip: Secondary | ICD-10-CM | POA: Diagnosis not present

## 2021-07-24 DIAGNOSIS — J45909 Unspecified asthma, uncomplicated: Secondary | ICD-10-CM | POA: Diagnosis not present

## 2021-07-24 DIAGNOSIS — Z91040 Latex allergy status: Secondary | ICD-10-CM | POA: Insufficient documentation

## 2021-07-24 DIAGNOSIS — R519 Headache, unspecified: Secondary | ICD-10-CM | POA: Diagnosis not present

## 2021-07-24 DIAGNOSIS — M25559 Pain in unspecified hip: Secondary | ICD-10-CM | POA: Diagnosis not present

## 2021-07-24 LAB — CBG MONITORING, ED: Glucose-Capillary: 100 mg/dL — ABNORMAL HIGH (ref 70–99)

## 2021-07-24 MED ORDER — ACETAMINOPHEN 325 MG PO TABS
650.0000 mg | ORAL_TABLET | Freq: Once | ORAL | Status: AC
  Administered 2021-07-24: 650 mg via ORAL
  Filled 2021-07-24: qty 2

## 2021-07-24 NOTE — ED Notes (Signed)
Pt taken to CT at this time.

## 2021-07-24 NOTE — ED Triage Notes (Addendum)
Pt BIBA from horseback riding with c/o seizures and a fall. EMS reports pt had two witnessed seizures with LOC and fell from the horse. EMS reports pt did hit her head and also c/o neck pain. Hx of MERFF disease so she has seizures weekly. Pt currently post ictal and A&O x3  124/76 CBG 112 RR 16 O2 -99% room air

## 2021-07-25 ENCOUNTER — Other Ambulatory Visit: Payer: Self-pay

## 2021-07-25 DIAGNOSIS — F840 Autistic disorder: Secondary | ICD-10-CM | POA: Diagnosis not present

## 2021-07-25 NOTE — ED Provider Notes (Signed)
[name] DEPT Provider Note   CSN: [id number] Arrival date & time: 07/24/21  1036     History Chief Complaint  Patient presents with   Seizures   Fall    [name] is a 30 y.o. female.  Patient is a 30 yo female presenting for fall. Patient admits to fall from horse with blunt head trauma, while wearing a helmet, that occurred directly prior to arrival. Patient states once she fell and hit her head she had a seizure. Fall and seizure was witnessed. Pt admits to hx of seizures that have been present for over 12 years, follows with neurologist regularly, compliant with Keppra, with break through seizures once a week. Pt states "once a week is good for me". Patient admits to headache and left sided buttocks pain. Otherwise denies any injuries or neuro deficits.   The history is provided by the patient. No language interpreter was used.  Seizures Fall This is a new problem. The current episode started less than 1 hour ago. The problem has been rapidly improving. Associated symptoms include headaches. Pertinent negatives include no chest pain, no abdominal pain and no shortness of breath. Nothing relieves the symptoms.      Past Medical History:  Diagnosis Date   Acne    Anemia    hx of   Anxiety    Asthma    Carpal tunnel syndrome    right   Complex partial seizures ([place])    last seizure 08-17-4267   Complication of anesthesia    slow to wake up, disoriented, hx of  mild seizure after surgery -2019 gallbladder    Coordination problem    Cyst of brain    states is collection of scar tissue - was kicked by a horse as a teenager   Difficult intravenous access    RIGHT CHEST PAC   Gallstones    GERD (gastroesophageal reflux disease)    Head injury, intracranial, with concussion    Hearing loss    both ears   Heart murmur    states no known problems, no cardiologist   History of asthma    no current med.   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)    Migraines    Ovarian cyst    Right   Pneumonia 12/2018   hx of    PONV (postoperative nausea and vomiting)    PTSD (post-traumatic stress disorder)    PTSD (post-traumatic stress disorder)    Shoulder dislocation 09/2018   Left   Shoulder subluxation, left    Syncope    Tremor, unspecified    bilateral arms   Wears glasses    Wears hearing aid    bilateral    Patient Active Problem List   Diagnosis Date Noted   Dysmenorrhea 03/12/2021   Post-operative state 03/12/2021   Problem with vascular access 02/16/2020   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   Pneumomediastinum ([place]) 03/17/2019   Seizure ([place]) 10/05/2018   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   Syncope 05/21/2018   Seizures ([place]) 12/04/2017   Cellulitis of chest wall 10/07/2017   Infected venous access port, initial encounter 10/07/2017   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   Partial idiopathic epilepsy with seizures of localized onset, intractable, without status epilepticus ([place]) 07/12/2015   Hypoglycemia 06/12/2014  Migraine headache 06/29/2013   Asthma 11/27/2011   Muscle weakness of lower extremity 11/26/2011   Acute appendicitis with localized peritonitis 11/26/2011   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   Neuropathy, 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   Seizure disorder ([place]) 08/16/2009   HEARING LOSS, BILATERAL 05/25/2008    Past Surgical History:  Procedure Laterality Date   ABDOMINAL HYSTERECTOMY     CHOLECYSTECTOMY N/A 10/05/2018   Procedure: LAPAROSCOPIC CHOLECYSTECTOMY WITH INTRAOPERATIVE CHOLANGIOGRAM ERAS PATHWAY;  Surgeon: Alphonsa Overall, MD;  Location: [facility];  Service: General;  Laterality: N/A;   CYSTOSCOPY N/A 03/12/2021   Procedure: CYSTOSCOPY;  Surgeon: Sherlyn Hay, DO;  Location: [place];  Service: Gynecology;  Laterality: N/A;   LAPAROSCOPIC  APPENDECTOMY  11/26/2011   Procedure: APPENDECTOMY LAPAROSCOPIC;  Surgeon: Gayland Curry, MD;  Location: [place];  Service: General;  Laterality: N/A;   left shoulder manipulation     in er multilple times last done july 2021   PORTACATH PLACEMENT Right 08/28/2017   Procedure: POWER PORT PLACEMENT;  Surgeon: Alphonsa Overall, MD;  Location: [place];  Service: General;  Laterality: Right;   PORTACATH PLACEMENT N/A 12/04/2017   Procedure: INSERTION PORT-A-CATH;  Surgeon: Alphonsa Overall, MD;  Location: [place];  Service: General;  Laterality: N/A;   RADIOLOGY WITH ANESTHESIA Left 10/25/2019   Procedure: MRI  LEFT SHOLDER WITHOUT CONTRAST;  Surgeon: Radiologist, Medication, MD;  Location: [place];  Service: Radiology;  Laterality: Left;   REPAIR ANKLE LIGAMENT Left    x 3   TOTAL LAPAROSCOPIC HYSTERECTOMY WITH SALPINGECTOMY Bilateral 03/12/2021   Procedure: TOTAL LAPAROSCOPIC HYSTERECTOMY WITH SALPINGECTOMY;  Surgeon: Sherlyn Hay, DO;  Location: [place];  Service: Gynecology;  Laterality: Bilateral;     OB History     Gravida  0   Para  0   Term  0   Preterm  0   AB  0   Living  0      SAB  0   IAB  0   Ectopic  0   Multiple  0   Live Births  0           Family History  Problem Relation Age of Onset   Polymyalgia rheumatica Mother    Heart disease Mother    Sudden Cardiac Death Father        long QT syndrome    Heart disease Maternal Grandmother    Breast cancer Maternal Grandmother    Heart disease Maternal Grandfather    Stroke Maternal Grandfather    Diabetes Maternal Grandfather    COPD Maternal Grandfather    Kidney failure Maternal Grandfather    Heart disease Paternal Grandmother    Heart disease Paternal Grandfather    Migraines Paternal Grandfather     Social History   Tobacco Use   Smoking status: Never   Smokeless tobacco: Never  Vaping Use   Vaping Use: Never used  Substance Use Topics   Alcohol use: Never   Drug use:  Never    Home Medications Prior to Admission medications   Medication Sig Start Date End Date Taking? Authorizing Provider  acetaminophen (TYLENOL) 500 MG tablet Take 1,000 mg by mouth 3 (three) times daily as needed for moderate pain or headache.    Yes [provider]  Adapalene 0.3 % gel Apply 1 application topically 2 (two) times daily.   Yes [provider]  albuterol (PROVENTIL HFA;VENTOLIN HFA) 108 (90 Base) MCG/ACT inhaler Inhale 2 puffs into  the lungs every 6 (six) hours as needed for wheezing or shortness of breath.    Yes [provider]  Dapsone 5 % topical gel Apply 1 application topically daily.  03/07/20  Yes [provider]  diphenhydrAMINE (BENADRYL) 25 MG tablet Take 25 mg by mouth at bedtime.   Yes [provider]  Glucagon, rDNA, (GLUCAGON EMERGENCY) 1 MG KIT Use PRN for hypoglycemia 10/09/20  Yes Nafziger, [name], NP  Lacosamide (VIMPAT) 100 MG TABS Take 1 tablet (100 mg total) by mouth 2 (two) times daily. 03/22/21  Yes Cameron Sprang, MD  levETIRAcetam (KEPPRA) 500 MG tablet Take 1 tablet three times a day 03/22/21  Yes Cameron Sprang, MD  LORazepam (ATIVAN) 0.5 MG tablet Take 1 tablet as needed for seizure. Patient taking differently: Take 0.5 mg by mouth as needed for seizure. 03/22/21  Yes Cameron Sprang, MD  Perampanel [facility]) 4 MG TABS Take 2 tablets (8 mg total) by mouth every evening. 03/22/21  Yes Cameron Sprang, MD  spironolactone (ALDACTONE) 50 MG tablet Take 50 mg by mouth every evening. 10/10/19  Yes [provider]  clindamycin (CLEOCIN T) 1 % lotion Apply 1 application topically See admin instructions. Apply to affected areas of the face 2 times a day 08/29/18   [provider]  Continuous Blood Gluc Sensor (DEXCOM G6 SENSOR) MISC USE TO MONITOR BLOOD SUGARS 07/05/21   Nafziger, [name], NP  Continuous Blood Gluc Transmit (DEXCOM G6 TRANSMITTER) MISC Use with dexcom receiver and sensor 07/03/21   Nafziger,  [name], NP  glucose monitoring kit (FREESTYLE) monitoring kit 1 each by Does not apply route as needed for other. Free style libre on right arm    [provider]  ibuprofen (ADVIL) 600 MG tablet Take 1 tablet (600 mg total) by mouth every 6 (six) hours as needed. Patient taking differently: Take 600 mg by mouth every 6 (six) hours as needed for fever, headache or mild pain. 03/12/21   Sherlyn Hay, DO  nortriptyline (PAMELOR) 10 MG capsule Take 1 capsule (10 mg total) by mouth at bedtime. Patient taking differently: Take 10 mg by mouth at bedtime as needed (headache). 03/22/21   Cameron Sprang, MD    Allergies    Cefzil [cefprozil], Cephalosporins, Penicillins, Midazolam, Shellfish allergy, Morphine and related, Latex, Tegaderm ag mesh [silver], and Vancomycin  Review of Systems   Review of Systems  Constitutional:  Negative for chills and fever.  HENT:  Negative for ear pain and sore throat.   Eyes:  Negative for pain and visual disturbance.  Respiratory:  Negative for cough and shortness of breath.   Cardiovascular:  Negative for chest pain and palpitations.  Gastrointestinal:  Negative for abdominal pain and vomiting.  Genitourinary:  Negative for dysuria and hematuria.  Musculoskeletal:  Negative for arthralgias and back pain.  Skin:  Negative for color change and rash.  Neurological:  Positive for seizures and headaches. Negative for syncope.  All other systems reviewed and are negative.  Physical Exam Updated Vital Signs BP 123/82   Pulse 65   Temp 98 F (36.7 C) (Oral)   Resp 17   Ht 5' 4.5" (1.638 m)   Wt 47.6 kg   LMP 02/11/2021   SpO2 98%   BMI 17.74 kg/m   Physical Exam Vitals and nursing note reviewed.  Constitutional:      General: She is not in acute distress.    Appearance: She is well-developed.  HENT:  Head: Normocephalic and atraumatic.  Eyes:     Conjunctiva/sclera: Conjunctivae normal.  Cardiovascular:     Rate and Rhythm: Normal  rate and regular rhythm.     Heart sounds: No murmur heard. Pulmonary:     Effort: Pulmonary effort is normal. No respiratory distress.     Breath sounds: Normal breath sounds.  Abdominal:     Palpations: Abdomen is soft.     Tenderness: There is no abdominal tenderness.  Musculoskeletal:     Cervical back: Neck supple. No bony tenderness.     Thoracic back: No bony tenderness.     Lumbar back: No bony tenderness.     Right hip: No bony tenderness.     Left hip: No bony tenderness.     Right upper leg: No bony tenderness.     Left upper leg: No bony tenderness.       Legs:  Skin:    General: Skin is warm and dry.  Neurological:     Mental Status: She is alert.     GCS: GCS eye subscore is 4. GCS verbal subscore is 5. GCS motor subscore is 6.     Cranial Nerves: Cranial nerves are intact.     Sensory: Sensation is intact.     Motor: Motor function is intact.     Coordination: Coordination is intact.     Gait: Gait is intact.    ED Results / Procedures / Treatments   Labs (all labs ordered are listed, but only abnormal results are displayed) Labs Reviewed  CBG MONITORING, ED - Abnormal; Notable for the following components:      Result Value   Glucose-Capillary 100 (*)    All other components within normal limits    EKG None  Radiology DG Pelvis 1-2 Views  Result Date: 07/24/2021 CLINICAL DATA:  fall off horse, left hip pain EXAM: PELVIS - 1-2 VIEW COMPARISON:  None. FINDINGS: There is no evidence of acute fracture. Normal alignment. There are radiodensities overlying the upper left iliac bone, and left hip which may be external debris. IMPRESSION: No acute fracture on single frontal view of the pelvis. Electronically Signed   By: Maurine Simmering   On: 07/24/2021 12:12   CT HEAD WO CONTRAST (5MM)  Result Date: 07/24/2021 CLINICAL DATA:  Neck trauma, midline tenderness (Age 63-64y); Head trauma, abnormal mental status (Age 69-64y) EXAM: CT HEAD WITHOUT CONTRAST CT CERVICAL  SPINE WITHOUT CONTRAST TECHNIQUE: Multidetector CT imaging of the head and cervical spine was performed following the standard protocol without intravenous contrast. Multiplanar CT image reconstructions of the cervical spine were also generated. COMPARISON:  In the 07/09/2020. FINDINGS: CT HEAD FINDINGS Brain: No evidence of acute infarction, hemorrhage, hydrocephalus, extra-axial collection or mass lesion/mass effect. Vascular: No hyperdense vessel identified. Skull: No acute fracture. Sinuses/Orbits: Visualized sinuses are clear. Other: No mastoid effusions. CT CERVICAL SPINE FINDINGS Alignment: Straightening of the normal cervical lordosis without significant sagittal subluxation. Skull base and vertebrae: No evidence of acute fracture. Small corticated fragment inferior to the atlantodental joint is unchanged from the prior. Soft tissues and spinal canal: No prevertebral fluid or swelling. No visible canal hematoma. Disc levels:  No significant degenerative change. Upper chest: Visualized lung apices are clear. IMPRESSION: 1. No evidence of acute intracranial abnormality. 2. No evidence of acute fracture or traumatic malalignment the cervical spine. Electronically Signed   By: Margaretha Sheffield MD   On: 07/24/2021 12:41   CT Cervical Spine Wo Contrast  Result Date: 07/24/2021 CLINICAL  DATA:  Neck trauma, midline tenderness (Age 7-64y); Head trauma, abnormal mental status (Age 32-64y) EXAM: CT HEAD WITHOUT CONTRAST CT CERVICAL SPINE WITHOUT CONTRAST TECHNIQUE: Multidetector CT imaging of the head and cervical spine was performed following the standard protocol without intravenous contrast. Multiplanar CT image reconstructions of the cervical spine were also generated. COMPARISON:  In the 07/09/2020. FINDINGS: CT HEAD FINDINGS Brain: No evidence of acute infarction, hemorrhage, hydrocephalus, extra-axial collection or mass lesion/mass effect. Vascular: No hyperdense vessel identified. Skull: No acute  fracture. Sinuses/Orbits: Visualized sinuses are clear. Other: No mastoid effusions. CT CERVICAL SPINE FINDINGS Alignment: Straightening of the normal cervical lordosis without significant sagittal subluxation. Skull base and vertebrae: No evidence of acute fracture. Small corticated fragment inferior to the atlantodental joint is unchanged from the prior. Soft tissues and spinal canal: No prevertebral fluid or swelling. No visible canal hematoma. Disc levels:  No significant degenerative change. Upper chest: Visualized lung apices are clear. IMPRESSION: 1. No evidence of acute intracranial abnormality. 2. No evidence of acute fracture or traumatic malalignment the cervical spine. Electronically Signed   By: Margaretha Sheffield MD   On: 07/24/2021 12:41   DG Chest Portable 1 View  Result Date: 07/24/2021 CLINICAL DATA:  Fall off horse with hip pain. EXAM: PORTABLE CHEST 1 VIEW COMPARISON:  03/21/2021 CTA chest.  07/05/2020 chest radiograph. FINDINGS: Right Port-A-Cath tip at superior caval/atrial junction. Midline trachea. Normal heart size and mediastinal contours. No pleural effusion or pneumothorax. Clear lungs. No free intraperitoneal air. IMPRESSION: Normal chest. Electronically Signed   By: Abigail Miyamoto M.D.   On: 07/24/2021 12:12    Procedures Procedures   Medications Ordered in ED Medications  acetaminophen (TYLENOL) tablet 650 mg (650 mg Oral Given 07/24/21 1136)    ED Course  I have reviewed the triage vital signs and the nursing notes.  Pertinent labs & imaging results that were available during my care of the patient were reviewed by me and considered in my medical decision making (see chart for details).    MDM Rules/Calculators/A&P                         30 yo female presenting for fall. Patient admits to fall from horse with blunt head trauma, while wearing a helmet, that occurred directly prior to arrival. Pt is Aox3, no acute distress, afebrile, with stable vitals. Physical exam  demonstrates no midline spinal tenderness. No neurovascular deficits.   Patient declining blood work or Keppra levels at this time. Ct head and neck demonstrates no acute process. CXR and Pelvis demonstrates no fractures. No other bony tenderness at this time. Patient is currently back to baseline and requesting discharge with mother at bedside. Pt strongly recommended to return for any repeat seizures, worsening of headache, or development of motor/sensation dysfunction. Recommended to follow closely with neurologist for break through seizures. Concussion return precautions given.  Final Clinical Impression(s) / ED Diagnoses Final diagnoses:  Injury of head, initial encounter  Seizure [facility])    Rx / DC Orders ED Discharge Orders     None        Lianne Cure, DO 16/10/96 1051

## 2021-07-25 NOTE — Telephone Encounter (Signed)
Replied to MyChart message.

## 2021-07-26 ENCOUNTER — Ambulatory Visit: Payer: BC Managed Care – PPO | Admitting: Adult Health

## 2021-07-27 DIAGNOSIS — F4312 Post-traumatic stress disorder, chronic: Secondary | ICD-10-CM | POA: Diagnosis not present

## 2021-07-27 DIAGNOSIS — F840 Autistic disorder: Secondary | ICD-10-CM | POA: Diagnosis not present

## 2021-07-30 DIAGNOSIS — F4312 Post-traumatic stress disorder, chronic: Secondary | ICD-10-CM | POA: Diagnosis not present

## 2021-07-30 DIAGNOSIS — F840 Autistic disorder: Secondary | ICD-10-CM | POA: Diagnosis not present

## 2021-08-01 DIAGNOSIS — F840 Autistic disorder: Secondary | ICD-10-CM | POA: Diagnosis not present

## 2021-08-05 ENCOUNTER — Encounter: Payer: Self-pay | Admitting: Adult Health

## 2021-08-05 NOTE — Telephone Encounter (Signed)
Please advise 

## 2021-08-06 DIAGNOSIS — F4312 Post-traumatic stress disorder, chronic: Secondary | ICD-10-CM | POA: Diagnosis not present

## 2021-08-06 DIAGNOSIS — F840 Autistic disorder: Secondary | ICD-10-CM | POA: Diagnosis not present

## 2021-08-06 NOTE — Telephone Encounter (Signed)
Please advise 

## 2021-08-07 ENCOUNTER — Other Ambulatory Visit: Payer: Self-pay | Admitting: Adult Health

## 2021-08-07 DIAGNOSIS — Z789 Other specified health status: Secondary | ICD-10-CM

## 2021-08-07 MED ORDER — ALTEPLASE 2 MG IJ SOLR: 2.0000 mg | Freq: Once | INTRAMUSCULAR | Status: DC | PRN

## 2021-08-07 NOTE — Progress Notes (Signed)
Spoke to [employer] at infusion center to advised me on orders for TPA for port flush. Orders entered into epic

## 2021-08-07 NOTE — Telephone Encounter (Signed)
FYI

## 2021-08-09 ENCOUNTER — Encounter (HOSPITAL_COMMUNITY): Payer: BC Managed Care – PPO

## 2021-08-09 DIAGNOSIS — F840 Autistic disorder: Secondary | ICD-10-CM | POA: Diagnosis not present

## 2021-08-10 DIAGNOSIS — F840 Autistic disorder: Secondary | ICD-10-CM | POA: Diagnosis not present

## 2021-08-12 ENCOUNTER — Non-Acute Institutional Stay (HOSPITAL_COMMUNITY)
Admission: RE | Admit: 2021-08-12 | Discharge: 2021-08-12 | Disposition: A | Discharge status: Home / Self Care | Payer: BC Managed Care – PPO | Source: Ambulatory Visit | Attending: Internal Medicine | Admitting: Internal Medicine

## 2021-08-12 ENCOUNTER — Other Ambulatory Visit: Payer: Self-pay

## 2021-08-12 DIAGNOSIS — F4312 Post-traumatic stress disorder, chronic: Secondary | ICD-10-CM | POA: Diagnosis not present

## 2021-08-12 DIAGNOSIS — Z452 Encounter for adjustment and management of vascular access device: Secondary | ICD-10-CM | POA: Insufficient documentation

## 2021-08-12 DIAGNOSIS — F840 Autistic disorder: Secondary | ICD-10-CM | POA: Diagnosis not present

## 2021-08-12 DIAGNOSIS — G40801 Other epilepsy, not intractable, with status epilepticus: Secondary | ICD-10-CM | POA: Diagnosis not present

## 2021-08-12 MED ORDER — HEPARIN SOD (PORK) LOCK FLUSH 100 UNIT/ML IV SOLN
500.0000 [IU] | INTRAVENOUS | Status: AC | PRN
  Administered 2021-08-12: 500 [IU]

## 2021-08-12 MED ORDER — ALTEPLASE 2 MG IJ SOLR
2.0000 mg | Freq: Once | INTRAMUSCULAR | Status: AC
  Administered 2021-08-12: 2 mg
  Filled 2021-08-12: qty 2

## 2021-08-12 MED ORDER — SODIUM CHLORIDE 0.9% FLUSH: 10.0000 mL | INTRAVENOUS | Status: AC | PRN

## 2021-08-12 MED ORDER — HEPARIN SOD (PORK) LOCK FLUSH 100 UNIT/ML IV SOLN
500.0000 [IU] | INTRAVENOUS | Status: AC | PRN
  Filled 2021-08-12: qty 5

## 2021-08-12 NOTE — Progress Notes (Signed)
PATIENT CARE CENTER NOTE   Provider: Sallee Provencal, NP   Procedure: Cathflo to port-a-cath   Note:  Patient arrived to receive Cathflo through [facility] for pain with port flush. Patient's port accessed and blood return noted. Patient reports pain with PAC flushes. IV team arrived and placed Cathflo in [facility]. After 1 hour and 45 minutes, Cathflo pulled from [facility] by IV team. Upon flushing of PAC patient reported that she no longer felt any pain and was now able to taste the saline solution.  Patient's PAC flushed with Heparin and deaccessed. Gauze and band-aid placed over site. Patient alert, oriented and stable at discharge. AVS offered but patient refused. Discharged home with support person.

## 2021-08-13 DIAGNOSIS — F840 Autistic disorder: Secondary | ICD-10-CM | POA: Diagnosis not present

## 2021-08-13 DIAGNOSIS — F4312 Post-traumatic stress disorder, chronic: Secondary | ICD-10-CM | POA: Diagnosis not present

## 2021-08-15 ENCOUNTER — Encounter: Payer: Self-pay | Admitting: Adult Health

## 2021-08-16 ENCOUNTER — Encounter: Payer: Self-pay | Admitting: Adult Health

## 2021-08-16 DIAGNOSIS — F840 Autistic disorder: Secondary | ICD-10-CM | POA: Diagnosis not present

## 2021-08-20 DIAGNOSIS — F840 Autistic disorder: Secondary | ICD-10-CM | POA: Diagnosis not present

## 2021-08-20 DIAGNOSIS — F4312 Post-traumatic stress disorder, chronic: Secondary | ICD-10-CM | POA: Diagnosis not present

## 2021-08-22 DIAGNOSIS — F840 Autistic disorder: Secondary | ICD-10-CM | POA: Diagnosis not present

## 2021-08-27 DIAGNOSIS — F4312 Post-traumatic stress disorder, chronic: Secondary | ICD-10-CM | POA: Diagnosis not present

## 2021-08-27 DIAGNOSIS — F840 Autistic disorder: Secondary | ICD-10-CM | POA: Diagnosis not present

## 2021-08-29 DIAGNOSIS — F840 Autistic disorder: Secondary | ICD-10-CM | POA: Diagnosis not present

## 2021-09-02 DIAGNOSIS — Z0189 Encounter for other specified special examinations: Secondary | ICD-10-CM | POA: Diagnosis not present

## 2021-09-02 DIAGNOSIS — F840 Autistic disorder: Secondary | ICD-10-CM | POA: Diagnosis not present

## 2021-09-02 DIAGNOSIS — Z01812 Encounter for preprocedural laboratory examination: Secondary | ICD-10-CM | POA: Diagnosis not present

## 2021-09-03 ENCOUNTER — Other Ambulatory Visit: Payer: Self-pay

## 2021-09-03 ENCOUNTER — Encounter (HOSPITAL_BASED_OUTPATIENT_CLINIC_OR_DEPARTMENT_OTHER): Payer: Self-pay | Admitting: Obstetrics and Gynecology

## 2021-09-03 DIAGNOSIS — F4312 Post-traumatic stress disorder, chronic: Secondary | ICD-10-CM | POA: Diagnosis not present

## 2021-09-03 DIAGNOSIS — F840 Autistic disorder: Secondary | ICD-10-CM | POA: Diagnosis not present

## 2021-09-03 NOTE — Progress Notes (Addendum)
Spoke w/ via phone for pre-op interview---pt Lab needs dos----   Urine poct   I stat, surgery orders pending         Lab results------see below COVID test ------pt states asymptomatic no covid test needed Arrive at -------815 am 09-09-2021 NPO after MN NO Solid Food.  Clear liquids from MN until---715 am then npo Med rec completed Medications to take morning of surgery -----keppra  ativan prn, albuterol inhaler prn/bring inhaler with you Diabetic medication -----n/a Patient instructed to bring photo id and insurance card day of surgery Patient aware to have Driver (ride ) / caregiver mother catherine driver and caregiver, will wait outside, pt has permisson per beverly taavon for therapist christina rush to come to pre op day of surgery   for 24 hours after surgery  Patient Special Instructions -----overnight/extended stay instructions given to pt Pre-Op special Istructions -----surgery orders req dr Terri Piedra epic ib Patient verbalized understanding of instructions that were given at this phone interview. Patient denies shortness of breath, chest pain, fever, cough at this phone interview.   Chest xray 1 view 07-05-2020 epic lov neurlogy dr Delice Lesch  03-22-2021 epic Echo 10-04-2018 epic ekg 02-08-2021 epic lov cory nafziger np 02-05-2021 epic Medical clearance note 08-16-2021 cory nafziger on chart for 09-09-2021 surgery  Reviewed patient chart and history with Dr Annye Asa Mda patient ok for surgery on 09-09-2021 at [place]  Pt has permission for therapist to be with patient in pre op dos per beverly taavon  Has freestyyle libra on  upper arm  (hypoglycemia) lov endocrinology dr Janese Banks 01-30-2021 care everywhere/chart  Pt  wishes to use right chest pac day of surgery due to difficult iv access  PT WISHES TO NOT BE WEIGHED DAY OF SURGERY DUE TO ONGOING EATING DISORDER, PT GAVE RN WEIGHT ON 09-03-2021 OF 106 POUNDS.

## 2021-09-05 DIAGNOSIS — F840 Autistic disorder: Secondary | ICD-10-CM | POA: Diagnosis not present

## 2021-09-06 NOTE — Anesthesia Preprocedure Evaluation (Addendum)
Anesthesia Evaluation  Patient identified by MRN, date of birth, ID band Patient awake    History of Anesthesia Complications (+) PONV and PROLONGED EMERGENCE  Airway Mallampati: II  TM Distance: >3 FB Neck ROM: Full    Dental no notable dental hx. (+) Dental Advisory Given   Pulmonary asthma ,    Pulmonary exam normal        Cardiovascular negative cardio ROS Normal cardiovascular exam     Neuro/Psych  Headaches, Seizures - (03/22 last episode), Well Controlled,  PSYCHIATRIC DISORDERS Anxiety PTSD   GI/Hepatic Neg liver ROS, GERD  ,  Endo/Other  negative endocrine ROS  Renal/GU negative Renal ROS  negative genitourinary   Musculoskeletal negative musculoskeletal ROS (+)   Abdominal   Peds  Hematology  (+) anemia ,   Anesthesia Other Findings   Reproductive/Obstetrics Abnormal vaginal bleeding with ovarian cysts                           Anesthesia Physical Anesthesia Plan  ASA: 3  Anesthesia Plan: General   Post-op Pain Management:    Induction: Intravenous  PONV Risk Score and Plan: 4 or greater and Ondansetron, Dexamethasone, Midazolam, Treatment may vary due to age or medical condition and Aprepitant  Airway Management Planned: Oral ETT  Additional Equipment: None  Intra-op Plan:   Post-operative Plan: Extubation in OR  Informed Consent: I have reviewed the patients History and Physical, chart, labs and discussed the procedure including the risks, benefits and alternatives for the proposed anesthesia with the patient or authorized representative who has indicated his/her understanding and acceptance.     Dental advisory given  Plan Discussed with: CRNA, Anesthesiologist and Surgeon  Anesthesia Plan Comments:       Anesthesia Quick Evaluation

## 2021-09-09 ENCOUNTER — Other Ambulatory Visit: Payer: Self-pay

## 2021-09-09 ENCOUNTER — Encounter (HOSPITAL_BASED_OUTPATIENT_CLINIC_OR_DEPARTMENT_OTHER)
Admission: RE | Disposition: A | Discharge status: Home / Self Care | Payer: Self-pay | Source: Home / Self Care | Attending: Obstetrics and Gynecology

## 2021-09-09 ENCOUNTER — Ambulatory Visit (HOSPITAL_BASED_OUTPATIENT_CLINIC_OR_DEPARTMENT_OTHER)
Admission: RE | Admit: 2021-09-09 | Discharge: 2021-09-09 | Disposition: A | Discharge status: Home / Self Care | Payer: BC Managed Care – PPO | Attending: Obstetrics and Gynecology | Admitting: Obstetrics and Gynecology

## 2021-09-09 ENCOUNTER — Ambulatory Visit (HOSPITAL_BASED_OUTPATIENT_CLINIC_OR_DEPARTMENT_OTHER): Payer: BC Managed Care – PPO | Admitting: Anesthesiology

## 2021-09-09 ENCOUNTER — Encounter (HOSPITAL_BASED_OUTPATIENT_CLINIC_OR_DEPARTMENT_OTHER): Payer: Self-pay | Admitting: Obstetrics and Gynecology

## 2021-09-09 DIAGNOSIS — N831 Corpus luteum cyst of ovary, unspecified side: Secondary | ICD-10-CM | POA: Diagnosis not present

## 2021-09-09 DIAGNOSIS — N8302 Follicular cyst of left ovary: Secondary | ICD-10-CM | POA: Insufficient documentation

## 2021-09-09 DIAGNOSIS — G40509 Epileptic seizures related to external causes, not intractable, without status epilepticus: Secondary | ICD-10-CM | POA: Diagnosis not present

## 2021-09-09 DIAGNOSIS — Z888 Allergy status to other drugs, medicaments and biological substances status: Secondary | ICD-10-CM | POA: Diagnosis not present

## 2021-09-09 DIAGNOSIS — F840 Autistic disorder: Secondary | ICD-10-CM | POA: Diagnosis not present

## 2021-09-09 DIAGNOSIS — Z880 Allergy status to penicillin: Secondary | ICD-10-CM | POA: Diagnosis not present

## 2021-09-09 DIAGNOSIS — Z90710 Acquired absence of both cervix and uterus: Secondary | ICD-10-CM | POA: Insufficient documentation

## 2021-09-09 DIAGNOSIS — N8311 Corpus luteum cyst of right ovary: Secondary | ICD-10-CM | POA: Diagnosis not present

## 2021-09-09 DIAGNOSIS — Z79899 Other long term (current) drug therapy: Secondary | ICD-10-CM | POA: Diagnosis not present

## 2021-09-09 DIAGNOSIS — G40209 Localization-related (focal) (partial) symptomatic epilepsy and epileptic syndromes with complex partial seizures, not intractable, without status epilepticus: Secondary | ICD-10-CM | POA: Insufficient documentation

## 2021-09-09 DIAGNOSIS — N946 Dysmenorrhea, unspecified: Secondary | ICD-10-CM | POA: Diagnosis not present

## 2021-09-09 DIAGNOSIS — N736 Female pelvic peritoneal adhesions (postinfective): Secondary | ICD-10-CM | POA: Diagnosis not present

## 2021-09-09 DIAGNOSIS — F4312 Post-traumatic stress disorder, chronic: Secondary | ICD-10-CM | POA: Diagnosis not present

## 2021-09-09 DIAGNOSIS — Z881 Allergy status to other antibiotic agents status: Secondary | ICD-10-CM | POA: Diagnosis not present

## 2021-09-09 DIAGNOSIS — G43909 Migraine, unspecified, not intractable, without status migrainosus: Secondary | ICD-10-CM | POA: Diagnosis not present

## 2021-09-09 DIAGNOSIS — F4310 Post-traumatic stress disorder, unspecified: Secondary | ICD-10-CM | POA: Diagnosis not present

## 2021-09-09 DIAGNOSIS — N8312 Corpus luteum cyst of left ovary: Secondary | ICD-10-CM | POA: Diagnosis not present

## 2021-09-09 DIAGNOSIS — G40409 Other generalized epilepsy and epileptic syndromes, not intractable, without status epilepticus: Secondary | ICD-10-CM | POA: Diagnosis not present

## 2021-09-09 DIAGNOSIS — Z885 Allergy status to narcotic agent status: Secondary | ICD-10-CM | POA: Insufficient documentation

## 2021-09-09 DIAGNOSIS — N8301 Follicular cyst of right ovary: Secondary | ICD-10-CM | POA: Diagnosis not present

## 2021-09-09 DIAGNOSIS — Z91040 Latex allergy status: Secondary | ICD-10-CM | POA: Insufficient documentation

## 2021-09-09 DIAGNOSIS — N939 Abnormal uterine and vaginal bleeding, unspecified: Secondary | ICD-10-CM | POA: Diagnosis not present

## 2021-09-09 LAB — POCT I-STAT, CHEM 8
BUN: 11 mg/dL (ref 6–20)
Calcium, Ion: 1.16 mmol/L (ref 1.15–1.40)
Chloride: 105 mmol/L (ref 98–111)
Creatinine, Ser: 0.7 mg/dL (ref 0.44–1.00)
Glucose, Bld: 88 mg/dL (ref 70–99)
HCT: 37 % (ref 36.0–46.0)
Hemoglobin: 12.6 g/dL (ref 12.0–15.0)
Potassium: 3.8 mmol/L (ref 3.5–5.1)
Sodium: 137 mmol/L (ref 135–145)
TCO2: 23 mmol/L (ref 22–32)

## 2021-09-09 LAB — TYPE AND SCREEN
ABO/RH(D): O POS
Antibody Screen: NEGATIVE

## 2021-09-09 LAB — POCT PREGNANCY, URINE: Preg Test, Ur: NEGATIVE

## 2021-09-09 SURGERY — OOPHORECTOMY, LAPAROSCOPIC
Anesthesia: General | Site: Abdomen | Laterality: Bilateral

## 2021-09-09 MED ORDER — LACTATED RINGERS IV SOLN: INTRAVENOUS | Status: DC

## 2021-09-09 MED ORDER — MIDAZOLAM HCL 2 MG/2ML IJ SOLN
INTRAMUSCULAR | Status: AC
  Filled 2021-09-09: qty 2

## 2021-09-09 MED ORDER — LORAZEPAM 0.5 MG PO TABS
0.5000 mg | ORAL_TABLET | ORAL | Status: DC | PRN
  Administered 2021-09-09: 0.5 mg via ORAL

## 2021-09-09 MED ORDER — HYDROMORPHONE HCL 2 MG PO TABS: 1.0000 mg | ORAL_TABLET | Freq: Four times a day (QID) | ORAL | 0 refills | Status: AC | PRN

## 2021-09-09 MED ORDER — KETOROLAC TROMETHAMINE 30 MG/ML IJ SOLN: 30.0000 mg | Freq: Once | INTRAMUSCULAR | Status: DC

## 2021-09-09 MED ORDER — FENTANYL CITRATE (PF) 100 MCG/2ML IJ SOLN
INTRAMUSCULAR | Status: DC | PRN
  Administered 2021-09-09: 100 ug via INTRAVENOUS
  Administered 2021-09-09 (×2): 25 ug via INTRAVENOUS

## 2021-09-09 MED ORDER — DEXAMETHASONE SODIUM PHOSPHATE 10 MG/ML IJ SOLN
INTRAMUSCULAR | Status: DC | PRN
  Administered 2021-09-09: 8 mg via INTRAVENOUS

## 2021-09-09 MED ORDER — PROPOFOL 10 MG/ML IV BOLUS
INTRAVENOUS | Status: AC
  Filled 2021-09-09: qty 20

## 2021-09-09 MED ORDER — CIPROFLOXACIN IN D5W 400 MG/200ML IV SOLN
INTRAVENOUS | Status: AC
  Filled 2021-09-09: qty 200

## 2021-09-09 MED ORDER — DEXMEDETOMIDINE (PRECEDEX) IN NS 20 MCG/5ML (4 MCG/ML) IV SYRINGE
PREFILLED_SYRINGE | INTRAVENOUS | Status: AC
  Filled 2021-09-09: qty 5

## 2021-09-09 MED ORDER — AMISULPRIDE (ANTIEMETIC) 5 MG/2ML IV SOLN: 10.0000 mg | Freq: Once | INTRAVENOUS | Status: DC | PRN

## 2021-09-09 MED ORDER — FENTANYL CITRATE (PF) 100 MCG/2ML IJ SOLN
INTRAMUSCULAR | Status: AC
  Filled 2021-09-09: qty 2

## 2021-09-09 MED ORDER — PROPOFOL 500 MG/50ML IV EMUL
INTRAVENOUS | Status: DC | PRN
  Administered 2021-09-09: 125 ug/kg/min via INTRAVENOUS

## 2021-09-09 MED ORDER — SODIUM CHLORIDE 0.9 % IR SOLN
Status: DC | PRN
  Administered 2021-09-09: 500 mL

## 2021-09-09 MED ORDER — BUPIVACAINE HCL (PF) 0.25 % IJ SOLN
INTRAMUSCULAR | Status: DC | PRN
  Administered 2021-09-09: 10 mL

## 2021-09-09 MED ORDER — METRONIDAZOLE 500 MG/100ML IV SOLN
500.0000 mg | INTRAVENOUS | Status: AC
  Administered 2021-09-09: 500 mg via INTRAVENOUS

## 2021-09-09 MED ORDER — KETAMINE HCL 50 MG/5ML IJ SOSY
PREFILLED_SYRINGE | INTRAMUSCULAR | Status: AC
  Filled 2021-09-09: qty 5

## 2021-09-09 MED ORDER — POVIDONE-IODINE 10 % EX SWAB: 2.0000 "application " | Freq: Once | CUTANEOUS | Status: DC

## 2021-09-09 MED ORDER — ONDANSETRON HCL 4 MG/2ML IJ SOLN
INTRAMUSCULAR | Status: AC
  Filled 2021-09-09: qty 2

## 2021-09-09 MED ORDER — LIDOCAINE HCL (PF) 2 % IJ SOLN
INTRAMUSCULAR | Status: AC
  Filled 2021-09-09: qty 5

## 2021-09-09 MED ORDER — ACETAMINOPHEN 500 MG PO TABS
1000.0000 mg | ORAL_TABLET | ORAL | Status: AC
  Administered 2021-09-09: 1000 mg via ORAL

## 2021-09-09 MED ORDER — DIPHENHYDRAMINE HCL 50 MG/ML IJ SOLN
INTRAMUSCULAR | Status: DC | PRN
  Administered 2021-09-09: 6.25 mg via INTRAVENOUS

## 2021-09-09 MED ORDER — KETAMINE HCL 10 MG/ML IJ SOLN
INTRAMUSCULAR | Status: DC | PRN
  Administered 2021-09-09 (×5): 5 mg via INTRAVENOUS

## 2021-09-09 MED ORDER — ROCURONIUM BROMIDE 10 MG/ML (PF) SYRINGE
PREFILLED_SYRINGE | INTRAVENOUS | Status: AC
  Filled 2021-09-09: qty 10

## 2021-09-09 MED ORDER — ACETAMINOPHEN 500 MG PO TABS
ORAL_TABLET | ORAL | Status: AC
  Filled 2021-09-09: qty 2

## 2021-09-09 MED ORDER — IBUPROFEN 600 MG PO TABS: 600.0000 mg | ORAL_TABLET | Freq: Four times a day (QID) | ORAL | 0 refills | Status: DC | PRN

## 2021-09-09 MED ORDER — CELECOXIB 200 MG PO CAPS
200.0000 mg | ORAL_CAPSULE | Freq: Once | ORAL | Status: AC
  Administered 2021-09-09: 200 mg via ORAL

## 2021-09-09 MED ORDER — DEXAMETHASONE SODIUM PHOSPHATE 10 MG/ML IJ SOLN
INTRAMUSCULAR | Status: AC
  Filled 2021-09-09: qty 1

## 2021-09-09 MED ORDER — METRONIDAZOLE 500 MG/100ML IV SOLN
INTRAVENOUS | Status: AC
  Filled 2021-09-09: qty 100

## 2021-09-09 MED ORDER — CIPROFLOXACIN IN D5W 400 MG/200ML IV SOLN
400.0000 mg | INTRAVENOUS | Status: AC
  Administered 2021-09-09: 400 mg via INTRAVENOUS

## 2021-09-09 MED ORDER — ROCURONIUM BROMIDE 100 MG/10ML IV SOLN
INTRAVENOUS | Status: DC | PRN
  Administered 2021-09-09: 50 mg via INTRAVENOUS

## 2021-09-09 MED ORDER — APREPITANT 40 MG PO CAPS
40.0000 mg | ORAL_CAPSULE | Freq: Once | ORAL | Status: AC
  Administered 2021-09-09: 40 mg via ORAL

## 2021-09-09 MED ORDER — HEPARIN SOD (PORK) LOCK FLUSH 100 UNIT/ML IV SOLN
500.0000 [IU] | INTRAVENOUS | Status: AC | PRN
  Administered 2021-09-09: 500 [IU]

## 2021-09-09 MED ORDER — CELECOXIB 200 MG PO CAPS
ORAL_CAPSULE | ORAL | Status: AC
  Filled 2021-09-09: qty 1

## 2021-09-09 MED ORDER — LORAZEPAM 0.5 MG PO TABS
0.5000 mg | ORAL_TABLET | ORAL | Status: DC | PRN
  Filled 2021-09-09: qty 1

## 2021-09-09 MED ORDER — SUGAMMADEX SODIUM 200 MG/2ML IV SOLN
INTRAVENOUS | Status: DC | PRN
  Administered 2021-09-09: 100 mg via INTRAVENOUS

## 2021-09-09 MED ORDER — HYDROMORPHONE HCL 2 MG PO TABS
ORAL_TABLET | ORAL | Status: AC
  Filled 2021-09-09: qty 1

## 2021-09-09 MED ORDER — ONDANSETRON HCL 4 MG/2ML IJ SOLN
INTRAMUSCULAR | Status: DC | PRN
Start: 2021-09-09 — End: 2021-09-09
  Administered 2021-09-09: 4 mg via INTRAVENOUS

## 2021-09-09 MED ORDER — APREPITANT 40 MG PO CAPS
ORAL_CAPSULE | ORAL | Status: AC
  Filled 2021-09-09: qty 1

## 2021-09-09 MED ORDER — MIDAZOLAM HCL 5 MG/5ML IJ SOLN
INTRAMUSCULAR | Status: DC | PRN
  Administered 2021-09-09: 2 mg via INTRAVENOUS

## 2021-09-09 MED ORDER — HYDROMORPHONE HCL 2 MG PO TABS
1.0000 mg | ORAL_TABLET | Freq: Once | ORAL | Status: AC
  Administered 2021-09-09: 1 mg via ORAL

## 2021-09-09 MED ORDER — FENTANYL CITRATE (PF) 100 MCG/2ML IJ SOLN
25.0000 ug | INTRAMUSCULAR | Status: DC | PRN
  Administered 2021-09-09 (×2): 25 ug via INTRAVENOUS

## 2021-09-09 MED ORDER — PROMETHAZINE HCL 25 MG/ML IJ SOLN: 6.2500 mg | INTRAMUSCULAR | Status: DC | PRN

## 2021-09-09 MED ORDER — PROPOFOL 10 MG/ML IV BOLUS
INTRAVENOUS | Status: DC | PRN
  Administered 2021-09-09: 120 mg via INTRAVENOUS
  Administered 2021-09-09: 20 mg via INTRAVENOUS
  Administered 2021-09-09: 30 mg via INTRAVENOUS

## 2021-09-09 SURGICAL SUPPLY — 36 items
ADH SKN CLS APL DERMABOND .7 (GAUZE/BANDAGES/DRESSINGS) ×1
BAG SPEC RTRVL LRG 6X4 10 (ENDOMECHANICALS) ×1
CABLE HIGH FREQUENCY MONO STRZ (ELECTRODE) IMPLANT
CATH ROBINSON RED A/P 16FR (CATHETERS) IMPLANT
COVER MAYO STAND STRL (DRAPES) ×2 IMPLANT
DECANTER SPIKE VIAL GLASS SM (MISCELLANEOUS) ×2 IMPLANT
DERMABOND ADVANCED (GAUZE/BANDAGES/DRESSINGS) ×1
DERMABOND ADVANCED .7 DNX12 (GAUZE/BANDAGES/DRESSINGS) ×1 IMPLANT
DRSG OPSITE POSTOP 3X4 (GAUZE/BANDAGES/DRESSINGS) IMPLANT
DURAPREP 26ML APPLICATOR (WOUND CARE) ×2 IMPLANT
GAUZE 4X4 16PLY ~~LOC~~+RFID DBL (SPONGE) ×4 IMPLANT
GLOVE SURG ENC MOIS LTX SZ6.5 (GLOVE) ×4 IMPLANT
GOWN STRL REUS W/TWL LRG LVL3 (GOWN DISPOSABLE) ×6 IMPLANT
KIT TURNOVER CYSTO (KITS) ×2 IMPLANT
MANIPULATOR UTERINE 7CM CLEARV (MISCELLANEOUS) ×2 IMPLANT
NS IRRIG 1000ML POUR BTL (IV SOLUTION) ×2 IMPLANT
PACK LAPAROSCOPY BASIN (CUSTOM PROCEDURE TRAY) ×2 IMPLANT
PACK TRENDGUARD 450 HYBRID PRO (MISCELLANEOUS) ×1 IMPLANT
POUCH SPECIMEN RETRIEVAL 10MM (ENDOMECHANICALS) ×1 IMPLANT
PROTECTOR NERVE ULNAR (MISCELLANEOUS) ×4 IMPLANT
SET SUCTION IRRIG HYDROSURG (IRRIGATION / IRRIGATOR) IMPLANT
SET TUBE SMOKE EVAC HIGH FLOW (TUBING) ×2 IMPLANT
SHEARS HARMONIC ACE PLUS 36CM (ENDOMECHANICALS) ×1 IMPLANT
SLEEVE XCEL OPT CAN 5 100 (ENDOMECHANICALS) ×2 IMPLANT
SOLUTION ELECTROLUBE (MISCELLANEOUS) IMPLANT
SUT VIC AB 3-0 PS2 18 (SUTURE) ×2
SUT VIC AB 3-0 PS2 18XBRD (SUTURE) ×1 IMPLANT
SUT VICRYL 0 UR6 27IN ABS (SUTURE) ×1 IMPLANT
SYS RETRIEVAL 5MM INZII UNIV (BASKET) ×2
SYSTEM CARTER THOMASON II (TROCAR) IMPLANT
SYSTEM RETRIEVL 5MM INZII UNIV (BASKET) IMPLANT
TOWEL OR 17X26 10 PK STRL BLUE (TOWEL DISPOSABLE) ×4 IMPLANT
TRENDGUARD 450 HYBRID PRO PACK (MISCELLANEOUS) ×2
TROCAR BLADELESS OPT 5 100 (ENDOMECHANICALS) ×4 IMPLANT
TROCAR XCEL NON-BLD 11X100MML (ENDOMECHANICALS) ×1 IMPLANT
WARMER LAPAROSCOPE (MISCELLANEOUS) ×2 IMPLANT

## 2021-09-09 NOTE — Anesthesia Postprocedure Evaluation (Signed)
Anesthesia Post Note  Patient: [name]  Procedure(s) Performed: LAPAROSCOPIC OOPHORECTOMY (Bilateral: Abdomen)     Patient location during evaluation: PACU Anesthesia Type: General Level of consciousness: sedated Pain management: pain level controlled Vital Signs Assessment: post-procedure vital signs reviewed and stable Respiratory status: spontaneous breathing and respiratory function stable Cardiovascular status: stable Postop Assessment: no apparent nausea or vomiting Anesthetic complications: no   No notable events documented.  Last Vitals:  Vitals:   09/09/21 1315 09/09/21 1330  BP: 108/71 105/69  Pulse: (!) 47 (!) 55  Resp: 10 15  Temp:  36.9 C  SpO2: 97% 98%    Last Pain:  Vitals:   09/09/21 0833  TempSrc: Oral  PainSc: 0-No pain                 Cloa Bushong DANIEL

## 2021-09-09 NOTE — Op Note (Signed)
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  Patient was taken to the operating room. Anesthesia was administered prior to her being transferred to the OR bed due to anxiety and pt request.  She was then transferred and placed in the dorsal lithotomy position.  Arms were comfortably placed at her sides and protected.  She was then prepped and draped in the normal sterile fashion. An appropriate timeout was performed. Her bladder was emptied via foley catheter and a sponge on a stick placed in the vagina. Her legs were lowered and attention was then turned to her  abdomen after draping.  A 5 mm infraumbilical incision was made at previous site after 1/4% marcaine injected and the optiview trocar and camera easily introduced into the abdominal cavity.Gas flow was then administered and a pneumoperitoneum obtained with approximate 3 L of CO2 gas. A second and third 60mm trocars were placed in the patients right  and left lower uterine segments under visualization. The sites were also consistent with previous scarring and marcaine administered beforehand.    With patient in trendelenburg the ovaries were easily noted in normal anatomic position. A small area of bowel was noted to be adhesed to the left lateral side wall close to the ovary thus this was reduced using the harmonic to provide even better  visualization of the left ovary.  Next, the harmonic scalpel was used to excise the ovaries hemostatically. The umbilical incision was extended to accommodate a 29mm scope. A bag was then placed and used to remove the ovaries. First a 35mm bag then a 39mm bag was placed. The  ovaries were removed with no complications.  Gross survey confirmed no bleeding or injury thus pt was flattened and trocars removed under visualization. The gas was allowed to escape through the umbilical trocar and anesthesia gave 5 deep breaths to further help prior to the trocar being removed.  The  umbilical incision was first closed with a 2-0 vicryl suture on a U- shaped needle then with a 4-0 vicryl suture for the skin. The same suture was used on the remaining incisions.  Dermabond was then applied.  The sponge stick was removed from the vagina.  All counts were noted to be correct x 2 per nursing team   Patient was then awakened and taken to the recovery room in stable condition.

## 2021-09-09 NOTE — Discharge Instructions (Addendum)
Call office with any concerns [phone]  DISCHARGE INSTRUCTIONS: Laparoscopy  The following instructions have been prepared to help you care for yourself upon your return home today.  Wound care:  Do not get the incision wet for the first 24 hours. The incision should be kept clean and dry.  The Band-Aids or dressings may be removed the day after surgery.  Should the incision become sore, red, and swollen after the first week, check with your doctor.  Personal hygiene:  Shower the day after your procedure.  Activity and limitations:  Do NOT drive or operate any equipment today.  Do NOT lift anything more than 15 pounds for 2-3 weeks after surgery.  Do NOT rest in bed all day.  Walking is encouraged. Walk each day, starting slowly with 5-minute walks 3 or 4 times a day. Slowly increase the length of your walks.  Walk up and down stairs slowly.  Do NOT do strenuous activities, such as golfing, playing tennis, bowling, running, biking, weight lifting, gardening, mowing, or vacuuming for 2-4 weeks. Ask your doctor when it is okay to start.  Diet: Eat a light meal as desired this evening. You may resume your usual diet tomorrow.  Return to work: This is dependent on the type of work you do. For the most part you can return to a desk job within a week of surgery. If you are more active at work, please discuss this with your doctor.  What to expect after your surgery: You may have a slight burning sensation when you urinate on the first day. You may have a very small amount of blood in the urine. Expect to have a small amount of vaginal discharge/light bleeding for 1-2 weeks. It is not unusual to have abdominal soreness and bruising for up to 2 weeks. You may be tired and need more rest for about 1 week. You may experience shoulder pain for 24-72 hours. Lying flat in bed may relieve it.  Call your doctor for any of the following:  Develop a fever of 100.4 or greater  Inability to urinate  6 hours after discharge from hospital  Severe pain not relieved by pain medications  Persistent of heavy bleeding at incision site  Redness or swelling around incision site after a week  Increasing nausea or vomiting    Post Anesthesia Home Care Instructions  Activity: Get plenty of rest for the remainder of the day. A responsible individual must stay with you for 24 hours following the procedure.  For the next 24 hours, DO NOT: -Drive a car -Paediatric nurse -Drink alcoholic beverages -Take any medication unless instructed by your physician -Make any legal decisions or sign important papers.  Meals: Start with liquid foods such as gelatin or soup. Progress to regular foods as tolerated. Avoid greasy, spicy, heavy foods. If nausea and/or vomiting occur, drink only clear liquids until the nausea and/or vomiting subsides. Call your physician if vomiting continues.  Special Instructions/Symptoms: Your throat may feel dry or sore from the anesthesia or the breathing tube placed in your throat during surgery. If this causes discomfort, gargle with warm salt water. The discomfort should disappear within 24 hours.

## 2021-09-09 NOTE — Anesthesia Procedure Notes (Signed)
Procedure Name: Intubation Date/Time: 09/09/2021 10:39 AM Performed by: Gwyndolyn Saxon, CRNA Pre-anesthesia Checklist: Patient identified, Emergency Drugs available, Suction available and Patient being monitored Patient Re-evaluated:Patient Re-evaluated prior to induction Oxygen Delivery Method: Circle system utilized Preoxygenation: Pre-oxygenation with 100% oxygen Induction Type: IV induction Ventilation: Mask ventilation without difficulty Laryngoscope Size: Miller and 2 Grade View: Grade I Tube type: Oral Tube size: 7.0 mm Number of attempts: 1 Airway Equipment and Method: Stylet Placement Confirmation: ETT inserted through vocal cords under direct vision, positive ETCO2 and breath sounds checked- equal and bilateral Secured at: 21 cm Tube secured with: Tape Dental Injury: Teeth and Oropharynx as per pre-operative assessment

## 2021-09-09 NOTE — H&P (Signed)
[name] is an 30 y.o. G female here for scheduled laparoscopic bilateral oopherectomy. 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 This has severely limited her options for managing her seizures. She had a total laparoscopic hysterectomy with bilateral salpingectomy in 02/2021 which reduced the frequency of menstrual bleeding induced seizures however recently had another grand mal seizure.  Pt has tried progestins in past and is currently on clobazam with continued seizures. Also depo lupron and Freida Busman were confirmed per neurologist to be contraindicated with benzodiazepines and also to lower effectiveness of ativan, medications she needs, thus not great options for her.   Given options for medical management have been exhausted, pt is a candidate for oopherectomy. She understands the inevitability of surgical menopause and associated symptoms and we have had discussions about possible use of SSRIs to manage hot flashes    Pertinent Gynecological History: Menses:  none; s/p TLH Bleeding: n/a Contraception: abstinence DES exposure: denies Blood transfusions: none Sexually transmitted diseases: no past history Previous GYN Procedures:  tlh/bs   Last mammogram:  n/a  Date:  Last pap: normal Date:  OB History: G0, P0   Menstrual History: Menarche age: 6 Patient's last menstrual period was 02/11/2021.    Past Medical History:  Diagnosis Date   Acne    Anemia    hx of   Anxiety    Asthma    Carpal tunnel syndrome    right   Complex partial seizures ([place])    last seizure [id number]   Complication of anesthesia    slow to wake up, disoriented, hx of  mild seizure after surgery -2019 gallbladder    Coordination problem    Cyst of brain    states is collection of scar tissue - was kicked by a horse as a teenager   Difficult intravenous access    RIGHT CHEST PAC   Gallstones    GERD (gastroesophageal reflux disease)     Head injury, intracranial, with concussion    Hearing loss    both ears   Heart murmur    states no known problems, no cardiologist   History of asthma    no current med.   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)    Migraines    Ovarian cyst    Right   Pneumonia 12/2018   hx of    PONV (postoperative nausea and vomiting)    PTSD (post-traumatic stress disorder)    PTSD (post-traumatic stress disorder)    Shoulder dislocation 09/2018   Left   Shoulder subluxation, left    Syncope    Tremor, unspecified    bilateral arms   Wears glasses    Wears hearing aid    bilateral    Past Surgical History:  Procedure Laterality Date   ABDOMINAL HYSTERECTOMY     CHOLECYSTECTOMY N/A 10/05/2018   Procedure: LAPAROSCOPIC CHOLECYSTECTOMY WITH INTRAOPERATIVE CHOLANGIOGRAM ERAS PATHWAY;  Surgeon: Alphonsa Overall, MD;  Location: [facility];  Service: General;  Laterality: N/A;   CYSTOSCOPY N/A 03/12/2021   Procedure: Consuela Mimes;  Surgeon: Sherlyn Hay, DO;  Location: [facility];  Service: Gynecology;  Laterality: N/A;   LAPAROSCOPIC APPENDECTOMY  11/26/2011   Procedure: APPENDECTOMY LAPAROSCOPIC;  Surgeon: Gayland Curry, MD;  Location: [facility];  Service: General;  Laterality: N/A;   left shoulder manipulation  in er multilple times last done july 2021   [place] Right 08/28/2017   Procedure: POWER PORT PLACEMENT;  Surgeon: Alphonsa Overall, MD;  Location: [facility];  Service: General;  Laterality: Right;   PORTACATH PLACEMENT N/A 12/04/2017   Procedure: INSERTION PORT-A-CATH;  Surgeon: Alphonsa Overall, MD;  Location: [facility];  Service: General;  Laterality: N/A;   RADIOLOGY WITH ANESTHESIA Left 10/25/2019   Procedure: MRI  LEFT SHOLDER WITHOUT CONTRAST;  Surgeon: Radiologist, Medication, MD;  Location: [facility];  Service: Radiology;  Laterality: Left;   REPAIR ANKLE LIGAMENT Left    x 3    TOTAL LAPAROSCOPIC HYSTERECTOMY WITH SALPINGECTOMY Bilateral 03/12/2021   Procedure: TOTAL LAPAROSCOPIC HYSTERECTOMY WITH SALPINGECTOMY;  Surgeon: Sherlyn Hay, DO;  Location: [facility];  Service: Gynecology;  Laterality: Bilateral;    Family History  Problem Relation Age of Onset   Polymyalgia rheumatica Mother    Heart disease Mother    Sudden Cardiac Death Father        long QT syndrome    Heart disease Maternal Grandmother    Breast cancer Maternal Grandmother    Heart disease Maternal Grandfather    Stroke Maternal Grandfather    Diabetes Maternal Grandfather    COPD Maternal Grandfather    Kidney failure Maternal Grandfather    Heart disease Paternal Grandmother    Heart disease Paternal Grandfather    Migraines Paternal Grandfather     Social History:  reports that she has never smoked. She has never used smokeless tobacco. She reports that she does not drink alcohol and does not use drugs.  Allergies:  Allergies  Allergen Reactions   Cefzil [Cefprozil] Anaphylaxis, Shortness Of Breath and Rash   Cephalosporins Anaphylaxis, Shortness Of Breath and Rash   Penicillins Anaphylaxis, Hives, Rash and Other (See Comments)    Tongue swells, throat does not close  Did it involve swelling of the face/tongue/throat, SOB, or low BP? No Did it involve sudden or severe rash/hives, skin peeling, or any reaction on the inside of your mouth or nose? Yes Did you need to seek medical attention at a hospital or doctor's office? No When did it last happen?      4-5 years If all above answers are "NO", may proceed with cephalosporin use.    Midazolam Nausea And Vomiting    Not effective at controlling seizure   Shellfish Allergy Hives and Rash   Morphine And Related Nausea And Vomiting    Severe n/v   Latex Rash   Tegaderm Ag Mesh [Silver] Rash   Vancomycin Hives, Itching and Rash    Medications Prior to Admission  Medication Sig Dispense Refill Last Dose   acetaminophen  (TYLENOL) 500 MG tablet Take 1,000 mg by mouth 3 (three) times daily as needed for moderate pain or headache.    09/08/2021 at 0900   albuterol (PROVENTIL HFA;VENTOLIN HFA) 108 (90 Base) MCG/ACT inhaler Inhale 2 puffs into the lungs every 6 (six) hours as needed for wheezing or shortness of breath.    09/08/2021   Continuous Blood Gluc Sensor (DEXCOM G6 SENSOR) MISC USE TO MONITOR BLOOD SUGARS 3 each 12 09/09/2021   Continuous Blood Gluc Transmit (DEXCOM G6 TRANSMITTER) MISC Use with dexcom receiver and sensor 1 each 3 09/09/2021   diphenhydrAMINE (BENADRYL) 25 MG tablet Take 25 mg by mouth at bedtime.   09/08/2021 at 1900   ibuprofen (ADVIL) 600 MG tablet Take 1 tablet (600 mg total) by mouth every 6 (six) hours as  needed. (Patient taking differently: Take 600 mg by mouth every 6 (six) hours as needed for fever, headache or mild pain.) 40 tablet 1 Past Month   Lacosamide (VIMPAT) 100 MG TABS Take 1 tablet (100 mg total) by mouth 2 (two) times daily. 60 tablet 5 09/08/2021 at 1900   levETIRAcetam (KEPPRA) 500 MG tablet Take 1 tablet three times a day 90 tablet 11 09/08/2021 at 1900   Perampanel (FYCOMPA) 4 MG TABS Take 2 tablets (8 mg total) by mouth every evening. 60 tablet 5 09/08/2021 at 1900   spironolactone (ALDACTONE) 50 MG tablet Take 50 mg by mouth every evening.   09/08/2021 at 1900   Adapalene 0.3 % gel Apply 1 application topically 2 (two) times daily.   More than a month   clindamycin (CLEOCIN T) 1 % lotion Apply 1 application topically as needed. Apply to affected areas of the face 2 times a day  1 More than a month   Dapsone 5 % topical gel Apply 1 application topically daily.    More than a month   Glucagon, rDNA, (GLUCAGON EMERGENCY) 1 MG KIT Use PRN for hypoglycemia 1 kit 3 09/06/2021   glucose monitoring kit (FREESTYLE) monitoring kit 1 each by Does not apply route as needed for other. Free style libre on right arm   More than a month   LORazepam (ATIVAN) 0.5 MG tablet Take 1 tablet as needed  for seizure. (Patient taking differently: Take 0.5 mg by mouth as needed for seizure.) 10 tablet 5 09/07/2021   nortriptyline (PAMELOR) 10 MG capsule Take 1 capsule (10 mg total) by mouth at bedtime. (Patient taking differently: Take 10 mg by mouth at bedtime as needed (headache).) 90 capsule 3 09/02/2021    Review of Systems  Constitutional:  Negative for activity change and fatigue.  Eyes:  Negative for photophobia and visual disturbance.  Respiratory:  Negative for chest tightness and shortness of breath.   Cardiovascular:  Negative for chest pain, palpitations and leg swelling.  Gastrointestinal:  Positive for abdominal pain.  Genitourinary:  Positive for pelvic pain.  Musculoskeletal:  Negative for myalgias.  Neurological:  Positive for seizures.  Psychiatric/Behavioral:  The patient is nervous/anxious.    Blood pressure (!) 133/102, pulse 76, temperature 98.2 F (36.8 C), temperature source Oral, resp. rate 17, height 5' 4.5" (1.638 m), weight 53.7 kg, last menstrual period 02/11/2021, SpO2 100 %. Physical Exam Constitutional:      Appearance: Normal appearance.  Pulmonary:     Effort: Pulmonary effort is normal.  Musculoskeletal:        General: Normal range of motion.     Cervical back: Normal range of motion.  Skin:    General: Skin is warm.     Capillary Refill: Capillary refill takes 2 to 3 seconds.  Neurological:     General: No focal deficit present.     Mental Status: She is alert and oriented to person, place, and time. Mental status is at baseline.  Psychiatric:        Mood and Affect: Mood normal.        Behavior: Behavior normal.        Thought Content: Thought content normal.    Results for orders placed or performed during the hospital encounter of 09/09/21 (from the past 24 hour(s))  Pregnancy, urine POC     Status: None   Collection Time: 09/09/21  8:25 AM  Result Value Ref Range   Preg Test, Ur NEGATIVE NEGATIVE  No results  found.  Assessment/Plan: 30yo G0 female with catamenial seizures here for laparoscopic bilateral oopherectomy - Admit - ERAS protocol - Review expectations, risks, benefits and obtain consent  - To OR when ready   Venetia Night Jenisis Harmsen 09/09/2021, 9:01 AM

## 2021-09-09 NOTE — Transfer of Care (Signed)
Immediate Anesthesia Transfer of Care Note  Patient: [name]  Procedure(s) Performed: LAPAROSCOPIC OOPHORECTOMY (Bilateral: Abdomen)  Patient Location: PACU  Anesthesia Type:General  Level of Consciousness: awake and patient cooperative  Airway & Oxygen Therapy: Patient Spontanous Breathing  Post-op Assessment: Report given to RN and Post -op Vital signs reviewed and stable  Post vital signs: Reviewed and stable  Last Vitals:  Vitals Value Taken Time  BP 110/78 09/09/21 1228  Temp 36.6 C 09/09/21 1200  Pulse 62 09/09/21 1240  Resp 16 09/09/21 1240  SpO2 99 % 09/09/21 1240  Vitals shown include unvalidated device data.  Last Pain:  Vitals:   09/09/21 0833  TempSrc: Oral  PainSc: 0-No pain      Patients Stated Pain Goal: 3 (68/93/40 6840)  Complications: No notable events documented.

## 2021-09-10 DIAGNOSIS — F4312 Post-traumatic stress disorder, chronic: Secondary | ICD-10-CM | POA: Diagnosis not present

## 2021-09-10 DIAGNOSIS — F840 Autistic disorder: Secondary | ICD-10-CM | POA: Diagnosis not present

## 2021-09-10 LAB — SURGICAL PATHOLOGY

## 2021-09-12 DIAGNOSIS — F840 Autistic disorder: Secondary | ICD-10-CM | POA: Diagnosis not present

## 2021-09-17 DIAGNOSIS — F4312 Post-traumatic stress disorder, chronic: Secondary | ICD-10-CM | POA: Diagnosis not present

## 2021-09-17 DIAGNOSIS — F840 Autistic disorder: Secondary | ICD-10-CM | POA: Diagnosis not present

## 2021-09-18 ENCOUNTER — Encounter: Payer: Self-pay | Admitting: Adult Health

## 2021-09-18 NOTE — Telephone Encounter (Signed)
Please advise if an appt. Is needed.

## 2021-09-19 ENCOUNTER — Other Ambulatory Visit: Payer: Self-pay | Admitting: Adult Health

## 2021-09-19 DIAGNOSIS — F840 Autistic disorder: Secondary | ICD-10-CM | POA: Diagnosis not present

## 2021-09-19 MED ORDER — ALBUTEROL SULFATE HFA 108 (90 BASE) MCG/ACT IN AERS: 2.0000 | INHALATION_SPRAY | Freq: Four times a day (QID) | RESPIRATORY_TRACT | 3 refills | Status: AC | PRN

## 2021-09-19 MED ORDER — MONTELUKAST SODIUM 10 MG PO TABS
10.0000 mg | ORAL_TABLET | Freq: Every day | ORAL | 3 refills | Status: DC
Start: 2021-09-19 — End: 2022-09-09

## 2021-09-23 DIAGNOSIS — F840 Autistic disorder: Secondary | ICD-10-CM | POA: Diagnosis not present

## 2021-09-26 DIAGNOSIS — F840 Autistic disorder: Secondary | ICD-10-CM | POA: Diagnosis not present

## 2021-09-26 DIAGNOSIS — F4312 Post-traumatic stress disorder, chronic: Secondary | ICD-10-CM | POA: Diagnosis not present

## 2021-10-01 DIAGNOSIS — F840 Autistic disorder: Secondary | ICD-10-CM | POA: Diagnosis not present

## 2021-10-01 DIAGNOSIS — F4312 Post-traumatic stress disorder, chronic: Secondary | ICD-10-CM | POA: Diagnosis not present

## 2021-10-03 ENCOUNTER — Other Ambulatory Visit: Payer: Self-pay | Admitting: Obstetrics and Gynecology

## 2021-10-03 DIAGNOSIS — F4312 Post-traumatic stress disorder, chronic: Secondary | ICD-10-CM | POA: Diagnosis not present

## 2021-10-03 DIAGNOSIS — Z1509 Genetic susceptibility to other malignant neoplasm: Secondary | ICD-10-CM

## 2021-10-03 DIAGNOSIS — F840 Autistic disorder: Secondary | ICD-10-CM | POA: Diagnosis not present

## 2021-10-03 DIAGNOSIS — Z1231 Encounter for screening mammogram for malignant neoplasm of breast: Secondary | ICD-10-CM

## 2021-10-08 ENCOUNTER — Encounter: Payer: Self-pay | Admitting: Adult Health

## 2021-10-08 DIAGNOSIS — F840 Autistic disorder: Secondary | ICD-10-CM | POA: Diagnosis not present

## 2021-10-08 DIAGNOSIS — F4312 Post-traumatic stress disorder, chronic: Secondary | ICD-10-CM | POA: Diagnosis not present

## 2021-10-10 ENCOUNTER — Ambulatory Visit: Payer: BC Managed Care – PPO | Admitting: Neurology

## 2021-10-10 ENCOUNTER — Other Ambulatory Visit: Payer: Self-pay

## 2021-10-10 ENCOUNTER — Encounter: Payer: Self-pay | Admitting: Neurology

## 2021-10-10 VITALS — BP 116/74 | HR 73 | Ht 64.5 in | Wt 106.0 lb

## 2021-10-10 DIAGNOSIS — G43009 Migraine without aura, not intractable, without status migrainosus: Secondary | ICD-10-CM | POA: Diagnosis not present

## 2021-10-10 DIAGNOSIS — G40019 Localization-related (focal) (partial) idiopathic epilepsy and epileptic syndromes with seizures of localized onset, intractable, without status epilepticus: Secondary | ICD-10-CM

## 2021-10-10 DIAGNOSIS — R42 Dizziness and giddiness: Secondary | ICD-10-CM | POA: Diagnosis not present

## 2021-10-10 NOTE — Patient Instructions (Addendum)
Continue all your medications  2. Referral will be sent for Vestibular therapy  3. Discuss insomnia with PCP  4. Continue to monitor all your other symptoms  5. Follow-up in 6 months, call for any changes   Seizure Precautions: 1. If medication has been prescribed for you to prevent seizures, take it exactly as directed.  Do not stop taking the medicine without talking to your doctor first, even if you have not had a seizure in a long time.   2. Avoid activities in which a seizure would cause danger to yourself or to others.  Don't operate dangerous machinery, swim alone, or climb in high or dangerous places, such as on ladders, roofs, or girders.  Do not drive unless your doctor says you may.  3. If you have any warning that you may have a seizure, lay down in a safe place where you can't hurt yourself.    4.  No driving for 6 months from last seizure, as per [facility]   Please refer to the following link on the [place] website for more information: http://[email or web address]/[email or web address]/driving/drivingu.cfm   5.  Maintain good sleep hygiene. Avoid alcohol.  6.  Contact your doctor if you have any problems that may be related to the medicine you are taking.  7.  Call 911 and bring the patient back to the ED if:        A.  The seizure lasts longer than 5 minutes.       B.  The patient doesn't awaken shortly after the seizure  C.  The patient has new problems such as difficulty seeing, speaking or moving  D.  The patient was injured during the seizure  E.  The patient has a temperature over 102 F (39C)  F.  The patient vomited and now is having trouble breathing
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
## 2021-10-11 DIAGNOSIS — F840 Autistic disorder: Secondary | ICD-10-CM | POA: Diagnosis not present

## 2021-10-15 DIAGNOSIS — F4312 Post-traumatic stress disorder, chronic: Secondary | ICD-10-CM | POA: Diagnosis not present

## 2021-10-15 DIAGNOSIS — F840 Autistic disorder: Secondary | ICD-10-CM | POA: Diagnosis not present

## 2021-10-16 ENCOUNTER — Encounter: Payer: Self-pay | Admitting: Adult Health

## 2021-10-16 ENCOUNTER — Ambulatory Visit (INDEPENDENT_AMBULATORY_CARE_PROVIDER_SITE_OTHER): Payer: BC Managed Care – PPO | Admitting: Adult Health

## 2021-10-16 ENCOUNTER — Other Ambulatory Visit: Payer: Self-pay

## 2021-10-16 ENCOUNTER — Other Ambulatory Visit: Payer: Self-pay | Admitting: Adult Health

## 2021-10-16 VITALS — BP 108/70 | HR 65 | Temp 98.3°F | Ht 64.5 in | Wt 105.0 lb

## 2021-10-16 DIAGNOSIS — G4700 Insomnia, unspecified: Secondary | ICD-10-CM

## 2021-10-16 DIAGNOSIS — Z95828 Presence of other vascular implants and grafts: Secondary | ICD-10-CM | POA: Diagnosis not present

## 2021-10-16 DIAGNOSIS — R232 Flushing: Secondary | ICD-10-CM

## 2021-10-16 DIAGNOSIS — F419 Anxiety disorder, unspecified: Secondary | ICD-10-CM | POA: Diagnosis not present

## 2021-10-16 MED ORDER — GABAPENTIN 300 MG PO CAPS: 300.0000 mg | ORAL_CAPSULE | Freq: Every day | ORAL | 0 refills | Status: DC
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
## 2021-10-18 DIAGNOSIS — F840 Autistic disorder: Secondary | ICD-10-CM | POA: Diagnosis not present

## 2021-10-22 DIAGNOSIS — F840 Autistic disorder: Secondary | ICD-10-CM | POA: Diagnosis not present

## 2021-10-22 DIAGNOSIS — F4312 Post-traumatic stress disorder, chronic: Secondary | ICD-10-CM | POA: Diagnosis not present

## 2021-10-22 DIAGNOSIS — H903 Sensorineural hearing loss, bilateral: Secondary | ICD-10-CM | POA: Diagnosis not present

## 2021-10-25 NOTE — H&P (Signed)
[name] is an 30 y.o. G female, BRCA positive with positive family history of breast and ovarian cancer here for MRI breasts under anesthesia . 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 She had a total laparoscopic hysterectomy with bilateral salpingectomy in 02/2021 and a subsequent bilateral oopherectomy in the past 2-3 months.   Pt and her neurologist have expressed concern that mammograms will trigger her siezures. Given her history, pt was advised to consider MRI for breast screening however due to hisotry of seizures with MRI in past, pt will need done under anesthesia     Pertinent Gynecological History: Menses:  none; s/p TLH Bleeding: n/a Contraception: abstinence DES exposure: denies Blood transfusions: none Sexually transmitted diseases: no past history Previous GYN Procedures:  tlh/bs and bilateral oopherectomy  Last mammogram:  n/a  Date:  Last pap: normal Date:  OB History: G0, P0        Menstrual History: Menarche age: 64 Patient's last menstrual period was 02/11/2021.       Past Medical History:  Diagnosis Date   Acne     Anemia      hx of   Anxiety     Asthma     Carpal tunnel syndrome      right   Complex partial seizures ([place])      last seizure 05-21-8937   Complication of anesthesia      slow to wake up, disoriented, hx of  mild seizure after surgery -2019 gallbladder    Coordination problem     Cyst of brain      states is collection of scar tissue - was kicked by a horse as a teenager   Difficult intravenous access      RIGHT CHEST PAC   Gallstones     GERD (gastroesophageal reflux disease)     Head injury, intracranial, with concussion     Hearing loss      both ears   Heart murmur      states no known problems, no cardiologist   History of asthma      no current med.   History of spider veins      both legs    Hypoglycemia     Joint pain     MERRF (myoclonus epilepsy and ragged  red fibers) ([facility])     Migraines     Ovarian cyst      Right   Pneumonia 12/2018    hx of    PONV (postoperative nausea and vomiting)     PTSD (post-traumatic stress disorder)     PTSD (post-traumatic stress disorder)     Shoulder dislocation 09/2018    Left   Shoulder subluxation, left     Syncope     Tremor, unspecified      bilateral arms   Wears glasses     Wears hearing aid      bilateral           Past Surgical History:  Procedure Laterality Date   ABDOMINAL HYSTERECTOMY       CHOLECYSTECTOMY N/A 10/05/2018    Procedure: LAPAROSCOPIC CHOLECYSTECTOMY WITH INTRAOPERATIVE CHOLANGIOGRAM ERAS PATHWAY;  Surgeon: Alphonsa Overall, MD;  Location[facility];  Service: General;  Laterality: N/A;   CYSTOSCOPY N/A 03/12/2021    Procedure: Consuela Mimes;  Surgeon: Sherlyn Hay, DO;  Location: [place];  Service: Gynecology;  Laterality: N/A;   LAPAROSCOPIC APPENDECTOMY   11/26/2011    Procedure: APPENDECTOMY LAPAROSCOPIC;  Surgeon: Gayland Curry, MD;  Location: [facility];  Service: General;  Laterality: N/A;   left shoulder manipulation        in er multilple times last done july 2021   PORTACATH PLACEMENT Right 08/28/2017    Procedure: POWER PORT PLACEMENT;  Surgeon: Alphonsa Overall, MD;  Location: [facility];  Service: General;  Laterality: Right;   PORTACATH PLACEMENT N/A 12/04/2017    Procedure: INSERTION PORT-A-CATH;  Surgeon: Alphonsa Overall, MD;  Location: [facility] [place];  Service: General;  Laterality: N/A;   RADIOLOGY WITH ANESTHESIA Left 10/25/2019    Procedure: MRI  LEFT SHOLDER WITHOUT CONTRAST;  Surgeon: Radiologist, Medication, MD;  Location: [facility];  Service: Radiology;  Laterality: Left;   REPAIR ANKLE LIGAMENT Left      x 3   TOTAL LAPAROSCOPIC HYSTERECTOMY WITH SALPINGECTOMY Bilateral 03/12/2021    Procedure: TOTAL LAPAROSCOPIC HYSTERECTOMY WITH SALPINGECTOMY;  Surgeon: Sherlyn Hay, DO;  Location: [place];  Service: Gynecology;  Laterality: Bilateral;            Family History  Problem Relation Age of Onset   Polymyalgia rheumatica Mother     Heart disease Mother     Sudden Cardiac Death Father          long QT syndrome    Heart disease Maternal Grandmother     Breast cancer Maternal Grandmother     Heart disease Maternal Grandfather     Stroke Maternal Grandfather     Diabetes Maternal Grandfather     COPD Maternal Grandfather     Kidney failure Maternal Grandfather     Heart disease Paternal Grandmother     Heart disease Paternal Grandfather     Migraines Paternal Grandfather        Social History:  reports that she has never smoked. She has never used smokeless tobacco. She reports that she does not drink alcohol and does not use drugs.   Allergies:       Allergies  Allergen Reactions   Cefzil [Cefprozil] Anaphylaxis, Shortness Of Breath and Rash   Cephalosporins Anaphylaxis, Shortness Of Breath and Rash   Penicillins Anaphylaxis, Hives, Rash and Other (See Comments)      Tongue swells, throat does not close   Did it involve swelling of the face/tongue/throat, SOB, or low BP? No Did it involve sudden or severe rash/hives, skin peeling, or any reaction on the inside of your mouth or nose? Yes Did you need to seek medical attention at a hospital or doctor's office? No When did it last happen?      4-5 years If all above answers are "NO", may proceed with cephalosporin use.     Midazolam Nausea And Vomiting      Not effective at controlling seizure   Shellfish Allergy Hives and Rash   Morphine And Related Nausea And Vomiting      Severe n/v   Latex Rash   Tegaderm Ag Mesh [Silver] Rash   Vancomycin Hives, Itching and Rash             Medications Prior to Admission  Medication Sig Dispense Refill Last Dose   acetaminophen (TYLENOL) 500 MG tablet Take 1,000 mg by mouth 3 (three) times daily as needed for moderate pain or headache.      09/08/2021 at 0900   albuterol (PROVENTIL HFA;VENTOLIN HFA) 108 (90 Base) MCG/ACT inhaler  Inhale 2 puffs into the lungs every 6 (six) hours as needed for wheezing or shortness of breath.  09/08/2021   Continuous Blood Gluc Sensor (DEXCOM G6 SENSOR) MISC USE TO MONITOR BLOOD SUGARS 3 each 12 09/09/2021   Continuous Blood Gluc Transmit (DEXCOM G6 TRANSMITTER) MISC Use with dexcom receiver and sensor 1 each 3 09/09/2021   diphenhydrAMINE (BENADRYL) 25 MG tablet Take 25 mg by mouth at bedtime.     09/08/2021 at 1900   ibuprofen (ADVIL) 600 MG tablet Take 1 tablet (600 mg total) by mouth every 6 (six) hours as needed. (Patient taking differently: Take 600 mg by mouth every 6 (six) hours as needed for fever, headache or mild pain.) 40 tablet 1 Past Month   Lacosamide (VIMPAT) 100 MG TABS Take 1 tablet (100 mg total) by mouth 2 (two) times daily. 60 tablet 5 09/08/2021 at 1900   levETIRAcetam (KEPPRA) 500 MG tablet Take 1 tablet three times a day 90 tablet 11 09/08/2021 at 1900   Perampanel (FYCOMPA) 4 MG TABS Take 2 tablets (8 mg total) by mouth every evening. 60 tablet 5 09/08/2021 at 1900   spironolactone (ALDACTONE) 50 MG tablet Take 50 mg by mouth every evening.     09/08/2021 at 1900   Adapalene 0.3 % gel Apply 1 application topically 2 (two) times daily.     More than a month   clindamycin (CLEOCIN T) 1 % lotion Apply 1 application topically as needed. Apply to affected areas of the face 2 times a day   1 More than a month   Dapsone 5 % topical gel Apply 1 application topically daily.      More than a month   Glucagon, rDNA, (GLUCAGON EMERGENCY) 1 MG KIT Use PRN for hypoglycemia 1 kit 3 09/06/2021   glucose monitoring kit (FREESTYLE) monitoring kit 1 each by Does not apply route as needed for other. Free style libre on right arm     More than a month   LORazepam (ATIVAN) 0.5 MG tablet Take 1 tablet as needed for seizure. (Patient taking differently: Take 0.5 mg by mouth as needed for seizure.) 10 tablet 5 09/07/2021   nortriptyline (PAMELOR) 10 MG capsule Take 1 capsule (10 mg total) by mouth at  bedtime. (Patient taking differently: Take 10 mg by mouth at bedtime as needed (headache).) 90 capsule 3 09/02/2021      Review of Systems  Constitutional:  Negative for activity change and fatigue.  Eyes:  Negative for photophobia and visual disturbance.  Respiratory:  Negative for chest tightness and shortness of breath.   Cardiovascular:  Negative for chest pain, palpitations and leg swelling.  Gastrointestinal:  Positive for abdominal pain.  Genitourinary:  Positive for pelvic pain.  Musculoskeletal:  Negative for myalgias.  Neurological:  Positive for seizures.  Psychiatric/Behavioral:  The patient is nervous/anxious.     Blood pressure (!) 133/102, pulse 76, temperature 98.2 F (36.8 C), temperature source Oral, resp. rate 17, height 5' 4.5" (1.638 m), weight 53.7 kg, last menstrual period 02/11/2021, SpO2 100 %. Physical Exam Constitutional:      Appearance: Normal appearance.  Pulmonary:     Effort: Pulmonary effort is normal.  Musculoskeletal:        General: Normal range of motion.     Cervical back: Normal range of motion.  Skin:    General: Skin is warm.     Capillary Refill: Capillary refill takes 2 to 3 seconds.  Neurological:     General: No focal deficit present.     Mental Status: She is alert and oriented to person, place, and time. Mental  status is at baseline.  Psychiatric:        Mood and Affect: Mood normal.        Behavior: Behavior normal.        Thought Content: Thought content normal.      Lab Results Last 24 Hours       Results for orders placed or performed during the hospital encounter of 09/09/21 (from the past 24 hour(s))  Pregnancy, urine POC     Status: None    Collection Time: 09/09/21  8:25 AM  Result Value Ref Range    Preg Test, Ur NEGATIVE NEGATIVE        Imaging Results (Last 48 hours)  No results found.     Assessment/Plan: 30yo G0 female with catamenial seizures, BRCA + with need for Annual MRI under anesthesia - Admit for  procedure  - ERAS protocol - Review expectations, risks, benefits and obtain consent

## 2021-10-28 ENCOUNTER — Ambulatory Visit: Payer: BC Managed Care – PPO | Admitting: Physical Therapy

## 2021-10-29 DIAGNOSIS — F4312 Post-traumatic stress disorder, chronic: Secondary | ICD-10-CM | POA: Diagnosis not present

## 2021-10-30 ENCOUNTER — Other Ambulatory Visit (HOSPITAL_COMMUNITY): Payer: Self-pay | Admitting: Obstetrics and Gynecology

## 2021-10-30 DIAGNOSIS — Z1501 Genetic susceptibility to malignant neoplasm of breast: Secondary | ICD-10-CM

## 2021-10-30 DIAGNOSIS — Z1509 Genetic susceptibility to other malignant neoplasm: Secondary | ICD-10-CM

## 2021-11-01 DIAGNOSIS — F840 Autistic disorder: Secondary | ICD-10-CM | POA: Diagnosis not present

## 2021-11-05 ENCOUNTER — Encounter: Payer: Self-pay | Admitting: Adult Health

## 2021-11-05 DIAGNOSIS — F4312 Post-traumatic stress disorder, chronic: Secondary | ICD-10-CM | POA: Diagnosis not present

## 2021-11-05 DIAGNOSIS — F840 Autistic disorder: Secondary | ICD-10-CM | POA: Diagnosis not present

## 2021-11-06 NOTE — Telephone Encounter (Signed)
Please advise 

## 2021-11-12 DIAGNOSIS — F4312 Post-traumatic stress disorder, chronic: Secondary | ICD-10-CM | POA: Diagnosis not present

## 2021-11-12 DIAGNOSIS — F840 Autistic disorder: Secondary | ICD-10-CM | POA: Diagnosis not present

## 2021-11-13 ENCOUNTER — Telehealth: Payer: Self-pay

## 2021-11-13 ENCOUNTER — Other Ambulatory Visit: Payer: Self-pay | Admitting: Adult Health

## 2021-11-13 DIAGNOSIS — Z95828 Presence of other vascular implants and grafts: Secondary | ICD-10-CM

## 2021-11-13 MED ORDER — ALTEPLASE 2 MG IJ SOLR: 2.0000 mg | Freq: Once | INTRAMUSCULAR | Status: DC

## 2021-11-13 NOTE — Telephone Encounter (Signed)
Spoke to pt and she stated that 2 moths ago her port could not be accessed. Pt also stated that she was advised that she could do a TPA. Pt agreed and said that tit helped the nurse get into the port without having a lt of pain. Pt is having another procedure done and wants to have another TPA done. Pt thinks that the port needs to be replaced. She does not want to go through the pain again and asking what she can do.

## 2021-11-13 NOTE — Telephone Encounter (Signed)
Noted! Pt notified of update. Pt also was concerned that she hasn't heard from [facility]. I advised pt to give them a call to possibly expedite scheduling.

## 2021-11-13 NOTE — Telephone Encounter (Signed)
Patient called asking if a referral can be sent to TPA to make sure port cath is working proper for upcoming surgery

## 2021-11-13 NOTE — Progress Notes (Signed)
Patient ID: [name], female   DOB: 12-Feb-1991, 30 y.o.   MRN: [id number] Pt has severe anxiety and PTSD.  She is accompanied to all appointments by her therapist, Margreta Journey  Please allow Margreta Journey to accompany Kynley to MRI breast appointment on Tuesday 11/19/21. In the past anesthesia has allowed [place] in pre op and pacu areas.   Thank you  C.Dumfries

## 2021-11-14 ENCOUNTER — Encounter: Payer: Self-pay | Admitting: Neurology

## 2021-11-14 DIAGNOSIS — F840 Autistic disorder: Secondary | ICD-10-CM | POA: Diagnosis not present

## 2021-11-16 ENCOUNTER — Encounter (HOSPITAL_COMMUNITY): Payer: Self-pay | Admitting: *Deleted

## 2021-11-16 NOTE — Progress Notes (Signed)
PCP - Joice Lofts @ [facility] Cardiologist - not currently, has seen one in the past EKG - 02-08-21 Chest x-ray - na ECHO - 10/04/18 Cardiac Cath - na CPAP - na  Pt. Not diabetic but states she has hypoglycemia. Wears a  dexcom.   ERAS Protcol - yes  COVID TEST- na  Anesthesia review: yes. Pt. Reports she has complex partial seizures.Seizures are increasing daily, last one 11/15/21 and had 4-5 that day.  Symptoms:eyes opened, drooling, making sounds, 1-2 limbs jerky, heart rate changes, disoriented, last about 30 seconds. 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 States last surgery while in PACU, had complex seizure  and received ativan.  Pt. Wants to use her port-a cath- to be put to sleep,then they can get a peripheral IV. States last procedure she was stuck 12 times.  -------------  SDW INSTRUCTIONS:  Your procedure is scheduled on 11/19/21. Please report to [facility] Main Entrance "A" at 0730 A.M., and check in at the Admitting office. Call this number if you have problems the morning of surgery: [phone]   Remember: Do not eat  after midnight the night before your surgery  You may drink clear liquids until 0700 the morning of your surgery.   Clear liquids allowed are: Water, Non-Citrus Juices (without pulp), Carbonated Beverages, Clear Tea, Black Coffee Only, and Gatorade   Medications to take morning of surgery with a sip of water include: wellbutrin  As of today, STOP taking any Aspirin (unless otherwise instructed by your surgeon), Aleve, Naproxen, Ibuprofen, Motrin, Advil, Goody's, BC's, all herbal medications, fish oil, and all vitamins.    The Morning of Surgery Do not wear jewelry, make-up or nail polish. Do not wear lotions, powders, or perfumes/colognes, or deodorant Do not shave 48 hours prior to surgery.   Men may shave face and neck. Do not bring valuables to the hospital. [facility] is not responsible for any  belongings or valuables.  If you are a smoker, DO NOT Smoke 24 hours prior to surgery  If you wear a CPAP at night please bring your mask the morning of surgery   Remember that you must have someone to transport you home after your surgery, and remain with you for 24 hours if you are discharged the same day.  Please bring cases for contacts, glasses, hearing aids, dentures or bridgework because it cannot be worn into surgery.   Patients discharged the day of surgery will not be allowed to drive home.   Please shower the NIGHT BEFORE/MORNING OF SURGERY (use antibacterial soap like DIAL soap if possible). Wear comfortable clothes the morning of surgery. Oral Hygiene is also important to reduce your risk of infection.  Remember - BRUSH YOUR TEETH THE MORNING OF SURGERY WITH YOUR REGULAR TOOTHPASTE  Patient denies shortness of breath, fever, cough and chest pain.

## 2021-11-16 NOTE — Progress Notes (Signed)
Sent message to Anette Riedel, Rolla Flatten, Jovita Kussmaul of Dr. Terri Piedra requesting for pt's therapist to accompany pt. In pre-op and PACU due to severe anxiety and PTSD.

## 2021-11-18 ENCOUNTER — Other Ambulatory Visit: Payer: Self-pay

## 2021-11-18 ENCOUNTER — Non-Acute Institutional Stay (HOSPITAL_COMMUNITY)
Admission: RE | Admit: 2021-11-18 | Discharge: 2021-11-18 | Disposition: A | Discharge status: Home / Self Care | Payer: BC Managed Care – PPO | Source: Ambulatory Visit | Attending: Internal Medicine | Admitting: Internal Medicine

## 2021-11-18 DIAGNOSIS — G40909 Epilepsy, unspecified, not intractable, without status epilepticus: Secondary | ICD-10-CM | POA: Insufficient documentation

## 2021-11-18 DIAGNOSIS — F840 Autistic disorder: Secondary | ICD-10-CM | POA: Diagnosis not present

## 2021-11-18 DIAGNOSIS — Z452 Encounter for adjustment and management of vascular access device: Secondary | ICD-10-CM | POA: Diagnosis not present

## 2021-11-18 DIAGNOSIS — F4312 Post-traumatic stress disorder, chronic: Secondary | ICD-10-CM | POA: Diagnosis not present

## 2021-11-18 MED ORDER — HEPARIN SOD (PORK) LOCK FLUSH 100 UNIT/ML IV SOLN
500.0000 [IU] | INTRAVENOUS | Status: AC | PRN
  Administered 2021-11-18: 500 [IU]

## 2021-11-18 MED ORDER — ALTEPLASE 2 MG IJ SOLR
2.0000 mg | Freq: Once | INTRAMUSCULAR | Status: DC
  Filled 2021-11-18: qty 2

## 2021-11-18 MED ORDER — SODIUM CHLORIDE 0.9% FLUSH
10.0000 mL | INTRAVENOUS | Status: AC | PRN
  Administered 2021-11-18: 10 mL

## 2021-11-18 NOTE — Anesthesia Preprocedure Evaluation (Addendum)
Anesthesia Evaluation  Patient identified by MRN, date of birth, ID band Patient awake    Reviewed: Allergy & Precautions, NPO status , Patient's Chart, lab work & pertinent test results  History of Anesthesia Complications (+) PONV and history of anesthetic complications  Airway Mallampati: II  TM Distance: >3 FB Neck ROM: Full    Dental  (+) Dental Advisory Given, Teeth Intact   Pulmonary asthma ,    breath sounds clear to auscultation       Cardiovascular negative cardio ROS   Rhythm:Regular     Neuro/Psych Seizures -,  PSYCHIATRIC DISORDERS Anxiety  Neuromuscular disease    GI/Hepatic Neg liver ROS, GERD  ,  Endo/Other  negative endocrine ROS  Renal/GU negative Renal ROS     Musculoskeletal   Abdominal   Peds  Hematology negative hematology ROS (+) Lab Results      Component                Value               Date                      WBC                      11.8 (H)            03/21/2021                HGB                      12.6                09/09/2021                HCT                      37.0                09/09/2021                MCV                      87.4                03/21/2021                PLT                      347                 03/21/2021              Anesthesia Other Findings   Reproductive/Obstetrics negative OB ROS Lab Results      Component                Value               Date                      PREGTESTUR               NEGATIVE            09/09/2021                PREGSERUM                NEGATIVE  10/05/2018                HCG                      <5.0                05/01/2019                                                                         Anesthesia Evaluation  Patient identified by MRN, date of birth, ID band Patient awake    History of Anesthesia Complications (+) PONV and PROLONGED  EMERGENCE  Airway Mallampati: II  TM Distance: >3 FB Neck ROM: Full    Dental no notable dental hx. (+) Dental Advisory Given   Pulmonary asthma ,    Pulmonary exam normal        Cardiovascular negative cardio ROS Normal cardiovascular exam     Neuro/Psych  Headaches, Seizures - (03/22 last episode), Well Controlled,  PSYCHIATRIC DISORDERS Anxiety PTSD   GI/Hepatic Neg liver ROS, GERD  ,  Endo/Other  negative endocrine ROS  Renal/GU negative Renal ROS  negative genitourinary   Musculoskeletal negative musculoskeletal ROS (+)   Abdominal   Peds  Hematology  (+) anemia ,   Anesthesia Other Findings   Reproductive/Obstetrics Abnormal vaginal bleeding with ovarian cysts                           Anesthesia Physical Anesthesia Plan  ASA: 3  Anesthesia Plan: General   Post-op Pain Management:    Induction: Intravenous  PONV Risk Score and Plan: 4 or greater and Ondansetron, Dexamethasone, Midazolam, Treatment may vary due to age or medical condition and Aprepitant  Airway Management Planned: Oral ETT  Additional Equipment: None  Intra-op Plan:   Post-operative Plan: Extubation in OR  Informed Consent: I have reviewed the patients History and Physical, chart, labs and discussed the procedure including the risks, benefits and alternatives for the proposed anesthesia with the patient or authorized representative who has indicated his/her understanding and acceptance.     Dental advisory given  Plan Discussed with: CRNA, Anesthesiologist and Surgeon  Anesthesia Plan Comments:       Anesthesia Quick Evaluation  Anesthesia Physical Anesthesia Plan  ASA: 3  Anesthesia Plan: General   Post-op Pain Management:    Induction: Intravenous  PONV Risk Score and Plan: 4 or greater and Ondansetron, Dexamethasone, Propofol infusion, TIVA and Midazolam  Airway Management Planned: Oral ETT  Additional Equipment:  None  Intra-op Plan:   Post-operative Plan: Extubation in OR  Informed Consent: I have reviewed the patients History and Physical, chart, labs and discussed the procedure including the risks, benefits and alternatives for the proposed anesthesia with the patient or authorized representative who has indicated his/her understanding and acceptance.     Dental advisory given  Plan Discussed with: CRNA and Anesthesiologist  Anesthesia Plan Comments: (PAT note by Karoline Caldwell, PA-C: Patient has history of seizures starting at age 30.  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  She is currently followed by Dr. Delice Lesch  for this.  She primarily has complex partial seizures that last for ~30 seconds with staring, drooling, mild limb jerking and self terminate.  She is currently maintained on Keppra, Vimpat, and Fycompa.  She was last seen by Dr. Delice Lesch 10/10/2021 and at that time reported being satisfied with her seizure control.  I spoke with patient by phone on 11/19/2019 to clarify seizure history.  She says that overall her seizures are stable compared to when she was last seen by Dr. Delice Lesch.  The pattern is variable, waxes and wanes.  She said recently she has had an increase in complex partial seizures due to insomnia secondary to recent right oophorectomy which induced menopause.  She says he did not have any complications with that surgery on 09/09/2021.  She also had total laparoscopic hysterectomy 0/38/8828 without complication.  She reports that she typically has 2-4 complex partial seizures in PACU after any surgery lasting ~30 seconds each.  She states that overall her seizure frequency feels stable and she has not recently had any generalized tonic-clonic seizures.  Patient has a port at the right chest that was placed for history of difficult venous access.  She states that for her last several surgeries she  has been given medication through her port to help her relax prior to peripheral IV access being obtained and that this has worked well she.  He also has a history of severe anxiety and PTSD and is accompanied to all appointments by her therapist, Margreta Journey.  Patient will need day of surgery labs and evaluation.  EKG 02/08/2021: Bradycardia.  Rate 58.  TTE 10/04/2018: - Left ventricle: The cavity size was normal. Systolic function was  normal. The estimated ejection fraction was in the range of 60%  to 65%. Wall motion was normal; there were no regional wall  motion abnormalities. Left ventricular diastolic function  parameters were normal.  - Aortic valve: Transvalvular velocity was within the normal range.  There was no stenosis. There was no regurgitation.  - Mitral valve: Transvalvular velocity was within the normal range.  There was no evidence for stenosis. There was no regurgitation.  - Right ventricle: The cavity size was normal. Wall thickness was  normal. Systolic function was normal.  - Pulmonary arteries: Systolic pressure was within the normal  range. PA peak pressure: 20 mm Hg (S).  )       Anesthesia Quick Evaluation

## 2021-11-18 NOTE — Progress Notes (Signed)
Anesthesia Chart Review:  Patient has history of seizures starting at age 30.  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  She is currently followed by Dr. Delice Lesch for this.  She primarily has complex partial seizures that last for ~30 seconds with staring, drooling, mild limb jerking and self terminate.  She is currently maintained on Keppra, Vimpat, and Fycompa.  She was last seen by Dr. Delice Lesch 10/10/2021 and at that time reported being satisfied with her seizure control.  I spoke with patient by phone on 11/19/2019 to clarify seizure history.  She says that overall her seizures are stable compared to when she was last seen by Dr. Delice Lesch.  The pattern is variable, waxes and wanes.  She said recently she has had an increase in complex partial seizures due to insomnia secondary to recent right oophorectomy which induced menopause.  She says he did not have any complications with that surgery on 09/09/2021.  She also had total laparoscopic hysterectomy 6/80/3212 without complication.  She reports that she typically has 2-4 complex partial seizures in PACU after any surgery lasting ~30 seconds each.  She states that overall her seizure frequency feels stable and she has not recently had any generalized tonic-clonic seizures.  Patient has a port at the right chest that was placed for history of difficult venous access.  She states that for her last several surgeries she has been given medication through her port to help her relax prior to peripheral IV access being obtained and that this has worked well she.  He also has a history of severe anxiety and PTSD and is accompanied to all appointments by her therapist, Margreta Journey.  Patient will need day of surgery labs and evaluation.  EKG 02/08/2021: Bradycardia.  Rate 58.  TTE 10/04/2018: - Left ventricle: The cavity size was normal. Systolic function was    normal. The  estimated ejection fraction was in the range of 60%    to 65%. Wall motion was normal; there were no regional wall    motion abnormalities. Left ventricular diastolic function    parameters were normal.  - Aortic valve: Transvalvular velocity was within the normal range.    There was no stenosis. There was no regurgitation.  - Mitral valve: Transvalvular velocity was within the normal range.    There was no evidence for stenosis. There was no regurgitation.  - Right ventricle: The cavity size was normal. Wall thickness was    normal. Systolic function was normal.  - Pulmonary arteries: Systolic pressure was within the normal    range. PA peak pressure: 20 mm Hg (S).    Wynonia Musty [facility] Short Stay Center/Anesthesiology Phone [phone] 11/18/2021 3:23 PM

## 2021-11-18 NOTE — Progress Notes (Addendum)
Patient Care Center Note     Provider: Sallee Provencal, NP    Procedure: Port-a-cath flush    Note: Patient arrived for right chest port-a-cath flush and Cathflo. Patient reports having pain with port access and flushes. Dr. Carlisle Cater ordered Cathflo be placed in port for possible obstruction.  Patient's PAC accessed using sterile technique. Blood return noted but patient reported pain with flushes. Patient also reports that port site is continuously painful when left accessed.  Cathflo ordered and IV team notified. While waiting for IV team, patient requested that PAC be de-accessed due to pain. Patient does not want to receive the Cathflo. PAC flushed with Heparin and 0.9% sodium chloride and de-accessed. Patient alert, oriented and ambulatory at discharge. Patient's support person remained at patient's side throughout appointment.  Patient discharged home with support person.

## 2021-11-19 ENCOUNTER — Ambulatory Visit (HOSPITAL_COMMUNITY): Payer: BC Managed Care – PPO | Admitting: Physician Assistant

## 2021-11-19 ENCOUNTER — Ambulatory Visit (HOSPITAL_COMMUNITY)
Admission: RE | Admit: 2021-11-19 | Discharge: 2021-11-19 | Disposition: A | Discharge status: Home / Self Care | Payer: BC Managed Care – PPO | Source: Ambulatory Visit | Attending: Obstetrics and Gynecology | Admitting: Obstetrics and Gynecology

## 2021-11-19 ENCOUNTER — Encounter (HOSPITAL_COMMUNITY): Payer: Self-pay | Admitting: Obstetrics and Gynecology

## 2021-11-19 ENCOUNTER — Encounter (HOSPITAL_COMMUNITY)
Admission: RE | Disposition: A | Discharge status: Home / Self Care | Payer: Self-pay | Source: Ambulatory Visit | Attending: Obstetrics and Gynecology

## 2021-11-19 ENCOUNTER — Other Ambulatory Visit: Payer: Self-pay

## 2021-11-19 DIAGNOSIS — E8842 MERRF syndrome: Secondary | ICD-10-CM | POA: Insufficient documentation

## 2021-11-19 DIAGNOSIS — F419 Anxiety disorder, unspecified: Secondary | ICD-10-CM | POA: Insufficient documentation

## 2021-11-19 DIAGNOSIS — G43909 Migraine, unspecified, not intractable, without status migrainosus: Secondary | ICD-10-CM | POA: Diagnosis not present

## 2021-11-19 DIAGNOSIS — F4310 Post-traumatic stress disorder, unspecified: Secondary | ICD-10-CM | POA: Insufficient documentation

## 2021-11-19 DIAGNOSIS — Z1509 Genetic susceptibility to other malignant neoplasm: Secondary | ICD-10-CM | POA: Insufficient documentation

## 2021-11-19 DIAGNOSIS — Z1239 Encounter for other screening for malignant neoplasm of breast: Secondary | ICD-10-CM | POA: Diagnosis not present

## 2021-11-19 DIAGNOSIS — Z90710 Acquired absence of both cervix and uterus: Secondary | ICD-10-CM | POA: Insufficient documentation

## 2021-11-19 DIAGNOSIS — F4312 Post-traumatic stress disorder, chronic: Secondary | ICD-10-CM | POA: Diagnosis not present

## 2021-11-19 DIAGNOSIS — Z1501 Genetic susceptibility to malignant neoplasm of breast: Secondary | ICD-10-CM | POA: Diagnosis not present

## 2021-11-19 DIAGNOSIS — Z90721 Acquired absence of ovaries, unilateral: Secondary | ICD-10-CM | POA: Insufficient documentation

## 2021-11-19 DIAGNOSIS — Z79899 Other long term (current) drug therapy: Secondary | ICD-10-CM | POA: Insufficient documentation

## 2021-11-19 DIAGNOSIS — G40019 Localization-related (focal) (partial) idiopathic epilepsy and epileptic syndromes with seizures of localized onset, intractable, without status epilepticus: Secondary | ICD-10-CM | POA: Diagnosis not present

## 2021-11-19 DIAGNOSIS — F840 Autistic disorder: Secondary | ICD-10-CM | POA: Diagnosis not present

## 2021-11-19 HISTORY — PX: RADIOLOGY WITH ANESTHESIA: SHX6223

## 2021-11-19 HISTORY — DX: Eating disorder, unspecified: F50.9

## 2021-11-19 LAB — GLUCOSE, CAPILLARY: Glucose-Capillary: 87 mg/dL (ref 70–99)

## 2021-11-19 SURGERY — MRI WITH ANESTHESIA
Anesthesia: General | Laterality: Bilateral

## 2021-11-19 MED ORDER — FENTANYL CITRATE (PF) 100 MCG/2ML IJ SOLN
INTRAMUSCULAR | Status: AC
  Filled 2021-11-19: qty 2

## 2021-11-19 MED ORDER — MIDAZOLAM HCL 2 MG/2ML IJ SOLN
INTRAMUSCULAR | Status: AC
  Filled 2021-11-19: qty 2

## 2021-11-19 MED ORDER — SUGAMMADEX SODIUM 200 MG/2ML IV SOLN
INTRAVENOUS | Status: DC | PRN
  Administered 2021-11-19: 300 mg via INTRAVENOUS

## 2021-11-19 MED ORDER — CHLORHEXIDINE GLUCONATE 0.12 % MT SOLN: 15.0000 mL | Freq: Once | OROMUCOSAL | Status: AC

## 2021-11-19 MED ORDER — ROCURONIUM BROMIDE 10 MG/ML (PF) SYRINGE
PREFILLED_SYRINGE | INTRAVENOUS | Status: DC | PRN
  Administered 2021-11-19: 40 mg via INTRAVENOUS
  Administered 2021-11-19: 60 mg via INTRAVENOUS

## 2021-11-19 MED ORDER — ONDANSETRON HCL 4 MG/2ML IJ SOLN
INTRAMUSCULAR | Status: DC | PRN
  Administered 2021-11-19: 4 mg via INTRAVENOUS

## 2021-11-19 MED ORDER — MIDAZOLAM HCL 2 MG/2ML IJ SOLN
INTRAMUSCULAR | Status: DC | PRN
  Administered 2021-11-19 (×2): 2 mg via INTRAVENOUS

## 2021-11-19 MED ORDER — PROPOFOL 10 MG/ML IV BOLUS
INTRAVENOUS | Status: DC | PRN
  Administered 2021-11-19 (×2): 50 mg via INTRAVENOUS
  Administered 2021-11-19: 40 mg via INTRAVENOUS
  Administered 2021-11-19: 160 mg via INTRAVENOUS

## 2021-11-19 MED ORDER — CHLORHEXIDINE GLUCONATE 0.12 % MT SOLN
OROMUCOSAL | Status: AC
  Administered 2021-11-19: 15 mL via OROMUCOSAL
  Filled 2021-11-19: qty 15

## 2021-11-19 MED ORDER — PROPOFOL 500 MG/50ML IV EMUL
INTRAVENOUS | Status: DC | PRN
  Administered 2021-11-19: 150 ug/kg/min via INTRAVENOUS

## 2021-11-19 MED ORDER — GADOBUTROL 1 MMOL/ML IV SOLN
5.0000 mL | Freq: Once | INTRAVENOUS | Status: AC | PRN
  Administered 2021-11-19: 5 mL via INTRAVENOUS

## 2021-11-19 MED ORDER — LACTATED RINGERS IV SOLN: INTRAVENOUS | Status: DC

## 2021-11-19 MED ORDER — ORAL CARE MOUTH RINSE: 15.0000 mL | Freq: Once | OROMUCOSAL | Status: AC

## 2021-11-19 MED ORDER — FENTANYL CITRATE (PF) 250 MCG/5ML IJ SOLN
INTRAMUSCULAR | Status: DC | PRN
  Administered 2021-11-19: 100 ug via INTRAVENOUS

## 2021-11-19 NOTE — Progress Notes (Addendum)
Per MRI and Dr. Ermalene Postin, no need for labs.

## 2021-11-19 NOTE — Anesthesia Procedure Notes (Signed)
Procedure Name: Intubation Date/Time: 11/19/2021 9:59 AM Performed by: Inda Coke, CRNA Pre-anesthesia Checklist: Patient identified, Emergency Drugs available, Suction available and Patient being monitored Patient Re-evaluated:Patient Re-evaluated prior to induction Oxygen Delivery Method: Circle System Utilized Preoxygenation: Pre-oxygenation with 100% oxygen Induction Type: IV induction Ventilation: Mask ventilation without difficulty Laryngoscope Size: Mac and 4 Grade View: Grade I Tube type: Oral Tube size: 7.0 mm Number of attempts: 1 Airway Equipment and Method: Stylet and Oral airway Placement Confirmation: ETT inserted through vocal cords under direct vision, positive ETCO2 and breath sounds checked- equal and bilateral Secured at: 22 cm Tube secured with: Tape Dental Injury: Teeth and Oropharynx as per pre-operative assessment

## 2021-11-19 NOTE — Transfer of Care (Signed)
Immediate Anesthesia Transfer of Care Note  Patient: [name]  Procedure(s) Performed: MRI WITH ANESTHESIA OF BILATERAL BREASTS WITH AND WITHOUT CONTRAST (Bilateral)  Patient Location: PACU  Anesthesia Type:General  Level of Consciousness: awake and alert   Airway & Oxygen Therapy: Patient Spontanous Breathing and Patient connected to face mask oxygen  Post-op Assessment: Report given to RN and Post -op Vital signs reviewed and stable  Post vital signs: Reviewed and stable  Last Vitals:  Vitals Value Taken Time  BP 106/63 11/19/21 1119  Temp    Pulse 87 11/19/21 1122  Resp 20 11/19/21 1122  SpO2 100 % 11/19/21 1122  Vitals shown include unvalidated device data.  Last Pain:  Vitals:   11/19/21 0809  TempSrc: Oral  PainSc: 0-No pain         Complications: No notable events documented.

## 2021-11-20 ENCOUNTER — Encounter (HOSPITAL_COMMUNITY): Payer: Self-pay | Admitting: Radiology

## 2021-11-20 NOTE — Anesthesia Postprocedure Evaluation (Signed)
Anesthesia Post Note  Patient: [name]  Procedure(s) Performed: MRI WITH ANESTHESIA OF BILATERAL BREASTS WITH AND WITHOUT CONTRAST (Bilateral)     Patient location during evaluation: PACU Anesthesia Type: General Level of consciousness: awake and alert Pain management: pain level controlled Vital Signs Assessment: post-procedure vital signs reviewed and stable Respiratory status: spontaneous breathing, nonlabored ventilation, respiratory function stable and patient connected to nasal cannula oxygen Cardiovascular status: blood pressure returned to baseline and stable Postop Assessment: no apparent nausea or vomiting Anesthetic complications: no   No notable events documented.  Last Vitals:  Vitals:   11/19/21 1200 11/19/21 1205  BP: 94/60 100/71  Pulse: (!) 53 (!) 51  Resp: 16 16  Temp:  (!) 36.1 C  SpO2: 100% 100%    Last Pain:  Vitals:   11/19/21 1205  TempSrc:   PainSc: 0-No pain                 Catarina Huntley

## 2021-11-22 DIAGNOSIS — F840 Autistic disorder: Secondary | ICD-10-CM | POA: Diagnosis not present

## 2021-11-26 ENCOUNTER — Telehealth (INDEPENDENT_AMBULATORY_CARE_PROVIDER_SITE_OTHER): Payer: BC Managed Care – PPO | Admitting: Adult Health

## 2021-11-26 ENCOUNTER — Encounter: Payer: Self-pay | Admitting: Adult Health

## 2021-11-26 VITALS — Ht 64.0 in

## 2021-11-26 DIAGNOSIS — F4312 Post-traumatic stress disorder, chronic: Secondary | ICD-10-CM | POA: Diagnosis not present

## 2021-11-26 DIAGNOSIS — Z1509 Genetic susceptibility to other malignant neoplasm: Secondary | ICD-10-CM

## 2021-11-26 DIAGNOSIS — Z803 Family history of malignant neoplasm of breast: Secondary | ICD-10-CM

## 2021-11-26 DIAGNOSIS — F840 Autistic disorder: Secondary | ICD-10-CM | POA: Diagnosis not present

## 2021-11-26 DIAGNOSIS — Z1502 Genetic susceptibility to malignant neoplasm of ovary: Secondary | ICD-10-CM

## 2021-11-26 DIAGNOSIS — Z1501 Genetic susceptibility to malignant neoplasm of breast: Secondary | ICD-10-CM

## 2021-11-27 NOTE — Progress Notes (Addendum)
Virtual Visit via Video Note  I connected with [name] on 11/27/21 at  5:00 PM EST by a video enabled telemedicine application and verified that I am speaking with the correct person using two identifiers.  Location patient: home Location provider:work or home office Persons participating in the virtual visit: patient, provider  I discussed the limitations of evaluation and management by telemedicine and the availability of in person appointments. The patient expressed understanding and agreed to proceed.   HPI: 30 year old female with strong family history of breast cancer and ovarian cancer on her mom side, has BRCA2 gene mutation.  Recently had MRI of bilateral breast which thankfully showed no evidence of breast malignancy.  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 She had a total laparoscopic hysterectomy with bilateral salpingectomy in 02/2021 and a subsequent bilateral oopherectomy in the past 2-3 months.    She would like to discuss whether it would be useful for her in the future to have bilateral mastectomies.  She is unsure if she would do augmentation after the mastectomy, she has not overtly concerned about the cosmetic appearance of her breast.   ROS: See pertinent positives and negatives per HPI.  Past Medical History:  Diagnosis Date   Acne    Anemia    hx of   Anxiety    Asthma    Carpal tunnel syndrome    right   Complex partial seizures ([place])    last seizure 08-21-4162   Complication of anesthesia    slow to wake up, disoriented, hx of  mild seizure after surgery -2019 gallbladder    Coordination problem    Cyst of brain    states is collection of scar tissue - was kicked by a horse as a teenager   Difficult intravenous access    RIGHT CHEST PAC   Eating disorder    Gallstones    GERD (gastroesophageal reflux disease)    Head injury, intracranial, with concussion    Hearing loss    both ears   Heart murmur     states no known problems, no cardiologist   History of asthma    no current med.   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)    Migraines    Ovarian cyst    Right   Pneumonia 12/2018   hx of    PONV (postoperative nausea and vomiting)    PTSD (post-traumatic stress disorder)    PTSD (post-traumatic stress disorder)    Shoulder dislocation 09/2018   Left   Shoulder subluxation, left    Syncope    Tremor, unspecified    bilateral arms   Wears glasses    Wears hearing aid    bilateral    Past Surgical History:  Procedure Laterality Date   ABDOMINAL HYSTERECTOMY     CHOLECYSTECTOMY N/A 10/05/2018   Procedure: LAPAROSCOPIC CHOLECYSTECTOMY WITH INTRAOPERATIVE CHOLANGIOGRAM ERAS PATHWAY;  Surgeon: Alphonsa Overall, MD;  Location: [facility];  Service: General;  Laterality: N/A;   CYSTOSCOPY N/A 03/12/2021   Procedure: Consuela Mimes;  Surgeon: Sherlyn Hay, DO;  Location: [place];  Service: Gynecology;  Laterality: N/A;   LAPAROSCOPIC APPENDECTOMY  11/26/2011   Procedure: APPENDECTOMY LAPAROSCOPIC;  Surgeon: Gayland Curry, MD;  Location: [place];  Service: General;  Laterality: N/A;   left shoulder manipulation     in er multilple  times last done july 2021   PORTACATH PLACEMENT Right 08/28/2017   Procedure: POWER PORT PLACEMENT;  Surgeon: Alphonsa Overall, MD;  Location: [place];  Service: General;  Laterality: Right;   PORTACATH PLACEMENT N/A 12/04/2017   Procedure: INSERTION PORT-A-CATH;  Surgeon: Alphonsa Overall, MD;  Location: [place];  Service: General;  Laterality: N/A;   RADIOLOGY WITH ANESTHESIA Left 10/25/2019   Procedure: MRI  LEFT SHOLDER WITHOUT CONTRAST;  Surgeon: Radiologist, Medication, MD;  Location: [place];  Service: Radiology;  Laterality: Left;   RADIOLOGY WITH ANESTHESIA Bilateral 11/19/2021   Procedure: MRI WITH ANESTHESIA OF BILATERAL BREASTS WITH AND WITHOUT CONTRAST;   Surgeon: Radiologist, Medication, MD;  Location: [place];  Service: Radiology;  Laterality: Bilateral;   REPAIR ANKLE LIGAMENT Left    x 3   TOTAL LAPAROSCOPIC HYSTERECTOMY WITH SALPINGECTOMY Bilateral 03/12/2021   Procedure: TOTAL LAPAROSCOPIC HYSTERECTOMY WITH SALPINGECTOMY;  Surgeon: Sherlyn Hay, DO;  Location: [place];  Service: Gynecology;  Laterality: Bilateral;    Family History  Problem Relation Age of Onset   Polymyalgia rheumatica Mother    Heart disease Mother    Sudden Cardiac Death Father        long QT syndrome    Heart disease Maternal Grandmother    Breast cancer Maternal Grandmother    Heart disease Maternal Grandfather    Stroke Maternal Grandfather    Diabetes Maternal Grandfather    COPD Maternal Grandfather    Kidney failure Maternal Grandfather    Heart disease Paternal Grandmother    Heart disease Paternal Grandfather    Migraines Paternal Grandfather        Current Outpatient Medications:    Adapalene 0.3 % gel, Apply 1 application topically 2 (two) times daily. Alternate when run out, Disp: , Rfl:    albuterol (VENTOLIN HFA) 108 (90 Base) MCG/ACT inhaler, Inhale 2 puffs into the lungs every 6 (six) hours as needed for wheezing or shortness of breath., Disp: 6.7 g, Rfl: 3   buPROPion (WELLBUTRIN XL) 150 MG 24 hr tablet, Take 150 mg by mouth 2 (two) times daily., Disp: , Rfl:    clindamycin (CLEOCIN T) 1 % lotion, Apply 1 application topically as needed (Acne). Apply to affected areas of the face 2 times a day, Disp: , Rfl: 1   Continuous Blood Gluc Sensor (DEXCOM G6 SENSOR) MISC, USE TO MONITOR BLOOD SUGARS, Disp: 3 each, Rfl: 12   Continuous Blood Gluc Transmit (DEXCOM G6 TRANSMITTER) MISC, Use with dexcom receiver and sensor, Disp: 1 each, Rfl: 3   Dapsone 5 % topical gel, Apply 1 application topically 2 (two) times daily., Disp: , Rfl:    diphenhydrAMINE (BENADRYL) 25 MG tablet, Take 25 mg by mouth at bedtime., Disp: , Rfl:    gabapentin  (NEURONTIN) 300 MG capsule, Take 1 capsule (300 mg total) by mouth at bedtime. (Patient taking differently: Take 300 mg by mouth at bedtime as needed (Sleep).), Disp: 90 capsule, Rfl: 0   Glucagon, rDNA, (GLUCAGON EMERGENCY) 1 MG KIT, Use PRN for hypoglycemia, Disp: 1 kit, Rfl: 3   glucose monitoring kit (FREESTYLE) monitoring kit, 1 each by Does not apply route as needed for other. Free style libre on right arm, Disp: , Rfl:    ibuprofen (ADVIL) 600 MG tablet, Take 1 tablet (600 mg total) by mouth every 6 (six) hours as needed for cramping or moderate pain. (Patient taking differently: Take 600 mg by mouth every 6 (six) hours as needed for cramping, moderate pain  or headache.), Disp: 30 tablet, Rfl: 0   Lacosamide (VIMPAT) 100 MG TABS, Take 1 tablet (100 mg total) by mouth 2 (two) times daily. (Patient taking differently: Take 200 mg by mouth at bedtime.), Disp: 60 tablet, Rfl: 5   levETIRAcetam (KEPPRA) 500 MG tablet, Take 1 tablet three times a day (Patient taking differently: 500 mg at bedtime. May take additional 500 mg three time a day as needed), Disp: 90 tablet, Rfl: 11   LORazepam (ATIVAN) 0.5 MG tablet, Take 1 tablet as needed for seizure. (Patient taking differently: Take 0.5 mg by mouth as needed for seizure.), Disp: 10 tablet, Rfl: 5   montelukast (SINGULAIR) 10 MG tablet, Take 1 tablet (10 mg total) by mouth at bedtime., Disp: 90 tablet, Rfl: 3   nortriptyline (PAMELOR) 10 MG capsule, Take 1 capsule (10 mg total) by mouth at bedtime. (Patient taking differently: Take 10 mg by mouth at bedtime as needed for sleep.), Disp: 90 capsule, Rfl: 3   Perampanel (FYCOMPA) 4 MG TABS, Take 2 tablets (8 mg total) by mouth every evening. (Patient taking differently: Take 4 mg by mouth at bedtime.), Disp: 60 tablet, Rfl: 5   spironolactone (ALDACTONE) 50 MG tablet, Take 50 mg by mouth at bedtime., Disp: , Rfl:   Current Facility-Administered Medications:    alteplase (CATHFLO ACTIVASE) injection 2 mg, 2  mg, Intracatheter, Once, Fredric Slabach, [name], NP  EXAM:  VITALS per patient if applicable:  GENERAL: alert, oriented, appears well and in no acute distress  HEENT: atraumatic, conjunttiva clear, no obvious abnormalities on inspection of external nose and ears  NECK: normal movements of the head and neck  LUNGS: on inspection no signs of respiratory distress, breathing rate appears normal, no obvious gross SOB, gasping or wheezing  CV: no obvious cyanosis  MS: moves all visible extremities without noticeable abnormality  PSYCH/NEURO: pleasant and cooperative, no obvious depression or anxiety, speech and thought processing grossly intact  ASSESSMENT AND PLAN:  Discussed the following assessment and plan:  1. Family history of breast cancer in female   2. BRCA2 gene mutation positive in female -We discussed pros and cons of having bilateral mastectomies.  I advised her that she should have an open and honest conversation with her And about this as well.  Due to her strong family history as well as her own personal health history if she ever developed breast cancer I think chemo/radiation would be detrimental for her, but with that being said this will be more of a personal choice.  This conversation lasted 30 minutes.  I discussed the assessment and treatment plan with the patient. The patient was provided an opportunity to ask questions and all were answered. The patient agreed with the plan and demonstrated an understanding of the instructions.   The patient was advised to call back or seek an in-person evaluation if the symptoms worsen or if the condition fails to improve as anticipated.   Dorothyann Peng, NP

## 2021-11-28 ENCOUNTER — Encounter: Payer: Self-pay | Admitting: Adult Health

## 2021-11-28 DIAGNOSIS — F840 Autistic disorder: Secondary | ICD-10-CM | POA: Diagnosis not present

## 2021-12-03 ENCOUNTER — Ambulatory Visit: Payer: Self-pay | Admitting: Surgery

## 2021-12-03 DIAGNOSIS — F4312 Post-traumatic stress disorder, chronic: Secondary | ICD-10-CM | POA: Diagnosis not present

## 2021-12-03 DIAGNOSIS — Z95828 Presence of other vascular implants and grafts: Secondary | ICD-10-CM | POA: Diagnosis not present

## 2021-12-03 DIAGNOSIS — F840 Autistic disorder: Secondary | ICD-10-CM | POA: Diagnosis not present

## 2021-12-03 NOTE — H&P (View-Only) (Signed)
History of Present Illness: [name] is a 30 y.o. female who is seen today to discuss port replacement. She has a history of catamenial seizures and BRCA-2 mutation. She has previously undergone laparoscopic hysterectomy with BSO, and undergoes annual breast MRI to screen for breast cancer. She has significant anxiety and usual undergoes general anesthesia for her MRIs. She previously had a port placed by Dr. Lucia Gaskins in 2018 via the right IJ for a history of difficult IV access.  She reports that prior to that she had a port on the right upper chest wall that became infected and had to be removed.     The port placed in 2018 was working very well until about 5 months ago.  Since then she reports she has had significant pain when the port is accessed, and that there is often difficulty accessing and flushing the port.  Every few weeks she endorses redness and pain over the port, and currently has some redness at the site.  She denies any fevers and chills during these episodes, and says they do not seem to be related to use of the port.  She uses the port for infusions of seizure medications, as well as ER visits.  The frequency with which she uses it varies, but sometimes she needs it multiple times per month.   Today she brought up that she would also like to discuss a prophylactic mastectomy as she has a BRCA mutation, but she wishes to do this later on this year and would like to have her port replaced first.  She is here today with her therapist and support person [place].     Review of Systems: A complete review of systems was obtained from the patient.  I have reviewed this information and discussed as appropriate with the patient.  See HPI as well for other ROS.     Medical History: Past Medical History Past Medical History: Diagnosis Date  Anxiety    Asthma, unspecified asthma severity, unspecified whether complicated, unspecified whether persistent    BRCA2 gene mutation positive in  female    Seizures (CMS-HCC)        Patient Active Problem List Diagnosis  Port-A-Cath in place  Seizures (CMS-HCC)  BRCA2 positive     Past Surgical History Past Surgical History: Procedure Laterality Date  hysterectomy with bilateral salpingectomy   03/12/2021  LAPAROSCOPIC OOPHORECTOMY Bilateral 09/09/2021  APPENDECTOMY      CHOLECYSTECTOMY          Allergies Allergies Allergen Reactions  Cephalosporins Anaphylaxis, Hives, Rash, Shortness Of Breath and Swelling     REACTION: rash, throat swells shut    Midazolam Nausea And Vomiting, Other (See Comments) and Shortness Of Breath     Not effective at controlling seizure    Penicillins Anaphylaxis, Hives, Other (See Comments) and Rash     Did it involve swelling of the face/tongue/throat, SOB, or low BP? No Did it involve sudden or severe rash/hives, skin peeling, or any reaction on the inside of your mouth or nose? Yes Did you need to seek medical attention at a hospital or doctor's office? No When did it last happen?      4-5 years If all above answers are NO, may proceed with cephalosporin use. Patient experienced rash on chest and throat tightening that started within 20 min of taking the medication.  Reaction occurred 3.5 years ago.  She is not sure what medication she took (class: Penicillins), but states she has liquid amoxicillin as  a child successfully.    Patient experienced rash on chest and throat tightening that started within 20 min of taking the medication.  Reaction occurred 3.5 years ago.  She is not sure what medication she took (class: Penicillins), but states she has liquid amoxicillin as a child successfully.    Tongue swells, throat does not close   Did it involve swelling of the face/tongue/throat, SOB, or low BP? No Did it involve sudden or severe rash/hives, skin peeling, or any reaction on the inside of your mouth or nose? Yes Did you need to seek medical attention at a hospital or doctor's  office? No When did it last happen?      4-5 years If all above answers are "NO", may proceed with cephalosporin use.    Latex, Natural Rubber Hives, Other (See Comments) and Rash  Other Hives, Other (See Comments), Rash and Swelling     GRASS GRASS    Shellfish Containing Products Hives and Other (See Comments)  Grass Pollen Unknown  Silver Other (See Comments) and Rash      Current Outpatient Medications on File Prior to Visit Medication Sig Dispense Refill  levETIRAcetam (KEPPRA) 500 MG tablet levetiracetam 500 mg tablet  TAKE 1 TABLET BY MOUTH THREE TIMES A DAY      LORazepam (ATIVAN) 0.5 MG tablet Take 1 tablet as needed for seizure.      nortriptyline (PAMELOR) 10 MG capsule nortriptyline 10 mg capsule  TAKE 2 CAPSULES EVERY NIGHT      buPROPion (WELLBUTRIN XL) 150 MG XL tablet TAKE 1 TABLET BY MOUTH EVERY 12 HOURS AS DIRECTED FOR 30 DAYS      lacosamide (VIMPAT) 50 mg tablet Take 1 tablet (50 mg total) by mouth      spironolactone (ALDACTONE) 50 MG tablet Take 1 tablet (50 mg total) by mouth once daily       No current facility-administered medications on file prior to visit.     Family History Family History Problem Relation Age of Onset  High blood pressure (Hypertension) Father    Hyperlipidemia (Elevated cholesterol) Father    Heart valve disease Father        Social History   Tobacco Use Smoking Status Never Smokeless Tobacco Never     Social History Social History    Socioeconomic History  Marital status: Single Tobacco Use  Smoking status: Never  Smokeless tobacco: Never Vaping Use  Vaping Use: Never used Substance and Sexual Activity  Alcohol use: Never  Drug use: Never      Objective:     Vitals:   12/03/21 1049 Pulse: 94 Temp: 36.8 C (98.3 F) Height: 165.1 cm (_0 )   There is no height or weight on file to calculate BMI.   Physical Exam Vitals reviewed.  Constitutional:      General: She is not in acute distress.     Appearance: Normal appearance.  HENT:     Head: Normocephalic and atraumatic.  Eyes:     General: No scleral icterus.    Conjunctiva/sclera: Conjunctivae normal.  Cardiovascular:     Rate and Rhythm: Normal rate and regular rhythm.     Heart sounds: No murmur heard. Pulmonary:     Effort: Pulmonary effort is normal. No respiratory distress.     Breath sounds: Normal breath sounds.  Chest:     Comments: Port-A-Cath in place on right upper chest wall with overlying erythema but no induration or fluctuance. Musculoskeletal:  General: No swelling or deformity. Normal range of motion.     Cervical back: Normal range of motion. No rigidity.  Skin:    General: Skin is warm and dry.  Neurological:     General: No focal deficit present.     Mental Status: She is alert and oriented to person, place, and time.  Psychiatric:        Mood and Affect: Mood normal.        Behavior: Behavior normal.        Thought Content: Thought content normal.                Assessment and Plan:    Diagnoses and all orders for this visit:   Port-A-Cath in place       This is a 30 year old female with need for long-term IV access who presents with Port-A-Cath dysfunction.  On review of her chart, it appears that the port draws back and flushes but the patient experiences significant pain when it is accessed and flushed, and does have some redness overlying the port today.  It is unclear why she is having these symptoms, as the erythema is not really consistent with cellulitis.  If the port was truly infected I would expect these symptoms to persist and worsen, but the erythema has only been intermittent.  I recommended relocating the port to a new site preferably on the left side as she is already had 2 ports on the right side.  I will plan to place this via the left subclavian vein to minimize any kinking of the catheter, and will place it as high as reasonably possible on the chest wall to avoid  interference with any future mastectomy.  I will also remove her current port at the time.  The patient agrees to proceed.  I offered her referral to one of our breast surgeons to discuss both replacement of the port and a prophylactic mastectomy, but she would like to go ahead and have her port replaced as soon as possible and discuss mastectomy later on.  We will proceed with port replacement within the next few weeks and I will later refer her to a breast surgeon at the time of her choosing.    She will be contacted to schedule an elective surgery date. Patient requested to bring her support person to the preop area with her for port placement due to a history of severe anxiety.  This has been allowed in the past including for recent surgery, so I discussed that this will likely also be allowed for her port placement.    No follow-ups on file.   Jasmeet Manton Awilda Metro, MD

## 2021-12-03 NOTE — H&P (Signed)
History of Present Illness: [name] is a 30 y.o. female who is seen today to discuss port replacement. She has a history of catamenial seizures and BRCA-2 mutation. She has previously undergone laparoscopic hysterectomy with BSO, and undergoes annual breast MRI to screen for breast cancer. She has significant anxiety and usual undergoes general anesthesia for her MRIs. She previously had a port placed by Dr. Lucia Gaskins in 2018 via the right IJ for a history of difficult IV access.  She reports that prior to that she had a port on the right upper chest wall that became infected and had to be removed.     The port placed in 2018 was working very well until about 5 months ago.  Since then she reports she has had significant pain when the port is accessed, and that there is often difficulty accessing and flushing the port.  Every few weeks she endorses redness and pain over the port, and currently has some redness at the site.  She denies any fevers and chills during these episodes, and says they do not seem to be related to use of the port.  She uses the port for infusions of seizure medications, as well as ER visits.  The frequency with which she uses it varies, but sometimes she needs it multiple times per month.   Today she brought up that she would also like to discuss a prophylactic mastectomy as she has a BRCA mutation, but she wishes to do this later on this year and would like to have her port replaced first.  She is here today with her therapist and support person [place].     Review of Systems: A complete review of systems was obtained from the patient.  I have reviewed this information and discussed as appropriate with the patient.  See HPI as well for other ROS.     Medical History: Past Medical History Past Medical History: Diagnosis Date  Anxiety    Asthma, unspecified asthma severity, unspecified whether complicated, unspecified whether persistent    BRCA2 gene mutation positive in  female    Seizures (CMS-HCC)        Patient Active Problem List Diagnosis  Port-A-Cath in place  Seizures (CMS-HCC)  BRCA2 positive     Past Surgical History Past Surgical History: Procedure Laterality Date  hysterectomy with bilateral salpingectomy   03/12/2021  LAPAROSCOPIC OOPHORECTOMY Bilateral 09/09/2021  APPENDECTOMY      CHOLECYSTECTOMY          Allergies Allergies Allergen Reactions  Cephalosporins Anaphylaxis, Hives, Rash, Shortness Of Breath and Swelling     REACTION: rash, throat swells shut    Midazolam Nausea And Vomiting, Other (See Comments) and Shortness Of Breath     Not effective at controlling seizure    Penicillins Anaphylaxis, Hives, Other (See Comments) and Rash     Did it involve swelling of the face/tongue/throat, SOB, or low BP? No Did it involve sudden or severe rash/hives, skin peeling, or any reaction on the inside of your mouth or nose? Yes Did you need to seek medical attention at a hospital or doctor's office? No When did it last happen?      4-5 years If all above answers are NO, may proceed with cephalosporin use. Patient experienced rash on chest and throat tightening that started within 20 min of taking the medication.  Reaction occurred 3.5 years ago.  She is not sure what medication she took (class: Penicillins), but states she has liquid amoxicillin as  a child successfully.    Patient experienced rash on chest and throat tightening that started within 20 min of taking the medication.  Reaction occurred 3.5 years ago.  She is not sure what medication she took (class: Penicillins), but states she has liquid amoxicillin as a child successfully.    Tongue swells, throat does not close   Did it involve swelling of the face/tongue/throat, SOB, or low BP? No Did it involve sudden or severe rash/hives, skin peeling, or any reaction on the inside of your mouth or nose? Yes Did you need to seek medical attention at a hospital or doctor's  office? No When did it last happen?      4-5 years If all above answers are "NO", may proceed with cephalosporin use.    Latex, Natural Rubber Hives, Other (See Comments) and Rash  Other Hives, Other (See Comments), Rash and Swelling     GRASS GRASS    Shellfish Containing Products Hives and Other (See Comments)  Grass Pollen Unknown  Silver Other (See Comments) and Rash      Current Outpatient Medications on File Prior to Visit Medication Sig Dispense Refill  levETIRAcetam (KEPPRA) 500 MG tablet levetiracetam 500 mg tablet  TAKE 1 TABLET BY MOUTH THREE TIMES A DAY      LORazepam (ATIVAN) 0.5 MG tablet Take 1 tablet as needed for seizure.      nortriptyline (PAMELOR) 10 MG capsule nortriptyline 10 mg capsule  TAKE 2 CAPSULES EVERY NIGHT      buPROPion (WELLBUTRIN XL) 150 MG XL tablet TAKE 1 TABLET BY MOUTH EVERY 12 HOURS AS DIRECTED FOR 30 DAYS      lacosamide (VIMPAT) 50 mg tablet Take 1 tablet (50 mg total) by mouth      spironolactone (ALDACTONE) 50 MG tablet Take 1 tablet (50 mg total) by mouth once daily       No current facility-administered medications on file prior to visit.     Family History Family History Problem Relation Age of Onset  High blood pressure (Hypertension) Father    Hyperlipidemia (Elevated cholesterol) Father    Heart valve disease Father        Social History   Tobacco Use Smoking Status Never Smokeless Tobacco Never     Social History Social History    Socioeconomic History  Marital status: Single Tobacco Use  Smoking status: Never  Smokeless tobacco: Never Vaping Use  Vaping Use: Never used Substance and Sexual Activity  Alcohol use: Never  Drug use: Never      Objective:     Vitals:   12/03/21 1049 Pulse: 94 Temp: 36.8 C (98.3 F) Height: 165.1 cm (_0 )   There is no height or weight on file to calculate BMI.   Physical Exam Vitals reviewed.  Constitutional:      General: She is not in acute distress.     Appearance: Normal appearance.  HENT:     Head: Normocephalic and atraumatic.  Eyes:     General: No scleral icterus.    Conjunctiva/sclera: Conjunctivae normal.  Cardiovascular:     Rate and Rhythm: Normal rate and regular rhythm.     Heart sounds: No murmur heard. Pulmonary:     Effort: Pulmonary effort is normal. No respiratory distress.     Breath sounds: Normal breath sounds.  Chest:     Comments: Port-A-Cath in place on right upper chest wall with overlying erythema but no induration or fluctuance. Musculoskeletal:  General: No swelling or deformity. Normal range of motion.     Cervical back: Normal range of motion. No rigidity.  Skin:    General: Skin is warm and dry.  Neurological:     General: No focal deficit present.     Mental Status: She is alert and oriented to person, place, and time.  Psychiatric:        Mood and Affect: Mood normal.        Behavior: Behavior normal.        Thought Content: Thought content normal.                Assessment and Plan:    Diagnoses and all orders for this visit:   Port-A-Cath in place       This is a 30 year old female with need for long-term IV access who presents with Port-A-Cath dysfunction.  On review of her chart, it appears that the port draws back and flushes but the patient experiences significant pain when it is accessed and flushed, and does have some redness overlying the port today.  It is unclear why she is having these symptoms, as the erythema is not really consistent with cellulitis.  If the port was truly infected I would expect these symptoms to persist and worsen, but the erythema has only been intermittent.  I recommended relocating the port to a new site preferably on the left side as she is already had 2 ports on the right side.  I will plan to place this via the left subclavian vein to minimize any kinking of the catheter, and will place it as high as reasonably possible on the chest wall to avoid  interference with any future mastectomy.  I will also remove her current port at the time.  The patient agrees to proceed.  I offered her referral to one of our breast surgeons to discuss both replacement of the port and a prophylactic mastectomy, but she would like to go ahead and have her port replaced as soon as possible and discuss mastectomy later on.  We will proceed with port replacement within the next few weeks and I will later refer her to a breast surgeon at the time of her choosing.    She will be contacted to schedule an elective surgery date. Patient requested to bring her support person to the preop area with her for port placement due to a history of severe anxiety.  This has been allowed in the past including for recent surgery, so I discussed that this will likely also be allowed for her port placement.    No follow-ups on file.   Braylyn Eye Awilda Metro, MD

## 2021-12-05 DIAGNOSIS — F840 Autistic disorder: Secondary | ICD-10-CM | POA: Diagnosis not present

## 2021-12-05 DIAGNOSIS — F4312 Post-traumatic stress disorder, chronic: Secondary | ICD-10-CM | POA: Diagnosis not present

## 2021-12-06 ENCOUNTER — Other Ambulatory Visit: Payer: Self-pay

## 2021-12-06 ENCOUNTER — Encounter (HOSPITAL_COMMUNITY): Payer: Self-pay | Admitting: Surgery

## 2021-12-06 ENCOUNTER — Telehealth: Payer: Self-pay | Admitting: Adult Health

## 2021-12-06 NOTE — Progress Notes (Addendum)
Patient has given weight over phone. Has eating disorder and prefers to not be weighed on day of surgery. Will also be coming with her therapist d/t PTSD.  Pam aware and has approved.

## 2021-12-06 NOTE — Progress Notes (Signed)
Anesthesia Chart Review: [name]  Case: [id number] Date/Time: 12/10/21 1247   Procedure: REMOVAL PORT-A-CATH AND REPLACEMENT   Anesthesia type: General   Pre-op diagnosis: PORT DYSFUNCTION   Location: [facility] OR ROOM 18 / [place] OR   Surgeons: [name], MD       DISCUSSION: Patient is a 30 year old female scheduled for the above procedure. She has a Port due to difficult IV access. It was placed on 12/04/17. Per Dr. Ayesha Rumpf notes, it was working well until about 5 months ago and has since been having significant pain when the port is accessed and some difficulty with flushing the port. There is periodic redness at the site without fever or chills. The port is sometimes used several times per month for seizure medication and ED visit. At times, she reported IV medication has been given through her port prior to PIV access for procedure under anesthesia.   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  She is s/p MRI bilateral breasts under anesthesia on 11/19/21. Results showed no evidence of malignancy. Dr. Ayesha Rumpf note say patient has a BRCA2 mutation and is considering prophylactic mastectomies in the future.   See anesthesia APP note by Karoline Caldwell, PA-C signed on 11/18/21. Regarding her seizure history, he wrote, "Patient has history of seizures starting at age 70.  She reportedly has prior diagnosis of myoclonic epilepsy with ragged red fibers by muscle biopsy at [facility] in 2012.   She is currently followed by Dr. Delice Lesch for this.  She primarily has complex partial seizures that last for ~30 seconds with staring, drooling, mild limb jerking and self terminate.  She is currently maintained on Keppra, Vimpat, and Fycompa.  She was last seen by Dr. Delice Lesch 10/10/2021 and at that time reported being satisfied with her seizure control."  He called and spoke with her to clarify seizure history and added, "She says that overall her seizures are stable compared to when she was last seen by Dr. Delice Lesch.  The pattern is variable, waxes and wanes.  She said recently she has had an increase in complex partial seizures due to insomnia secondary to recent right oophorectomy which induced menopause. She says he did not have any complications with that surgery on 09/09/2021.  She also had total laparoscopic hysterectomy 03/06/5572 without complication.  She reports that she typically has 2-4 complex partial seizures in PACU after any surgery lasting ~30 seconds each.  She states that overall her seizure frequency feels stable and she has not recently had any generalized tonic-clonic seizures."  She has PTSD and will have her therapist/support person [place] accompany her.    She wears a Dexcom for history of hypoglycemia.   She is a same day work-up, so labs and anesthesia team evaluation on the day of surgery.    VS: Ht $Remo'5\' 5"'tuDNz$  (1.651 m)    Wt 49.4 kg    LMP 02/11/2021    BMI 18.14 kg/m  BP Readings from Last 3 Encounters:  11/19/21 100/71  10/16/21 108/70  10/10/21 116/74   Pulse Readings from Last 3 Encounters:  11/19/21 Marland Kitchen)  51  10/16/21 65  10/10/21 73     PROVIDERS: Dorothyann Peng, NP is PCP  Ellouise Newer, MD is neurologist. Last visit 10/10/21. Six month follow-up planned. Normal awake EEG 08/17/09 and normal 24 hour EEG 09/09/09 per her note. - Cardiology evaluation by Jyl Heinz, MD in 2019 for syncope. Echo showed normal LVEF, no AS. She did not have an event  monitor.   LABS: For day of surgery as indicated. Normal ISTAT8 on 09/09/21.    IMAGES: CT Head/C-spine 07/24/21: IMPRESSION: 1. No evidence of acute intracranial abnormality. 2. No evidence of acute fracture or traumatic malalignment the cervical spine.  CTA chest 03/21/21: IMPRESSION: No evidence of pulmonary embolus. No acute cardiopulmonary disease.   EKG: 02/08/21: SB   CV: Echo 10/04/18: Study Conclusions  - Left ventricle: The cavity size was normal. Systolic function was    normal. The estimated ejection fraction was in the range of 60%    to 65%. Wall motion was normal; there were no regional wall    motion abnormalities. Left ventricular diastolic function    parameters were normal.  - Aortic valve: Transvalvular velocity was within the normal range.    There was no stenosis. There was no regurgitation.  - Mitral valve: Transvalvular velocity was within the normal range.    There was no evidence for stenosis. There was no regurgitation.  - Right ventricle: The cavity size was normal. Wall thickness was    normal. Systolic function was normal.  - Pulmonary arteries: Systolic pressure was within the normal    range. PA peak pressure: 20 mm Hg (S).    Past Medical History:  Diagnosis Date   Acne    Anemia    hx of   Anxiety    Asthma    Carpal tunnel syndrome    right   Complex partial seizures ([place])    last seizure 01-21-5169   Complication of anesthesia    slow to wake up, disoriented, hx of  mild seizure after surgery -2019 gallbladder    Coordination problem    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)    Migraines    Ovarian cyst    Right   Pneumonia 12/2018   hx of    PONV (postoperative nausea and vomiting)    PTSD (post-traumatic stress disorder)    PTSD (post-traumatic stress disorder)    Shoulder dislocation 09/2018   Left   Shoulder subluxation, left    Syncope    Tremor, unspecified    bilateral arms   Wears glasses    Wears hearing aid    bilateral    Past Surgical History:  Procedure Laterality Date   ABDOMINAL HYSTERECTOMY     CHOLECYSTECTOMY N/A 10/05/2018   Procedure: LAPAROSCOPIC CHOLECYSTECTOMY WITH INTRAOPERATIVE CHOLANGIOGRAM ERAS PATHWAY;  Surgeon: Alphonsa Overall, MD;  Location:  [facility];  Service: General;  Laterality: N/A;   CYSTOSCOPY N/A 03/12/2021   Procedure: CYSTOSCOPY;  Surgeon: Sherlyn Hay, DO;  Location: [place];  Service: Gynecology;  Laterality: N/A;   LAPAROSCOPIC APPENDECTOMY  11/26/2011   Procedure: APPENDECTOMY LAPAROSCOPIC;  Surgeon: Gayland Curry, MD;  Location: [place];  Service: General;  Laterality: N/A;   left shoulder manipulation     in er multilple times last done july 2021   PORTACATH PLACEMENT Right 08/28/2017   Procedure: POWER PORT PLACEMENT;  Surgeon: Alphonsa Overall, MD;  Location: [place];  Service: General;  Laterality: Right;   PORTACATH PLACEMENT N/A 12/04/2017   Procedure: INSERTION PORT-A-CATH;  Surgeon: Alphonsa Overall, MD;  Location: [place];  Service: General;  Laterality: N/A;   RADIOLOGY WITH ANESTHESIA Left 10/25/2019   Procedure: MRI  LEFT SHOLDER WITHOUT CONTRAST;  Surgeon: Radiologist, Medication, MD;  Location: [place];  Service: Radiology;  Laterality: Left;   RADIOLOGY WITH ANESTHESIA Bilateral 11/19/2021   Procedure: MRI WITH ANESTHESIA OF BILATERAL BREASTS WITH AND WITHOUT CONTRAST;  Surgeon: Radiologist, Medication, MD;  Location: [place];  Service: Radiology;  Laterality:  Bilateral;   REPAIR ANKLE LIGAMENT Left    x 3   TOTAL LAPAROSCOPIC HYSTERECTOMY WITH SALPINGECTOMY Bilateral 03/12/2021   Procedure: TOTAL LAPAROSCOPIC HYSTERECTOMY WITH SALPINGECTOMY;  Surgeon: Sherlyn Hay, DO;  Location: [place];  Service: Gynecology;  Laterality: Bilateral;    MEDICATIONS:  alteplase (CATHFLO ACTIVASE) injection 2 mg    Adapalene 0.3 % gel   albuterol (VENTOLIN HFA) 108 (90 Base) MCG/ACT inhaler   buPROPion (WELLBUTRIN XL) 150 MG 24 hr tablet   clindamycin (CLEOCIN T) 1 % lotion   Dapsone 5 % topical gel   diphenhydrAMINE (BENADRYL) 25 MG tablet   gabapentin (NEURONTIN) 300 MG capsule   Glucagon, rDNA, (GLUCAGON EMERGENCY) 1 MG KIT   Lacosamide (VIMPAT) 100 MG TABS   levETIRAcetam (KEPPRA) 500 MG tablet   LORazepam (ATIVAN) 0.5 MG tablet   Melatonin 5 MG CHEW   montelukast (SINGULAIR) 10 MG tablet   nortriptyline (PAMELOR) 10 MG capsule   Perampanel (FYCOMPA) 4 MG TABS   spironolactone (ALDACTONE) 50 MG tablet   Continuous Blood Gluc Sensor (DEXCOM G6 SENSOR) MISC   Continuous Blood Gluc Transmit (DEXCOM G6 TRANSMITTER) MISC   glucose monitoring kit (FREESTYLE) monitoring kit    Myra Gianotti, PA-C Surgical Short Stay/Anesthesiology [facility] Phone [phone] [facility] Phone [phone] 12/06/2021 3:22 PM

## 2021-12-06 NOTE — Telephone Encounter (Signed)
Pt is calling to let cory know she will have her Port -A - Cath will be removed on 12-10-2021 and replace with a new one at [place]. Pt is calling to see if she needs ekg or lab work

## 2021-12-06 NOTE — Telephone Encounter (Signed)
Spoke to pt and let her know no labs or ekg necessary for clearance.

## 2021-12-06 NOTE — Anesthesia Preprocedure Evaluation (Addendum)
Anesthesia Evaluation  Patient identified by MRN, date of birth, ID band Patient awake    Reviewed: Allergy & Precautions, NPO status , Patient's Chart, lab work & pertinent test results  History of Anesthesia Complications (+) PONV  Airway Mallampati: III  TM Distance: >3 FB Neck ROM: Full    Dental  (+) Chipped, Dental Advisory Given,    Pulmonary asthma ,    Pulmonary exam normal breath sounds clear to auscultation       Cardiovascular negative cardio ROS Normal cardiovascular exam Rhythm:Regular Rate:Normal  TTE 09/2018 EF 60-65%, no valvular abnormalities   Neuro/Psych  Headaches, Seizures - (seizures after anesthesia 2018, 2019, 2022, last seizure 2 days ago), Poorly Controlled,  PSYCHIATRIC DISORDERS Anxiety myoclonus epilepsy and ragged red fibers  Neuromuscular disease    GI/Hepatic Neg liver ROS, GERD  Controlled and Medicated,  Endo/Other  negative endocrine ROS  Renal/GU negative Renal ROS  negative genitourinary   Musculoskeletal negative musculoskeletal ROS (+)   Abdominal   Peds  Hematology negative hematology ROS (+)   Anesthesia Other Findings   Reproductive/Obstetrics                           Anesthesia Physical Anesthesia Plan  ASA: 3  Anesthesia Plan: General   Post-op Pain Management:    Induction: Intravenous  PONV Risk Score and Plan: 4 or greater and Ondansetron, Dexamethasone, Midazolam and TIVA  Airway Management Planned: LMA  Additional Equipment:   Intra-op Plan:   Post-operative Plan: Extubation in OR  Informed Consent: I have reviewed the patients History and Physical, chart, labs and discussed the procedure including the risks, benefits and alternatives for the proposed anesthesia with the patient or authorized representative who has indicated his/her understanding and acceptance.     Dental advisory given  Plan Discussed with:  CRNA  Anesthesia Plan Comments: ( )       Anesthesia Quick Evaluation

## 2021-12-06 NOTE — Progress Notes (Signed)
PCP - Joice Lofts @ [facility] Cardiologist - Not currently EKG - 02/08/21 ECHO - 10/04/18 Not diabetic but states she has hypoglycemia. Wears a  dexcom COVID TEST- NA  Anesthesia review:  yes. From [facility] Smith's note on 11/16/21 - Pt. Reports she has complex partial seizures.Seizures are increasing daily, last one 11/15/21 and had 4-5 that day.  Symptoms:eyes opened, drooling, making sounds, 1-2 limbs jerky, heart rate changes, disoriented, last about 30 seconds. 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 States last surgery while in PACU, had complex seizure  and received ativan.  Patient states last seizure one week ago only one at night while asleep.   -------------  SDW INSTRUCTIONS:  Your procedure is scheduled on Tuesday December 27th. Please report to [facility] Main Entrance "A" at 10:30 A.M., and check in at the Admitting office. Call this number if you have problems the morning of surgery: [phone]   Remember: Do not eat or drink anything after midnight the night before your surgery    Medications to take morning of surgery with a sip of water include: albuterol (VENTOLIN HFA) 108 (90 Base) MCG/ACT inhaler if needed  buPROPion (WELLBUTRIN XL) 150 MG 24 hr tablet levETIRAcetam (KEPPRA) 500 MG tablet LORazepam (ATIVAN) 0.5 MG tablet if needed  As of today, STOP taking any Aspirin (unless otherwise instructed by your surgeon), Aleve, Naproxen, Ibuprofen, Motrin, Advil, Goody's, BC's, all herbal medications, fish oil, and all vitamins.    The Morning of Surgery Do not wear jewelry, make-up or nail polish. Do not wear lotions, powders, or perfumes/colognes, or deodorant Do not bring valuables to the hospital. [facility] is not responsible for any belongings or valuables.  If you are a smoker, DO NOT Smoke 24 hours prior to surgery  If you wear a CPAP at night please bring your mask the morning of surgery   Remember that you must have  someone to transport you home after your surgery, and remain with you for 24 hours if you are discharged the same day.  Please bring cases for contacts, glasses, hearing aids, dentures or bridgework because it cannot be worn into surgery.   Patients discharged the day of surgery will not be allowed to drive home.   Please shower the NIGHT BEFORE/MORNING OF SURGERY (use antibacterial soap like DIAL soap if possible). Wear comfortable clothes the morning of surgery. Oral Hygiene is also important to reduce your risk of infection.  Remember - BRUSH YOUR TEETH THE MORNING OF SURGERY WITH YOUR REGULAR TOOTHPASTE  Patient denies shortness of breath, fever, cough and chest pain.

## 2021-12-07 ENCOUNTER — Other Ambulatory Visit: Payer: Self-pay

## 2021-12-07 ENCOUNTER — Emergency Department (HOSPITAL_BASED_OUTPATIENT_CLINIC_OR_DEPARTMENT_OTHER)
Admission: EM | Admit: 2021-12-07 | Discharge: 2021-12-07 | Disposition: A | Discharge status: Home / Self Care | Payer: BC Managed Care – PPO | Attending: Student | Admitting: Student

## 2021-12-07 ENCOUNTER — Encounter (HOSPITAL_BASED_OUTPATIENT_CLINIC_OR_DEPARTMENT_OTHER): Payer: Self-pay

## 2021-12-07 DIAGNOSIS — Z23 Encounter for immunization: Secondary | ICD-10-CM | POA: Insufficient documentation

## 2021-12-07 DIAGNOSIS — W260XXA Contact with knife, initial encounter: Secondary | ICD-10-CM | POA: Diagnosis not present

## 2021-12-07 DIAGNOSIS — Y93G3 Activity, cooking and baking: Secondary | ICD-10-CM | POA: Insufficient documentation

## 2021-12-07 DIAGNOSIS — Z91040 Latex allergy status: Secondary | ICD-10-CM | POA: Diagnosis not present

## 2021-12-07 DIAGNOSIS — S61411A Laceration without foreign body of right hand, initial encounter: Secondary | ICD-10-CM

## 2021-12-07 DIAGNOSIS — J45909 Unspecified asthma, uncomplicated: Secondary | ICD-10-CM | POA: Insufficient documentation

## 2021-12-07 DIAGNOSIS — S6991XA Unspecified injury of right wrist, hand and finger(s), initial encounter: Secondary | ICD-10-CM | POA: Diagnosis not present

## 2021-12-07 MED ORDER — LIDOCAINE-EPINEPHRINE (PF) 2 %-1:200000 IJ SOLN
10.0000 mL | Freq: Once | INTRAMUSCULAR | Status: AC
  Administered 2021-12-07: 10 mL
  Filled 2021-12-07: qty 20

## 2021-12-07 MED ORDER — TETANUS-DIPHTH-ACELL PERTUSSIS 5-2.5-18.5 LF-MCG/0.5 IM SUSY
0.5000 mL | PREFILLED_SYRINGE | Freq: Once | INTRAMUSCULAR | Status: AC
  Administered 2021-12-07: 22:00:00 0.5 mL via INTRAMUSCULAR
  Filled 2021-12-07: qty 0.5

## 2021-12-07 MED ORDER — DOXYCYCLINE HYCLATE 100 MG PO CAPS: 100.0000 mg | ORAL_CAPSULE | Freq: Two times a day (BID) | ORAL | 0 refills | Status: DC

## 2021-12-07 NOTE — ED Triage Notes (Signed)
Pt states she has a hx of myoclonic seizures in her hands. Pt states she was cutting up Christmas cookies and the knife cut the palm of her right hand, approx 2 cm. Unknown last tetanus.

## 2021-12-07 NOTE — ED Notes (Signed)
Right hand dressed with non adhered pad and wrapped with Kerlix.

## 2021-12-07 NOTE — ED Provider Notes (Signed)
[facility] DEPT Provider Note   CSN: [id number] Arrival date & time: 12/07/21  2054     History Chief Complaint  Patient presents with   Laceration    [name] is a 30 y.o. female with history of myoclonic seizures who presents to the emergency department complaining of a laceration to her right hand that occurred about 1 hour prior to arrival.  Patient states that she was making Christmas cookies, when she had a partial seizure involving twitching of her right hand that caused her to cut her palm.  Unknown last tetanus.  No other trauma noted.   Laceration     Past Medical History:  Diagnosis Date   Acne    Anemia    hx of   Anxiety    Asthma    Carpal tunnel syndrome    right   Complex partial seizures ([place])    last seizure 03-22-2499   Complication of anesthesia    slow to wake up, disoriented, hx of  mild seizure after surgery -2019 gallbladder    Coordination problem    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)    Migraines    Ovarian cyst    Right   Pneumonia 12/2018   hx of    PONV (postoperative nausea and vomiting)    PTSD (post-traumatic stress disorder)    PTSD (post-traumatic stress disorder)    Shoulder dislocation 09/2018   Left   Shoulder subluxation, left    Syncope    Tremor, unspecified    bilateral arms   Wears glasses    Wears hearing aid    bilateral    Patient Active Problem List   Diagnosis Date Noted   Dysmenorrhea 03/12/2021    Post-operative state 03/12/2021   Problem with vascular access 02/16/2020   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   Pneumomediastinum ([place]) 03/17/2019   Seizure ([place]) 10/05/2018   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   Syncope 05/21/2018   Seizures ([place]) 12/04/2017   Cellulitis of chest wall 10/07/2017   Infected venous access port, initial encounter 10/07/2017   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   Partial idiopathic epilepsy with seizures of localized onset, intractable, without status epilepticus ([place]) 07/12/2015   Hypoglycemia 06/12/2014   Migraine headache 06/29/2013   Asthma 11/27/2011   Muscle weakness of lower extremity 11/26/2011   Acute appendicitis with localized peritonitis 11/26/2011   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   Neuropathy, 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   Seizure disorder ([place]) 08/16/2009   HEARING LOSS, BILATERAL 05/25/2008    Past Surgical History:  Procedure Laterality Date   ABDOMINAL HYSTERECTOMY     CHOLECYSTECTOMY N/A 10/05/2018   Procedure: LAPAROSCOPIC CHOLECYSTECTOMY WITH INTRAOPERATIVE CHOLANGIOGRAM ERAS PATHWAY;  Surgeon: Alphonsa Overall, MD;  Location: [facility];  Service: General;  Laterality: N/A;   CYSTOSCOPY N/A 03/12/2021   Procedure: CYSTOSCOPY;  Surgeon: Sherlyn Hay, DO;  Location: [facility];  Service: Gynecology;  Laterality: N/A;   LAPAROSCOPIC APPENDECTOMY  11/26/2011   Procedure: APPENDECTOMY LAPAROSCOPIC;  Surgeon: Gayland Curry, MD;  Location: [facility];  Service: General;  Laterality: N/A;   left shoulder manipulation     in er multilple times last done july 2021   PORTACATH PLACEMENT Right 08/28/2017   Procedure: POWER PORT PLACEMENT;  Surgeon: Alphonsa Overall, MD;  Location: [facility];  Service: General;  Laterality: Right;   PORTACATH PLACEMENT N/A 12/04/2017   Procedure: INSERTION PORT-A-CATH;  Surgeon: Alphonsa Overall, MD;  Location: [facility];  Service: General;   Laterality: N/A;   RADIOLOGY WITH ANESTHESIA Left 10/25/2019   Procedure: MRI  LEFT SHOLDER WITHOUT CONTRAST;  Surgeon: Radiologist, Medication, MD;  Location: [facility];  Service: Radiology;  Laterality: Left;   RADIOLOGY WITH ANESTHESIA Bilateral 11/19/2021   Procedure: MRI WITH ANESTHESIA OF BILATERAL BREASTS WITH AND WITHOUT CONTRAST;  Surgeon: Radiologist, Medication, MD;  Location: [facility];  Service: Radiology;  Laterality: Bilateral;   REPAIR ANKLE LIGAMENT Left    x 3   TOTAL LAPAROSCOPIC HYSTERECTOMY WITH SALPINGECTOMY Bilateral 03/12/2021   Procedure: TOTAL LAPAROSCOPIC HYSTERECTOMY WITH SALPINGECTOMY;  Surgeon: Sherlyn Hay, DO;  Location: [facility];  Service: Gynecology;  Laterality: Bilateral;     OB History     Gravida  0   Para  0   Term  0   Preterm  0   AB  0   Living  0      SAB  0   IAB  0   Ectopic  0   Multiple  0   Live Births  0           Family History  Problem Relation Age of Onset   Polymyalgia rheumatica Mother    Heart disease Mother    Sudden Cardiac Death Father        long QT syndrome    Heart disease Maternal Grandmother    Breast cancer Maternal Grandmother    Heart disease Maternal Grandfather    Stroke Maternal Grandfather    Diabetes Maternal Grandfather    COPD Maternal Grandfather    Kidney failure Maternal Grandfather    Heart disease Paternal Grandmother    Heart disease Paternal Grandfather    Migraines Paternal Grandfather     Social History   Tobacco Use   Smoking status: Never   Smokeless tobacco: Never  Vaping Use   Vaping Use: Never used  Substance Use Topics   Alcohol use: Never   Drug use: Never    Home Medications Prior to Admission medications   Medication Sig Start Date End Date Taking? Authorizing Provider  doxycycline (VIBRAMYCIN) 100 MG capsule Take 1 capsule (100 mg total) by mouth 2 (two) times daily. 12/07/21  Yes Gillian Kluever T, PA-C  Adapalene 0.3 % gel Apply 1 application topically  daily as needed (acne). Alternate when run out    [provider]  albuterol (VENTOLIN HFA) 108 (90 Base) MCG/ACT inhaler Inhale 2 puffs into the lungs every 6 (six) hours as needed for wheezing or shortness of breath. Patient taking differently: Inhale 2 puffs into the lungs daily. 09/19/21   Nafziger, [name], NP  buPROPion (WELLBUTRIN XL) 150 MG 24 hr tablet Take 150 mg by mouth 2 (two) times daily. 09/27/21   [provider]  clindamycin (CLEOCIN T) 1 %  lotion Apply 1 application topically as needed (Acne). Apply to affected areas of the face 2 times a day 08/29/18   [provider]  Continuous Blood Gluc Sensor (DEXCOM G6 SENSOR) MISC USE TO MONITOR BLOOD SUGARS 07/05/21   Nafziger, [name], NP  Continuous Blood Gluc Transmit (DEXCOM G6 TRANSMITTER) MISC Use with dexcom receiver and sensor 07/03/21   Nafziger, [name], NP  Dapsone 5 % topical gel Apply 1 application topically daily as needed (Acne). 03/07/20   [provider]  diphenhydrAMINE (BENADRYL) 25 MG tablet Take 50 mg by mouth at bedtime.    [provider]  gabapentin (NEURONTIN) 300 MG capsule Take 1 capsule (300 mg total) by mouth at bedtime. Patient taking differently: Take 300 mg by mouth at bedtime as needed (Sleep). 10/16/21   Nafziger, [name], NP  Glucagon, rDNA, (GLUCAGON EMERGENCY) 1 MG KIT Use PRN for hypoglycemia 10/09/20   Dorothyann Peng, NP  glucose monitoring kit (FREESTYLE) monitoring kit 1 each by Does not apply route as needed for other. Free style libre on right arm    [provider]  Lacosamide (VIMPAT) 100 MG TABS Take 1 tablet (100 mg total) by mouth 2 (two) times daily. Patient taking differently: Take 100 mg by mouth at bedtime. 03/22/21   Cameron Sprang, MD  levETIRAcetam (KEPPRA) 500 MG tablet Take 1 tablet three times a day Patient taking differently: 500 mg 2 (two) times daily. 03/22/21   Cameron Sprang, MD  LORazepam (ATIVAN) 0.5 MG tablet Take 1 tablet as needed for  seizure. Patient taking differently: Take 0.5 mg by mouth as needed for seizure. 03/22/21   Cameron Sprang, MD  Melatonin 5 MG CHEW Chew 25 mg by mouth at bedtime. Gummies    [provider]  montelukast (SINGULAIR) 10 MG tablet Take 1 tablet (10 mg total) by mouth at bedtime. 09/19/21   Nafziger, [name], NP  nortriptyline (PAMELOR) 10 MG capsule Take 1 capsule (10 mg total) by mouth at bedtime. Patient taking differently: Take 10 mg by mouth at bedtime as needed (Migraine). 03/22/21   Cameron Sprang, MD  Perampanel (FYCOMPA) 4 MG TABS Take 2 tablets (8 mg total) by mouth every evening. Patient taking differently: Take 4 mg by mouth at bedtime. 03/22/21   Cameron Sprang, MD  spironolactone (ALDACTONE) 50 MG tablet Take 50 mg by mouth at bedtime. 10/10/21   [provider]    Allergies    Cefzil [cefprozil], Cephalosporins, Penicillins, Midazolam, Shellfish allergy, Morphine and related, Latex, Tegaderm ag mesh [silver], and Vancomycin  Review of Systems   Review of Systems  Skin:        Right hand wound  All other systems reviewed and are negative.  Physical Exam Updated Vital Signs BP 116/89 (BP Location: Left Arm)    Pulse (!) 54    Temp 98.1 F (36.7 C)    Resp 16    Ht 5' 5"  (1.651 m)    Wt 48.1 kg    LMP 02/11/2021    SpO2 100%    BMI 17.64 kg/m   Physical Exam Vitals and nursing note reviewed.  Constitutional:      Appearance: Normal appearance.  HENT:     Head: Normocephalic and atraumatic.  Eyes:     Conjunctiva/sclera: Conjunctivae normal.  Pulmonary:     Effort: Pulmonary effort is normal. No respiratory distress.  Musculoskeletal:     Comments: Full range of motion of the right hand and all right digits.  Good capillary refill.  Sensation intact.  Skin:    General: Skin is warm and dry.     Comments: Approximately 1 cm linear incision to the palmar aspect of the right hand, with minimal bleeding  Neurological:     Mental Status: She is alert.   Psychiatric:        Mood and Affect: Mood normal.        Behavior: Behavior normal.    ED Results / Procedures / Treatments   Labs (all labs ordered are listed, but only abnormal results are displayed) Labs Reviewed - No data to display  EKG None  Radiology No results found.  Procedures .Marland KitchenLaceration Repair  Date/Time: 12/07/2021 9:34 PM Performed by: Kateri Plummer, PA-C Authorized by: Kateri Plummer, PA-C   Consent:    Consent obtained:  Verbal   Consent given by:  Patient Anesthesia:    Anesthesia method:  Local infiltration   Local anesthetic:  Lidocaine 2% WITH epi Laceration details:    Location:  Hand   Hand location:  R palm   Length (cm):  1 Treatment:    Area cleansed with:  Saline   Irrigation method:  Pressure wash Skin repair:    Repair method:  Sutures   Suture size:  5-0   Suture material:  Nylon   Suture technique:  Simple interrupted   Number of sutures:  5 Repair type:    Repair type:  Simple Post-procedure details:    Dressing:  Non-adherent dressing   Procedure completion:  Tolerated   Medications Ordered in ED Medications  Tdap (BOOSTRIX) injection 0.5 mL (0.5 mLs Intramuscular Given 12/07/21 2145)  lidocaine-EPINEPHrine (XYLOCAINE W/EPI) 2 %-1:200000 (PF) injection 10 mL (10 mLs Infiltration Given by Other 12/07/21 2144)    ED Course  I have reviewed the triage vital signs and the nursing notes.  Pertinent labs & imaging results that were available during my care of the patient were reviewed by me and considered in my medical decision making (see chart for details).    MDM Rules/Calculators/A&P                          Patient is a 30 year old female with history of seizures who presents emergency department complaining of right hand laceration after having a partial myoclonic seizure while making Christmas cookies.  My exam patient has an approximately 1 cm linear laceration over the right palm, with minimal bleeding.  Pressure irrigation performed. Wound explored and base of wound visualized in a bloodless field without evidence of foreign body.  Laceration occurred < 8 hours prior to repair which was well tolerated.  Tdap updated.  Pt has history of poor healing with incisions, that may affect normal wound healing. Pt discharged with antibiotics.  Discussed suture home care with patient and answered questions. Pt to follow-up for wound check and suture removal in 10 days; they are to return to the ED sooner for signs of infection. Pt is hemodynamically stable with no complaints prior to dc.    Final Clinical Impression(s) / ED Diagnoses Final diagnoses:  Laceration of right hand without foreign body, initial encounter    Rx / DC Orders ED Discharge Orders          Ordered    doxycycline (VIBRAMYCIN) 100 MG capsule  2 times daily        12/07/21 2250             Marshe Shrestha T,  PA-C 12/08/21 [place], MD 12/08/21 1530

## 2021-12-07 NOTE — ED Notes (Signed)
Wound washed and wrapped in triage. Pt states she has hx of infections, in concerned for infection in her hand

## 2021-12-07 NOTE — Discharge Instructions (Addendum)
You were seen in the emergency department for hand laceration.  We have stitched this and I'm putting you on antibiotics twice daily for the next 5 days. I want you to have the stitches removed in 10 days, you can have this done at any doctor's office, urgent care, or ER.  Continue to monitor how you're doing and return to the ER for new or worsening symptoms such as redness, tenderness, drainage of pus, or fevers.   It has been a pleasure seeing and caring for you today and I hope you start feeling better soon!

## 2021-12-09 NOTE — Progress Notes (Signed)
Pt called to inform that she cut her Right hand on 12/07/21 and required sutures. Pt denies any redness, swelling, or drainage from the site. Pt is scheduled for Port Removal surgery by Dr. Zenia Resides on 12/10/21.

## 2021-12-10 ENCOUNTER — Ambulatory Visit (HOSPITAL_COMMUNITY): Payer: BC Managed Care – PPO

## 2021-12-10 ENCOUNTER — Ambulatory Visit (HOSPITAL_COMMUNITY): Payer: BC Managed Care – PPO | Admitting: Vascular Surgery

## 2021-12-10 ENCOUNTER — Other Ambulatory Visit: Payer: Self-pay

## 2021-12-10 ENCOUNTER — Ambulatory Visit (HOSPITAL_COMMUNITY)
Admission: RE | Admit: 2021-12-10 | Discharge: 2021-12-10 | Disposition: A | Discharge status: Home / Self Care | Payer: BC Managed Care – PPO | Attending: Surgery | Admitting: Surgery

## 2021-12-10 ENCOUNTER — Encounter (HOSPITAL_COMMUNITY)
Admission: RE | Disposition: A | Discharge status: Home / Self Care | Payer: Self-pay | Source: Home / Self Care | Attending: Surgery

## 2021-12-10 ENCOUNTER — Encounter (HOSPITAL_COMMUNITY): Payer: Self-pay | Admitting: Surgery

## 2021-12-10 DIAGNOSIS — G40019 Localization-related (focal) (partial) idiopathic epilepsy and epileptic syndromes with seizures of localized onset, intractable, without status epilepticus: Secondary | ICD-10-CM | POA: Diagnosis not present

## 2021-12-10 DIAGNOSIS — Z452 Encounter for adjustment and management of vascular access device: Secondary | ICD-10-CM | POA: Diagnosis not present

## 2021-12-10 DIAGNOSIS — R918 Other nonspecific abnormal finding of lung field: Secondary | ICD-10-CM | POA: Diagnosis not present

## 2021-12-10 DIAGNOSIS — Z1501 Genetic susceptibility to malignant neoplasm of breast: Secondary | ICD-10-CM | POA: Diagnosis not present

## 2021-12-10 DIAGNOSIS — K219 Gastro-esophageal reflux disease without esophagitis: Secondary | ICD-10-CM | POA: Insufficient documentation

## 2021-12-10 DIAGNOSIS — F419 Anxiety disorder, unspecified: Secondary | ICD-10-CM | POA: Insufficient documentation

## 2021-12-10 DIAGNOSIS — G40409 Other generalized epilepsy and epileptic syndromes, not intractable, without status epilepticus: Secondary | ICD-10-CM | POA: Diagnosis not present

## 2021-12-10 DIAGNOSIS — G709 Myoneural disorder, unspecified: Secondary | ICD-10-CM | POA: Diagnosis not present

## 2021-12-10 DIAGNOSIS — J45909 Unspecified asthma, uncomplicated: Secondary | ICD-10-CM | POA: Diagnosis not present

## 2021-12-10 DIAGNOSIS — F840 Autistic disorder: Secondary | ICD-10-CM | POA: Diagnosis not present

## 2021-12-10 DIAGNOSIS — Z95828 Presence of other vascular implants and grafts: Secondary | ICD-10-CM

## 2021-12-10 DIAGNOSIS — F4312 Post-traumatic stress disorder, chronic: Secondary | ICD-10-CM | POA: Diagnosis not present

## 2021-12-10 DIAGNOSIS — T82898A Other specified complication of vascular prosthetic devices, implants and grafts, initial encounter: Secondary | ICD-10-CM | POA: Diagnosis not present

## 2021-12-10 HISTORY — PX: PORT-A-CATH REMOVAL: SHX5289

## 2021-12-10 LAB — BASIC METABOLIC PANEL
Anion gap: 9 (ref 5–15)
BUN: 8 mg/dL (ref 6–20)
CO2: 22 mmol/L (ref 22–32)
Calcium: 9.5 mg/dL (ref 8.9–10.3)
Chloride: 105 mmol/L (ref 98–111)
Creatinine, Ser: 0.96 mg/dL (ref 0.44–1.00)
GFR, Estimated: >60 mL/min (ref >60)
Glucose, Bld: 95 mg/dL (ref 70–99)
Potassium: 3.5 mmol/L (ref 3.5–5.1)
Sodium: 136 mmol/L (ref 135–145)

## 2021-12-10 LAB — CBC
HCT: 40.5 % (ref 36.0–46.0)
Hemoglobin: 13.6 g/dL (ref 12.0–15.0)
MCH: 29.1 pg (ref 26.0–34.0)
MCHC: 33.6 g/dL (ref 30.0–36.0)
MCV: 86.7 fL (ref 80.0–100.0)
Platelets: 354 10*3/uL (ref 150–400)
RBC: 4.67 MIL/uL (ref 3.87–5.11)
RDW: 12.2 % (ref 11.5–15.5)
WBC: 6.2 10*3/uL (ref 4.0–10.5)
nRBC: 0 % (ref 0.0–0.2)

## 2021-12-10 LAB — GLUCOSE, CAPILLARY: Glucose-Capillary: 104 mg/dL — ABNORMAL HIGH (ref 70–99)

## 2021-12-10 SURGERY — REMOVAL PORT-A-CATH
Anesthesia: General | Site: Chest

## 2021-12-10 MED ORDER — FENTANYL CITRATE (PF) 250 MCG/5ML IJ SOLN
INTRAMUSCULAR | Status: AC
  Filled 2021-12-10: qty 5

## 2021-12-10 MED ORDER — HEPARIN 6000 UNIT IRRIGATION SOLUTION
Status: DC | PRN
  Administered 2021-12-10: 1

## 2021-12-10 MED ORDER — HEPARIN 6000 UNIT IRRIGATION SOLUTION
Status: AC
  Filled 2021-12-10: qty 500

## 2021-12-10 MED ORDER — MIDAZOLAM HCL 2 MG/2ML IJ SOLN
INTRAMUSCULAR | Status: DC | PRN
  Administered 2021-12-10 (×2): 2 mg via INTRAVENOUS

## 2021-12-10 MED ORDER — PHENYLEPHRINE HCL-NACL 20-0.9 MG/250ML-% IV SOLN
INTRAVENOUS | Status: AC
  Filled 2021-12-10: qty 250

## 2021-12-10 MED ORDER — LIDOCAINE 2% (20 MG/ML) 5 ML SYRINGE
INTRAMUSCULAR | Status: DC | PRN
  Administered 2021-12-10: 40 mg via INTRAVENOUS

## 2021-12-10 MED ORDER — LEVETIRACETAM IN NACL 500 MG/100ML IV SOLN
500.0000 mg | Freq: Once | INTRAVENOUS | Status: AC
  Administered 2021-12-10: 13:00:00 500 mg via INTRAVENOUS
  Filled 2021-12-10: qty 100

## 2021-12-10 MED ORDER — PROPOFOL 500 MG/50ML IV EMUL
INTRAVENOUS | Status: DC | PRN
  Administered 2021-12-10: 100 ug/kg/min via INTRAVENOUS

## 2021-12-10 MED ORDER — MIDAZOLAM HCL 2 MG/2ML IJ SOLN
INTRAMUSCULAR | Status: AC
  Filled 2021-12-10: qty 2

## 2021-12-10 MED ORDER — EPHEDRINE 5 MG/ML INJ
INTRAVENOUS | Status: AC
  Filled 2021-12-10: qty 5

## 2021-12-10 MED ORDER — AMISULPRIDE (ANTIEMETIC) 5 MG/2ML IV SOLN
10.0000 mg | Freq: Once | INTRAVENOUS | Status: AC
  Administered 2021-12-10: 15:00:00 10 mg via INTRAVENOUS

## 2021-12-10 MED ORDER — DEXMEDETOMIDINE (PRECEDEX) IN NS 20 MCG/5ML (4 MCG/ML) IV SYRINGE
PREFILLED_SYRINGE | INTRAVENOUS | Status: DC | PRN
  Administered 2021-12-10: 12 ug via INTRAVENOUS
  Administered 2021-12-10: 8 ug via INTRAVENOUS

## 2021-12-10 MED ORDER — LIDOCAINE 2% (20 MG/ML) 5 ML SYRINGE
INTRAMUSCULAR | Status: AC
  Filled 2021-12-10: qty 5

## 2021-12-10 MED ORDER — ACETAMINOPHEN 500 MG PO TABS
1000.0000 mg | ORAL_TABLET | Freq: Once | ORAL | Status: AC
  Administered 2021-12-10: 11:00:00 1000 mg via ORAL
  Filled 2021-12-10: qty 2

## 2021-12-10 MED ORDER — ONDANSETRON HCL 4 MG/2ML IJ SOLN
INTRAMUSCULAR | Status: DC | PRN
  Administered 2021-12-10: 4 mg via INTRAVENOUS

## 2021-12-10 MED ORDER — DEXAMETHASONE SODIUM PHOSPHATE 10 MG/ML IJ SOLN
INTRAMUSCULAR | Status: DC | PRN
  Administered 2021-12-10: 5 mg via INTRAVENOUS

## 2021-12-10 MED ORDER — LACTATED RINGERS IV SOLN: INTRAVENOUS | Status: DC

## 2021-12-10 MED ORDER — TRAMADOL HCL 50 MG PO TABS: 50.0000 mg | ORAL_TABLET | Freq: Four times a day (QID) | ORAL | 0 refills | Status: AC | PRN

## 2021-12-10 MED ORDER — BUPIVACAINE-EPINEPHRINE 0.25% -1:200000 IJ SOLN
INTRAMUSCULAR | Status: DC | PRN
  Administered 2021-12-10: 3 mL

## 2021-12-10 MED ORDER — AMISULPRIDE (ANTIEMETIC) 5 MG/2ML IV SOLN
INTRAVENOUS | Status: AC
  Filled 2021-12-10: qty 4

## 2021-12-10 MED ORDER — PHENYLEPHRINE 40 MCG/ML (10ML) SYRINGE FOR IV PUSH (FOR BLOOD PRESSURE SUPPORT)
PREFILLED_SYRINGE | INTRAVENOUS | Status: AC
  Filled 2021-12-10: qty 10

## 2021-12-10 MED ORDER — CLINDAMYCIN PHOSPHATE 900 MG/50ML IV SOLN
900.0000 mg | INTRAVENOUS | Status: AC
  Administered 2021-12-10: 14:00:00 900 mg via INTRAVENOUS
  Filled 2021-12-10: qty 50

## 2021-12-10 MED ORDER — DEXAMETHASONE SODIUM PHOSPHATE 10 MG/ML IJ SOLN
INTRAMUSCULAR | Status: AC
  Filled 2021-12-10: qty 1

## 2021-12-10 MED ORDER — PHENYLEPHRINE HCL-NACL 20-0.9 MG/250ML-% IV SOLN
INTRAVENOUS | Status: DC | PRN
Start: 2021-12-10 — End: 2021-12-10
  Administered 2021-12-10: 40 ug/min via INTRAVENOUS

## 2021-12-10 MED ORDER — BUPIVACAINE-EPINEPHRINE (PF) 0.25% -1:200000 IJ SOLN
INTRAMUSCULAR | Status: AC
  Filled 2021-12-10: qty 30

## 2021-12-10 MED ORDER — CHLORHEXIDINE GLUCONATE 0.12 % MT SOLN
15.0000 mL | Freq: Once | OROMUCOSAL | Status: AC
  Administered 2021-12-10: 11:00:00 15 mL via OROMUCOSAL
  Filled 2021-12-10: qty 15

## 2021-12-10 MED ORDER — ACETAMINOPHEN 325 MG PO TABS: 650.0000 mg | ORAL_TABLET | Freq: Four times a day (QID) | ORAL | Status: AC | PRN

## 2021-12-10 MED ORDER — 0.9 % SODIUM CHLORIDE (POUR BTL) OPTIME
TOPICAL | Status: DC | PRN
  Administered 2021-12-10: 14:00:00 1000 mL

## 2021-12-10 MED ORDER — FENTANYL CITRATE (PF) 250 MCG/5ML IJ SOLN
INTRAMUSCULAR | Status: DC | PRN
  Administered 2021-12-10: 25 ug via INTRAVENOUS
  Administered 2021-12-10: 50 ug via INTRAVENOUS
  Administered 2021-12-10: 25 ug via INTRAVENOUS

## 2021-12-10 MED ORDER — DEXTROSE 50 % IV SOLN
INTRAVENOUS | Status: AC
  Filled 2021-12-10: qty 50

## 2021-12-10 MED ORDER — DEXMEDETOMIDINE (PRECEDEX) IN NS 20 MCG/5ML (4 MCG/ML) IV SYRINGE
PREFILLED_SYRINGE | INTRAVENOUS | Status: AC
  Filled 2021-12-10: qty 5

## 2021-12-10 MED ORDER — PROPOFOL 1000 MG/100ML IV EMUL
INTRAVENOUS | Status: AC
  Filled 2021-12-10: qty 100

## 2021-12-10 MED ORDER — MIDAZOLAM HCL 2 MG/2ML IJ SOLN
2.0000 mg | Freq: Once | INTRAMUSCULAR | Status: AC
Start: 2021-12-10 — End: 2021-12-10
  Administered 2021-12-10: 16:00:00 2 mg via INTRAVENOUS

## 2021-12-10 MED ORDER — PHENYLEPHRINE 40 MCG/ML (10ML) SYRINGE FOR IV PUSH (FOR BLOOD PRESSURE SUPPORT)
PREFILLED_SYRINGE | INTRAVENOUS | Status: DC | PRN
  Administered 2021-12-10: 120 ug via INTRAVENOUS
  Administered 2021-12-10: 80 ug via INTRAVENOUS

## 2021-12-10 MED ORDER — FENTANYL CITRATE (PF) 100 MCG/2ML IJ SOLN
25.0000 ug | INTRAMUSCULAR | Status: DC | PRN
  Administered 2021-12-10 (×4): 25 ug via INTRAVENOUS

## 2021-12-10 MED ORDER — ORAL CARE MOUTH RINSE: 15.0000 mL | Freq: Once | OROMUCOSAL | Status: AC

## 2021-12-10 MED ORDER — PROPOFOL 10 MG/ML IV BOLUS
INTRAVENOUS | Status: DC | PRN
  Administered 2021-12-10: 160 mg via INTRAVENOUS
  Administered 2021-12-10: 40 mg via INTRAVENOUS

## 2021-12-10 MED ORDER — HEPARIN SOD (PORK) LOCK FLUSH 100 UNIT/ML IV SOLN
INTRAVENOUS | Status: AC
  Filled 2021-12-10: qty 5

## 2021-12-10 MED ORDER — EPHEDRINE SULFATE-NACL 50-0.9 MG/10ML-% IV SOSY
PREFILLED_SYRINGE | INTRAVENOUS | Status: DC | PRN
  Administered 2021-12-10: 5 mg via INTRAVENOUS

## 2021-12-10 MED ORDER — HEPARIN SOD (PORK) LOCK FLUSH 100 UNIT/ML IV SOLN
INTRAVENOUS | Status: DC | PRN
  Administered 2021-12-10: 500 [IU] via INTRAVENOUS

## 2021-12-10 MED ORDER — FENTANYL CITRATE (PF) 100 MCG/2ML IJ SOLN
INTRAMUSCULAR | Status: AC
  Filled 2021-12-10: qty 2

## 2021-12-10 MED ORDER — ONDANSETRON HCL 4 MG/2ML IJ SOLN
INTRAMUSCULAR | Status: AC
  Filled 2021-12-10: qty 2

## 2021-12-10 SURGICAL SUPPLY — 41 items
ADH SKN CLS APL DERMABOND .7 (GAUZE/BANDAGES/DRESSINGS) ×2
APL PRP STRL LF DISP 70% ISPRP (MISCELLANEOUS) ×1
BAG COUNTER SPONGE SURGICOUNT (BAG) ×2 IMPLANT
BAG DECANTER FOR FLEXI CONT (MISCELLANEOUS) ×3 IMPLANT
BAG SPNG CNTER NS LX DISP (BAG) ×1
BAG SURGICOUNT SPONGE COUNTING (BAG) ×1
CHLORAPREP W/TINT 26 (MISCELLANEOUS) ×3 IMPLANT
COVER SURGICAL LIGHT HANDLE (MISCELLANEOUS) ×3 IMPLANT
COVER TRANSDUCER ULTRASND GEL (DISPOSABLE) IMPLANT
DERMABOND ADVANCED (GAUZE/BANDAGES/DRESSINGS) ×4
DERMABOND ADVANCED .7 DNX12 (GAUZE/BANDAGES/DRESSINGS) ×1 IMPLANT
DRAPE C-ARM 42X120 X-RAY (DRAPES) ×3 IMPLANT
DRAPE LAPAROSCOPIC ABDOMINAL (DRAPES) ×3 IMPLANT
ELECT CAUTERY BLADE 6.4 (BLADE) ×3 IMPLANT
ELECT REM PT RETURN 9FT ADLT (ELECTROSURGICAL) ×3
ELECTRODE REM PT RTRN 9FT ADLT (ELECTROSURGICAL) ×1 IMPLANT
GEL ULTRASOUND 20GR AQUASONIC (MISCELLANEOUS) IMPLANT
GLOVE SURG SYN 5.5 (GLOVE) ×3 IMPLANT
GLOVE SURG SYN 5.5 PF PI (GLOVE) ×1 IMPLANT
GLOVE SURG UNDER POLY LF SZ6 (GLOVE) ×3 IMPLANT
GOWN STRL REUS W/ TWL LRG LVL3 (GOWN DISPOSABLE) ×2 IMPLANT
GOWN STRL REUS W/TWL LRG LVL3 (GOWN DISPOSABLE) ×6
INTRODUCER COOK 11FR (CATHETERS) IMPLANT
KIT BASIN OR (CUSTOM PROCEDURE TRAY) ×3 IMPLANT
KIT PORT POWER 8FR ISP CVUE (Port) ×2 IMPLANT
KIT TURNOVER KIT B (KITS) ×3 IMPLANT
NS IRRIG 1000ML POUR BTL (IV SOLUTION) ×3 IMPLANT
PAD ARMBOARD 7.5X6 YLW CONV (MISCELLANEOUS) ×3 IMPLANT
PENCIL BUTTON HOLSTER BLD 10FT (ELECTRODE) ×3 IMPLANT
POSITIONER HEAD DONUT 9IN (MISCELLANEOUS) ×3 IMPLANT
SET INTRODUCER 12FR PACEMAKER (INTRODUCER) IMPLANT
SET SHEATH INTRODUCER 10FR (MISCELLANEOUS) IMPLANT
SHEATH COOK PEEL AWAY SET 9F (SHEATH) IMPLANT
SUT MNCRL AB 4-0 PS2 18 (SUTURE) ×3 IMPLANT
SUT PROLENE 2 0 SH DA (SUTURE) ×6 IMPLANT
SUT VIC AB 3-0 SH 27 (SUTURE) ×3
SUT VIC AB 3-0 SH 27X BRD (SUTURE) ×1 IMPLANT
SYR 5ML LUER SLIP (SYRINGE) ×3 IMPLANT
TOWEL GREEN STERILE (TOWEL DISPOSABLE) ×3 IMPLANT
TOWEL GREEN STERILE FF (TOWEL DISPOSABLE) ×3 IMPLANT
TRAY LAPAROSCOPIC MC (CUSTOM PROCEDURE TRAY) ×3 IMPLANT

## 2021-12-10 NOTE — Anesthesia Procedure Notes (Signed)
Procedure Name: LMA Insertion Date/Time: 12/10/2021 1:34 PM Performed by: Ardyth Harps, CRNA Pre-anesthesia Checklist: Patient identified, Emergency Drugs available, Suction available and Patient being monitored Patient Re-evaluated:Patient Re-evaluated prior to induction Oxygen Delivery Method: Circle System Utilized Preoxygenation: Pre-oxygenation with 100% oxygen Induction Type: IV induction Ventilation: Mask ventilation without difficulty LMA: LMA inserted LMA Size: 4.0 Number of attempts: 1 Airway Equipment and Method: Bite block Placement Confirmation: positive ETCO2 Tube secured with: Tape Dental Injury: Teeth and Oropharynx as per pre-operative assessment

## 2021-12-10 NOTE — Interval H&P Note (Signed)
History and Physical Interval Note:  12/10/2021 1:03 PM  [name]  has presented today for surgery, with the diagnosis of PORT DYSFUNCTION.  The various methods of treatment have been discussed with the patient and family. After consideration of risks, benefits and other options for treatment, the patient has consented to  Procedure(s): REMOVAL PORT-A-CATH AND REPLACEMENT (N/A) as a surgical intervention.  The patient's history has been reviewed, patient examined, no change in status, stable for surgery.  I have reviewed the patient's chart and labs.  Questions were answered to the patient's satisfaction.     [name]

## 2021-12-10 NOTE — Interval H&P Note (Signed)
History and Physical Interval Note:  12/10/2021 1:02 PM  [name]  has presented today for surgery, with the diagnosis of PORT DYSFUNCTION.  The various methods of treatment have been discussed with the patient and family. After consideration of risks, benefits and other options for treatment, the patient has consented to  Procedure(s): REMOVAL PORT-A-CATH AND REPLACEMENT (N/A) as a surgical intervention.  The patient's history has been reviewed, patient examined, no change in status, stable for surgery.  I have reviewed the patient's chart and labs.  Questions were answered to the patient's satisfaction.  Plan for CXR in PACU and discharge home.   [name]

## 2021-12-10 NOTE — Progress Notes (Signed)
Patient is scheduled for surgery today for revision of portacath. She has a history of severe anxiety/PTSD and in the past has been allowed to have a support person accompany her in the pre- and post-op areas for procedures. Please allow her counselor [name] to accompany her today.

## 2021-12-10 NOTE — Discharge Instructions (Addendum)
SURGERY DISCHARGE INSTRUCTIONS: PORT-A-CATH PLACEMENT  Activity You may resume your usual activities as tolerated Ok to shower in 24 hours after surgery, but do not bathe or submerge incisions underwater. Do not drive while taking narcotic pain medication.  Wound Care Your incision is covered with skin glue called Dermabond. This will peel off on its own over time. You may shower and allow warm soapy water to run over your incisions. Gently pat dry. Do not submerge your incision underwater. Monitor your incision for any new redness, tenderness, or drainage. You may start using your port in 7 days. Do not apply EMLA cream directly over the Dermabond (skin glue).  When to Call us: Fever greater than 100.5 New redness, drainage, or swelling at incision site Severe pain, nausea, or vomiting Shortness of breath, difficulty breathing   Follow-up You have an appointment scheduled with Dr. Zenia Resides on December 31, 2021 at 10:00am. This will be at the [facility] Surgery office at [address], [place], [place], [place]. Please arrive at least 15 minutes prior to your scheduled appointment time.  For questions or concerns, please call the office at [phone].

## 2021-12-10 NOTE — H&P (View-Only) (Signed)
Patient is scheduled for surgery today for revision of portacath. She has a history of severe anxiety/PTSD and in the past has been allowed to have a support person accompany her in the pre- and post-op areas for procedures. Please allow her counselor Altha Harm to accompany her today.

## 2021-12-10 NOTE — Transfer of Care (Signed)
Immediate Anesthesia Transfer of Care Note  Patient: [name]  Procedure(s) Performed: REMOVAL PORT-A-CATH AND REPLACEMENT (Chest)  Patient Location: PACU  Anesthesia Type:General  Level of Consciousness: drowsy  Airway & Oxygen Therapy: Patient Spontanous Breathing and Patient connected to face mask oxygen  Post-op Assessment: Report given to RN and Post -op Vital signs reviewed and stable  Post vital signs: Reviewed and stable  Last Vitals:  Vitals Value Taken Time  BP 101/55 12/10/21 1444  Temp    Pulse 83 12/10/21 1447  Resp 28 12/10/21 1447  SpO2 100 % 12/10/21 1447  Vitals shown include unvalidated device data.  Last Pain:  Vitals:   12/10/21 1115  TempSrc:   PainSc: 0-No pain         Complications: No notable events documented.

## 2021-12-10 NOTE — Progress Notes (Addendum)
At 1755, patient had just been transitioned to Phase II and had been unhooked from the monitor and changed into her clothes with assistance from therapist Margreta Journey. VSS, pain level 4/10, no nausea or dizziness. At 1610, After sitting up to get into the wheelchair, patient began seizing. This lasted approx. 30 seconds. I called Dr. Cherene Altes during the episode, as I was informed by the CRNA that he was the anesthesiologist with that case. I then tried Dr. Lanetta Inch. I then called my preceptor Susa Day, who called Dr. Lissa Hoard. He was at bedside within two minutes. We placed the patient back on the pulse oximeter, which was WDL. Her Dexcom showed CBG remained within 100-120. The patient was coming back to baseline, then had another episode about a minute later that lasted 5 minutes ([id number]). Dr. Lissa Hoard gave verbal order of 2mg  Versed approx. 3 minutes into the event. We administered. Seizure activity ended approx 2 minutes after administration. Patient then had another episode that lasted about one minute. We had also called Dr. Zenia Resides, who spoke with Dr. Lissa Hoard. After consideration of patient's history of seizure activity in PACU and patient's severe anxiety even with support persons present, both Dr. Zenia Resides and Dr. Lissa Hoard decided to monitor patient for an hour after last episode (1625) and if no further seizure activity or concerns, patient could be discharged home. I monitored the patient, who became more alert and continually expressed her desire to go home. Her mother and therapist at bedside, also stated that it was normal for her to have seizures after surgery and that they did not think she need to be admitted, or that she would do well with admission due to her anxiety. An hour after last seizure activity, patient had not had any further events, returned to baseline, VSS, and Dr. Lissa Hoard came to the bedside and said that the patient could be discharged home. I called Dr. Zenia Resides to let her know, and  she also was comfortable with patient being discharged home into care of her mother.

## 2021-12-10 NOTE — Op Note (Signed)
Date: 12/10/21  Patient: [name] MRN: [id number]  Preoperative Diagnosis: Port-a-cath dysfunction Postoperative Diagnosis: Same  Procedure: Removal of right internal jugular portacath Placement of a left subclavian portacath  Surgeon: Michaelle Birks, MD  EBL: Minimal  Anesthesia: General LMA  Specimens: None  Indications: Ms. [name] is a 30 year old female with a history of a seizure disorder who requires frequent intravenous infusions, for which she has a port in place.  She has recently been having frequent pain at the port site with some overlying skin erythema.  She desired to have the port replaced.  She has already had two ports on the right side, thus the decision was made to place a new port on the left side.  Findings: 8-Fr single-lumen power port placed via the left subclavian vein under fluoroscopic guidance.  Previous right IJ port was removed.  Procedure details: Informed consent was obtained in the preoperative area prior to the procedure. The patient was brought to the operating room and placed on the table in the supine position.  General anesthesia was induced and appropriate lines and drains were placed for intraoperative monitoring. Perioperative antibiotics were administered per SCIP guidelines. The neck and chest were prepped and draped in the usual sterile fashion. A pre-procedure timeout was taken verifying patient identity, surgical site and procedure to be performed.  The patient was placed in Trendelenberg and the left subclavian vein was accessed with a large-bore needle. A guidewire was inserted and advanced, and position in the SVC was confirmed fluoroscopically. The needle was removed and the wire was clipped to the drapes to secure its position.   A skin incision was made over the previous right-sided port, using the prior surgical scar.  The subcutaneous tissue was divided with cautery and the capsule surrounding the port was opened.  Blunt dissection and  cautery were used to free the port from the capsule, and the port and catheter were removed intact.  Manual pressure was held on the neck over the venous insertion site.  Next, a small skin incision was made around the wire exit site on the left chest wall and a subcutaneous pocket was created with cautery. A new 8-Fr port and catheter were then flushed and brought onto the field. Three 2-0 prolene sutures were used to secure the port in the subcutaneous pocket to the pectoralis fascia, but the sutures were not tied down.  The catheter was then measured using fluoro - it was placed over the skin adjacent to the guidewire, and marked externally at the cavoatrial junction. The catheter was then cut at this location, which was at 18cm. The dilator and sheath were then advanced over the guidewire under fluoroscopic guidance, and the wire and dilator were removed. The end of the catheter was inserted through the sheath and advanced, and the sheath was peeled away. The port was then accessed with a Huber needle, and blood was aspirated and the port was flushed with heparinized saline. A final fluoroscopic image confirmed appropriate position of the catheter tip within the SVC, without kinking of the catheter. The prolene sutures were tied down. A final flush of 500 units heparin (100 units/mL) was given via the port.   The skin at both incisions was closed with a deep dermal layer of interrupted 3-0 Vicryl suture, followed by a running subcuticular 4-0 monocryl suture. Dermabond was applied.  The patient tolerated the procedure with no apparent complications. All counts were correct x2 at the end of the procedure. The patient was extubated  and taken to PACU in stable condition.  Michaelle Birks, MD 12/10/21 2:46 PM

## 2021-12-11 ENCOUNTER — Encounter (HOSPITAL_COMMUNITY): Payer: Self-pay | Admitting: Surgery

## 2021-12-11 NOTE — Anesthesia Postprocedure Evaluation (Signed)
Anesthesia Post Note  Patient: Chameka Mcmullen  Procedure(s) Performed: REMOVAL PORT-A-CATH AND REPLACEMENT (Chest)     Patient location during evaluation: PACU Anesthesia Type: General Level of consciousness: sedated and patient cooperative Pain management: pain level controlled Vital Signs Assessment: post-procedure vital signs reviewed and stable Respiratory status: spontaneous breathing Cardiovascular status: stable Anesthetic complications: no Comments: Called to beside for pt having a seizure. When I arrived, she was recovering from brief seizure, but then began to have a second and continued to seize. Midazolam given, and seizure lasted ~5 minutes. She began to recover, but then had a 3rd brief seizure  (<1 min). I called Zenia Resides and discussed admission if she continued to seize. The pts mother also arrived at bedside at this time and said this was typical of her course postoperatively. We monitored her for >1 hour and she returned to baseline. Patient, mother and therapist at bedside were all adamant that she would do better at home and that she would be monitored closely and wished for her to be discharged. Dr. Zenia Resides agreed with this plan. Pt. Was discharged without issue.    No notable events documented.  Last Vitals:  Vitals:   12/10/21 1700 12/10/21 1715  BP: 104/73 100/62  Pulse: 69 64  Resp:  19  Temp:  37 C  SpO2: 96% 94%    Last Pain:  Vitals:   12/10/21 1715  TempSrc:   PainSc: [place]

## 2021-12-17 ENCOUNTER — Ambulatory Visit (INDEPENDENT_AMBULATORY_CARE_PROVIDER_SITE_OTHER): Payer: BC Managed Care – PPO | Admitting: Adult Health

## 2021-12-17 ENCOUNTER — Encounter: Payer: Self-pay | Admitting: Adult Health

## 2021-12-17 VITALS — BP 110/60 | HR 60 | Temp 98.3°F | Ht 65.0 in

## 2021-12-17 DIAGNOSIS — F419 Anxiety disorder, unspecified: Secondary | ICD-10-CM | POA: Diagnosis not present

## 2021-12-17 DIAGNOSIS — Z4802 Encounter for removal of sutures: Secondary | ICD-10-CM | POA: Diagnosis not present
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
## 2021-12-18 DIAGNOSIS — F4312 Post-traumatic stress disorder, chronic: Secondary | ICD-10-CM | POA: Diagnosis not present

## 2021-12-18 DIAGNOSIS — F840 Autistic disorder: Secondary | ICD-10-CM | POA: Diagnosis not present

## 2021-12-21 DIAGNOSIS — F840 Autistic disorder: Secondary | ICD-10-CM | POA: Diagnosis not present

## 2021-12-23 DIAGNOSIS — F840 Autistic disorder: Secondary | ICD-10-CM | POA: Diagnosis not present

## 2021-12-24 DIAGNOSIS — F840 Autistic disorder: Secondary | ICD-10-CM | POA: Diagnosis not present

## 2021-12-24 DIAGNOSIS — F4312 Post-traumatic stress disorder, chronic: Secondary | ICD-10-CM | POA: Diagnosis not present

## 2021-12-26 ENCOUNTER — Encounter: Payer: Self-pay | Admitting: Adult Health

## 2021-12-26 ENCOUNTER — Telehealth: Payer: Self-pay | Admitting: Adult Health

## 2021-12-26 DIAGNOSIS — F840 Autistic disorder: Secondary | ICD-10-CM | POA: Diagnosis not present

## 2021-12-26 DIAGNOSIS — N3091 Cystitis, unspecified with hematuria: Secondary | ICD-10-CM | POA: Diagnosis not present

## 2021-12-26 NOTE — Telephone Encounter (Signed)
Pt has sent [name]. Pt is having symptoms of UTI burning when urinating and tinge of blood in urine and pelvic pain. Pt said Annette normally will sent in abx . Pt was offer an appt  [id number] - [place], [place] [address] Phone:  [phone]  Fax:  [phone]

## 2021-12-27 NOTE — Telephone Encounter (Signed)
FYI

## 2021-12-27 NOTE — Telephone Encounter (Signed)
Please see Mychart message.

## 2021-12-31 DIAGNOSIS — F840 Autistic disorder: Secondary | ICD-10-CM | POA: Diagnosis not present

## 2021-12-31 DIAGNOSIS — F4312 Post-traumatic stress disorder, chronic: Secondary | ICD-10-CM | POA: Diagnosis not present

## 2022-01-03 DIAGNOSIS — F840 Autistic disorder: Secondary | ICD-10-CM | POA: Diagnosis not present

## 2022-01-07 DIAGNOSIS — F4312 Post-traumatic stress disorder, chronic: Secondary | ICD-10-CM | POA: Diagnosis not present

## 2022-01-07 DIAGNOSIS — F840 Autistic disorder: Secondary | ICD-10-CM | POA: Diagnosis not present

## 2022-01-07 DIAGNOSIS — F410 Panic disorder [episodic paroxysmal anxiety] without agoraphobia: Secondary | ICD-10-CM | POA: Diagnosis not present

## 2022-01-09 ENCOUNTER — Encounter: Payer: Self-pay | Admitting: Adult Health

## 2022-01-09 DIAGNOSIS — F840 Autistic disorder: Secondary | ICD-10-CM | POA: Diagnosis not present

## 2022-01-13 DIAGNOSIS — F840 Autistic disorder: Secondary | ICD-10-CM | POA: Diagnosis not present

## 2022-01-13 NOTE — Telephone Encounter (Signed)
Please advise 

## 2022-01-14 DIAGNOSIS — F4312 Post-traumatic stress disorder, chronic: Secondary | ICD-10-CM | POA: Diagnosis not present

## 2022-01-14 DIAGNOSIS — F840 Autistic disorder: Secondary | ICD-10-CM | POA: Diagnosis not present

## 2022-01-16 DIAGNOSIS — F840 Autistic disorder: Secondary | ICD-10-CM | POA: Diagnosis not present

## 2022-01-21 DIAGNOSIS — F840 Autistic disorder: Secondary | ICD-10-CM | POA: Diagnosis not present

## 2022-01-21 DIAGNOSIS — F4312 Post-traumatic stress disorder, chronic: Secondary | ICD-10-CM | POA: Diagnosis not present

## 2022-01-25 DIAGNOSIS — F840 Autistic disorder: Secondary | ICD-10-CM | POA: Diagnosis not present

## 2022-01-28 DIAGNOSIS — F4312 Post-traumatic stress disorder, chronic: Secondary | ICD-10-CM | POA: Diagnosis not present

## 2022-01-28 DIAGNOSIS — F840 Autistic disorder: Secondary | ICD-10-CM | POA: Diagnosis not present

## 2022-01-29 DIAGNOSIS — F840 Autistic disorder: Secondary | ICD-10-CM | POA: Diagnosis not present

## 2022-01-29 DIAGNOSIS — F4312 Post-traumatic stress disorder, chronic: Secondary | ICD-10-CM | POA: Diagnosis not present

## 2022-01-30 ENCOUNTER — Encounter (HOSPITAL_COMMUNITY): Payer: BC Managed Care – PPO

## 2022-01-30 DIAGNOSIS — F4312 Post-traumatic stress disorder, chronic: Secondary | ICD-10-CM | POA: Diagnosis not present

## 2022-01-30 DIAGNOSIS — F840 Autistic disorder: Secondary | ICD-10-CM | POA: Diagnosis not present

## 2022-02-04 DIAGNOSIS — F840 Autistic disorder: Secondary | ICD-10-CM | POA: Diagnosis not present

## 2022-02-04 DIAGNOSIS — F410 Panic disorder [episodic paroxysmal anxiety] without agoraphobia: Secondary | ICD-10-CM | POA: Diagnosis not present

## 2022-02-04 DIAGNOSIS — F4312 Post-traumatic stress disorder, chronic: Secondary | ICD-10-CM | POA: Diagnosis not present

## 2022-02-05 ENCOUNTER — Non-Acute Institutional Stay (HOSPITAL_COMMUNITY)
Admission: RE | Admit: 2022-02-05 | Discharge: 2022-02-05 | Disposition: A | Discharge status: Home / Self Care | Payer: BC Managed Care – PPO | Source: Ambulatory Visit | Attending: Internal Medicine | Admitting: Internal Medicine

## 2022-02-05 ENCOUNTER — Other Ambulatory Visit: Payer: Self-pay

## 2022-02-05 DIAGNOSIS — F4312 Post-traumatic stress disorder, chronic: Secondary | ICD-10-CM | POA: Diagnosis not present

## 2022-02-05 DIAGNOSIS — G40009 Localization-related (focal) (partial) idiopathic epilepsy and epileptic syndromes with seizures of localized onset, not intractable, without status epilepticus: Secondary | ICD-10-CM | POA: Insufficient documentation

## 2022-02-05 DIAGNOSIS — F840 Autistic disorder: Secondary | ICD-10-CM | POA: Diagnosis not present

## 2022-02-05 MED ORDER — SODIUM CHLORIDE 0.9% FLUSH
10.0000 mL | INTRAVENOUS | Status: AC | PRN
  Administered 2022-02-05: 10 mL

## 2022-02-05 MED ORDER — HEPARIN SOD (PORK) LOCK FLUSH 100 UNIT/ML IV SOLN
500.0000 [IU] | INTRAVENOUS | Status: AC | PRN
  Administered 2022-02-05: 500 [IU]

## 2022-02-05 MED ORDER — HEPARIN SOD (PORK) LOCK FLUSH 100 UNIT/ML IV SOLN: 250.0000 [IU] | INTRAVENOUS | Status: DC | PRN

## 2022-02-05 NOTE — Progress Notes (Signed)
°  Provider: Dorothyann Peng NP     Procedure: Port-a-cath flush     Note: Patient's PAC accessed, flushed with 10 cc 0.9% sodium chloride and heparin. Blood return noted. PAC de-accessed and site covered with band aid. Patient tolerated well. Pt declined AVS. Instructed pt to schedule monthly port flush appointment at front desk prior to leaving, verbalized understanding.  Alert, oriented and ambulatory at discharge accompanied by caregiver.

## 2022-02-06 ENCOUNTER — Telehealth (INDEPENDENT_AMBULATORY_CARE_PROVIDER_SITE_OTHER): Payer: BC Managed Care – PPO | Admitting: Adult Health

## 2022-02-06 DIAGNOSIS — Z803 Family history of malignant neoplasm of breast: Secondary | ICD-10-CM | POA: Diagnosis not present

## 2022-02-06 DIAGNOSIS — F840 Autistic disorder: Secondary | ICD-10-CM | POA: Diagnosis not present

## 2022-02-06 NOTE — Progress Notes (Signed)
Virtual Visit via Video Note  I connected with [name] on 02/06/22 at  4:00 PM EST by a video enabled telemedicine application and verified that I am speaking with the correct person using two identifiers.  Location patient: home Location provider:work or home office Persons participating in the virtual visit: patient, provider  I discussed the limitations of evaluation and management by telemedicine and the availability of in person appointments. The patient expressed understanding and agreed to proceed.   HPI: She is meeting with her surgeon for prophylactic bilateral mastectomies due to strong family history of breast cancer and ovarian cancer on her mom side with BRCA2 gene mutation.  She is good to be meeting with her surgeon early next week to review the procedure.  She was speaking with her neurologist who she saw in or gone who recommended a few things for the surgery and she wants to get my take on these items.  Not recommended that she have drains placed due to frequent infections after surgery due to immunocompromise status.  It was recommended that she have a skin flap done instead of drains. Wants to know if it is reasonable that they save her nipples during the surgery since she does not have breast cancer To go on prophylactic antibiotics She does not want to have reconstruction done due to concern of allergy to the implant   ROS: See pertinent positives and negatives per HPI.  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)    Migraines    Ovarian cyst    Right   Pneumonia 12/2018   hx of    PONV (postoperative nausea and vomiting)    PTSD (post-traumatic stress disorder)    PTSD (post-traumatic stress disorder)    Shoulder dislocation 09/2018   Left   Shoulder subluxation, left    Syncope    Tremor, unspecified    bilateral arms   Wears glasses    Wears hearing aid    bilateral    Past Surgical History:  Procedure Laterality Date   ABDOMINAL HYSTERECTOMY     CHOLECYSTECTOMY N/A 10/05/2018   Procedure: LAPAROSCOPIC CHOLECYSTECTOMY WITH INTRAOPERATIVE CHOLANGIOGRAM ERAS PATHWAY;  Surgeon: Alphonsa Overall, MD;  Location: [facility];  Service: General;  Laterality: N/A;   CYSTOSCOPY N/A 03/12/2021   Procedure: Consuela Mimes;  Surgeon: Sherlyn Hay, DO;  Location: [place];  Service: Gynecology;  Laterality: N/A;  LAPAROSCOPIC APPENDECTOMY  11/26/2011   Procedure: APPENDECTOMY LAPAROSCOPIC;  Surgeon: Gayland Curry, MD;  Location: [place];  Service: General;  Laterality: N/A;   left shoulder manipulation     in er multilple times last done july 2021   [facility] REMOVAL N/A 12/10/2021   Procedure: REMOVAL PORT-A-CATH AND REPLACEMENT;  Surgeon: [name], MD;  Location: [place];  Service: General;  Laterality: N/A;   PORTACATH PLACEMENT Right 08/28/2017   Procedure: POWER PORT PLACEMENT;  Surgeon: Alphonsa Overall, MD;  Location: [place];  Service: General;  Laterality: Right;   PORTACATH PLACEMENT N/A 12/04/2017   Procedure: INSERTION PORT-A-CATH;  Surgeon: Alphonsa Overall, MD;  Location: [place];  Service: General;  Laterality: N/A;    RADIOLOGY WITH ANESTHESIA Left 10/25/2019   Procedure: MRI  LEFT SHOLDER WITHOUT CONTRAST;  Surgeon: Radiologist, Medication, MD;  Location: [place];  Service: Radiology;  Laterality: Left;   RADIOLOGY WITH ANESTHESIA Bilateral 11/19/2021   Procedure: MRI WITH ANESTHESIA OF BILATERAL BREASTS WITH AND WITHOUT CONTRAST;  Surgeon: Radiologist, Medication, MD;  Location: [place];  Service: Radiology;  Laterality: Bilateral;   REPAIR ANKLE LIGAMENT Left    x 3   TOTAL LAPAROSCOPIC HYSTERECTOMY WITH SALPINGECTOMY Bilateral 03/12/2021   Procedure: TOTAL LAPAROSCOPIC HYSTERECTOMY WITH SALPINGECTOMY;  Surgeon: Sherlyn Hay, DO;  Location: [place];  Service: Gynecology;  Laterality: Bilateral;    Family History  Problem Relation Age of Onset   Polymyalgia rheumatica Mother    Heart disease Mother    Sudden Cardiac Death Father        long QT syndrome    Heart disease Maternal Grandmother    Breast cancer Maternal Grandmother    Heart disease Maternal Grandfather    Stroke Maternal Grandfather    Diabetes Maternal Grandfather    COPD Maternal Grandfather    Kidney failure Maternal Grandfather    Heart disease Paternal Grandmother    Heart disease Paternal Grandfather    Migraines Paternal Grandfather        Current Outpatient Medications:    acetaminophen (TYLENOL) 325 MG tablet, Take 2 tablets (650 mg total) by mouth every 6 (six) hours as needed (pain)., Disp: , Rfl:    Adapalene 0.3 % gel, Apply 1 application topically daily as needed (acne). Alternate when run out, Disp: , Rfl:    albuterol (VENTOLIN HFA) 108 (90 Base) MCG/ACT inhaler, Inhale 2 puffs into the lungs every 6 (six) hours as needed for wheezing or shortness of breath. (Patient taking differently: Inhale 2 puffs into the lungs daily.), Disp: 6.7 g, Rfl: 3   buPROPion (WELLBUTRIN XL) 150 MG 24 hr tablet, Take 150 mg by mouth 2 (two) times daily., Disp: , Rfl:    clindamycin (CLEOCIN T) 1 % lotion, Apply 1 application  topically as needed (Acne). Apply to affected areas of the face 2 times a day, Disp: , Rfl: 1   Continuous Blood Gluc Sensor (DEXCOM G6 SENSOR) MISC, USE TO MONITOR BLOOD SUGARS, Disp: 3 each, Rfl: 12   Continuous Blood Gluc Transmit (DEXCOM G6 TRANSMITTER) MISC, Use with dexcom receiver and sensor, Disp: 1 each, Rfl: 3   Dapsone 5 % topical gel, Apply 1 application topically daily as needed (Acne)., Disp: , Rfl:    diphenhydrAMINE (BENADRYL) 25 MG tablet, Take 50 mg by mouth at bedtime., Disp: , Rfl:    doxycycline (VIBRAMYCIN) 100 MG capsule, Take 1 capsule (100 mg total) by mouth 2 (two) times daily., Disp: 20 capsule, Rfl: 0  gabapentin (NEURONTIN) 300 MG capsule, Take 1 capsule (300 mg total) by mouth at bedtime. (Patient taking differently: Take 300 mg by mouth at bedtime as needed (Sleep).), Disp: 90 capsule, Rfl: 0   Glucagon, rDNA, (GLUCAGON EMERGENCY) 1 MG KIT, Use PRN for hypoglycemia, Disp: 1 kit, Rfl: 3   glucose monitoring kit (FREESTYLE) monitoring kit, 1 each by Does not apply route as needed for other. Free style libre on right arm, Disp: , Rfl:    Lacosamide (VIMPAT) 100 MG TABS, Take 1 tablet (100 mg total) by mouth 2 (two) times daily. (Patient taking differently: Take 100 mg by mouth at bedtime.), Disp: 60 tablet, Rfl: 5   levETIRAcetam (KEPPRA) 500 MG tablet, Take 1 tablet three times a day (Patient taking differently: 500 mg 2 (two) times daily.), Disp: 90 tablet, Rfl: 11   LORazepam (ATIVAN) 0.5 MG tablet, Take 1 tablet as needed for seizure. (Patient taking differently: Take 0.5 mg by mouth as needed for seizure.), Disp: 10 tablet, Rfl: 5   Melatonin 5 MG CHEW, Chew 25 mg by mouth at bedtime. Gummies, Disp: , Rfl:    montelukast (SINGULAIR) 10 MG tablet, Take 1 tablet (10 mg total) by mouth at bedtime., Disp: 90 tablet, Rfl: 3   nortriptyline (PAMELOR) 10 MG capsule, Take 1 capsule (10 mg total) by mouth at bedtime. (Patient taking differently: Take 10 mg by mouth at bedtime  as needed (Migraine).), Disp: 90 capsule, Rfl: 3   Perampanel (FYCOMPA) 4 MG TABS, Take 2 tablets (8 mg total) by mouth every evening. (Patient taking differently: Take 4 mg by mouth at bedtime.), Disp: 60 tablet, Rfl: 5   spironolactone (ALDACTONE) 50 MG tablet, Take 50 mg by mouth at bedtime., Disp: , Rfl:   Current Facility-Administered Medications:    alteplase (CATHFLO ACTIVASE) injection 2 mg, 2 mg, Intracatheter, Once, Momina Hunton, [name], NP  EXAM:  VITALS per patient if applicable:  GENERAL: alert, oriented, appears well and in no acute distress  HEENT: atraumatic, conjunttiva clear, no obvious abnormalities on inspection of external nose and ears  NECK: normal movements of the head and neck  LUNGS: on inspection no signs of respiratory distress, breathing rate appears normal, no obvious gross SOB, gasping or wheezing  CV: no obvious cyanosis  MS: moves all visible extremities without noticeable abnormality  PSYCH/NEURO: pleasant and cooperative, no obvious depression or anxiety, speech and thought processing grossly intact  ASSESSMENT AND PLAN:  Discussed the following assessment and plan:  1. Family history of breast cancer in female -I do agree with these recommendations.  Advised to bring these up to her surgeon next week.      I discussed the assessment and treatment plan with the patient. The patient was provided an opportunity to ask questions and all were answered. The patient agreed with the plan and demonstrated an understanding of the instructions.   The patient was advised to call back or seek an in-person evaluation if the symptoms worsen or if the condition fails to improve as anticipated.   Dorothyann Peng, NP

## 2022-02-09 ENCOUNTER — Encounter: Payer: Self-pay | Admitting: Adult Health

## 2022-02-10 ENCOUNTER — Other Ambulatory Visit: Payer: Self-pay | Admitting: General Surgery

## 2022-02-10 DIAGNOSIS — R030 Elevated blood-pressure reading, without diagnosis of hypertension: Secondary | ICD-10-CM | POA: Diagnosis not present

## 2022-02-10 DIAGNOSIS — Z1501 Genetic susceptibility to malignant neoplasm of breast: Secondary | ICD-10-CM | POA: Diagnosis not present

## 2022-02-10 DIAGNOSIS — G40019 Localization-related (focal) (partial) idiopathic epilepsy and epileptic syndromes with seizures of localized onset, intractable, without status epilepticus: Secondary | ICD-10-CM | POA: Diagnosis not present

## 2022-02-10 DIAGNOSIS — E8842 MERRF syndrome: Secondary | ICD-10-CM | POA: Diagnosis not present

## 2022-02-10 DIAGNOSIS — F840 Autistic disorder: Secondary | ICD-10-CM | POA: Diagnosis not present

## 2022-02-10 DIAGNOSIS — F4312 Post-traumatic stress disorder, chronic: Secondary | ICD-10-CM | POA: Diagnosis not present

## 2022-02-11 ENCOUNTER — Encounter: Payer: Self-pay | Admitting: Adult Health

## 2022-02-11 ENCOUNTER — Other Ambulatory Visit: Payer: Self-pay | Admitting: Adult Health

## 2022-02-11 DIAGNOSIS — F4312 Post-traumatic stress disorder, chronic: Secondary | ICD-10-CM | POA: Diagnosis not present

## 2022-02-11 DIAGNOSIS — F840 Autistic disorder: Secondary | ICD-10-CM | POA: Diagnosis not present

## 2022-02-11 MED ORDER — DEXCOM G7 SENSOR MISC: 1.0000 | 11 refills | Status: DC

## 2022-02-11 NOTE — Telephone Encounter (Signed)
FYI

## 2022-02-11 NOTE — Telephone Encounter (Signed)
Please advise 

## 2022-02-18 DIAGNOSIS — F4312 Post-traumatic stress disorder, chronic: Secondary | ICD-10-CM | POA: Diagnosis not present

## 2022-02-18 DIAGNOSIS — F840 Autistic disorder: Secondary | ICD-10-CM | POA: Diagnosis not present

## 2022-02-19 DIAGNOSIS — F840 Autistic disorder: Secondary | ICD-10-CM | POA: Diagnosis not present

## 2022-02-20 DIAGNOSIS — F840 Autistic disorder: Secondary | ICD-10-CM | POA: Diagnosis not present

## 2022-02-20 NOTE — Pre-Procedure Instructions (Signed)
Surgical Instructions ? ? ? Your procedure is scheduled on Tuesday, February 25, 2022 at 7:30 AM. ? Report to [facility] Main Entrance "A" at 5:30 A.M., then check in with the Admitting office. ? Call this number if you have problems the morning of surgery: ? [phone] ? ? If you have any questions prior to your surgery date call [phone]: Open Monday-Friday 8am-4pm ? ? ? Remember: ? Do not eat after midnight the night before your surgery ? ?You may drink clear liquids until 4:30 AM the morning of your surgery.   ?Clear liquids allowed are: Water, Non-Citrus Juices (without pulp), Carbonated Beverages, Clear Tea, Black Coffee Only (NO MILK, CREAM OR POWDERED CREAMER of any kind), and Gatorade. ?  ? Take these medicines the morning of surgery with A SIP OF WATER: ? ?buPROPion (WELLBUTRIN XL)  ?busPIRone (BUSPAR) ? ?IF NEEDED: ?acetaminophen (TYLENOL) ?albuterol (VENTOLIN HFA) ?LORazepam (ATIVAN) ? ?Please bring all inhalers with you the day of surgery.  ? ? ?As of today, STOP taking any Aspirin (unless otherwise instructed by your surgeon) Aleve, Naproxen, Ibuprofen, Motrin, Advil, Goody's, BC's, all herbal medications, fish oil, and all vitamins. ?         ?           ?Do NOT Smoke (Tobacco/Vaping) for 24 hours prior to your procedure. ? ?If you use a CPAP at night, you may bring your mask/headgear for your overnight stay. ?  ?Contacts, glasses, piercing's, hearing aid's, dentures or partials may not be worn into surgery, please bring cases for these belongings.  ?  ?For patients admitted to the hospital, discharge time will be determined by your treatment team. ?  ?Patients discharged the day of surgery will not be allowed to drive home, and someone needs to stay with them for 24 hours. ? ?NO VISITORS WILL BE ALLOWED IN PRE-OP WHERE PATIENTS ARE PREPPED FOR SURGERY.  ONLY 1 SUPPORT PERSON MAY BE PRESENT IN THE WAITING ROOM WHILE YOU ARE IN SURGERY.  IF YOU ARE TO BE ADMITTED, ONCE YOU ARE IN YOUR ROOM YOU WILL  BE ALLOWED TWO (2) VISITORS. (1) VISITOR MAY STAY OVERNIGHT BUT MUST ARRIVE TO THE ROOM BY 8pm.  Minor children may have two parents present. Special consideration for safety and communication needs will be reviewed on a case by case basis. ? ? ?Special instructions:   ?[place]- Preparing For Surgery ? ?Before surgery, you can play an important role. Because skin is not sterile, your skin needs to be as free of germs as possible. You can reduce the number of germs on your skin by washing with CHG (chlorahexidine gluconate) Soap before surgery.  CHG is an antiseptic cleaner which kills germs and bonds with the skin to continue killing germs even after washing.   ? ?Oral Hygiene is also important to reduce your risk of infection.  Remember - BRUSH YOUR TEETH THE MORNING OF SURGERY WITH YOUR REGULAR TOOTHPASTE ? ?Please do not use if you have an allergy to CHG or antibacterial soaps. If your skin becomes reddened/irritated stop using the CHG.  ?Do not shave (including legs and underarms) for at least 48 hours prior to first CHG shower. It is OK to shave your face. ? ?Please follow these instructions carefully. ?  ?Shower the NIGHT BEFORE SURGERY and the MORNING OF SURGERY ? ?If you chose to wash your hair, wash your hair first as usual with your normal shampoo. ? ?After you shampoo, rinse your hair and body thoroughly to  remove the shampoo. ? ?Use CHG Soap as you would any other liquid soap. You can apply CHG directly to the skin and wash gently with a scrungie or a clean washcloth.  ? ?Apply the CHG Soap to your body ONLY FROM THE NECK DOWN.  Do not use on open wounds or open sores. Avoid contact with your eyes, ears, mouth and genitals (private parts). Wash Face and genitals (private parts)  with your normal soap.  ? ?Wash thoroughly, paying special attention to the area where your surgery will be performed. ? ?Thoroughly rinse your body with warm water from the neck down. ? ?DO NOT shower/wash with your normal  soap after using and rinsing off the CHG Soap. ? ?Pat yourself dry with a CLEAN TOWEL. ? ?Wear CLEAN PAJAMAS to bed the night before surgery ? ?Place CLEAN SHEETS on your bed the night before your surgery ? ?DO NOT SLEEP WITH PETS. ? ? ?Day of Surgery: ?Take a shower with CHG soap. ?Do not wear jewelry or makeup ?Do not wear lotions, powders, perfumes, or deodorant. ?Do not shave 48 hours prior to surgery.  ?Do not bring valuables to the hospital.  ?[employer] is not responsible for any belongings or valuables. ?Do not wear nail polish, gel polish, artificial nails, or any other type of covering on natural nails (fingers and toes) ?If you have artificial nails or gel coating that need to be removed by a nail salon, please have this removed prior to surgery. Artificial nails or gel coating may interfere with anesthesia's ability to adequately monitor your vital signs. ?Wear Clean/Comfortable clothing the morning of surgery ?Do not apply any deodorants/lotions.   ?Remember to brush your teeth WITH YOUR REGULAR TOOTHPASTE. ?  ?Please read over the following fact sheets that you were given. ? ? ?3 days prior to your procedure or After your COVID test ? ?? You are not required to quarantine however you are required to wear a well-fitting mask when you are out and around people not in your household. If your mask becomes wet or soiled, replace with a new one. ? ?? Wash your hands often with soap and water for 20 seconds or clean your hands with an alcohol-based hand sanitizer that contains at least 60% alcohol. ? ?? Do not share personal items. ? ?? Notify your provider: ? ?o if you are in close contact with someone who has COVID ? ?o or if you develop a fever of 100.4 or greater, sneezing, cough, sore throat, shortness of breath or body aches.  ? ?

## 2022-02-21 ENCOUNTER — Other Ambulatory Visit: Payer: Self-pay

## 2022-02-21 ENCOUNTER — Encounter (HOSPITAL_COMMUNITY): Payer: Self-pay

## 2022-02-21 ENCOUNTER — Encounter (HOSPITAL_COMMUNITY)
Admission: RE | Admit: 2022-02-21 | Discharge: 2022-02-21 | Disposition: A | Discharge status: Home / Self Care | Payer: BC Managed Care – PPO | Source: Ambulatory Visit | Attending: General Surgery | Admitting: General Surgery

## 2022-02-21 VITALS — BP 124/81 | HR 93 | Temp 97.7°F | Resp 17 | Ht 65.0 in

## 2022-02-21 DIAGNOSIS — G40209 Localization-related (focal) (partial) symptomatic epilepsy and epileptic syndromes with complex partial seizures, not intractable, without status epilepticus: Secondary | ICD-10-CM | POA: Insufficient documentation

## 2022-02-21 DIAGNOSIS — R569 Unspecified convulsions: Secondary | ICD-10-CM

## 2022-02-21 DIAGNOSIS — Z01818 Encounter for other preprocedural examination: Secondary | ICD-10-CM | POA: Diagnosis not present

## 2022-02-21 DIAGNOSIS — Z95828 Presence of other vascular implants and grafts: Secondary | ICD-10-CM | POA: Insufficient documentation

## 2022-02-21 DIAGNOSIS — Z9641 Presence of insulin pump (external) (internal): Secondary | ICD-10-CM | POA: Diagnosis not present

## 2022-02-21 DIAGNOSIS — Z8659 Personal history of other mental and behavioral disorders: Secondary | ICD-10-CM | POA: Diagnosis not present

## 2022-02-21 DIAGNOSIS — F840 Autistic disorder: Secondary | ICD-10-CM | POA: Diagnosis not present

## 2022-02-21 DIAGNOSIS — Z20822 Contact with and (suspected) exposure to covid-19: Secondary | ICD-10-CM | POA: Diagnosis not present

## 2022-02-21 LAB — BASIC METABOLIC PANEL
Anion gap: 10 (ref 5–15)
BUN: 16 mg/dL (ref 6–20)
CO2: 25 mmol/L (ref 22–32)
Calcium: 9.5 mg/dL (ref 8.9–10.3)
Chloride: 101 mmol/L (ref 98–111)
Creatinine, Ser: 0.92 mg/dL (ref 0.44–1.00)
GFR, Estimated: >60 mL/min (ref >60)
Glucose, Bld: 92 mg/dL (ref 70–99)
Potassium: 4.2 mmol/L (ref 3.5–5.1)
Sodium: 136 mmol/L (ref 135–145)

## 2022-02-21 LAB — CBC
HCT: 41.5 % (ref 36.0–46.0)
Hemoglobin: 14 g/dL (ref 12.0–15.0)
MCH: 29.9 pg (ref 26.0–34.0)
MCHC: 33.7 g/dL (ref 30.0–36.0)
MCV: 88.5 fL (ref 80.0–100.0)
Platelets: 351 10*3/uL (ref 150–400)
RBC: 4.69 MIL/uL (ref 3.87–5.11)
RDW: 12.3 % (ref 11.5–15.5)
WBC: 8 10*3/uL (ref 4.0–10.5)
nRBC: 0 % (ref 0.0–0.2)

## 2022-02-21 LAB — SARS CORONAVIRUS 2 (TAT 6-24 HRS): SARS Coronavirus 2: NEGATIVE

## 2022-02-21 NOTE — Progress Notes (Signed)
Sent a email to Anette Riedel about special visitor circumstances as well as Rolla Flatten. Also emailed Pam of patients request for [employer] for anesthesiologist the day of surgery.  ?

## 2022-02-21 NOTE — Progress Notes (Signed)
Pt has history of seizures after surgery. Requests ativan be used and not versed. She is okay with Versed being given before going back into the operating room.  ? ?She wears a dexcom monitor that will expire the day of surgery. She is aware that it will have to come off before surgery. Patient states she has hypoglycemic episodes and is sometimes CBG 50-60 in the mornings when fasting.  ?

## 2022-02-21 NOTE — Progress Notes (Addendum)
PCP - Dorothyann Peng, NP ?Cardiologist - denies ?Neurologist: Ellouise Newer ? ?PPM/ICD - denies ? ? ?Chest x-ray - n/a ?EKG - 02/21/22 ?Stress Test - denies ?ECHO - 10/04/18 ?Cardiac Cath - denies ? ?Sleep Study - denies ? ?Patient instructed to hold all Aspirin, NSAID's, herbal medications, fish oil and vitamins 7 days prior to surgery. ? ? ?ERAS Protcol -yes, clear liquids until 430 ? ? ?COVID TEST- 02/21/22 in PAT ? ? ?Anesthesia review: yes, significant medical history including hypoglycemia (wears a dexcom). Needs her therapist, Smiley Houseman present day of surgery. Requests for port to be accessed for sedation before placing an IV all while her therapist is present. Patient also requests that Hulan Fray be her anesthesiologist day of surgery.  ? ?Patient denies shortness of breath, fever, cough and chest pain at PAT appointment ? ? ?All instructions explained to the patient, with a verbal understanding of the material. Patient agrees to go over the instructions while at home for a better understanding. Patient also instructed to self quarantine after being tested for COVID-19. The opportunity to ask questions was provided. ? ? ?

## 2022-02-24 DIAGNOSIS — F840 Autistic disorder: Secondary | ICD-10-CM | POA: Diagnosis not present

## 2022-02-24 NOTE — H&P (Signed)
PROVIDER:  Georgianne Fick, MD   MRN: [id number] DOB: December 30, 1990 DATE OF ENCOUNTER: 02/10/2022 Subjective     Chief Complaint: Follow-up (New patient new breast ref Dr. Zenia Resides )       History of Present Illness: [name] is a 31 y.o. female who is seen today for BRCA 2.    Patient is a 31 year old female with history of epilepsy and MERFF syndrome.  She was recently has been known to our practice due to for placement of a port by Dr. Zenia Resides summer of 2022.  She has a BRCA2 defect and has been having annual MRIs.  She is ready to discuss bilateral mastectomies.  She has already had oophorectomy.   She has a history of sepsis from superficial skin infection.  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  She is accompanied by her therapist.   She is interested in trying to preserve her nipples.     Review of Systems: A complete review of systems was obtained from the patient.  I have reviewed this information and discussed as appropriate with the patient.  See HPI as well for other ROS.   Review of Systems  All other systems reviewed and are negative.       Medical History: Past Medical History      Past Medical History:  Diagnosis Date   Anxiety     Asthma, unspecified asthma severity, unspecified whether complicated, unspecified whether persistent     BRCA2 gene mutation positive in female     Seizures (CMS-HCC)             Patient Active Problem List  Diagnosis   Port-A-Cath in place   Seizures (CMS-HCC)   BRCA2 positive   MERFF syndrome (CMS-HCC)   Partial idiopathic epilepsy with seizures of localized onset, intractable, without status epilepticus (CMS-HCC)   Unspecified hearing loss   Heath care associated anxiety      Past Surgical History       Past Surgical History:  Procedure Laterality Date   hysterectomy with bilateral salpingectomy   03/12/2021   LAPAROSCOPIC OOPHORECTOMY Bilateral 09/09/2021   APPENDECTOMY        CHOLECYSTECTOMY            Allergies       Allergies  Allergen Reactions   Cephalosporins Anaphylaxis, Hives, Rash, Shortness Of Breath and Swelling      REACTION: rash, throat swells shut     Midazolam Nausea And Vomiting, Other (See Comments) and Shortness Of Breath      Not effective at controlling seizure     Penicillins Anaphylaxis, Hives, Other (See Comments) and Rash      Did it involve swelling of the face/tongue/throat, SOB, or low BP? No Did it involve sudden or severe rash/hives, skin peeling, or any reaction on the inside of your mouth or nose? Yes Did you need to seek medical attention at a hospital or doctor's office? No When did it last happen?      4-5 years If all above answers are NO, may proceed with cephalosporin use. Patient experienced rash on chest and throat tightening that started within 20 min of taking the medication.  Reaction occurred 3.5 years ago.  She is not sure what medication she took (class: Penicillins), but states she has liquid amoxicillin as a child successfully.    Patient experienced rash on chest and throat tightening that started within 20 min of taking the medication.  Reaction occurred 3.5 years ago.  She is not sure what medication she took (class: Penicillins), but states she has liquid amoxicillin as a child successfully.    Tongue swells, throat does not close   Did it involve swelling of the face/tongue/throat, SOB, or low BP? No Did it involve sudden or severe rash/hives, skin peeling, or any reaction on the inside of your mouth or nose? Yes Did you need to seek medical attention at a hospital or doctor's office? No When did it last happen?      4-5 years If all above answers are "NO", may proceed with cephalosporin use.     Latex, Natural Rubber Hives, Other (See Comments) and Rash   Other Hives, Other (See Comments), Rash and Swelling      GRASS GRASS     Shellfish Containing Products Hives and Other (See Comments)   Grass  Pollen Unknown   Silver Other (See Comments) and Rash              Current Outpatient Medications on File Prior to Visit  Medication Sig Dispense Refill   adapalene (DIFFERIN) 0.3 % topical gel Apply topically       buPROPion (WELLBUTRIN XL) 150 MG XL tablet TAKE 1 TABLET BY MOUTH EVERY 12 HOURS AS DIRECTED FOR 30 DAYS       busPIRone (BUSPAR) 5 MG tablet TAKE 1 TWICE DAILY FOR ANXIETY       diazePAM (VALIUM) 5 MG tablet diazepam 5 mg tablet  TAKE 1 TABLET EVERY 8 HOURS AS NEEDED FOR PAIN/SPASMS X 5 DAYS       lacosamide (VIMPAT) 50 mg tablet Take 1 tablet (50 mg total) by mouth       levETIRAcetam (KEPPRA) 500 MG tablet levetiracetam 500 mg tablet  TAKE 1 TABLET BY MOUTH THREE TIMES A DAY       LORazepam (ATIVAN) 0.5 MG tablet Take 1 tablet as needed for seizure.       nortriptyline (PAMELOR) 10 MG capsule nortriptyline 10 mg capsule  TAKE 2 CAPSULES EVERY NIGHT       spironolactone (ALDACTONE) 50 MG tablet Take 1 tablet (50 mg total) by mouth once daily       spironolactone (ALDACTONE) 50 MG tablet spironolactone 50 mg tablet  TAKE 1 TABLET BY MOUTH EVERY DAY        No current facility-administered medications on file prior to visit.      Family History       Family History  Problem Relation Age of Onset   High blood pressure (Hypertension) Father     Hyperlipidemia (Elevated cholesterol) Father     Heart valve disease Father          Social History       Tobacco Use  Smoking Status Never  Smokeless Tobacco Never      Social History  Social History        Socioeconomic History   Marital status: Single  Tobacco Use   Smoking status: Never   Smokeless tobacco: Never  Vaping Use   Vaping Use: Never used  Substance and Sexual Activity   Alcohol use: Never   Drug use: Never        Objective:      There were no vitals filed for this visit.  There is no height or weight on file to calculate BMI.   General:  Very anxious prior to exam.  Very uncomfortable  with being out of her own clothing and being exposed.   Head:  Normocephalic and atraumatic.  Eyes:    Conjunctivae are normal. Pupils are equal, round, and reactive to light. No scleral icterus.  Neck:   Normal range of motion. Neck supple. No tracheal deviation present. No thyromegaly present.  Resp:   No respiratory distress, normal effort. Breast:  Heterogeneously dense breasts.  No palpable masses.  No nipple retraction or nipple discharge.  No skin dimpling.  No LAD.   Abd:      Abdomen is soft, non distended and non tender. No masses are palpable.  There is no rebound and no guarding.  Neurological: Alert and oriented to person, place, and time. Coordination normal.  Skin:    Skin is warm and dry. No rash noted. No diaphoretic. No erythema. No pallor.  Psychiatric: Normal mood and affect. Normal behavior. Judgment and thought content normal.    Labs, Imaging and Diagnostic Testing:   Breast MRI 11/19/2021   RECOMMENDATION: Bilateral screening breast MRI in 1 year in this patient with BRCA 2 gene mutation.   Annual bilateral screening mammogram if clinically able.   BI-RADS CATEGORY  1: Negative.     Assessment and Plan:     Diagnoses and all orders for this visit:   BRCA2 positive   MERFF syndrome (CMS-HCC)   Partial idiopathic epilepsy with seizures of localized onset, intractable, without status epilepticus (CMS-HCC)   Heath care associated anxiety     Bilateral mastectomy discussed with the patient and her therapist.  This is very reasonable in this patient with BRCA2 syndrome who has a 60 to 70% lifetime risk of developing breast cancer.  She has a significant desire to keep her nipples.  I discussed that this can make closure a bit more complicated.  She understands this.   Given her immunosuppression related to MERFF syndrome, she is reportedly not a candidate for chemotherapy if she develops triple negative breast cancer or other.  Everything she can do to  minimize her risk is preferable.   Additionally she has had sepsis related to wound infection.  Doing everything possible to minimize risk of wound breakdown is also in her best interest.  I discussed that I did not think it would be advantageous to not have drains following a mastectomy.  There is a high rate of seroma which in and of itself would reduce the risk of having infection or wound breakdown.  I would keep her on prophylactic antibiotics while drain is in place in the aggressive about getting the drain out relatively early.   I think it would also be beneficial to use monofilament suture rather than braided suture.   The patient has significant anxiety about staying in the hospital overnight.  I would try to do her first thing in the morning in order to examine her and allow her to go home as long as she is doing well.   She has a history of seizures when she emerges from anesthesia.  Because of this, I would prefer to do her in the hospital as opposed to the outpatient surgical center.  Should she have issues resolving seizure, we would have neurology readily available.   Given her significant anxiety with being undressed around others, it is imperative to wait for any undressing until she is under anesthesia.  We would need to do her block after she is asleep.  Her therapist has come to wait with her preoperatively before and has been available in the recovery room which has been a significant help to her ability  to tolerate care.   I discussed that when she comes back for appointments, it would likely be easier if she wears a button up shirt.  With the button up shirt and a breast binder, she would likely not need to remove any of her own clothing which would certainly alleviate a significant amount of anxiety.   No follow-ups on file. Georgianne Fick, MD

## 2022-02-24 NOTE — Progress Notes (Signed)
Anesthesia Chart Review: ? ?Patient has history of seizures starting at age 31.  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  She is currently followed by Dr. Delice Lesch for this.  She primarily has complex partial seizures that last for ~30 seconds with staring, drooling, mild limb jerking and self terminate.  She is currently maintained on Keppra, Vimpat, and Fycompa.  She was last seen by Dr. Delice Lesch 10/10/2021 and at that time reported being satisfied with her seizure control.  I spoke with patient by phone on 11/19/2019 prior to MRI on 11/20/21 to clarify seizure history.  She stated that overall her seizures were stable compared to when she was last seen by Dr. Delice Lesch.The pattern is variable, waxes and wanes.  She said recently she has had an increase in complex partial seizures due to insomnia secondary to recent right oophorectomy which induced menopause.  She says he did not have any complications with that surgery on 09/09/2021.  She also had total laparoscopic hysterectomy 5/63/8937 without complication.  She reports that she typically has 2-4 complex partial seizures in PACU after any surgery lasting ~30 seconds each.  She states that overall her seizure frequency feels stable and she has not recently had any generalized tonic-clonic seizures. ?  ?Patient has a port at the right chest that was placed for history of difficult venous access.  She states that for her last several surgeries she has been given medication through her port to help her relax prior to peripheral IV access being obtained and that this has worked well she.  He also has a history of severe anxiety and PTSD and is accompanied to all appointments by her therapist, Margreta Journey. ? ?She wears a Dexcom for history of hypoglycemia.  ?  ?Preop labs reviewed, WNL. ?  ?EKG 02/21/22: Normal sinus rhythm with sinus arrhythmia. Rate 72. Possible Left atrial  enlargement ?  ?TTE 10/04/2018: ?- Left ventricle: The cavity size was normal. Systolic function was  ?  normal. The estimated ejection fraction was in the range of 60%  ?  to 65%. Wall motion was normal; there were no regional wall  ?  motion abnormalities. Left ventricular diastolic function  ?  parameters were normal.  ?- Aortic valve: Transvalvular velocity was within the normal range.  ?  There was no stenosis. There was no regurgitation.  ?- Mitral valve: Transvalvular velocity was within the normal range.  ?  There was no evidence for stenosis. There was no regurgitation.  ?- Right ventricle: The cavity size was normal. Wall thickness was  ?  normal. Systolic function was normal.  ?- Pulmonary arteries: Systolic pressure was within the normal  ?  range. PA peak pressure: 20 mm Hg (S).  ? ? ? ?Karoline Caldwell, PA-C ?[facility]/Anesthesiology ?Phone [phone] ?02/24/2022 9:20 AM ? ?  ?

## 2022-02-24 NOTE — Anesthesia Preprocedure Evaluation (Addendum)
Anesthesia Evaluation    History of Anesthesia Complications (+) PONV and history of anesthetic complications  Airway        Dental   Pulmonary asthma ,           Cardiovascular   TTE 09/2018 EF 60-65%, no valvular abnormalities   Neuro/Psych  Headaches, Seizures - (seizes in PACU after most anesthetics despite compliance with anti-epileptics), Poorly Controlled,  PSYCHIATRIC DISORDERS Anxiety myoclonus epilepsy and ragged red fibers    GI/Hepatic GERD  ,  Endo/Other    Renal/GU      Musculoskeletal   Abdominal   Peds  Hematology   Anesthesia Other Findings   Reproductive/Obstetrics                             Anesthesia Physical Anesthesia Plan  ASA: 3  Anesthesia Plan: General and Regional   Post-op Pain Management: Regional block* and Ofirmev IV (intra-op)*   Induction: Intravenous  PONV Risk Score and Plan: 4 or greater and Midazolam, Dexamethasone and Ondansetron  Airway Management Planned: Oral ETT  Additional Equipment:   Intra-op Plan:   Post-operative Plan: Extubation in OR  Informed Consent: I have reviewed the patients History and Physical, chart, labs and discussed the procedure including the risks, benefits and alternatives for the proposed anesthesia with the patient or authorized representative who has indicated his/her understanding and acceptance.     Dental advisory given  Plan Discussed with: CRNA  Anesthesia Plan Comments: (PEC block after induction 2/2 pt's extreme anxiety. )                                        Anesthesia Evaluation  Patient identified by MRN, date of birth, ID band Patient awake    Reviewed: Allergy & Precautions, NPO status , Patient's Chart, lab work & pertinent test results  History of Anesthesia Complications (+) PONV  Airway Mallampati: III  TM Distance: >3 FB Neck ROM: Full    Dental  (+) Chipped, Dental  Advisory Given,    Pulmonary asthma ,    Pulmonary exam normal breath sounds clear to auscultation       Cardiovascular negative cardio ROS Normal cardiovascular exam Rhythm:Regular Rate:Normal  TTE 09/2018 EF 60-65%, no valvular abnormalities   Neuro/Psych  Headaches, Seizures - (seizures after anesthesia 2018, 2019, 2022, last seizure 2 days ago), Poorly Controlled,  PSYCHIATRIC DISORDERS Anxiety myoclonus epilepsy and ragged red fibers  Neuromuscular disease    GI/Hepatic Neg liver ROS, GERD  Controlled and Medicated,  Endo/Other  negative endocrine ROS  Renal/GU negative Renal ROS  negative genitourinary   Musculoskeletal negative musculoskeletal ROS (+)   Abdominal   Peds  Hematology negative hematology ROS (+)   Anesthesia Other Findings   Reproductive/Obstetrics                           Anesthesia Physical Anesthesia Plan  ASA: 3  Anesthesia Plan: General   Post-op Pain Management:    Induction: Intravenous  PONV Risk Score and Plan: 4 or greater and Ondansetron, Dexamethasone, Midazolam and TIVA  Airway Management Planned: LMA  Additional Equipment:   Intra-op Plan:   Post-operative Plan: Extubation in OR  Informed Consent: I have reviewed the patients History and Physical, chart, labs and discussed the procedure including the risks, benefits and  alternatives for the proposed anesthesia with the patient or authorized representative who has indicated his/her understanding and acceptance.     Dental advisory given  Plan Discussed with: CRNA  Anesthesia Plan Comments: ( )       Anesthesia Quick Evaluation                                   Anesthesia Evaluation  Patient identified by MRN, date of birth, ID band Patient awake    Reviewed: Allergy & Precautions, NPO status , Patient's Chart, lab work & pertinent test results  History of Anesthesia Complications (+) PONV  Airway Mallampati: III  TM  Distance: >3 FB Neck ROM: Full    Dental  (+) Chipped, Dental Advisory Given,    Pulmonary asthma ,    Pulmonary exam normal breath sounds clear to auscultation       Cardiovascular negative cardio ROS Normal cardiovascular exam Rhythm:Regular Rate:Normal  TTE 09/2018 EF 60-65%, no valvular abnormalities   Neuro/Psych  Headaches, Seizures - (seizures after anesthesia 2018, 2019, 2022, last seizure 2 days ago), Poorly Controlled,  PSYCHIATRIC DISORDERS Anxiety myoclonus epilepsy and ragged red fibers  Neuromuscular disease    GI/Hepatic Neg liver ROS, GERD  Controlled and Medicated,  Endo/Other  negative endocrine ROS  Renal/GU negative Renal ROS  negative genitourinary   Musculoskeletal negative musculoskeletal ROS (+)   Abdominal   Peds  Hematology negative hematology ROS (+)   Anesthesia Other Findings   Reproductive/Obstetrics                           Anesthesia Physical Anesthesia Plan  ASA: 3  Anesthesia Plan: General   Post-op Pain Management:    Induction: Intravenous  PONV Risk Score and Plan: 4 or greater and Ondansetron, Dexamethasone, Midazolam and TIVA  Airway Management Planned: LMA  Additional Equipment:   Intra-op Plan:   Post-operative Plan: Extubation in OR  Informed Consent: I have reviewed the patients History and Physical, chart, labs and discussed the procedure including the risks, benefits and alternatives for the proposed anesthesia with the patient or authorized representative who has indicated his/her understanding and acceptance.     Dental advisory given  Plan Discussed with: CRNA  Anesthesia Plan Comments: ( )       Anesthesia Quick Evaluation                                   Anesthesia Evaluation  Patient identified by MRN, date of birth, ID band Patient awake    Reviewed: Allergy & Precautions, NPO status , Patient's Chart, lab work & pertinent test results  History of  Anesthesia Complications (+) PONV  Airway Mallampati: III  TM Distance: >3 FB Neck ROM: Full    Dental  (+) Chipped, Dental Advisory Given,    Pulmonary asthma ,    Pulmonary exam normal breath sounds clear to auscultation       Cardiovascular negative cardio ROS Normal cardiovascular exam Rhythm:Regular Rate:Normal  TTE 09/2018 EF 60-65%, no valvular abnormalities   Neuro/Psych  Headaches, Seizures - (seizures after anesthesia 2018, 2019, 2022, last seizure 2 days ago), Poorly Controlled,  PSYCHIATRIC DISORDERS Anxiety myoclonus epilepsy and ragged red fibers  Neuromuscular disease    GI/Hepatic Neg liver ROS, GERD  Controlled and Medicated,  Endo/Other  negative  endocrine ROS  Renal/GU negative Renal ROS  negative genitourinary   Musculoskeletal negative musculoskeletal ROS (+)   Abdominal   Peds  Hematology negative hematology ROS (+)   Anesthesia Other Findings   Reproductive/Obstetrics                           Anesthesia Physical Anesthesia Plan  ASA: 3  Anesthesia Plan: General   Post-op Pain Management:    Induction: Intravenous  PONV Risk Score and Plan: 4 or greater and Ondansetron, Dexamethasone, Midazolam and TIVA  Airway Management Planned: LMA  Additional Equipment:   Intra-op Plan:   Post-operative Plan: Extubation in OR  Informed Consent: I have reviewed the patients History and Physical, chart, labs and discussed the procedure including the risks, benefits and alternatives for the proposed anesthesia with the patient or authorized representative who has indicated his/her understanding and acceptance.     Dental advisory given  Plan Discussed with: CRNA  Anesthesia Plan Comments: ( )       Anesthesia Quick Evaluation  Anesthesia Quick Evaluation

## 2022-02-25 ENCOUNTER — Ambulatory Visit (HOSPITAL_COMMUNITY): Payer: BC Managed Care – PPO | Admitting: Anesthesiology

## 2022-02-25 ENCOUNTER — Encounter (HOSPITAL_COMMUNITY)
Admission: AD | Disposition: A | Discharge status: Home / Self Care | Payer: Self-pay | Source: Home / Self Care | Attending: General Surgery

## 2022-02-25 ENCOUNTER — Inpatient Hospital Stay (HOSPITAL_COMMUNITY)
Admission: AD | Admit: 2022-02-25 | Discharge: 2022-02-25 | DRG: 584 | Disposition: A | Discharge status: Home / Self Care | Payer: BC Managed Care – PPO | Attending: General Surgery | Admitting: General Surgery

## 2022-02-25 ENCOUNTER — Other Ambulatory Visit: Payer: Self-pay

## 2022-02-25 ENCOUNTER — Encounter (HOSPITAL_COMMUNITY): Payer: Self-pay | Admitting: General Surgery

## 2022-02-25 DIAGNOSIS — Z91018 Allergy to other foods: Secondary | ICD-10-CM | POA: Diagnosis not present

## 2022-02-25 DIAGNOSIS — Z888 Allergy status to other drugs, medicaments and biological substances status: Secondary | ICD-10-CM | POA: Diagnosis not present

## 2022-02-25 DIAGNOSIS — C50919 Malignant neoplasm of unspecified site of unspecified female breast: Principal | ICD-10-CM | POA: Diagnosis present

## 2022-02-25 DIAGNOSIS — Z91048 Other nonmedicinal substance allergy status: Secondary | ICD-10-CM

## 2022-02-25 DIAGNOSIS — G4700 Insomnia, unspecified: Secondary | ICD-10-CM

## 2022-02-25 DIAGNOSIS — J45909 Unspecified asthma, uncomplicated: Secondary | ICD-10-CM | POA: Diagnosis present

## 2022-02-25 DIAGNOSIS — Z91013 Allergy to seafood: Secondary | ICD-10-CM | POA: Diagnosis not present

## 2022-02-25 DIAGNOSIS — Z91040 Latex allergy status: Secondary | ICD-10-CM | POA: Diagnosis not present

## 2022-02-25 DIAGNOSIS — F419 Anxiety disorder, unspecified: Secondary | ICD-10-CM | POA: Diagnosis not present

## 2022-02-25 DIAGNOSIS — Z1501 Genetic susceptibility to malignant neoplasm of breast: Secondary | ICD-10-CM | POA: Diagnosis not present

## 2022-02-25 DIAGNOSIS — F840 Autistic disorder: Secondary | ICD-10-CM | POA: Diagnosis not present

## 2022-02-25 DIAGNOSIS — R232 Flushing: Secondary | ICD-10-CM

## 2022-02-25 DIAGNOSIS — Z79899 Other long term (current) drug therapy: Secondary | ICD-10-CM | POA: Diagnosis not present

## 2022-02-25 DIAGNOSIS — Z880 Allergy status to penicillin: Secondary | ICD-10-CM

## 2022-02-25 DIAGNOSIS — H9190 Unspecified hearing loss, unspecified ear: Secondary | ICD-10-CM | POA: Diagnosis present

## 2022-02-25 DIAGNOSIS — E8842 MERRF syndrome: Secondary | ICD-10-CM | POA: Diagnosis not present

## 2022-02-25 DIAGNOSIS — F4312 Post-traumatic stress disorder, chronic: Secondary | ICD-10-CM | POA: Diagnosis not present

## 2022-02-25 DIAGNOSIS — N6012 Diffuse cystic mastopathy of left breast: Secondary | ICD-10-CM | POA: Diagnosis not present

## 2022-02-25 DIAGNOSIS — Z4001 Encounter for prophylactic removal of breast: Principal | ICD-10-CM | POA: Diagnosis present

## 2022-02-25 DIAGNOSIS — G8918 Other acute postprocedural pain: Secondary | ICD-10-CM | POA: Diagnosis not present

## 2022-02-25 DIAGNOSIS — N6011 Diffuse cystic mastopathy of right breast: Secondary | ICD-10-CM | POA: Diagnosis not present

## 2022-02-25 DIAGNOSIS — Z8249 Family history of ischemic heart disease and other diseases of the circulatory system: Secondary | ICD-10-CM | POA: Diagnosis not present

## 2022-02-25 DIAGNOSIS — Z881 Allergy status to other antibiotic agents status: Secondary | ICD-10-CM | POA: Diagnosis not present

## 2022-02-25 DIAGNOSIS — D849 Immunodeficiency, unspecified: Secondary | ICD-10-CM | POA: Diagnosis not present

## 2022-02-25 HISTORY — PX: NIPPLE SPARING MASTECTOMY: SHX6537

## 2022-02-25 LAB — GLUCOSE, CAPILLARY: Glucose-Capillary: 91 mg/dL (ref 70–99)

## 2022-02-25 SURGERY — MASTECTOMY, NIPPLE SPARING
Anesthesia: Regional | Site: Breast | Laterality: Bilateral

## 2022-02-25 MED ORDER — GABAPENTIN 300 MG PO CAPS: 300.0000 mg | ORAL_CAPSULE | Freq: Two times a day (BID) | ORAL | 0 refills | Status: DC

## 2022-02-25 MED ORDER — FENTANYL CITRATE (PF) 100 MCG/2ML IJ SOLN
25.0000 ug | INTRAMUSCULAR | Status: DC | PRN
  Administered 2022-02-25: 50 ug via INTRAVENOUS

## 2022-02-25 MED ORDER — MIDAZOLAM HCL 2 MG/2ML IJ SOLN: 2.0000 mg | Freq: Once | INTRAMUSCULAR | Status: AC

## 2022-02-25 MED ORDER — ORAL CARE MOUTH RINSE: 15.0000 mL | Freq: Once | OROMUCOSAL | Status: AC

## 2022-02-25 MED ORDER — MIDAZOLAM HCL 2 MG/2ML IJ SOLN
INTRAMUSCULAR | Status: AC
  Administered 2022-02-25: 2 mg
  Filled 2022-02-25: qty 2

## 2022-02-25 MED ORDER — HEMOSTATIC AGENTS (NO CHARGE) OPTIME
TOPICAL | Status: DC | PRN
  Administered 2022-02-25: 1 via TOPICAL

## 2022-02-25 MED ORDER — HYDROMORPHONE HCL 1 MG/ML IJ SOLN
INTRAMUSCULAR | Status: AC
  Filled 2022-02-25: qty 1

## 2022-02-25 MED ORDER — DEXAMETHASONE SODIUM PHOSPHATE 10 MG/ML IJ SOLN
INTRAMUSCULAR | Status: DC | PRN
  Administered 2022-02-25 (×2): 5 mg

## 2022-02-25 MED ORDER — MIDAZOLAM HCL 2 MG/2ML IJ SOLN
INTRAMUSCULAR | Status: AC
  Filled 2022-02-25: qty 2

## 2022-02-25 MED ORDER — LORAZEPAM 2 MG/ML IJ SOLN: 2.0000 mg | Freq: Once | INTRAMUSCULAR | Status: AC

## 2022-02-25 MED ORDER — GLYCOPYRROLATE PF 0.2 MG/ML IJ SOSY
PREFILLED_SYRINGE | INTRAMUSCULAR | Status: AC
  Filled 2022-02-25: qty 1

## 2022-02-25 MED ORDER — CHLORHEXIDINE GLUCONATE CLOTH 2 % EX PADS: 6.0000 | MEDICATED_PAD | Freq: Once | CUTANEOUS | Status: DC

## 2022-02-25 MED ORDER — DEXMEDETOMIDINE (PRECEDEX) IN NS 20 MCG/5ML (4 MCG/ML) IV SYRINGE
PREFILLED_SYRINGE | INTRAVENOUS | Status: DC | PRN
  Administered 2022-02-25: 28 ug via INTRAVENOUS
  Administered 2022-02-25: 4 ug via INTRAVENOUS

## 2022-02-25 MED ORDER — DIAZEPAM 5 MG PO TABS: 5.0000 mg | ORAL_TABLET | Freq: Three times a day (TID) | ORAL | 1 refills | Status: DC | PRN

## 2022-02-25 MED ORDER — HYDROMORPHONE HCL 1 MG/ML IJ SOLN
0.2500 mg | INTRAMUSCULAR | Status: DC | PRN
  Administered 2022-02-25 (×4): 0.5 mg via INTRAVENOUS

## 2022-02-25 MED ORDER — LACTATED RINGERS IV SOLN: INTRAVENOUS | Status: DC

## 2022-02-25 MED ORDER — ONDANSETRON HCL 4 MG/2ML IJ SOLN
INTRAMUSCULAR | Status: DC | PRN
  Administered 2022-02-25: 4 mg via INTRAVENOUS

## 2022-02-25 MED ORDER — OXYCODONE HCL 5 MG PO TABS
5.0000 mg | ORAL_TABLET | Freq: Once | ORAL | Status: AC
  Administered 2022-02-25: 5 mg via ORAL

## 2022-02-25 MED ORDER — MIDAZOLAM HCL 2 MG/2ML IJ SOLN
INTRAMUSCULAR | Status: DC | PRN
Start: 2022-02-25 — End: 2022-02-25
  Administered 2022-02-25 (×3): 2 mg via INTRAVENOUS

## 2022-02-25 MED ORDER — LORAZEPAM 2 MG/ML IJ SOLN
INTRAMUSCULAR | Status: AC
  Filled 2022-02-25: qty 1

## 2022-02-25 MED ORDER — DEXMEDETOMIDINE HCL IN NACL 400 MCG/100ML IV SOLN
INTRAVENOUS | Status: AC
  Filled 2022-02-25: qty 100

## 2022-02-25 MED ORDER — SODIUM CHLORIDE 0.9 % IV SOLN
INTRAVENOUS | Status: DC | PRN
  Administered 2022-02-25: 80 mg

## 2022-02-25 MED ORDER — DEXAMETHASONE SODIUM PHOSPHATE 10 MG/ML IJ SOLN
INTRAMUSCULAR | Status: DC | PRN
  Administered 2022-02-25: 8 mg via INTRAVENOUS

## 2022-02-25 MED ORDER — ONDANSETRON HCL 4 MG/2ML IJ SOLN
INTRAMUSCULAR | Status: AC
  Filled 2022-02-25: qty 2

## 2022-02-25 MED ORDER — DIPHENHYDRAMINE HCL 50 MG/ML IJ SOLN
INTRAMUSCULAR | Status: AC
  Filled 2022-02-25: qty 1

## 2022-02-25 MED ORDER — OXYCODONE HCL 5 MG PO TABS: 5.0000 mg | ORAL_TABLET | Freq: Four times a day (QID) | ORAL | 0 refills | Status: DC | PRN

## 2022-02-25 MED ORDER — FENTANYL CITRATE (PF) 250 MCG/5ML IJ SOLN
INTRAMUSCULAR | Status: AC
  Filled 2022-02-25: qty 5

## 2022-02-25 MED ORDER — DIPHENHYDRAMINE HCL 50 MG/ML IJ SOLN
INTRAMUSCULAR | Status: DC | PRN
Start: 2022-02-25 — End: 2022-02-25
  Administered 2022-02-25: 12.5 mg via INTRAVENOUS

## 2022-02-25 MED ORDER — DEXAMETHASONE SODIUM PHOSPHATE 10 MG/ML IJ SOLN
INTRAMUSCULAR | Status: AC
  Filled 2022-02-25: qty 1

## 2022-02-25 MED ORDER — SUGAMMADEX SODIUM 200 MG/2ML IV SOLN
INTRAVENOUS | Status: DC | PRN
Start: 2022-02-25 — End: 2022-02-25
  Administered 2022-02-25 (×2): 50 mg via INTRAVENOUS

## 2022-02-25 MED ORDER — ROPIVACAINE HCL 5 MG/ML IJ SOLN
INTRAMUSCULAR | Status: DC | PRN
  Administered 2022-02-25 (×2): 13 mL via PERINEURAL

## 2022-02-25 MED ORDER — LORAZEPAM 2 MG/ML IJ SOLN
1.0000 mg | Freq: Once | INTRAMUSCULAR | Status: AC
  Administered 2022-02-25: 1 mg via INTRAVENOUS

## 2022-02-25 MED ORDER — VANCOMYCIN HCL 1 G IV SOLR: Freq: Once | INTRAVENOUS | Status: DC

## 2022-02-25 MED ORDER — 0.9 % SODIUM CHLORIDE (POUR BTL) OPTIME
TOPICAL | Status: DC | PRN
  Administered 2022-02-25: 1000 mL

## 2022-02-25 MED ORDER — FENTANYL CITRATE (PF) 250 MCG/5ML IJ SOLN
INTRAMUSCULAR | Status: DC | PRN
Start: 2022-02-25 — End: 2022-02-25
  Administered 2022-02-25 (×4): 50 ug via INTRAVENOUS

## 2022-02-25 MED ORDER — LORAZEPAM 2 MG/ML IJ SOLN
INTRAMUSCULAR | Status: AC
  Administered 2022-02-25: 2 mg
  Filled 2022-02-25: qty 1

## 2022-02-25 MED ORDER — OXYCODONE HCL 5 MG PO TABS
ORAL_TABLET | ORAL | Status: AC
  Filled 2022-02-25: qty 1

## 2022-02-25 MED ORDER — GENTAMICIN SULFATE 40 MG/ML IJ SOLN
80.0000 mg | INTRAMUSCULAR | Status: DC
  Filled 2022-02-25: qty 2

## 2022-02-25 MED ORDER — ROCURONIUM BROMIDE 10 MG/ML (PF) SYRINGE
PREFILLED_SYRINGE | INTRAVENOUS | Status: AC
  Filled 2022-02-25: qty 10

## 2022-02-25 MED ORDER — DEXMEDETOMIDINE (PRECEDEX) IN NS 20 MCG/5ML (4 MCG/ML) IV SYRINGE
PREFILLED_SYRINGE | INTRAVENOUS | Status: AC
  Filled 2022-02-25: qty 10

## 2022-02-25 MED ORDER — ACETAMINOPHEN 500 MG PO TABS
1000.0000 mg | ORAL_TABLET | ORAL | Status: AC
  Administered 2022-02-25: 1000 mg via ORAL
  Filled 2022-02-25: qty 2

## 2022-02-25 MED ORDER — DOXYCYCLINE HYCLATE 100 MG PO CAPS: 100.0000 mg | ORAL_CAPSULE | Freq: Two times a day (BID) | ORAL | 0 refills | Status: DC

## 2022-02-25 MED ORDER — CIPROFLOXACIN IN D5W 400 MG/200ML IV SOLN
400.0000 mg | INTRAVENOUS | Status: AC
  Administered 2022-02-25: 400 mg via INTRAVENOUS
  Filled 2022-02-25: qty 200

## 2022-02-25 MED ORDER — FENTANYL CITRATE (PF) 100 MCG/2ML IJ SOLN
INTRAMUSCULAR | Status: AC
  Filled 2022-02-25: qty 2

## 2022-02-25 MED ORDER — CHLORHEXIDINE GLUCONATE 0.12 % MT SOLN
15.0000 mL | Freq: Once | OROMUCOSAL | Status: AC
  Administered 2022-02-25: 15 mL via OROMUCOSAL
  Filled 2022-02-25: qty 15

## 2022-02-25 MED ORDER — PROPOFOL 10 MG/ML IV BOLUS
INTRAVENOUS | Status: DC | PRN
  Administered 2022-02-25: 120 mg via INTRAVENOUS

## 2022-02-25 MED ORDER — PROPOFOL 10 MG/ML IV BOLUS
INTRAVENOUS | Status: AC
  Filled 2022-02-25: qty 20

## 2022-02-25 MED ORDER — HYDROMORPHONE HCL 1 MG/ML IJ SOLN
0.2500 mg | INTRAMUSCULAR | Status: DC | PRN
  Administered 2022-02-25 (×3): 0.5 mg via INTRAVENOUS

## 2022-02-25 MED ORDER — ROCURONIUM BROMIDE 10 MG/ML (PF) SYRINGE
PREFILLED_SYRINGE | INTRAVENOUS | Status: DC | PRN
  Administered 2022-02-25: 50 mg via INTRAVENOUS
  Administered 2022-02-25 (×3): 10 mg via INTRAVENOUS

## 2022-02-25 MED ORDER — LACTATED RINGERS IV SOLN: INTRAVENOUS | Status: DC | PRN

## 2022-02-25 SURGICAL SUPPLY — 49 items
ADH SKN CLS APL DERMABOND .7 (GAUZE/BANDAGES/DRESSINGS) ×2
AGENT HMST 10 BLLW SHRT CANN (HEMOSTASIS) ×2
APL PRP STRL LF DISP 70% ISPRP (MISCELLANEOUS) ×1
BAG COUNTER SPONGE SURGICOUNT (BAG) ×2 IMPLANT
BAG SPNG CNTER NS LX DISP (BAG) ×1
BINDER BREAST LRG (GAUZE/BANDAGES/DRESSINGS) ×1 IMPLANT
BINDER BREAST XLRG (GAUZE/BANDAGES/DRESSINGS) IMPLANT
BIOPATCH RED 1 DISK 7.0 (GAUZE/BANDAGES/DRESSINGS) ×2 IMPLANT
CANISTER SUCT 3000ML PPV (MISCELLANEOUS) ×3 IMPLANT
CHLORAPREP W/TINT 26 (MISCELLANEOUS) ×2 IMPLANT
COVER SURGICAL LIGHT HANDLE (MISCELLANEOUS) ×2 IMPLANT
DERMABOND ADVANCED (GAUZE/BANDAGES/DRESSINGS) ×2
DERMABOND ADVANCED .7 DNX12 (GAUZE/BANDAGES/DRESSINGS) ×1 IMPLANT
DRAIN CHANNEL 19F RND (DRAIN) ×3 IMPLANT
DRSG PAD ABDOMINAL 8X10 ST (GAUZE/BANDAGES/DRESSINGS) ×1 IMPLANT
DRSG TEGADERM 2-3/8X2-3/4 SM (GAUZE/BANDAGES/DRESSINGS) IMPLANT
ELECT BLADE 4.0 EZ CLEAN MEGAD (MISCELLANEOUS) ×2
ELECT REM PT RETURN 9FT ADLT (ELECTROSURGICAL) ×2
ELECTRODE BLDE 4.0 EZ CLN MEGD (MISCELLANEOUS) IMPLANT
ELECTRODE REM PT RTRN 9FT ADLT (ELECTROSURGICAL) ×1 IMPLANT
EVACUATOR SILICONE 100CC (DRAIN) ×3 IMPLANT
GAUZE SPONGE 4X4 12PLY STRL (GAUZE/BANDAGES/DRESSINGS) ×2 IMPLANT
GLOVE SURG ENC MOIS LTX SZ6 (GLOVE) ×1 IMPLANT
GLOVE SURG POLYISO LF SZ6 (GLOVE) ×1 IMPLANT
GLOVE SURG UNDER LTX SZ6.5 (GLOVE) ×1 IMPLANT
GOWN STRL REUS W/ TWL LRG LVL3 (GOWN DISPOSABLE) ×1 IMPLANT
GOWN STRL REUS W/TWL 2XL LVL3 (GOWN DISPOSABLE) ×2 IMPLANT
GOWN STRL REUS W/TWL LRG LVL3 (GOWN DISPOSABLE) ×2
HEMOSTAT HEMOBLAST BELLOWS (HEMOSTASIS) ×2 IMPLANT
ILLUMINATOR WAVEGUIDE N/F (MISCELLANEOUS) IMPLANT
KIT BASIN OR (CUSTOM PROCEDURE TRAY) ×2 IMPLANT
KIT TURNOVER KIT B (KITS) ×2 IMPLANT
LIGHT WAVEGUIDE WIDE FLAT (MISCELLANEOUS) ×1 IMPLANT
NDL SPNL 18GX3.5 QUINCKE PK (NEEDLE) IMPLANT
NEEDLE SPNL 18GX3.5 QUINCKE PK (NEEDLE) IMPLANT
NS IRRIG 1000ML POUR BTL (IV SOLUTION) ×2 IMPLANT
PACK GENERAL/GYN (CUSTOM PROCEDURE TRAY) ×2 IMPLANT
PACK UNIVERSAL I (CUSTOM PROCEDURE TRAY) ×1 IMPLANT
PAD ABD 8X10 STRL (GAUZE/BANDAGES/DRESSINGS) ×2 IMPLANT
PAD ARMBOARD 7.5X6 YLW CONV (MISCELLANEOUS) ×2 IMPLANT
PENCIL SMOKE EVACUATOR (MISCELLANEOUS) ×1 IMPLANT
STRIP CLOSURE SKIN 1/2X4 (GAUZE/BANDAGES/DRESSINGS) ×2 IMPLANT
SUT ETHILON 2 0 FS 18 (SUTURE) ×3 IMPLANT
SUT MON AB 4-0 PC3 18 (SUTURE) ×1 IMPLANT
SUT SILK 2 0 SH (SUTURE) ×2 IMPLANT
SUT VIC AB 3-0 SH 8-18 (SUTURE) ×3 IMPLANT
SYR 50ML LL SCALE MARK (SYRINGE) IMPLANT
TOWEL GREEN STERILE (TOWEL DISPOSABLE) ×2 IMPLANT
TOWEL GREEN STERILE FF (TOWEL DISPOSABLE) ×2 IMPLANT

## 2022-02-25 NOTE — Anesthesia Procedure Notes (Signed)
Procedure Name: Intubation ?Date/Time: 02/25/2022 8:22 AM ?Performed by: Gayland Curry, CRNA ?Pre-anesthesia Checklist: Patient identified, Emergency Drugs available, Suction available and Patient being monitored ?Patient Re-evaluated:Patient Re-evaluated prior to induction ?Oxygen Delivery Method: Circle system utilized ?Preoxygenation: Pre-oxygenation with 100% oxygen ?Induction Type: IV induction ?Ventilation: Mask ventilation without difficulty ?Laryngoscope Size: Mac and 3 ?Grade View: Grade I ?Tube type: Oral ?Tube size: 6.5 mm ?Number of attempts: 1 ?Placement Confirmation: ETT inserted through vocal cords under direct vision, positive ETCO2 and breath sounds checked- equal and bilateral ?Secured at: 20 cm ?Tube secured with: Tape ?Dental Injury: Teeth and Oropharynx as per pre-operative assessment  ? ? ? ? ?

## 2022-02-25 NOTE — Anesthesia Procedure Notes (Signed)
Anesthesia Regional Block: Pectoralis block  ? ?Pre-Anesthetic Checklist: , timeout performed,  Correct Patient, Correct Site, Correct Laterality,  Correct Procedure, Correct Position, site marked,  Risks and benefits discussed,  Surgical consent,  Pre-op evaluation,  At surgeon's request and post-op pain management ? ?Laterality: Left ? ?Prep: Maximum Sterile Barrier Precautions used, chloraprep     ?  ?Needles:  ?Injection technique: Single-shot ? ?Needle Type: Echogenic Stimulator Needle   ? ? ?Needle Length: 9cm  ?Needle Gauge: 22  ? ? ? ?Additional Needles: ? ? ?Procedures:,,,, ultrasound used (permanent image in chart),,    ?Narrative:  ?Start time: 02/25/2022 8:30 AM ?End time: 02/25/2022 8:33 AM ?Injection made incrementally with aspirations every 5 mL. ? ?Performed by: Personally  ?Anesthesiologist: Freddrick March, MD ? ?Additional Notes: ?Monitors applied. No increased pain on injection. No increased resistance to injection. Injection made in 5cc increments. Good needle visualization. Patient tolerated procedure well.  ? ? ? ? ?

## 2022-02-25 NOTE — Interval H&P Note (Signed)
History and Physical Interval Note: ? ?02/25/2022 ?7:28 AM ? ?[name]  has presented today for surgery, with the diagnosis of BRCA 2.  The various methods of treatment have been discussed with the patient and family. After consideration of risks, benefits and other options for treatment, the patient has consented to  Procedure(s): ?BILATERAL TOTAL MASTECTOMY (Bilateral) as a surgical intervention.  The patient's history has been reviewed, patient examined, no change in status, stable for surgery.  I have reviewed the patient's chart and labs.  Questions were answered to the patient's satisfaction.   ? ? ?Stark Klein ? ? ?

## 2022-02-25 NOTE — Transfer of Care (Signed)
Immediate Anesthesia Transfer of Care Note ? ?Patient: [name] ? ?Procedure(s) Performed: BILATERAL NIPPLE-SPARING MASTECTOMY (Bilateral: Breast) ? ?Patient Location: PACU ? ?Anesthesia Type:General ? ?Level of Consciousness: sedated, drowsy and patient cooperative ? ?Airway & Oxygen Therapy: Patient Spontanous Breathing ? ?Post-op Assessment: Report given to RN and Post -op Vital signs reviewed and stable ? ?Post vital signs: Reviewed and stable ? ?Last Vitals:  ?Vitals Value Taken Time  ?BP 119/76 02/25/22 1114  ?Temp    ?Pulse 62 02/25/22 1118  ?Resp 11 02/25/22 1118  ?SpO2 100 % 02/25/22 1118  ?Vitals shown include unvalidated device data. ? ?Last Pain:  ?Vitals:  ? 02/25/22 0624  ?TempSrc:   ?PainSc: 0-No pain  ?   ? ?  ? ?Complications: No notable events documented. ?

## 2022-02-25 NOTE — Progress Notes (Addendum)
Patient to phase II at 1645. Woodrum, MD at the bedside to evaluate patient and stated goal of getting patient up to recliner prior to discharge. Patient stood with help of nursing and began to experience an aura at 1700. Nursing assisted patient to supine position on the bed with suction ready, oxygen applied, pulse oximetry, and blood pressure cuff. The patient's surroundings were cleared and safe in bed. Patient had three seizures lasting about 30 seconds each starting at 1706. Staring and mild limb jerking were noted. Seizure self-terminated. No new orders from Lanetta Inch, MD at the bedside for each seizure. VSS and patient returned to baseline mentation. Patient suffered no harm as a result of seizure. Patient on monitor. ? ?At 72 MD and nursing assisted the patient up to the recliner. Patient tolerated ambulation well and had a steady gait with assistance. ? ?Lanetta Inch, MD stated that the patient is clear to discharge home. MD stated that it is in the patient's best interest to discharge home due to the patient's history. The patient and her family were educated on post-surgical care and stated they were comfortable with discharging home as well. ? ?

## 2022-02-25 NOTE — Op Note (Signed)
Bilateral Mastectomies  ? ?Indications: This patient presents with history of BRCA 2 defect ? ?Pre-operative Diagnosis: BRCA 2 ? ?Post-operative Diagnosis: same ? ?Surgeon: Stark Klein  ? ?Anesthesia: General endotracheal anesthesia and pectoral block ? ?ASA Class: 3 ? ?Procedure Details  ?The patient was seen in the Holding Room. The risks, benefits, complications, treatment options, and expected outcomes were discussed with the patient. The possibilities of reaction to medication, pulmonary aspiration, bleeding, infection, the need for additional procedures, failure to diagnose a condition, and creating a complication requiring transfusion or operation were discussed with the patient. The patient concurred with the proposed plan, giving informed consent.  The site of surgery properly noted/marked. The patient was taken to Operating Room # 2, identified as Lydiah "Kit" Levey and the procedure verified as bilateral Mastectomy. After induction of anesthesia, the breast and chest were prepped and draped in standard fashion.  A Time Out was held and the above information confirmed.   ? ?The right side was addressed first.  The borders of the breast were identified and marked.  An inframammary incision was made. The dissection was taken to the fascia of the pectoralis major.  The breast tissue was taken off the pectoralis fascia first.  It was then taken off the skin, taking care not to buttonhole the skin.  The superior flap was taken medially to the lateral sternal border, superiorly to the inferior border of the clavicle.  The skin flap was taken laterally to the border of the latissimus.  The breast was taken off including the pectoralis fascia and the axillary tail marked.   ? ?The wound was irrigated with saline, and then with gentamicin irrigation.  One 19 Blake drain was placed laterally.  Hemostasis was achieved with cautery.  The wound was then closed with a 3-0 Vicryl deep dermal interrupted sutures and 4-0  Vicryl subcuticular closure in layers. ?   ?The other side was addressed similarly.  Care was taken to avoid the port on the left side.   ?   ?A second drain was then placed on the left.   ? ?Sterile dressings were applied. At the end of the operation, all sponge, instrument, and needle counts were correct. ? ?Findings: ?grossly clear surgical margins ? ?Estimated Blood Loss: 50 mL  ?        ?Drains: 19 Fr blake drain in right chest wall and one in the left chest wall ?               ?Specimens: right breast and left breast. ?        ?Complications:  None; patient tolerated the procedure well. ?        ?Disposition: PACU - hemodynamically stable. ?        ?Condition: stable ? ?

## 2022-02-25 NOTE — Discharge Instructions (Addendum)
[facility], [place] ?[phone] ? ?MASTECTOMY: POST OP INSTRUCTIONS ? ?Always review your discharge instruction sheet given to you by the facility where your surgery was performed. ?IF YOU HAVE DISABILITY OR FAMILY LEAVE FORMS, YOU MUST BRING THEM TO THE OFFICE FOR PROCESSING.   ?DO NOT GIVE THEM TO YOUR DOCTOR. ?Take 2 tylenol (acetominophen) three times a day for 3 days.  If you still have pain, add ibuprofen with food in between if able to take this (if you have kidney  ?issues or stomach issues, do not take ibuprofen).  There is also a muscle relaxant for muscle spasms.   ? ?I have written a script for gabapentin.  This is helpful for burning type pain and is useful for several weeks to take twice daily.  ? ?If both of those are not enough, add the narcotic pain pill.   ? ?If you find you are needing a lot of this overnight after surgery, call the next morning for a refill.   ? ?Take your usually prescribed medications unless otherwise directed. ?If you need a refill on your pain medication, please contact your pharmacy.  They will contact our office to request authorization.  Prescriptions will not be filled after 5pm or on week-ends. ?You should follow a light diet the first few days after arrival home, such as soup and crackers, etc.  Resume your normal diet the day after surgery. ?Most patients will experience some swelling and bruising on the chest and underarm.  Ice packs will help.  Swelling and bruising can take several days to resolve.  ?It is common to experience some constipation if taking pain medication after surgery.  Increasing fluid intake and taking a stool softener (such as Colace) will usually help or prevent this problem from occurring.  A mild laxative (Milk of Magnesia or Miralax) should be taken according to package instructions if there are no bowel movements after 48 hours. ?You may remove the gauze from under the binder and replace as needed.  Keep the drains pinned  under the binder unless you are emptying them.  SHORT showers only are permitted after post op day 3. Cover the drains with saran wrap. It is ok if they get a little damp, just pat them dry afterward.   ?DRAINS:  If you have drains in place, it is important to keep a list of the amount of drainage produced each day in your drains.  Before leaving the hospital, you should be instructed on drain care.  Call our office if you have any questions about your drains. ?ACTIVITIES:  You may resume regular (light) daily activities beginning the next day--such as daily self-care, walking, climbing stairs--gradually increasing activities as tolerated.  You may have sexual intercourse when it is comfortable.  Refrain from any heavy lifting or straining until approved by your doctor. ?You may drive when you are no longer taking prescription pain medication, you can comfortably wear a seatbelt, and you can safely maneuver your car and apply brakes. ?RETURN TO WORK:  __________________________________________________________ ?You should see your doctor in the office for a follow-up appointment approximately 3-5 days after your surgery.  Your doctor?s nurse will typically make your follow-up appointment when she calls you with your pathology report.  Expect your pathology report 2-3 business days after your surgery.  You may call to check if you do not hear from [place] after three days.   ?OTHER INSTRUCTIONS: ______________________________________________________________________________________________ ____________________________________________________________________________________________ ?WHEN TO CALL YOUR DOCTOR: ?Fever over 101.0 ?Nausea and/or vomiting ?Extreme  swelling or bruising ?Continued bleeding from incision. ?Increased pain, redness, or drainage from the incision. ?The clinic staff is available to answer your questions during regular business hours.  Please don?t hesitate to call and ask to speak to one of the nurses  for clinical concerns.  If you have a medical emergency, go to the nearest emergency room or call 911.  A surgeon from [facility] is always on call at the hospital. ?[address] [place], [place], [place]  [address] ? [address], [place], [place]   [address] ?[phone] ? [phone] ? FAX [phone] ?Web site: [email or web address] ? ?

## 2022-02-25 NOTE — Progress Notes (Addendum)
Patient has a history of seizures after surgery per anesthesia report. Suction, oxygen, '2mg'$  of Versed, and '2mg'$  of Ativan were set up at the bedside prior to the patient arriving in the PACU from the OR.  ? ?At 1207 the patient stated that she was having an aura and felt as though a seizure was coming on. I immediately called Woodrum, MD who came to the bedside to evaluate the patient. Grand mal seizure began at 1212 and lasted about thirty seconds. The '2mg'$  of Ativan and '2mg'$  of Versed were given at the bedside per [facility], MD's orders.The patient displayed staring, limb-jerking, and appeared briefly cyanotic as her SpO2 dropped to 86% on room air. The patient's surroundings were cleared and safe in the bed. A nasal cannula was applied to the patient at 10L and the patient's SpO2 returned to 100%. Patient's cyanosis subsided.The patient's blood pressure was 122/77, MAP 92, RR 13, SpO2 100% immediately following the seizure.  ? ?Lanetta Inch, MD to re-evaluate the patient at 48. Patient's respirations are even and unlabored with VSS. Patient suffered no harm as a result of the seizure. ?

## 2022-02-25 NOTE — Anesthesia Procedure Notes (Signed)
Anesthesia Regional Block: Pectoralis block  ? ?Pre-Anesthetic Checklist: , timeout performed,  Correct Patient, Correct Site, Correct Laterality,  Correct Procedure, Correct Position, site marked,  Risks and benefits discussed,  Surgical consent,  Pre-op evaluation,  At surgeon's request and post-op pain management ? ?Laterality: Right ? ?Prep: Maximum Sterile Barrier Precautions used, chloraprep     ?  ?Needles:  ?Injection technique: Single-shot ? ?Needle Type: Echogenic Stimulator Needle   ? ? ?Needle Length: 9cm  ?Needle Gauge: 22  ? ? ? ?Additional Needles: ? ? ?Procedures:,,,, ultrasound used (permanent image in chart),,    ?Narrative:  ?Start time: 02/25/2022 8:33 AM ?End time: 02/25/2022 8:36 AM ?Injection made incrementally with aspirations every 5 mL. ? ?Performed by: Personally  ?Anesthesiologist: Freddrick March, MD ? ?Additional Notes: ?Monitors applied. No increased pain on injection. No increased resistance to injection. Injection made in 5cc increments. Good needle visualization. Patient tolerated procedure well.  ? ? ? ? ?

## 2022-02-26 ENCOUNTER — Encounter (HOSPITAL_COMMUNITY): Payer: Self-pay | Admitting: General Surgery

## 2022-02-26 DIAGNOSIS — F840 Autistic disorder: Secondary | ICD-10-CM | POA: Diagnosis not present

## 2022-02-27 DIAGNOSIS — F4312 Post-traumatic stress disorder, chronic: Secondary | ICD-10-CM | POA: Diagnosis not present

## 2022-02-27 DIAGNOSIS — F840 Autistic disorder: Secondary | ICD-10-CM | POA: Diagnosis not present

## 2022-02-27 NOTE — Anesthesia Postprocedure Evaluation (Signed)
Anesthesia Post Note ? ?Patient: [name] ? ?Procedure(s) Performed: BILATERAL NIPPLE-SPARING MASTECTOMY (Bilateral: Breast) ? ?  ? ?Patient location during evaluation: PACU ?Anesthesia Type: Regional and General ?Level of consciousness: awake and alert ?Pain management: pain level controlled ?Vital Signs Assessment: post-procedure vital signs reviewed and stable ?Respiratory status: spontaneous breathing, nonlabored ventilation, respiratory function stable and patient connected to nasal cannula oxygen ?Cardiovascular status: blood pressure returned to baseline and stable ?Postop Assessment: no apparent nausea or vomiting ?Anesthetic complications: no ?Comments: Pt experienced multiple seizures in PACU similar to all her previous anesthetics. Treated with versed and ativan. Longest seizure lasted ~1 min. No respiratory compromise. Vital signs remained stable. After final seizure, she was monitored in PACU for ~1 hour. She was discharged home in stable condition.  ? ? ?No notable events documented. ? ?Last Vitals:  ?Vitals:  ? 02/25/22 1645 02/25/22 1715  ?BP: 119/71 122/77  ?Pulse: 81 68  ?Resp: 20 18  ?Temp: 36.7 ?C   ?SpO2: 96% 100%  ?  ?Last Pain:  ?Vitals:  ? 02/25/22 1722  ?TempSrc:   ?PainSc: 7   ? ?Pain Goal: Patients Stated Pain Goal: 3 (02/25/22 1130) ? ?  ?  ?  ?  ?  ?  ?  ? ?Gearlene Godsil L Vernona Peake ? ? ? ? ?

## 2022-02-28 DIAGNOSIS — Z9013 Acquired absence of bilateral breasts and nipples: Secondary | ICD-10-CM | POA: Insufficient documentation

## 2022-02-28 DIAGNOSIS — F840 Autistic disorder: Secondary | ICD-10-CM | POA: Diagnosis not present

## 2022-03-01 NOTE — Discharge Summary (Addendum)
Physician Discharge Summary  ?Patient ID: ?[name] ?MRN: [id number] ?DOB/AGE: Jun 14, 1991 31 y.o. ? ?Admit date: 02/25/2022 ?Discharge date: 02/25/2022 ? ?Admission Diagnoses: ?BRCA 2 ? ?Asthma ?Seizure disorder ?MERFF mitochondrial disorder ?H/o cellultiis and sepsis post op from other procedures.  ? ?Discharge Diagnoses:  ?BRCA 2 ?And same as above ? ?Discharged Condition: stable ? ?Hospital Course:  ?Pt had bilateral nipple sparing mastectomies without reconstruction 02/25/22 for BRCA 2 defect.  She was observed for several hours in the recovery room.  As anticipated, she had several seizures post op (normal for her). She had drain teaching and was discharged in stable condition.  ? ?Consults: None ? ?Significant Diagnostic Studies: none new ? ?Treatments: surgery: see above ? ?Discharge Exam: ?Blood pressure 122/77, pulse 68, temperature 98 ?F (36.7 ?C), resp. rate 18, height 5' 5" (1.651 m), weight 48.1 kg, last menstrual period 02/11/2021, SpO2 100 %. ?General appearance: a little sleepy c/w sz meds and post ictal state, again normal for her ?Resp: breathing comfortably ?Chest wall: bilateral tenderness as expected, drain output serosang. ? ?Disposition:  ? ? ?Allergies as of 02/25/2022   ? ?   Reactions  ? Cefzil [cefprozil] Anaphylaxis, Shortness Of Breath, Rash  ? Cephalosporins Anaphylaxis, Shortness Of Breath, Rash  ? Latex Shortness Of Breath, Rash  ? Penicillins Anaphylaxis, Hives, Rash, Other (See Comments)  ? Tongue swells, throat does not close ?Did it involve swelling of the face/tongue/throat, SOB, or low BP? No ?Did it involve sudden or severe rash/hives, skin peeling, or any reaction on the inside of your mouth or nose? Yes ?Did you need to seek medical attention at a hospital or doctor's office? No ?When did it last happen?      4-5 years ?If all above answers are "NO", may proceed with cephalosporin use.  ? Shellfish Allergy Hives, Rash  ? Grass Pollen(k-o-r-t-swt Vern) Hives  ? Morphine And  Related Nausea And Vomiting  ? Severe n/v  ? Tegaderm Ag Mesh [silver] Rash  ? Vancomycin Hives, Itching, Rash  ? ?  ? ?  ?Medication List  ?  ? ?TAKE these medications   ? ?acetaminophen 325 MG tablet ?Commonly known as: Tylenol ?Take 2 tablets (650 mg total) by mouth every 6 (six) hours as needed (pain). ?  ?Adapalene 0.3 % gel ?Apply 1 application topically daily as needed (acne). Alternate when run out ?  ?albuterol 108 (90 Base) MCG/ACT inhaler ?Commonly known as: VENTOLIN HFA ?Inhale 2 puffs into the lungs every 6 (six) hours as needed for wheezing or shortness of breath. ?  ?buPROPion 150 MG 24 hr tablet ?Commonly known as: WELLBUTRIN XL ?Take 150-300 mg by mouth See admin instructions. 300 mg twice daily (Morning and Bedtime) 150 mg at Lunch time ?  ?busPIRone 10 MG tablet ?Commonly known as: BUSPAR ?Take 10 mg by mouth 2 (two) times daily. ?  ?clindamycin 1 % lotion ?Commonly known as: CLEOCIN T ?Apply 1 application topically as needed (Acne). Apply to affected areas of the face 2 times a day ?  ?CoQ10 100 MG Caps ?Take 100 mg by mouth daily. ?  ?Dapsone 5 % topical gel ?Apply 1 application topically daily as needed (Acne). ?  ?Dexcom G6 Transmitter Misc ?Use with dexcom receiver and sensor ?  ?Dexcom G7 Sensor Misc ?1 Device by Does not apply route once a week. Change every 10 days ?  ?diazepam 5 MG tablet ?Commonly known as: Valium ?Take 1 tablet (5 mg total) by mouth every 8 (eight) hours as  needed for muscle spasms. ?  ?diphenhydrAMINE 25 MG tablet ?Commonly known as: BENADRYL ?Take 50 mg by mouth at bedtime. ?  ?doxycycline 100 MG capsule ?Commonly known as: VIBRAMYCIN ?Take 1 capsule (100 mg total) by mouth 2 (two) times daily. ?  ?gabapentin 300 MG capsule ?Commonly known as: NEURONTIN ?Take 1 capsule (300 mg total) by mouth 2 (two) times daily for 15 days. ?What changed: when to take this ?  ?Glucagon Emergency 1 MG Kit ?Use PRN for hypoglycemia ?  ?glucose monitoring kit monitoring kit ?1 each by  Does not apply route as needed for other. Free style libre on right arm ?  ?levETIRAcetam 500 MG tablet ?Commonly known as: KEPPRA ?Take 1 tablet three times a day ?What changed:  ?how much to take ?when to take this ?additional instructions ?  ?LORazepam 0.5 MG tablet ?Commonly known as: ATIVAN ?Take 1 tablet as needed for seizure. ?What changed:  ?how much to take ?how to take this ?when to take this ?reasons to take this ?additional instructions ?  ?Melatonin 5 MG Chew ?Chew 25 mg by mouth at bedtime. Gummies ?  ?montelukast 10 MG tablet ?Commonly known as: SINGULAIR ?Take 1 tablet (10 mg total) by mouth at bedtime. ?  ?oxyCODONE 5 MG immediate release tablet ?Commonly known as: Oxy IR/ROXICODONE ?Take 1-2 tablets (5-10 mg total) by mouth every 6 (six) hours as needed. ?  ?perampanel 2 MG tablet ?Commonly known as: FYCOMPA ?Take 4 mg by mouth at bedtime. ?  ?spironolactone 50 MG tablet ?Commonly known as: ALDACTONE ?Take 50 mg by mouth at bedtime. ?  ?Vimpat 100 MG Tabs ?Generic drug: Lacosamide ?Take 1 tablet (100 mg total) by mouth 2 (two) times daily. ?What changed: when to take this ?  ? ?  ? ? Follow-up Information   ? ? Stark Klein, MD Follow up on 03/03/2022.   ?Specialty: General Surgery ?Why: as scheduled ?Contact information: ?[address] ?[address] ?[place] [address] ?[phone] ? ? ?  ?  ? ?  ?  ? ?  ? ? ?Signed: ?Stark Klein ?03/01/2022, 10:22 AM ? ? ?

## 2022-03-03 ENCOUNTER — Encounter: Payer: Self-pay | Admitting: General Surgery

## 2022-03-03 DIAGNOSIS — F4312 Post-traumatic stress disorder, chronic: Secondary | ICD-10-CM | POA: Diagnosis not present

## 2022-03-03 DIAGNOSIS — F840 Autistic disorder: Secondary | ICD-10-CM | POA: Diagnosis not present

## 2022-03-04 DIAGNOSIS — F4312 Post-traumatic stress disorder, chronic: Secondary | ICD-10-CM | POA: Diagnosis not present

## 2022-03-04 DIAGNOSIS — F840 Autistic disorder: Secondary | ICD-10-CM | POA: Diagnosis not present

## 2022-03-04 LAB — SURGICAL PATHOLOGY

## 2022-03-05 DIAGNOSIS — F4312 Post-traumatic stress disorder, chronic: Secondary | ICD-10-CM | POA: Diagnosis not present

## 2022-03-06 DIAGNOSIS — F4312 Post-traumatic stress disorder, chronic: Secondary | ICD-10-CM | POA: Diagnosis not present

## 2022-03-07 ENCOUNTER — Other Ambulatory Visit: Payer: Self-pay | Admitting: Neurology

## 2022-03-07 DIAGNOSIS — G40019 Localization-related (focal) (partial) idiopathic epilepsy and epileptic syndromes with seizures of localized onset, intractable, without status epilepticus: Secondary | ICD-10-CM

## 2022-03-07 DIAGNOSIS — F4312 Post-traumatic stress disorder, chronic: Secondary | ICD-10-CM | POA: Diagnosis not present

## 2022-03-08 ENCOUNTER — Encounter: Payer: Self-pay | Admitting: Surgery

## 2022-03-08 DIAGNOSIS — Z1501 Genetic susceptibility to malignant neoplasm of breast: Secondary | ICD-10-CM | POA: Insufficient documentation

## 2022-03-08 DIAGNOSIS — D849 Immunodeficiency, unspecified: Secondary | ICD-10-CM | POA: Insufficient documentation

## 2022-03-09 DIAGNOSIS — F4312 Post-traumatic stress disorder, chronic: Secondary | ICD-10-CM | POA: Diagnosis not present

## 2022-03-10 ENCOUNTER — Encounter: Payer: Self-pay | Admitting: Adult Health

## 2022-03-10 ENCOUNTER — Telehealth: Payer: Self-pay | Admitting: Neurology

## 2022-03-10 NOTE — Telephone Encounter (Signed)
Pt called an informed that her prescription was sent in  ?

## 2022-03-10 NOTE — Telephone Encounter (Signed)
Patient called and stated she is out of the medication Ativan.  She needs it asap.   ?

## 2022-03-11 ENCOUNTER — Encounter: Payer: Self-pay | Admitting: Adult Health

## 2022-03-11 ENCOUNTER — Telehealth (INDEPENDENT_AMBULATORY_CARE_PROVIDER_SITE_OTHER): Payer: BC Managed Care – PPO | Admitting: Adult Health

## 2022-03-11 DIAGNOSIS — Z79899 Other long term (current) drug therapy: Secondary | ICD-10-CM | POA: Diagnosis not present

## 2022-03-11 DIAGNOSIS — F4312 Post-traumatic stress disorder, chronic: Secondary | ICD-10-CM | POA: Diagnosis not present

## 2022-03-11 NOTE — Telephone Encounter (Signed)
Pt has been scheduled.  °

## 2022-03-11 NOTE — Progress Notes (Signed)
Virtual Visit via Video Note ? ?I connected with [name]  on 03/11/22 at  5:00 PM EDT by a video enabled telemedicine application and verified that I am speaking with the correct person using two identifiers. ? Location patient: home ?Location provider:work or home office ?Persons participating in the virtual visit: patient, provider ? ?I discussed the limitations of evaluation and management by telemedicine and the availability of in person appointments. The patient expressed understanding and agreed to proceed. ? ? ?HPI: ?31 year old female who is being evaluated today for medication management.  She currently is prescribed Ativan 1.5 mg as needed for after seizure activity.  This was prescribed by her neurologist.  Since her recent mastectomies she was having more seizures than normal.  She found that taking 1 mg of Ativan nightly helped her sleep, help with anxiety, and also help prevent seizures.  She is wondering if I can take over prescribing Ativan ? ? ?ROS: See pertinent positives and negatives per HPI. ? ?Past Medical History:  ?Diagnosis Date  ? Acne   ? Anemia   ? hx of  ? Anxiety   ? Asthma   ? Carpal tunnel syndrome   ? right  ? Complex partial seizures ([place])   ? last seizure 02-20-3021  ? Complication of anesthesia   ? slow to wake up, disoriented, hx of  mild seizure after surgery -2019 gallbladder   ? Coordination problem   ? Cyst of brain   ? states is collection of scar tissue - was kicked by a horse as a teenager  ? Difficult intravenous access   ? RIGHT CHEST PAC  ? Eating disorder   ? Gallstones   ? GERD (gastroesophageal reflux disease)   ? Head injury, intracranial, with concussion   ? Hearing loss   ? both ears  ? Heart murmur   ? states no known problems, no cardiologist  ? History of asthma   ? no current med.  ? History of spider veins   ? both legs   ? Hypoglycemia   ? Joint pain   ? 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 Migraines   ? Ovarian cyst   ? Right  ?  Pneumonia 12/2018  ? hx of   ? PONV (postoperative nausea and vomiting)   ? PTSD (post-traumatic stress disorder)   ? PTSD (post-traumatic stress disorder)   ? Shoulder dislocation 09/2018  ? Left  ? Shoulder subluxation, left   ? Syncope   ? Tremor, unspecified   ? bilateral arms  ? Wears glasses   ? Wears hearing aid   ? bilateral  ? ? ?Past Surgical History:  ?Procedure Laterality Date  ? ABDOMINAL HYSTERECTOMY    ? APPENDECTOMY    ? CHOLECYSTECTOMY N/A 10/05/2018  ? Procedure: LAPAROSCOPIC CHOLECYSTECTOMY WITH INTRAOPERATIVE CHOLANGIOGRAM ERAS PATHWAY;  Surgeon: Alphonsa Overall, MD;  Location: [facility];  Service: General;  Laterality: N/A;  ? CYSTOSCOPY N/A 03/12/2021  ? Procedure: CYSTOSCOPY;  Surgeon: Sherlyn Hay, DO;  Location: [place];  Service: Gynecology;  Laterality: N/A;  ? LAPAROSCOPIC APPENDECTOMY  11/26/2011  ? Procedure: APPENDECTOMY LAPAROSCOPIC;  Surgeon: Gayland Curry, MD;  Location: [place];  Service: General;  Laterality: N/A;  ? left shoulder manipulation    ? in er multilple times last done july 2021  ? NIPPLE SPARING MASTECTOMY Bilateral 02/25/2022  ? Procedure: BILATERAL NIPPLE-SPARING MASTECTOMY;  Surgeon: Stark Klein, MD;  Location: [place];  Service: General;  Laterality:  Bilateral;  ? PORT-A-CATH REMOVAL N/A 12/10/2021  ? Procedure: REMOVAL PORT-A-CATH AND REPLACEMENT;  Surgeon: [name], MD;  Location: [place];  Service: General;  Laterality: N/A;  ? PORTACATH PLACEMENT Right 08/28/2017  ? Procedure: POWER PORT PLACEMENT;  Surgeon: Alphonsa Overall, MD;  Location: [place];  Service: General;  Laterality: Right;  ? PORTACATH PLACEMENT N/A 12/04/2017  ? Procedure: INSERTION PORT-A-CATH;  Surgeon: Alphonsa Overall, MD;  Location: [place];  Service: General;  Laterality: N/A;  ? RADIOLOGY WITH ANESTHESIA Left 10/25/2019  ? Procedure: MRI  LEFT SHOLDER WITHOUT CONTRAST;  Surgeon: Radiologist, Medication, MD;  Location: [place];  Service: Radiology;  Laterality: Left;  ?  RADIOLOGY WITH ANESTHESIA Bilateral 11/19/2021  ? Procedure: MRI WITH ANESTHESIA OF BILATERAL BREASTS WITH AND WITHOUT CONTRAST;  Surgeon: Radiologist, Medication, MD;  Location: [place];  Service: Radiology;  Laterality: Bilateral;  ? REPAIR ANKLE LIGAMENT Left   ? x 3  ? TOTAL LAPAROSCOPIC HYSTERECTOMY WITH SALPINGECTOMY Bilateral 03/12/2021  ? Procedure: TOTAL LAPAROSCOPIC HYSTERECTOMY WITH SALPINGECTOMY;  Surgeon: Sherlyn Hay, DO;  Location: [place];  Service: Gynecology;  Laterality: Bilateral;  ? ? ?Family History  ?Problem Relation Age of Onset  ? Polymyalgia rheumatica Mother   ? Heart disease Mother   ? Sudden Cardiac Death Father   ?     long QT syndrome   ? Heart disease Maternal Grandmother   ? Breast cancer Maternal Grandmother   ? Heart disease Maternal Grandfather   ? Stroke Maternal Grandfather   ? Diabetes Maternal Grandfather   ? COPD Maternal Grandfather   ? Kidney failure Maternal Grandfather   ? Heart disease Paternal Grandmother   ? Heart disease Paternal Grandfather   ? Migraines Paternal Grandfather   ? ? ? ? ? ?Current Outpatient Medications:  ?  acetaminophen (TYLENOL) 325 MG tablet, Take 2 tablets (650 mg total) by mouth every 6 (six) hours as needed (pain)., Disp: , Rfl:  ?  Adapalene 0.3 % gel, Apply 1 application topically daily as needed (acne). Alternate when run out, Disp: , Rfl:  ?  albuterol (VENTOLIN HFA) 108 (90 Base) MCG/ACT inhaler, Inhale 2 puffs into the lungs every 6 (six) hours as needed for wheezing or shortness of breath., Disp: 6.7 g, Rfl: 3 ?  buPROPion (WELLBUTRIN XL) 150 MG 24 hr tablet, Take 150-300 mg by mouth See admin instructions. 300 mg twice daily (Morning and Bedtime) 150 mg at Lunch time, Disp: , Rfl:  ?  busPIRone (BUSPAR) 10 MG tablet, Take 10 mg by mouth 2 (two) times daily., Disp: , Rfl:  ?  clindamycin (CLEOCIN T) 1 % lotion, Apply 1 application topically as needed (Acne). Apply to affected areas of the face 2 times a day, Disp: , Rfl: 1 ?   Coenzyme Q10 (COQ10) 100 MG CAPS, Take 100 mg by mouth daily., Disp: , Rfl:  ?  Continuous Blood Gluc Sensor (DEXCOM G7 SENSOR) MISC, 1 Device by Does not apply route once a week. Change every 10 days, Disp: 3 each, Rfl: 11 ?  Continuous Blood Gluc Transmit (DEXCOM G6 TRANSMITTER) MISC, Use with dexcom receiver and sensor, Disp: 1 each, Rfl: 3 ?  Dapsone 5 % topical gel, Apply 1 application topically daily as needed (Acne)., Disp: , Rfl:  ?  diazepam (VALIUM) 5 MG tablet, Take 1 tablet (5 mg total) by mouth every 8 (eight) hours as needed for muscle spasms., Disp: 45 tablet, Rfl: 1 ?  diphenhydrAMINE (BENADRYL) 25 MG  tablet, Take 50 mg by mouth at bedtime., Disp: , Rfl:  ?  doxycycline (VIBRAMYCIN) 100 MG capsule, Take 1 capsule (100 mg total) by mouth 2 (two) times daily., Disp: 30 capsule, Rfl: 0 ?  gabapentin (NEURONTIN) 300 MG capsule, Take 1 capsule (300 mg total) by mouth 2 (two) times daily for 15 days., Disp: 30 capsule, Rfl: 0 ?  Glucagon, rDNA, (GLUCAGON EMERGENCY) 1 MG KIT, Use PRN for hypoglycemia, Disp: 1 kit, Rfl: 3 ?  glucose monitoring kit (FREESTYLE) monitoring kit, 1 each by Does not apply route as needed for other. Free style libre on right arm, Disp: , Rfl:  ?  Lacosamide (VIMPAT) 100 MG TABS, Take 1 tablet (100 mg total) by mouth 2 (two) times daily. (Patient taking differently: Take 100 mg by mouth at bedtime.), Disp: 60 tablet, Rfl: 5 ?  levETIRAcetam (KEPPRA) 500 MG tablet, Take 1 tablet three times a day (Patient taking differently: 500 mg at bedtime.), Disp: 90 tablet, Rfl: 11 ?  LORazepam (ATIVAN) 0.5 MG tablet, TAKE 1 TABLET AS NEEDED FOR SEIZURE., Disp: 10 tablet, Rfl: 5 ?  Melatonin 5 MG CHEW, Chew 25 mg by mouth at bedtime. Gummies, Disp: , Rfl:  ?  montelukast (SINGULAIR) 10 MG tablet, Take 1 tablet (10 mg total) by mouth at bedtime., Disp: 90 tablet, Rfl: 3 ?  oxyCODONE (OXY IR/ROXICODONE) 5 MG immediate release tablet, Take 1-2 tablets (5-10 mg total) by mouth every 6 (six) hours as  needed., Disp: 30 tablet, Rfl: 0 ?  perampanel (FYCOMPA) 2 MG tablet, Take 4 mg by mouth at bedtime., Disp: , Rfl:  ?  spironolactone (ALDACTONE) 50 MG tablet, Take 50 mg by mouth at bedtime., Disp: , Rfl:  ? ?[place]

## 2022-03-12 DIAGNOSIS — F4312 Post-traumatic stress disorder, chronic: Secondary | ICD-10-CM | POA: Diagnosis not present

## 2022-03-13 DIAGNOSIS — F4312 Post-traumatic stress disorder, chronic: Secondary | ICD-10-CM | POA: Diagnosis not present

## 2022-03-18 DIAGNOSIS — F4312 Post-traumatic stress disorder, chronic: Secondary | ICD-10-CM | POA: Diagnosis not present

## 2022-03-18 DIAGNOSIS — F410 Panic disorder [episodic paroxysmal anxiety] without agoraphobia: Secondary | ICD-10-CM | POA: Diagnosis not present

## 2022-03-18 DIAGNOSIS — F840 Autistic disorder: Secondary | ICD-10-CM | POA: Diagnosis not present

## 2022-03-20 DIAGNOSIS — F4312 Post-traumatic stress disorder, chronic: Secondary | ICD-10-CM | POA: Diagnosis not present

## 2022-03-21 DIAGNOSIS — F4312 Post-traumatic stress disorder, chronic: Secondary | ICD-10-CM | POA: Diagnosis not present

## 2022-03-31 DIAGNOSIS — F4312 Post-traumatic stress disorder, chronic: Secondary | ICD-10-CM | POA: Diagnosis not present

## 2022-04-01 DIAGNOSIS — F4312 Post-traumatic stress disorder, chronic: Secondary | ICD-10-CM | POA: Diagnosis not present

## 2022-04-03 DIAGNOSIS — F4312 Post-traumatic stress disorder, chronic: Secondary | ICD-10-CM | POA: Diagnosis not present

## 2022-04-03 DIAGNOSIS — F840 Autistic disorder: Secondary | ICD-10-CM | POA: Diagnosis not present

## 2022-04-05 DIAGNOSIS — F840 Autistic disorder: Secondary | ICD-10-CM | POA: Diagnosis not present

## 2022-04-05 DIAGNOSIS — F4312 Post-traumatic stress disorder, chronic: Secondary | ICD-10-CM | POA: Diagnosis not present

## 2022-04-06 ENCOUNTER — Other Ambulatory Visit: Payer: Self-pay | Admitting: Neurology

## 2022-04-07 DIAGNOSIS — F840 Autistic disorder: Secondary | ICD-10-CM | POA: Diagnosis not present

## 2022-04-07 DIAGNOSIS — F4312 Post-traumatic stress disorder, chronic: Secondary | ICD-10-CM | POA: Diagnosis not present

## 2022-04-08 DIAGNOSIS — F4312 Post-traumatic stress disorder, chronic: Secondary | ICD-10-CM | POA: Diagnosis not present

## 2022-04-12 DIAGNOSIS — F4312 Post-traumatic stress disorder, chronic: Secondary | ICD-10-CM | POA: Diagnosis not present

## 2022-04-14 ENCOUNTER — Encounter: Payer: Self-pay | Admitting: Neurology

## 2022-04-14 ENCOUNTER — Ambulatory Visit: Payer: BC Managed Care – PPO | Admitting: Neurology

## 2022-04-14 VITALS — BP 124/84 | HR 64 | Ht 65.0 in | Wt 102.0 lb

## 2022-04-14 DIAGNOSIS — M791 Myalgia, unspecified site: Secondary | ICD-10-CM

## 2022-04-14 DIAGNOSIS — E8840 Mitochondrial metabolism disorder, unspecified: Secondary | ICD-10-CM

## 2022-04-14 DIAGNOSIS — G40019 Localization-related (focal) (partial) idiopathic epilepsy and epileptic syndromes with seizures of localized onset, intractable, without status epilepticus: Secondary | ICD-10-CM | POA: Diagnosis not present

## 2022-04-14 DIAGNOSIS — F4312 Post-traumatic stress disorder, chronic: Secondary | ICD-10-CM | POA: Diagnosis not present

## 2022-04-14 MED ORDER — LEVETIRACETAM 500 MG PO TABS: ORAL_TABLET | ORAL | 11 refills | Status: DC

## 2022-04-14 MED ORDER — FYCOMPA 4 MG PO TABS: 8.0000 mg | ORAL_TABLET | Freq: Every evening | ORAL | 5 refills | Status: DC

## 2022-04-14 MED ORDER — LACOSAMIDE 100 MG PO TABS: ORAL_TABLET | ORAL | 5 refills | Status: DC

## 2022-04-14 MED ORDER — LORAZEPAM 0.5 MG PO TABS: ORAL_TABLET | ORAL | 5 refills | Status: DC

## 2022-04-14 NOTE — Progress Notes (Signed)
? ?NEUROLOGY FOLLOW UP OFFICE NOTE ? ?[name] ?[id number] ?22-Jun-1991 ? ?HISTORY OF PRESENT ILLNESS: ?I had the pleasure of seeing [name] in follow-up in the neurology clinic on 04/14/2022.  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 She is alone in the office today. Records and images were personally reviewed where available.  She had contacted our office in 11/2021 about worse insomnia and anxiety since ovary removal. Due to sleep deprivation, she was having more seizures, her mother reported 3 short complex partial seizures lasting 30 seconds over a minute apart. She recalls trying different sleep aids with her psychiatrist, but developed side effects. The insomnia quieted down on it's own and sleep was better, she was doing much better with her seizures and reports going 4 months seizure-free until she had prophylactic bilateral mastectomy on 02/25/2022 due to BRCA2 mutation and strong family history of breast/ovarian cancer. Since the surgery, she has had difficulty with sleep maintenance. She has been taking Benadryl every night, otherwise she would wake up at 2:30am. Since the surgery, she has also been having "really tiny seizures" where she feels the aura or would have a 20-second blank stare. She has been taking the prn lorazepam 3-4 times a week and states it really helps nix them. 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 She endorses pretty severe anxiety and sees Psychiatry. She lives with her mother.  ? ?The migraines are better, they are not occurring as much. She was previously on nortriptyline for migraine prophylaxis. Her main concern are the diffuse muscle pains and spasms. She has significant muscle soreness that is not equivalent to the amount of activity she has done. Over the past 8 months, she finds herself  vomiting after running in the morning, even if she has not eaten breakfast. She has been having more difficulty exercising like before. She carries a diagnosis of MERRF and reported having a muscle biopsy and elevated lactic acid at the [facility] (we have tried to obtain these results to no avail). She has been reading about what to do and has been taking CoQ10 and feels it helps a little bit.  ? ? 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 (report unavailable for review), who presented for a second opinion. She reports that she had a lot of instances of "spacing out" as a teenager, but was not diagnosed with seizures until she had her first generalized tonic-clonic seizure at age 31.107he initially did not recognize any warning symptoms, but now feels lightheaded/dizzy "like a white noise," both arms are numb and tingly, then loses consciousness. Witnesses have told her she about complex partial seizures where her eyes are open, she would vocalize, smack her lips, and fidget. After a seizure, her left side feels weaker. She reports that GTCs mostly occur when she is sick or sleep deprived, last GTC was in June. She usually has a couple of complex partial seizures before having a convulsion. One time she had 4 GTCs in a week. She has dislocated her shoulder "hundreds of times" since age 31,67articularly  the left shoulder. She has frequent focal seizures occurring at least once a week for the past 1.5-2 years. She reports they cluster for several days in a row, sometimes a couple of times a day. She had one yesterday and the day prior. Previous to this was a week ago. She has been travelling recently and has more  seizures when stressed or sleep deprived. She has noticed that sometimes one of her limbs would jerk a little bit. She would have occasional brief twitching in her arms like a tremor with no associated aura or convulsion, more in the evening hours. She has nocturnal seizures where she wakes up with urinary incontinence every 2 weeks or so. She had tried Tegretol in the past, which was ineffective. She is currently on Lamotrigine 227m BID, Keppra 1000, 10028m 50026mand Vimpat 32m34mD. She has been taking Lamotrigine since age 31, 86 side effects. She feels different if she takes it an hour late. She has been on Keppra since age 31, 7d Vimpat was started 1.5 years ago. She feels Vimpat has been the most helpful by far, with less severe convulsions. She gets a little dizzy and nauseated after taking Vimpat. She had a VNS placed for 3.5 weeks, then had it taken out because there were no resources [place] was told she had to go to [place] 2 hours away to interrogate the device, and special permission was needed for people in school to help swipe her magnet. ? ?She occasionally has gustatory hallucinations of tasting cheese. She gets a lot of nausea but they are not clearly related to the seizures. For the past couple of years, she has headaches a couple of times a week, usually over the frontal and temporal regions where she gets dizzy, vision "gets funky," with nausea and light sensitivity (sometimes triggering headaches).  Naproxen helps. Pain is intense for about 20-30 minutes, then hurts for hours. She has neck and back pain. No clear relation to menstrual cycle, her periods are very irregular, she reports amenorrhea for 4 years at one point and has not had this checked. She constantly feels her left side has less sensation, she has difficulty typing with her left hand. She is [place] in [place], but has a lot of short term memory issues that affect  her classes. She has some accommodations where lessons are transcribed for her. She has been having a lot of difficulties with her professors in school. She has a seizure dog who can detect the GTCs but only detect the complex partial seizure "50% of the time." She is concerned that she is getting sick all the time, she is sick at least once a month "I catch everything," and has started to have epistaxis.  ? ?Epilepsy Risk Factors:  MERRF. She reports her sister was tested and is a carrier. Otherwise she had a normal birth and early development.  There is no history of febrile convulsions, CNS infections such as meningitis/encephalitis, significant traumatic brain injury, neurosurgical procedures, or family history of seizures. ? ?Prior AEDs: Tegretol, Zonisamide, VNS ([place] resources [place], gabapentin (dizziness, nausea) ? ?EEGs: 08/17/2009 at [facility] a normal wake EEG ?09/09/2009 24-hour EEG at [facility] was normal. Pushbutton events not associated with EEG change ?MRI: MRI brain without contrast done 04/17/2011 reported as normal, images unavailable for review ?  ? ?PAST MEDICAL HISTORY: ?Past Medical History:  ?Diagnosis Date  ?  Acne   ? Anemia   ? hx of  ? Anxiety   ? Asthma   ? Carpal tunnel syndrome   ? right  ? Complex partial seizures ([place])   ? last seizure 02-20-3021  ? Complication of anesthesia   ? slow to wake up, disoriented, hx of  mild seizure after surgery -2019 gallbladder   ? Coordination problem   ? Cyst of brain   ? states is collection of scar tissue - was kicked by a horse as a teenager  ? Difficult intravenous access   ? RIGHT CHEST PAC  ? Eating disorder   ? Gallstones   ? GERD (gastroesophageal reflux disease)   ? Head injury, intracranial, with concussion   ? Hearing loss   ? both ears  ? Heart murmur   ? states no known problems, no cardiologist  ? History of asthma   ? no current med.  ? History of spider veins   ? both legs   ? Hypoglycemia   ? Joint pain   ? MERRF  (myoclonus epilepsy and ragged red fibers) (HCC)   ? Migraines   ? Ovarian cyst   ? Right  ? Pneumonia 12/2018  ? hx of   ? PONV (postoperative nausea and vomiting)   ? PTSD (post-traumatic stress disorder)

## 2022-04-14 NOTE — Patient Instructions (Signed)
Increase Vimpat '100mg'$ : take 1 tablet in AM, 2 tablets in PM ? ?2. Reduce Keppra '500mg'$ : take 1 tablet twice a day ? ?3. Continue Fymcompa '4mg'$ : take 2 tablets every night ? ?4. Referral will be sent to [facility] for the muscle aches and MERRF evaluation ? ?5. Minimize Ativan use to 2-3 a week ? ?6. Continue follow-up with Psychiatry for anxiety/sleep ? ?7. Follow-up in 6 months, call for any changes ? ? ?Seizure Precautions: ?1. If medication has been prescribed for you to prevent seizures, take it exactly as directed.  Do not stop taking the medicine without talking to your doctor first, even if you have not had a seizure in a long time.  ? ?2. Avoid activities in which a seizure would cause danger to yourself or to others.  Don't operate dangerous machinery, swim alone, or climb in high or dangerous places, such as on ladders, roofs, or girders.  Do not drive unless your doctor says you may. ? ?3. If you have any warning that you may have a seizure, lay down in a safe place where you can't hurt yourself.   ? ?4.  No driving for 6 months from last seizure, as per [facility]   Please refer to the following link on the [facility] website for more information: http://[email or web address]/[email or web address]/[email or web address]  ? ?5.  Maintain good sleep hygiene. ? ?6.  Contact your doctor if you have any problems that may be related to the medicine you are taking. ? ?7.  Call 911 and bring the patient back to the ED if: ?      ? A.  The seizure lasts longer than 5 minutes.      ? B.  The patient doesn't awaken shortly after the seizure ? C.  The patient has new problems such as difficulty seeing, speaking or moving ? D.  The patient was injured during the seizure ? E.  The patient has a temperature over 102 F (39C) ? F.  The patient vomited and now is having trouble breathing ?      ? ?

## 2022-04-15 DIAGNOSIS — F4312 Post-traumatic stress disorder, chronic: Secondary | ICD-10-CM | POA: Diagnosis not present

## 2022-04-17 DIAGNOSIS — F4312 Post-traumatic stress disorder, chronic: Secondary | ICD-10-CM | POA: Diagnosis not present

## 2022-04-17 DIAGNOSIS — F840 Autistic disorder: Secondary | ICD-10-CM | POA: Diagnosis not present

## 2022-04-21 DIAGNOSIS — F840 Autistic disorder: Secondary | ICD-10-CM | POA: Diagnosis not present

## 2022-04-21 DIAGNOSIS — F4312 Post-traumatic stress disorder, chronic: Secondary | ICD-10-CM | POA: Diagnosis not present

## 2022-04-22 DIAGNOSIS — F4312 Post-traumatic stress disorder, chronic: Secondary | ICD-10-CM | POA: Diagnosis not present

## 2022-04-24 DIAGNOSIS — F4312 Post-traumatic stress disorder, chronic: Secondary | ICD-10-CM | POA: Diagnosis not present

## 2022-04-28 DIAGNOSIS — F4312 Post-traumatic stress disorder, chronic: Secondary | ICD-10-CM | POA: Diagnosis not present

## 2022-04-28 DIAGNOSIS — F840 Autistic disorder: Secondary | ICD-10-CM | POA: Diagnosis not present

## 2022-04-29 DIAGNOSIS — F410 Panic disorder [episodic paroxysmal anxiety] without agoraphobia: Secondary | ICD-10-CM | POA: Diagnosis not present

## 2022-04-29 DIAGNOSIS — F4312 Post-traumatic stress disorder, chronic: Secondary | ICD-10-CM | POA: Diagnosis not present

## 2022-05-01 DIAGNOSIS — F4312 Post-traumatic stress disorder, chronic: Secondary | ICD-10-CM | POA: Diagnosis not present

## 2022-05-02 DIAGNOSIS — F4312 Post-traumatic stress disorder, chronic: Secondary | ICD-10-CM | POA: Diagnosis not present

## 2022-05-05 ENCOUNTER — Encounter: Payer: Self-pay | Admitting: Adult Health

## 2022-05-05 DIAGNOSIS — F4312 Post-traumatic stress disorder, chronic: Secondary | ICD-10-CM | POA: Diagnosis not present

## 2022-05-05 DIAGNOSIS — F840 Autistic disorder: Secondary | ICD-10-CM | POA: Diagnosis not present

## 2022-05-05 DIAGNOSIS — Z8249 Family history of ischemic heart disease and other diseases of the circulatory system: Secondary | ICD-10-CM

## 2022-05-06 DIAGNOSIS — F4312 Post-traumatic stress disorder, chronic: Secondary | ICD-10-CM | POA: Diagnosis not present

## 2022-05-07 ENCOUNTER — Telehealth (INDEPENDENT_AMBULATORY_CARE_PROVIDER_SITE_OTHER): Payer: BC Managed Care – PPO | Admitting: Adult Health

## 2022-05-07 ENCOUNTER — Encounter: Payer: Self-pay | Admitting: Adult Health

## 2022-05-07 DIAGNOSIS — F4312 Post-traumatic stress disorder, chronic: Secondary | ICD-10-CM | POA: Diagnosis not present

## 2022-05-07 DIAGNOSIS — R55 Syncope and collapse: Secondary | ICD-10-CM | POA: Diagnosis not present

## 2022-05-07 DIAGNOSIS — R002 Palpitations: Secondary | ICD-10-CM

## 2022-05-07 DIAGNOSIS — Z8241 Family history of sudden cardiac death: Secondary | ICD-10-CM | POA: Diagnosis not present

## 2022-05-07 DIAGNOSIS — F840 Autistic disorder: Secondary | ICD-10-CM | POA: Diagnosis not present

## 2022-05-07 NOTE — Progress Notes (Signed)
Virtual Visit via Video Note  I connected with [name] on 05/07/22 at 11:45 AM EDT by a video enabled telemedicine application and verified that I am speaking with the correct person using two identifiers.  Location patient: home Location provider:work or home office Persons participating in the virtual visit: patient, provider  I discussed the limitations of evaluation and management by telemedicine and the availability of in person appointments. The patient expressed understanding and agreed to proceed.   HPI: 31 year old female who has a family history of sudden cardiac death would like a referral to cardiology due to family history as well as more recent symptoms.  She reports that over the last couple weeks she has had intermittent feeling of chest tightness, palpitations, and syncopal episodes.  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  She reports that her symptoms feel different.  No further syncopal episode, her last episode was 2 days ago when she was getting off her horse, felt palpitations and had a syncopal episode for a few seconds.  Prior to that she had 2 separate syncopal episodes while running that lasted for approximately 4 seconds.  She reports sudden cardiac death of her father at the age of 64, per patient report her father was in great health and was an Careers information officer runner.  They were both out running together and her father collapsed  Her paternal grandfather also died of sudden cardiac death is his 60  Her mother has a cardiac him disorder as well as mitral valve prolapse and she has a paternal aunt who has long QT syndrome.   ROS: See pertinent positives and negatives per HPI.  Past Medical History:  Diagnosis Date   Acne    Anemia    hx of   Anxiety    Asthma    Carpal tunnel syndrome    right   Complex partial seizures ([place])    last seizure 04-15-801   Complication of anesthesia    slow to wake up, disoriented, hx of  mild seizure  after surgery -2019 gallbladder    Coordination problem    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)    Migraines    Ovarian cyst    Right   Pneumonia 12/2018   hx of    PONV (postoperative nausea and vomiting)    PTSD (post-traumatic stress disorder)    PTSD (post-traumatic stress disorder)    Shoulder dislocation 09/2018   Left   Shoulder subluxation, left    Syncope    Tremor, unspecified    bilateral arms   Wears glasses    Wears hearing aid    bilateral    Past Surgical History:  Procedure Laterality Date   ABDOMINAL HYSTERECTOMY     APPENDECTOMY     CHOLECYSTECTOMY N/A 10/05/2018   Procedure: LAPAROSCOPIC CHOLECYSTECTOMY WITH  INTRAOPERATIVE CHOLANGIOGRAM ERAS PATHWAY;  Surgeon: Alphonsa Overall, MD;  Location: [facility];  Service: General;  Laterality: N/A;   CYSTOSCOPY N/A 03/12/2021   Procedure: CYSTOSCOPY;  Surgeon: Sherlyn Hay, DO;  Location: [facility];  Service: Gynecology;  Laterality: N/A;   LAPAROSCOPIC APPENDECTOMY  11/26/2011   Procedure: APPENDECTOMY LAPAROSCOPIC;  Surgeon: Gayland Curry, MD;  Location: [facility];  Service: General;  Laterality: N/A;   left shoulder manipulation     in er multilple times last done july 2021   NIPPLE SPARING MASTECTOMY Bilateral 02/25/2022   Procedure: BILATERAL NIPPLE-SPARING MASTECTOMY;  Surgeon: Stark Klein, MD;  Location: [facility];  Service: General;  Laterality: Bilateral;   PORT-A-CATH REMOVAL N/A 12/10/2021   Procedure: REMOVAL PORT-A-CATH AND  REPLACEMENT;  Surgeon: [name], MD;  Location: [facility];  Service: General;  Laterality: N/A;   PORTACATH PLACEMENT Right 08/28/2017   Procedure: POWER PORT PLACEMENT;  Surgeon: Alphonsa Overall, MD;  Location: [facility];  Service: General;  Laterality: Right;   PORTACATH PLACEMENT N/A 12/04/2017   Procedure: INSERTION PORT-A-CATH;  Surgeon: Alphonsa Overall, MD;  Location: [facility];  Service: General;  Laterality: N/A;   RADIOLOGY WITH ANESTHESIA Left 10/25/2019   Procedure: MRI  LEFT SHOLDER WITHOUT CONTRAST;  Surgeon: Radiologist, Medication, MD;  Location: [facility];  Service: Radiology;  Laterality: Left;   RADIOLOGY WITH ANESTHESIA Bilateral 11/19/2021   Procedure: MRI WITH ANESTHESIA OF BILATERAL BREASTS WITH AND WITHOUT CONTRAST;  Surgeon: Radiologist, Medication, MD;  Location: [facility];  Service: Radiology;  Laterality: Bilateral;   REPAIR ANKLE LIGAMENT Left    x 3   TOTAL LAPAROSCOPIC HYSTERECTOMY WITH SALPINGECTOMY Bilateral 03/12/2021   Procedure: TOTAL LAPAROSCOPIC HYSTERECTOMY WITH SALPINGECTOMY;  Surgeon: Sherlyn Hay, DO;  Location: [facility];  Service: Gynecology;  Laterality: Bilateral;    Family History  Problem Relation Age of Onset   Polymyalgia rheumatica Mother    Heart disease Mother    Sudden Cardiac Death Father        long QT syndrome    Heart disease Maternal Grandmother    Breast cancer Maternal Grandmother    Heart disease Maternal Grandfather    Stroke Maternal Grandfather    Diabetes Maternal Grandfather    COPD Maternal Grandfather    Kidney failure Maternal Grandfather    Heart disease Paternal Grandmother    Heart disease Paternal Grandfather    Migraines Paternal Grandfather        Current Outpatient Medications:    acetaminophen (TYLENOL) 325 MG tablet, Take 2 tablets (650 mg total) by mouth every 6 (six) hours as needed (pain)., Disp: , Rfl:    Adapalene 0.3 % gel, Apply 1 application topically daily as needed (acne). Alternate  when run out, Disp: , Rfl:    albuterol (VENTOLIN HFA) 108 (90 Base) MCG/ACT inhaler, Inhale 2 puffs into the lungs every 6 (six) hours as needed for wheezing or shortness of breath., Disp: 6.7 g, Rfl: 3   buPROPion (WELLBUTRIN XL) 300 MG 24 hr tablet, Take 300 mg by mouth 2 (two) times daily., Disp: , Rfl:    busPIRone (BUSPAR) 15 MG tablet, Take 15 mg by mouth 2 (two) times daily., Disp: , Rfl:    clindamycin (CLEOCIN T) 1 % lotion, Apply 1 application topically as needed (Acne). Apply to affected areas of the face 2 times a day, Disp: , Rfl: 1   Coenzyme Q10 (COQ10) 100 MG CAPS, Take 100 mg by mouth daily., Disp: , Rfl:  Continuous Blood Gluc Sensor (DEXCOM G7 SENSOR) MISC, 1 Device by Does not apply route once a week. Change every 10 days, Disp: 3 each, Rfl: 11   Continuous Blood Gluc Transmit (DEXCOM G6 TRANSMITTER) MISC, Use with dexcom receiver and sensor, Disp: 1 each, Rfl: 3   Dapsone 5 % topical gel, Apply 1 application topically daily as needed (Acne)., Disp: , Rfl:    diphenhydrAMINE (BENADRYL) 25 MG tablet, Take 50 mg by mouth at bedtime., Disp: , Rfl:    Glucagon, rDNA, (GLUCAGON EMERGENCY) 1 MG KIT, Use PRN for hypoglycemia, Disp: 1 kit, Rfl: 3   glucose monitoring kit (FREESTYLE) monitoring kit, 1 each by Does not apply route as needed for other. Free style libre on right arm, Disp: , Rfl:    Lacosamide (VIMPAT) 100 MG TABS, Take 1 tablet in AM, 2 tablets in PM, Disp: 90 tablet, Rfl: 5   levETIRAcetam (KEPPRA) 500 MG tablet, Take 1 tablet twice a day, Disp: 60 tablet, Rfl: 11   LORazepam (ATIVAN) 0.5 MG tablet, Take 1 tablet as needed for seizure. Do not take more than 2-3 a week., Disp: 10 tablet, Rfl: 5   montelukast (SINGULAIR) 10 MG tablet, Take 1 tablet (10 mg total) by mouth at bedtime., Disp: 90 tablet, Rfl: 3   spironolactone (ALDACTONE) 50 MG tablet, Take 50 mg by mouth at bedtime., Disp: , Rfl:    topiramate (TOPAMAX) 25 MG tablet, Take by mouth., Disp: , Rfl:   Current  Facility-Administered Medications:    alteplase (CATHFLO ACTIVASE) injection 2 mg, 2 mg, Intracatheter, Once, Nafziger, [name], NP  EXAM:  VITALS per patient if applicable:  GENERAL: alert, oriented, appears well and in no acute distress  HEENT: atraumatic, conjunttiva clear, no obvious abnormalities on inspection of external nose and ears  NECK: normal movements of the head and neck  LUNGS: on inspection no signs of respiratory distress, breathing rate appears normal, no obvious gross SOB, gasping or wheezing  CV: no obvious cyanosis  MS: moves all visible extremities without noticeable abnormality  PSYCH/NEURO: pleasant and cooperative, no obvious depression or anxiety, speech and thought processing grossly intact  ASSESSMENT AND PLAN:  Discussed the following assessment and plan:  1. Syncope, unspecified syncope type -Referral to cardiology was placed earlier today.  2. Palpitations   3. Family history of sudden cardiac death       I discussed the assessment and treatment plan with the patient. The patient was provided an opportunity to ask questions and all were answered. The patient agreed with the plan and demonstrated an understanding of the instructions.   The patient was advised to call back or seek an in-person evaluation if the symptoms worsen or if the condition fails to improve as anticipated.   Dorothyann Peng, NP

## 2022-05-08 DIAGNOSIS — F4312 Post-traumatic stress disorder, chronic: Secondary | ICD-10-CM | POA: Diagnosis not present

## 2022-05-13 ENCOUNTER — Telehealth: Payer: BC Managed Care – PPO | Admitting: Adult Health

## 2022-05-13 DIAGNOSIS — F4312 Post-traumatic stress disorder, chronic: Secondary | ICD-10-CM | POA: Diagnosis not present

## 2022-05-14 ENCOUNTER — Ambulatory Visit (INDEPENDENT_AMBULATORY_CARE_PROVIDER_SITE_OTHER): Payer: BC Managed Care – PPO | Admitting: Internal Medicine

## 2022-05-14 ENCOUNTER — Encounter: Payer: Self-pay | Admitting: Internal Medicine

## 2022-05-14 ENCOUNTER — Telehealth: Payer: Self-pay | Admitting: Internal Medicine

## 2022-05-14 VITALS — BP 112/100 | HR 60 | Ht 65.0 in | Wt 100.0 lb

## 2022-05-14 DIAGNOSIS — R55 Syncope and collapse: Secondary | ICD-10-CM | POA: Diagnosis not present

## 2022-05-14 DIAGNOSIS — R002 Palpitations: Secondary | ICD-10-CM | POA: Diagnosis not present

## 2022-05-14 DIAGNOSIS — R9431 Abnormal electrocardiogram [ECG] [EKG]: Secondary | ICD-10-CM | POA: Diagnosis not present

## 2022-05-14 MED ORDER — NADOLOL 40 MG PO TABS: 40.0000 mg | ORAL_TABLET | Freq: Every day | ORAL | 1 refills | Status: DC

## 2022-05-14 NOTE — Telephone Encounter (Signed)
Patient callling back to see if the ekg is still needed. Please advise

## 2022-05-14 NOTE — Patient Instructions (Addendum)
Medication Instructions:  START Nadolol 40 mg once daily  *If you need a refill on your cardiac medications before your next appointment, please call your pharmacy*   Lab Work: None ordered If you have labs (blood work) drawn today and your tests are completely normal, you will receive your results only by: [facility] (if you have MyChart) OR A paper copy in the mail If you have any lab test that is abnormal or we need to change your treatment, we will call you to review the results.   Testing/Procedures: Your physician has requested that you have an echocardiogram. Echocardiography is a painless test that uses sound waves to create images of your heart. It provides your doctor with information about the size and shape of your heart and how well your heart's chambers and valves are working. You may receive an ultrasound enhancing agent through an IV if needed to better visualize your heart during the echo.This procedure takes approximately one hour. There are no restrictions for this procedure. This will take place at the [address], [address].     Follow-Up: At [facility], you and your health needs are our priority.  As part of our continuing mission to provide you with exceptional heart care, we have created designated Provider Care Teams.  These Care Teams include your primary Cardiologist (physician) and Advanced Practice Providers (APPs -  Physician Assistants and Nurse Practitioners) who all work together to provide you with the care you need, when you need it.  We recommend signing up for the patient portal called "MyChart".  Sign up information is provided on this After Visit Summary.  MyChart is used to connect with patients for Virtual Visits (Telemedicine).  Patients are able to view lab/test results, encounter notes, upcoming appointments, etc.  Non-urgent messages can be sent to your provider as well.   To learn more about what you can do with MyChart, go to  [email or web address].    Your next appointment:   3 month(s)  The format for your next appointment:   In Person  Provider:   Janina Mayo, MD   A referral has been placed to Dr. Lattie Corns for genetic testing  Other Instructions [employer] Cardiac Event Monitor Instructions Your physician has requested you wear your cardiac event monitor for ___14__ days, (1-30). [employer] may call or text to confirm a shipping address. The monitor will be sent to a land address via UPS. [employer] will not ship a monitor to a PO BOX. It typically takes 3-5 days to receive your monitor after it has been enrolled. [employer] will assist with USPS tracking if your package is delayed. The telephone number for [employer] is [phone]. Once you have received your monitor, please review the enclosed instructions. Instruction tutorials can also be viewed under help and settings on the enclosed cell phone. Your monitor has already been registered assigning a specific monitor serial # to you.  Applying the monitor Remove cell phone from case and turn it on. The cell phone works as Dealer and needs to be within [employer] of you at all times. The cell phone will need to be charged on a daily basis. We recommend you plug the cell phone into the enclosed charger at your bedside table every night.  Monitor batteries: You will receive two monitor batteries labelled #1 and #2. These are your recorders. Plug battery #2 onto the second connection on the enclosed charger. Keep one battery on the charger at all times.  This will keep the monitor battery deactivated. It will also keep it fully charged for when you need to switch your monitor batteries. A small light will be blinking on the battery emblem when it is charging. The light on the battery emblem will remain on when the battery is fully charged.  Open package of a Monitor strip. Insert battery #1 into black hood on strip and gently squeeze  monitor battery onto connection as indicated in instruction booklet. Set aside while preparing skin.  Choose location for your strip, vertical or horizontal, as indicated in the instruction booklet. Shave to remove all hair from location. There cannot be any lotions, oils, powders, or colognes on skin where monitor is to be applied. Wipe skin clean with enclosed Saline wipe. Dry skin completely.  Peel paper labeled #1 off the back of the Monitor strip exposing the adhesive. Place the monitor on the chest in the vertical or horizontal position shown in the instruction booklet. One arrow on the monitor strip must be pointing upward. Carefully remove paper labeled #2, attaching remainder of strip to your skin. Try not to create any folds or wrinkles in the strip as you apply it.  Firmly press and release the circle in the center of the monitor battery. You will hear a small beep. This is turning the monitor battery on. The heart emblem on the monitor battery will light up every 5 seconds if the monitor battery in turned on and connected to the patient securely. Do not push and hold the circle down as this turns the monitor battery off. The cell phone will locate the monitor battery. A screen will appear on the cell phone checking the connection of your monitor strip. This may read poor connection initially but change to good connection within the next minute. Once your monitor accepts the connection you will hear a series of 3 beeps followed by a climbing crescendo of beeps. A screen will appear on the cell phone showing the two monitor strip placement options. Touch the picture that demonstrates where you applied the monitor strip.  Your monitor strip and battery are waterproof. You are able to shower, bathe, or swim with the monitor on. They just ask you do not submerge deeper than 3 feet underwater. We recommend removing the monitor if you are swimming in a lake, river, or ocean.  Your  monitor battery will need to be switched to a fully charged monitor battery approximately once a week. The cell phone will alert you of an action which needs to be made.  On the cell phone, tap for details to reveal connection status, monitor battery status, and cell phone battery status. The green dots indicates your monitor is in good status. A red dot indicates there is something that needs your attention.  To record a symptom, click the circle on the monitor battery. In 30-60 seconds a list of symptoms will appear on the cell phone. Select your symptom and tap save. Your monitor will record a sustained or significant arrhythmia regardless of you clicking the button. Some patients do not feel the heart rhythm irregularities. [employer] will notify us of any serious or critical events.  Refer to instruction booklet for instructions on switching batteries, changing strips, the Do not disturb or Pause features, or any additional questions.  Call [employer] at [phone], to confirm your monitor is transmitting and record your baseline. They will answer any questions you may have regarding the monitor instructions at that time.  Returning the monitor  to [employer] Place all equipment back into blue box. Peel off strip of paper to expose adhesive and close box securely. There is a prepaid UPS shipping label on this box. Drop in a UPS drop box, or at a UPS facility like [employer]. You may also contact [employer] to arrange UPS to pick up monitor package at your home.

## 2022-05-14 NOTE — Telephone Encounter (Signed)
Called patient, advised that her ECHO was no longer needed, but was checking to see if monitor was needed. I did advise it looked as if this was ordered and would be sent out.  Patient verbalized understanding, thankful for call back.

## 2022-05-14 NOTE — Addendum Note (Signed)
Addended by: Ricci Barker on: 05/14/2022 04:32 PM   Modules accepted: Orders
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
## 2022-05-16 DIAGNOSIS — F4312 Post-traumatic stress disorder, chronic: Secondary | ICD-10-CM | POA: Diagnosis not present

## 2022-05-19 ENCOUNTER — Encounter (HOSPITAL_COMMUNITY): Payer: BC Managed Care – PPO

## 2022-05-19 ENCOUNTER — Telehealth: Payer: Self-pay | Admitting: Adult Health

## 2022-05-19 DIAGNOSIS — F4312 Post-traumatic stress disorder, chronic: Secondary | ICD-10-CM | POA: Diagnosis not present

## 2022-05-19 NOTE — Telephone Encounter (Signed)
Caryl Pina a nurse with the patient care center called because the order for her port flush is expired and they need a new one. Patient has an appointment with them on Friday   Fax number is [phone]   Good callback number is [phone]     Please advise

## 2022-05-20 ENCOUNTER — Other Ambulatory Visit: Payer: Self-pay | Admitting: Adult Health

## 2022-05-20 DIAGNOSIS — F4312 Post-traumatic stress disorder, chronic: Secondary | ICD-10-CM | POA: Diagnosis not present

## 2022-05-21 ENCOUNTER — Other Ambulatory Visit: Payer: Self-pay | Admitting: Adult Health

## 2022-05-21 ENCOUNTER — Telehealth (HOSPITAL_COMMUNITY): Payer: Self-pay

## 2022-05-21 DIAGNOSIS — Z95828 Presence of other vascular implants and grafts: Secondary | ICD-10-CM

## 2022-05-21 DIAGNOSIS — F4312 Post-traumatic stress disorder, chronic: Secondary | ICD-10-CM | POA: Diagnosis not present

## 2022-05-21 DIAGNOSIS — F840 Autistic disorder: Secondary | ICD-10-CM | POA: Diagnosis not present

## 2022-05-21 MED ORDER — ALTEPLASE 2 MG IJ SOLR: 2.0000 mg | Freq: Once | INTRAMUSCULAR | Status: AC | PRN

## 2022-05-21 NOTE — Telephone Encounter (Signed)
Received a call from Netta Corrigan PA at [facility] to clarify order for pt. Informed provider that an order is needed to access, flush ,and heparinize PAC monthly. No need for TPA at this time, due to pt having new port placed, will contact if further orders needed. Verbal order given for RN in [facility] to place order for port access and send for cosign, standing order x 1 year. Repeated back to provider.

## 2022-05-21 NOTE — Telephone Encounter (Signed)
Noted! Caryl Pina advised of update but will send over the correct order she is needing.

## 2022-05-22 ENCOUNTER — Ambulatory Visit (INDEPENDENT_AMBULATORY_CARE_PROVIDER_SITE_OTHER): Payer: BC Managed Care – PPO | Admitting: Adult Health

## 2022-05-22 VITALS — BP 96/78 | HR 64 | Temp 98.2°F

## 2022-05-22 DIAGNOSIS — R599 Enlarged lymph nodes, unspecified: Secondary | ICD-10-CM | POA: Diagnosis not present

## 2022-05-22 DIAGNOSIS — R5383 Other fatigue: Secondary | ICD-10-CM

## 2022-05-22 DIAGNOSIS — R634 Abnormal weight loss: Secondary | ICD-10-CM | POA: Diagnosis not present

## 2022-05-22 DIAGNOSIS — Z713 Dietary counseling and surveillance: Secondary | ICD-10-CM | POA: Diagnosis not present

## 2022-05-22 DIAGNOSIS — R55 Syncope and collapse: Secondary | ICD-10-CM

## 2022-05-22 DIAGNOSIS — F4312 Post-traumatic stress disorder, chronic: Secondary | ICD-10-CM | POA: Diagnosis not present

## 2022-05-22 LAB — CBC WITH DIFFERENTIAL/PLATELET
Basophils Absolute: 0 10*3/uL (ref 0.0–0.1)
Basophils Relative: 0.7 % (ref 0.0–3.0)
Eosinophils Absolute: 0.2 10*3/uL (ref 0.0–0.7)
Eosinophils Relative: 3.7 % (ref 0.0–5.0)
HCT: 42.1 % (ref 36.0–46.0)
Hemoglobin: 14.1 g/dL (ref 12.0–15.0)
Lymphocytes Relative: 29.5 % (ref 12.0–46.0)
Lymphs Abs: 1.9 10*3/uL (ref 0.7–4.0)
MCHC: 33.4 g/dL (ref 30.0–36.0)
MCV: 88.1 fl (ref 78.0–100.0)
Monocytes Absolute: 0.7 10*3/uL (ref 0.1–1.0)
Monocytes Relative: 10.8 % (ref 3.0–12.0)
Neutro Abs: 3.6 10*3/uL (ref 1.4–7.7)
Neutrophils Relative %: 55.3 % (ref 43.0–77.0)
Platelets: 312 10*3/uL (ref 150.0–400.0)
RBC: 4.77 Mil/uL (ref 3.87–5.11)
RDW: 12.8 % (ref 11.5–15.5)
WBC: 6.6 10*3/uL (ref 4.0–10.5)

## 2022-05-22 LAB — COMPREHENSIVE METABOLIC PANEL
ALT: 15 U/L (ref 0–35)
AST: 20 U/L (ref 0–37)
Albumin: 4.8 g/dL (ref 3.5–5.2)
Alkaline Phosphatase: 58 U/L (ref 39–117)
BUN: 13 mg/dL (ref 6–23)
CO2: 26 mEq/L (ref 19–32)
Calcium: 9.8 mg/dL (ref 8.4–10.5)
Chloride: 102 mEq/L (ref 96–112)
Creatinine, Ser: 0.83 mg/dL (ref 0.40–1.20)
GFR: 93.55 mL/min (ref >60.00)
Glucose, Bld: 74 mg/dL (ref 70–99)
Potassium: 3.6 mEq/L (ref 3.5–5.1)
Sodium: 139 mEq/L (ref 135–145)
Total Bilirubin: 0.4 mg/dL (ref 0.2–1.2)
Total Protein: 7.3 g/dL (ref 6.0–8.3)

## 2022-05-22 LAB — VITAMIN D 25 HYDROXY (VIT D DEFICIENCY, FRACTURES): VITD: 35 ng/mL (ref 30.00–100.00)

## 2022-05-22 LAB — TSH: TSH: 1.05 u[IU]/mL (ref 0.35–5.50)

## 2022-05-22 LAB — C-REACTIVE PROTEIN: CRP: <1 mg/dL (ref 0.5–20.0)

## 2022-05-22 LAB — SEDIMENTATION RATE: Sed Rate: 1 mm/hr (ref 0–20)

## 2022-05-22 LAB — VITAMIN B12: Vitamin B-12: 222 pg/mL (ref 211–911)
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
## 2022-05-23 ENCOUNTER — Encounter (HOSPITAL_COMMUNITY): Payer: BC Managed Care – PPO

## 2022-05-23 DIAGNOSIS — F4312 Post-traumatic stress disorder, chronic: Secondary | ICD-10-CM | POA: Diagnosis not present

## 2022-05-26 DIAGNOSIS — F4312 Post-traumatic stress disorder, chronic: Secondary | ICD-10-CM | POA: Diagnosis not present

## 2022-05-26 DIAGNOSIS — F410 Panic disorder [episodic paroxysmal anxiety] without agoraphobia: Secondary | ICD-10-CM | POA: Diagnosis not present

## 2022-05-27 ENCOUNTER — Ambulatory Visit (INDEPENDENT_AMBULATORY_CARE_PROVIDER_SITE_OTHER): Payer: BC Managed Care – PPO

## 2022-05-27 DIAGNOSIS — F4312 Post-traumatic stress disorder, chronic: Secondary | ICD-10-CM | POA: Diagnosis not present

## 2022-05-27 DIAGNOSIS — R002 Palpitations: Secondary | ICD-10-CM | POA: Diagnosis not present

## 2022-05-27 DIAGNOSIS — R55 Syncope and collapse: Secondary | ICD-10-CM

## 2022-05-28 DIAGNOSIS — F4312 Post-traumatic stress disorder, chronic: Secondary | ICD-10-CM | POA: Diagnosis not present

## 2022-05-28 DIAGNOSIS — F840 Autistic disorder: Secondary | ICD-10-CM | POA: Diagnosis not present

## 2022-06-02 DIAGNOSIS — F4312 Post-traumatic stress disorder, chronic: Secondary | ICD-10-CM | POA: Diagnosis not present

## 2022-06-03 ENCOUNTER — Other Ambulatory Visit (HOSPITAL_COMMUNITY): Payer: BC Managed Care – PPO

## 2022-06-03 DIAGNOSIS — F4312 Post-traumatic stress disorder, chronic: Secondary | ICD-10-CM | POA: Diagnosis not present

## 2022-06-05 DIAGNOSIS — F4312 Post-traumatic stress disorder, chronic: Secondary | ICD-10-CM | POA: Diagnosis not present

## 2022-06-05 DIAGNOSIS — Z713 Dietary counseling and surveillance: Secondary | ICD-10-CM | POA: Diagnosis not present

## 2022-06-07 DIAGNOSIS — F4312 Post-traumatic stress disorder, chronic: Secondary | ICD-10-CM | POA: Diagnosis not present

## 2022-06-09 DIAGNOSIS — F4312 Post-traumatic stress disorder, chronic: Secondary | ICD-10-CM | POA: Diagnosis not present

## 2022-06-10 DIAGNOSIS — F4312 Post-traumatic stress disorder, chronic: Secondary | ICD-10-CM | POA: Diagnosis not present

## 2022-06-11 DIAGNOSIS — F840 Autistic disorder: Secondary | ICD-10-CM | POA: Diagnosis not present

## 2022-06-11 DIAGNOSIS — F4312 Post-traumatic stress disorder, chronic: Secondary | ICD-10-CM | POA: Diagnosis not present

## 2022-06-12 ENCOUNTER — Telehealth: Payer: Self-pay | Admitting: *Deleted

## 2022-06-12 DIAGNOSIS — R55 Syncope and collapse: Secondary | ICD-10-CM

## 2022-06-12 DIAGNOSIS — F840 Autistic disorder: Secondary | ICD-10-CM | POA: Diagnosis not present

## 2022-06-12 DIAGNOSIS — R002 Palpitations: Secondary | ICD-10-CM

## 2022-06-12 DIAGNOSIS — F4312 Post-traumatic stress disorder, chronic: Secondary | ICD-10-CM | POA: Diagnosis not present

## 2022-06-12 NOTE — Telephone Encounter (Signed)
Spoke with pt, aware to stop nadolol. Referral placed

## 2022-06-12 NOTE — Telephone Encounter (Signed)
-----   Message from Janina Mayo, MD sent at 06/12/2022  3:39 PM EDT ----- [name], please let Dhani know that she had some slow heart rates and fast heart rates. Please have her stop the nadolol. Please send referral to EP for follow-up

## 2022-06-19 DIAGNOSIS — Z713 Dietary counseling and surveillance: Secondary | ICD-10-CM | POA: Diagnosis not present

## 2022-06-20 DIAGNOSIS — F840 Autistic disorder: Secondary | ICD-10-CM | POA: Diagnosis not present

## 2022-06-23 DIAGNOSIS — F840 Autistic disorder: Secondary | ICD-10-CM | POA: Diagnosis not present

## 2022-06-24 ENCOUNTER — Encounter: Payer: Self-pay | Admitting: Internal Medicine

## 2022-06-24 ENCOUNTER — Ambulatory Visit: Payer: BC Managed Care – PPO | Admitting: Internal Medicine

## 2022-06-24 VITALS — BP 100/74 | HR 55 | Ht 65.0 in | Wt 102.0 lb

## 2022-06-24 DIAGNOSIS — R55 Syncope and collapse: Secondary | ICD-10-CM | POA: Diagnosis not present

## 2022-06-24 DIAGNOSIS — R9431 Abnormal electrocardiogram [ECG] [EKG]: Secondary | ICD-10-CM

## 2022-06-24 DIAGNOSIS — F840 Autistic disorder: Secondary | ICD-10-CM | POA: Diagnosis not present
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
## 2022-06-24 NOTE — Patient Instructions (Addendum)
Medication Instructions:  Your physician recommends that you continue on your current medications as directed. Please refer to the Current Medication list given to you today.  Labwork: None ordered.  Testing/Procedures: None ordered.  Follow-Up: Your physician wants you to follow-up in: 3 months with Cristopher Peru, MD   Any Other Special Instructions Will Be Listed Below (If Applicable).  You are being referred to Dr. Broadus John for genetic testing.  If you need a refill on your cardiac medications before your next appointment, please call your pharmacy.   Important Information About Sugar

## 2022-06-26 DIAGNOSIS — F840 Autistic disorder: Secondary | ICD-10-CM | POA: Diagnosis not present

## 2022-06-27 DIAGNOSIS — Z713 Dietary counseling and surveillance: Secondary | ICD-10-CM | POA: Diagnosis not present

## 2022-06-27 DIAGNOSIS — F840 Autistic disorder: Secondary | ICD-10-CM | POA: Diagnosis not present

## 2022-06-28 DIAGNOSIS — F840 Autistic disorder: Secondary | ICD-10-CM | POA: Diagnosis not present

## 2022-06-30 ENCOUNTER — Non-Acute Institutional Stay (HOSPITAL_COMMUNITY)
Admission: RE | Admit: 2022-06-30 | Discharge: 2022-06-30 | Disposition: A | Discharge status: Home / Self Care | Payer: BC Managed Care – PPO | Source: Ambulatory Visit | Attending: Internal Medicine | Admitting: Internal Medicine

## 2022-06-30 DIAGNOSIS — Z452 Encounter for adjustment and management of vascular access device: Secondary | ICD-10-CM | POA: Diagnosis not present

## 2022-06-30 DIAGNOSIS — Z95828 Presence of other vascular implants and grafts: Secondary | ICD-10-CM | POA: Diagnosis not present

## 2022-06-30 DIAGNOSIS — F840 Autistic disorder: Secondary | ICD-10-CM | POA: Diagnosis not present

## 2022-06-30 DIAGNOSIS — E8842 MERRF syndrome: Secondary | ICD-10-CM | POA: Diagnosis not present

## 2022-06-30 DIAGNOSIS — R112 Nausea with vomiting, unspecified: Secondary | ICD-10-CM | POA: Diagnosis not present

## 2022-06-30 DIAGNOSIS — Z1501 Genetic susceptibility to malignant neoplasm of breast: Secondary | ICD-10-CM | POA: Diagnosis not present

## 2022-06-30 MED ORDER — SODIUM CHLORIDE 0.9% FLUSH
10.0000 mL | INTRAVENOUS | Status: AC | PRN
  Administered 2022-06-30: 10 mL

## 2022-06-30 MED ORDER — HEPARIN SOD (PORK) LOCK FLUSH 100 UNIT/ML IV SOLN
500.0000 [IU] | INTRAVENOUS | Status: AC | PRN
  Administered 2022-06-30: 500 [IU]

## 2022-06-30 NOTE — Progress Notes (Signed)
PATIENT CARE CENTER NOTE     Provider: Dr. Dorothyann Peng     Procedure: Port-a-cath flush     Note: Patient's PAC accessed and flushed with 0.9% Sodium Chloride and Heparin. Sterile procedure followed. Patient tolerated procedure well. Verbal order given on 05/21/22 by Dr. Dorothyann Peng for port flush x 1 year. AVS offered but patient refused. Patient alert, oriented and ambulatory at discharge.

## 2022-07-01 ENCOUNTER — Telehealth (INDEPENDENT_AMBULATORY_CARE_PROVIDER_SITE_OTHER): Payer: BC Managed Care – PPO | Admitting: Adult Health

## 2022-07-01 ENCOUNTER — Encounter: Payer: Self-pay | Admitting: Adult Health

## 2022-07-01 VITALS — HR 61 | Ht 65.0 in | Wt 96.0 lb

## 2022-07-01 DIAGNOSIS — R634 Abnormal weight loss: Secondary | ICD-10-CM | POA: Diagnosis not present

## 2022-07-01 DIAGNOSIS — F840 Autistic disorder: Secondary | ICD-10-CM | POA: Diagnosis not present

## 2022-07-01 DIAGNOSIS — K2100 Gastro-esophageal reflux disease with esophagitis, without bleeding: Secondary | ICD-10-CM | POA: Diagnosis not present

## 2022-07-01 MED ORDER — SUCRALFATE 1 G PO TABS: 1.0000 g | ORAL_TABLET | Freq: Three times a day (TID) | ORAL | 0 refills | Status: DC

## 2022-07-01 MED ORDER — PANTOPRAZOLE SODIUM 40 MG PO TBEC: 40.0000 mg | DELAYED_RELEASE_TABLET | Freq: Every day | ORAL | 0 refills | Status: DC

## 2022-07-01 NOTE — Progress Notes (Signed)
Subjective:    Patient ID: [name], female    DOB: [birth year], 31 y.o.   MRN: [id number]  HPI Virtual Visit via Video Note  I connected with [name] on 07/01/22 at  7:00 AM EDT by a video enabled telemedicine application and verified that I am speaking with the correct person using two identifiers.  Location patient: home Location provider:work or home office Persons participating in the virtual visit: patient, provider  I discussed the limitations of evaluation and management by telemedicine and the availability of in person appointments. The patient expressed understanding and agreed to proceed.   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  She is being evaluated today for continued unintentional weight loss. She reports that she is 10-12 pounds from her baseline weight. 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 Feels as though the vomiting has become more frequent though. She also reports GERD like symptoms that have been worse. Symptoms include pain in the epigastric area, diarrhea, and burning sensation in her epigastric area when she lays down.   Nothing in her diet has  changed  and she eats a heart healthy diet.   Labs that were done last month for this issue were unremarkable.    ROS: See pertinent positives and negatives per HPI.  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)    Migraines    Ovarian cyst    Right   Pneumonia 12/2018   hx of    PONV (postoperative nausea and vomiting)    PTSD (post-traumatic stress disorder)    PTSD (post-traumatic stress disorder)    Shoulder dislocation 09/2018   Left   Shoulder subluxation, left    Syncope    Tremor, unspecified    bilateral arms   Wears  glasses    Wears hearing aid    bilateral    Past Surgical History:  Procedure Laterality Date   ABDOMINAL HYSTERECTOMY     APPENDECTOMY     CHOLECYSTECTOMY N/A 10/05/2018   Procedure: LAPAROSCOPIC CHOLECYSTECTOMY WITH INTRAOPERATIVE CHOLANGIOGRAM ERAS PATHWAY;  Surgeon: Alphonsa Overall, MD;  Location[facility];  Service: General;  Laterality: N/A;   CYSTOSCOPY N/A 03/12/2021   Procedure: CYSTOSCOPY;  Surgeon: Sherlyn Hay, DO;  Location: [facility];  Service: Gynecology;  Laterality: N/A;   LAPAROSCOPIC APPENDECTOMY  11/26/2011   Procedure: APPENDECTOMY  LAPAROSCOPIC;  Surgeon: Gayland Curry, MD;  Location: [facility];  Service: General;  Laterality: N/A;   left shoulder manipulation     in er multilple times last done july 2021   NIPPLE SPARING MASTECTOMY Bilateral 02/25/2022   Procedure: BILATERAL NIPPLE-SPARING MASTECTOMY;  Surgeon: Stark Klein, MD;  Location: [facility];  Service: General;  Laterality: Bilateral;   PORT-A-CATH REMOVAL N/A 12/10/2021   Procedure: REMOVAL PORT-A-CATH AND REPLACEMENT;  Surgeon: [name], MD;  Location: [facility];  Service: General;  Laterality: N/A;   PORTACATH PLACEMENT Right 08/28/2017   Procedure: POWER PORT PLACEMENT;  Surgeon: Alphonsa Overall, MD;  Location: [facility];  Service: General;  Laterality: Right;   PORTACATH PLACEMENT N/A 12/04/2017   Procedure: INSERTION PORT-A-CATH;  Surgeon: Alphonsa Overall, MD;  Location: [facility];  Service: General;  Laterality: N/A;   RADIOLOGY WITH ANESTHESIA Left 10/25/2019   Procedure: MRI  LEFT SHOLDER WITHOUT CONTRAST;  Surgeon: Radiologist, Medication, MD;  Location: [facility];  Service: Radiology;  Laterality: Left;   RADIOLOGY WITH ANESTHESIA Bilateral 11/19/2021   Procedure: MRI WITH ANESTHESIA OF BILATERAL BREASTS WITH AND WITHOUT CONTRAST;  Surgeon: Radiologist, Medication, MD;  Location: [place];  Service: Radiology;  Laterality: Bilateral;   REPAIR ANKLE LIGAMENT Left    x 3   TOTAL LAPAROSCOPIC HYSTERECTOMY WITH SALPINGECTOMY Bilateral 03/12/2021   Procedure: TOTAL LAPAROSCOPIC HYSTERECTOMY WITH SALPINGECTOMY;  Surgeon: Sherlyn Hay, DO;  Location: [facility];  Service: Gynecology;  Laterality: Bilateral;    Family History  Problem Relation Age of Onset   Polymyalgia rheumatica Mother    Heart disease Mother    Sudden Cardiac Death Father        long QT syndrome    Heart disease Maternal Grandmother    Breast cancer Maternal Grandmother    Heart disease Maternal Grandfather    Stroke Maternal Grandfather    Diabetes Maternal Grandfather    COPD  Maternal Grandfather    Kidney failure Maternal Grandfather    Heart disease Paternal Grandmother    Heart disease Paternal Grandfather    Migraines Paternal Grandfather        Current Outpatient Medications:    acetaminophen (TYLENOL) 325 MG tablet, Take 2 tablets (650 mg total) by mouth every 6 (six) hours as needed (pain)., Disp: , Rfl:    Adapalene 0.3 % gel, Apply 1 application topically daily as needed (acne). Alternate when run out, Disp: , Rfl:    albuterol (VENTOLIN HFA) 108 (90 Base) MCG/ACT inhaler, Inhale 2 puffs into the lungs every 6 (six) hours as needed for wheezing or shortness of breath., Disp: 6.7 g, Rfl: 3   buPROPion (WELLBUTRIN XL) 300 MG 24 hr tablet, Take 300 mg by mouth 2 (two) times daily., Disp: , Rfl:    busPIRone (BUSPAR) 15 MG tablet, Take 15 mg by mouth 2 (two) times daily., Disp: , Rfl:    clindamycin (CLEOCIN T)  1 % lotion, Apply 1 application topically as needed (Acne). Apply to affected areas of the face 2 times a day, Disp: , Rfl: 1   Coenzyme Q10 (COQ10) 100 MG CAPS, Take 100 mg by mouth daily., Disp: , Rfl:    Continuous Blood Gluc Sensor (DEXCOM G7 SENSOR) MISC, 1 Device by Does not apply route once a week. Change every 10 days, Disp: 3 each, Rfl: 11   Continuous Blood Gluc Transmit (DEXCOM G6 TRANSMITTER) MISC, Use with dexcom receiver and sensor, Disp: 1 each, Rfl: 3   Dapsone 5 % topical gel, Apply 1 application topically daily as needed (Acne)., Disp: , Rfl:    diphenhydrAMINE (BENADRYL) 25 MG tablet, Take 50 mg by mouth at bedtime., Disp: , Rfl:    Glucagon, rDNA, (GLUCAGON EMERGENCY) 1 MG KIT, Use PRN for hypoglycemia, Disp: 1 kit, Rfl: 3   glucose monitoring kit (FREESTYLE) monitoring kit, 1 each by Does not apply route as needed for other. Free style libre on right arm, Disp: , Rfl:    Lacosamide (VIMPAT) 100 MG TABS, Take 1 tablet in AM, 2 tablets in PM, Disp: 90 tablet, Rfl: 5   levETIRAcetam (KEPPRA) 500 MG tablet, Take 1 tablet twice a day,  Disp: 60 tablet, Rfl: 11   LORazepam (ATIVAN) 0.5 MG tablet, Take 1 tablet as needed for seizure. Do not take more than 2-3 a week., Disp: 10 tablet, Rfl: 5   montelukast (SINGULAIR) 10 MG tablet, Take 1 tablet (10 mg total) by mouth at bedtime., Disp: 90 tablet, Rfl: 3   spironolactone (ALDACTONE) 50 MG tablet, Take 50 mg by mouth at bedtime., Disp: , Rfl:    topiramate (TOPAMAX) 25 MG tablet, Take by mouth., Disp: , Rfl:   Current Facility-Administered Medications:    alteplase (CATHFLO ACTIVASE) injection 2 mg, 2 mg, Intracatheter, Once, Negin Hegg, [name], NP   alteplase (CATHFLO ACTIVASE) injection 2 mg, 2 mg, Intracatheter, Once PRN, Amiria Orrison, [name], NP  EXAM:  VITALS per patient if applicable:  GENERAL: alert, oriented, appears well and in no acute distress  HEENT: atraumatic, conjunttiva clear, no obvious abnormalities on inspection of external nose and ears  NECK: normal movements of the head and neck  LUNGS: on inspection no signs of respiratory distress, breathing rate appears normal, no obvious gross SOB, gasping or wheezing  CV: no obvious cyanosis  MS: moves all visible extremities without noticeable abnormality  PSYCH/NEURO: pleasant and cooperative, no obvious depression or anxiety, speech and thought processing grossly intact  ASSESSMENT AND PLAN:  Discussed the following assessment and plan:  1. Unintentional weight loss - possibly due to GERD.  - Will trial her on Protonix and Carafate  - Follow up in one month - Consider referral to GI   2. Gastroesophageal reflux disease with esophagitis without hemorrhage  - pantoprazole (PROTONIX) 40 MG tablet; Take 1 tablet (40 mg total) by mouth daily.  Dispense: 30 tablet; Refill: 0 - sucralfate (CARAFATE) 1 g tablet; Take 1 tablet (1 g total) by mouth 4 (four) times daily -  with meals and at bedtime for 14 days.  Dispense: 56 tablet; Refill: 0      I discussed the assessment and treatment plan with the patient. The  patient was provided an opportunity to ask questions and all were answered. The patient agreed with the plan and demonstrated an understanding of the instructions.   The patient was advised to call back or seek an in-person evaluation if the symptoms worsen or if the condition fails  to improve as anticipated.   Dorothyann Peng, NP

## 2022-07-03 DIAGNOSIS — F840 Autistic disorder: Secondary | ICD-10-CM | POA: Diagnosis not present

## 2022-07-04 ENCOUNTER — Telehealth: Payer: Self-pay

## 2022-07-04 DIAGNOSIS — F840 Autistic disorder: Secondary | ICD-10-CM | POA: Diagnosis not present

## 2022-07-04 NOTE — Telephone Encounter (Signed)
Patient has been scheduled for follow up on 08/12/22 at 10:30 am with Dr. Tarri Glenn. Pt is aware of appt.

## 2022-07-04 NOTE — Telephone Encounter (Signed)
Thornton Park, MD  Carl Best, RN  Please schedule new patient appointment with me. This patient will need 40 minutes - so please block the appointment time after her. Thank you.   KLB

## 2022-07-07 DIAGNOSIS — F840 Autistic disorder: Secondary | ICD-10-CM | POA: Diagnosis not present

## 2022-07-07 DIAGNOSIS — M79604 Pain in right leg: Secondary | ICD-10-CM | POA: Diagnosis not present

## 2022-07-08 ENCOUNTER — Ambulatory Visit (INDEPENDENT_AMBULATORY_CARE_PROVIDER_SITE_OTHER): Payer: BC Managed Care – PPO | Admitting: Cardiology

## 2022-07-08 ENCOUNTER — Encounter: Payer: Self-pay | Admitting: Cardiology

## 2022-07-08 VITALS — BP 130/70 | HR 83 | Ht 65.0 in | Wt 100.0 lb

## 2022-07-08 DIAGNOSIS — F840 Autistic disorder: Secondary | ICD-10-CM | POA: Diagnosis not present

## 2022-07-08 DIAGNOSIS — R55 Syncope and collapse: Secondary | ICD-10-CM | POA: Diagnosis not present
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
## 2022-07-08 NOTE — Patient Instructions (Signed)
Medication Instructions:  Your physician recommends that you continue on your current medications as directed. Please refer to the Current Medication list given to you today.  *If you need a refill on your cardiac medications before your next appointment, please call your pharmacy*  Follow-Up: At [facility], you and your health needs are our priority.  As part of our continuing mission to provide you with exceptional heart care, we have created designated Provider Care Teams.  These Care Teams include your primary Cardiologist (physician) and Advanced Practice Providers (APPs -  Physician Assistants and Nurse Practitioners) who all work together to provide you with the care you need, when you need it.  Your next appointment:   6 month(s)  The format for your next appointment:   In Person  Provider:   Candee Furbish, MD  Important Information About Sugar

## 2022-07-12 DIAGNOSIS — F840 Autistic disorder: Secondary | ICD-10-CM | POA: Diagnosis not present

## 2022-07-14 DIAGNOSIS — F840 Autistic disorder: Secondary | ICD-10-CM | POA: Diagnosis not present

## 2022-07-15 ENCOUNTER — Institutional Professional Consult (permissible substitution): Payer: BC Managed Care – PPO | Admitting: Internal Medicine

## 2022-07-15 DIAGNOSIS — F840 Autistic disorder: Secondary | ICD-10-CM | POA: Diagnosis not present

## 2022-07-16 ENCOUNTER — Encounter: Payer: Self-pay | Admitting: Cardiology

## 2022-07-16 DIAGNOSIS — F840 Autistic disorder: Secondary | ICD-10-CM | POA: Diagnosis not present

## 2022-07-17 DIAGNOSIS — F840 Autistic disorder: Secondary | ICD-10-CM | POA: Diagnosis not present

## 2022-07-18 DIAGNOSIS — F840 Autistic disorder: Secondary | ICD-10-CM | POA: Diagnosis not present

## 2022-07-24 ENCOUNTER — Other Ambulatory Visit: Payer: Self-pay

## 2022-07-24 ENCOUNTER — Telehealth (INDEPENDENT_AMBULATORY_CARE_PROVIDER_SITE_OTHER): Payer: BC Managed Care – PPO | Admitting: Adult Health

## 2022-07-24 ENCOUNTER — Encounter: Payer: Self-pay | Admitting: Adult Health

## 2022-07-24 VITALS — Ht 65.0 in

## 2022-07-24 DIAGNOSIS — R55 Syncope and collapse: Secondary | ICD-10-CM
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
## 2022-07-29 DIAGNOSIS — E162 Hypoglycemia, unspecified: Secondary | ICD-10-CM | POA: Diagnosis not present

## 2022-07-29 DIAGNOSIS — F509 Eating disorder, unspecified: Secondary | ICD-10-CM | POA: Diagnosis not present

## 2022-07-29 DIAGNOSIS — R1115 Cyclical vomiting syndrome unrelated to migraine: Secondary | ICD-10-CM | POA: Diagnosis not present

## 2022-07-29 DIAGNOSIS — E882 Lipomatosis, not elsewhere classified: Secondary | ICD-10-CM | POA: Diagnosis not present

## 2022-07-29 DIAGNOSIS — E8842 MERRF syndrome: Secondary | ICD-10-CM | POA: Diagnosis not present

## 2022-08-05 DIAGNOSIS — F509 Eating disorder, unspecified: Secondary | ICD-10-CM | POA: Diagnosis not present

## 2022-08-05 DIAGNOSIS — I951 Orthostatic hypotension: Secondary | ICD-10-CM | POA: Diagnosis not present

## 2022-08-05 DIAGNOSIS — R001 Bradycardia, unspecified: Secondary | ICD-10-CM | POA: Diagnosis not present

## 2022-08-05 DIAGNOSIS — E162 Hypoglycemia, unspecified: Secondary | ICD-10-CM | POA: Diagnosis not present

## 2022-08-08 DIAGNOSIS — Z713 Dietary counseling and surveillance: Secondary | ICD-10-CM | POA: Diagnosis not present

## 2022-08-11 ENCOUNTER — Non-Acute Institutional Stay (HOSPITAL_COMMUNITY)
Admission: RE | Admit: 2022-08-11 | Discharge: 2022-08-11 | Disposition: A | Discharge status: Home / Self Care | Payer: BC Managed Care – PPO | Source: Ambulatory Visit | Attending: Internal Medicine | Admitting: Internal Medicine

## 2022-08-11 DIAGNOSIS — Z452 Encounter for adjustment and management of vascular access device: Secondary | ICD-10-CM | POA: Diagnosis not present

## 2022-08-11 DIAGNOSIS — Z713 Dietary counseling and surveillance: Secondary | ICD-10-CM | POA: Diagnosis not present

## 2022-08-11 MED ORDER — SODIUM CHLORIDE 0.9% FLUSH
10.0000 mL | INTRAVENOUS | Status: AC | PRN
  Administered 2022-08-11: 10 mL

## 2022-08-11 MED ORDER — HEPARIN SOD (PORK) LOCK FLUSH 100 UNIT/ML IV SOLN
500.0000 [IU] | INTRAVENOUS | Status: AC | PRN
Start: 2022-08-11 — End: 2022-08-11
  Administered 2022-08-11: 500 [IU]

## 2022-08-11 NOTE — Progress Notes (Signed)
PATIENT CARE CENTER NOTE     Provider: Dr. Dorothyann Peng     Procedure: Port-a-cath flush     Note: Patient's PAC accessed and flushed with 0.9% Sodium Chloride and Heparin. Sterile procedure followed. Patient tolerated procedure well. Verbal order given on 05/21/22 by Dr. Dorothyann Peng for port flush x 1 year. AVS offered but patient refused. Patient alert, oriented and ambulatory at discharge.

## 2022-08-12 ENCOUNTER — Ambulatory Visit: Payer: BC Managed Care – PPO | Admitting: Gastroenterology

## 2022-08-13 ENCOUNTER — Encounter (HOSPITAL_COMMUNITY): Payer: BC Managed Care – PPO

## 2022-08-14 ENCOUNTER — Encounter: Payer: Self-pay | Admitting: Internal Medicine

## 2022-08-14 ENCOUNTER — Ambulatory Visit: Payer: BC Managed Care – PPO | Attending: Internal Medicine | Admitting: Internal Medicine

## 2022-08-14 VITALS — BP 94/70 | HR 75 | Ht 65.0 in | Wt 97.0 lb

## 2022-08-14 DIAGNOSIS — E785 Hyperlipidemia, unspecified: Secondary | ICD-10-CM | POA: Diagnosis not present

## 2022-08-14 NOTE — Patient Instructions (Signed)
Medication Instructions:  The current medical regimen is effective;  continue present plan and medications.  *If you need a refill on your cardiac medications before your next appointment, please call your pharmacy*   Lab Work: LIPID, LPa today   If you have labs (blood work) drawn today and your tests are completely normal, you will receive your results only by: [place] (if you have MyChart) OR A paper copy in the mail If you have any lab test that is abnormal or we need to change your treatment, we will call you to review the results.   Follow-Up: At [facility], you and your health needs are our priority.  As part of our continuing mission to provide you with exceptional heart care, we have created designated Provider Care Teams.  These Care Teams include your primary Cardiologist (physician) and Advanced Practice Providers (APPs -  Physician Assistants and Nurse Practitioners) who all work together to provide you with the care you need, when you need it.  We recommend signing up for the patient portal called "MyChart".  Sign up information is provided on this After Visit Summary.  MyChart is used to connect with patients for Virtual Visits (Telemedicine).  Patients are able to view lab/test results, encounter notes, upcoming appointments, etc.  Non-urgent messages can be sent to your provider as well.   To learn more about what you can do with MyChart, go to [email or web address].    Your next appointment:   12 month(s)  The format for your next appointment:   In Person  Provider:   Janina Mayo, MD
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
## 2022-08-15 DIAGNOSIS — F4312 Post-traumatic stress disorder, chronic: Secondary | ICD-10-CM | POA: Diagnosis not present

## 2022-08-15 DIAGNOSIS — F840 Autistic disorder: Secondary | ICD-10-CM | POA: Diagnosis not present

## 2022-08-15 LAB — LIPID PANEL
Chol/HDL Ratio: 3.2 ratio (ref 0.0–4.4)
Cholesterol, Total: 284 mg/dL — ABNORMAL HIGH (ref 100–199)
HDL: 88 mg/dL (ref >39)
LDL Chol Calc (NIH): 184 mg/dL — ABNORMAL HIGH (ref 0–99)
Triglycerides: 77 mg/dL (ref 0–149)
VLDL Cholesterol Cal: 12 mg/dL (ref 5–40)

## 2022-08-15 LAB — LIPOPROTEIN A (LPA): Lipoprotein (a): 57.4 nmol/L (ref <75.0)

## 2022-08-18 ENCOUNTER — Other Ambulatory Visit: Payer: Self-pay | Admitting: Adult Health

## 2022-08-19 DIAGNOSIS — F840 Autistic disorder: Secondary | ICD-10-CM | POA: Diagnosis not present

## 2022-08-19 DIAGNOSIS — F4312 Post-traumatic stress disorder, chronic: Secondary | ICD-10-CM | POA: Diagnosis not present

## 2022-08-20 ENCOUNTER — Telehealth (INDEPENDENT_AMBULATORY_CARE_PROVIDER_SITE_OTHER): Payer: BC Managed Care – PPO | Admitting: Adult Health

## 2022-08-20 DIAGNOSIS — E7800 Pure hypercholesterolemia, unspecified: Secondary | ICD-10-CM

## 2022-08-20 DIAGNOSIS — F4312 Post-traumatic stress disorder, chronic: Secondary | ICD-10-CM | POA: Diagnosis not present

## 2022-08-20 DIAGNOSIS — F840 Autistic disorder: Secondary | ICD-10-CM | POA: Diagnosis not present

## 2022-08-20 DIAGNOSIS — F410 Panic disorder [episodic paroxysmal anxiety] without agoraphobia: Secondary | ICD-10-CM | POA: Diagnosis not present

## 2022-08-20 NOTE — Progress Notes (Signed)
Virtual Visit via Video Note  I connected with [name] on 08/20/22 at  8:00 AM EDT by a video enabled telemedicine application and verified that I am speaking with the correct person using two identifiers.  Location patient: home Location provider:work or home office Persons participating in the virtual visit: patient, provider  I discussed the limitations of evaluation and management by telemedicine and the availability of in person appointments. The patient expressed understanding and agreed to proceed.   HPI: She is being evaluated today for follow-up regarding hyperlipidemia.  He had her cholesterol panel checked recently by cardiology showed a total cholesterol of 284, triglycerides 77, HDL 88, and LDL 184.  April protein a was normal at 57.4.  Cardiology asked her to follow-up with her PCP to see if any of her medications may be causing her cholesterol panel to be elevated.   ROS: See pertinent positives and negatives per HPI.  Past Medical History:  Diagnosis Date   Acne    Anemia    hx of   Anxiety    Asthma    Carpal tunnel syndrome    right   Complex partial seizures ([place])    last seizure 12-20-1094   Complication of anesthesia    slow to wake up, disoriented, hx of  mild seizure after surgery -2019 gallbladder    Coordination problem    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)    Migraines    Ovarian cyst    Right   Pneumonia 12/2018   hx of    PONV (postoperative nausea and  vomiting)    PTSD (post-traumatic stress disorder)    PTSD (post-traumatic stress disorder)    Shoulder dislocation 09/2018   Left   Shoulder subluxation, left    Syncope    Tremor, unspecified    bilateral arms   Wears glasses    Wears hearing aid    bilateral    Past Surgical History:  Procedure Laterality Date   ABDOMINAL HYSTERECTOMY     APPENDECTOMY     CHOLECYSTECTOMY N/A 10/05/2018   Procedure: LAPAROSCOPIC CHOLECYSTECTOMY WITH INTRAOPERATIVE CHOLANGIOGRAM ERAS PATHWAY;  Surgeon: Alphonsa Overall, MD;  [facility];  Service: General;  Laterality: N/A;   CYSTOSCOPY N/A 03/12/2021   Procedure: CYSTOSCOPY;  Surgeon: Sherlyn Hay, DO;  Location: [place];  Service: Gynecology;  Laterality: N/A;   LAPAROSCOPIC APPENDECTOMY  11/26/2011   Procedure: APPENDECTOMY LAPAROSCOPIC;  Surgeon: Gayland Curry, MD;  Location: [place];  Service: General;  Laterality: N/A;   left shoulder manipulation     in er multilple times last done july 2021   NIPPLE SPARING MASTECTOMY Bilateral 02/25/2022   Procedure: BILATERAL NIPPLE-SPARING MASTECTOMY;  Surgeon: Stark Klein, MD;  Location: [place];  Service: General;  Laterality: Bilateral;   PORT-A-CATH REMOVAL N/A 12/10/2021   Procedure: REMOVAL  PORT-A-CATH AND REPLACEMENT;  Surgeon: [name], MD;  Location: [place];  Service: General;  Laterality: N/A;   PORTACATH PLACEMENT Right 08/28/2017   Procedure: POWER PORT PLACEMENT;  Surgeon: Alphonsa Overall, MD;  Location: [place];  Service: General;  Laterality: Right;   PORTACATH PLACEMENT N/A 12/04/2017   Procedure: INSERTION PORT-A-CATH;  Surgeon: Alphonsa Overall, MD;  Location: [place];  Service: General;  Laterality: N/A;   RADIOLOGY WITH ANESTHESIA Left 10/25/2019   Procedure: MRI  LEFT SHOLDER WITHOUT CONTRAST;  Surgeon: Radiologist, Medication, MD;  Location: [place];  Service: Radiology;  Laterality: Left;   RADIOLOGY WITH ANESTHESIA Bilateral 11/19/2021   Procedure: MRI WITH  ANESTHESIA OF BILATERAL BREASTS WITH AND WITHOUT CONTRAST;  Surgeon: Radiologist, Medication, MD;  Location: [place];  Service: Radiology;  Laterality: Bilateral;   REPAIR ANKLE LIGAMENT Left    x 3   TOTAL LAPAROSCOPIC HYSTERECTOMY WITH SALPINGECTOMY Bilateral 03/12/2021   Procedure: TOTAL LAPAROSCOPIC HYSTERECTOMY WITH SALPINGECTOMY;  Surgeon: Sherlyn Hay, DO;  Location: [place];  Service: Gynecology;  Laterality: Bilateral;    Family History  Problem Relation Age of Onset   Polymyalgia rheumatica Mother    Heart disease Mother    Sudden Cardiac Death Father        long QT syndrome    Heart disease Maternal Grandmother    Breast cancer Maternal Grandmother    Heart disease Maternal Grandfather    Stroke Maternal Grandfather    Diabetes Maternal Grandfather    COPD Maternal Grandfather    Kidney failure Maternal Grandfather    Heart disease Paternal Grandmother    Heart disease Paternal Grandfather    Migraines Paternal Grandfather        Current Outpatient Medications:    acetaminophen (TYLENOL) 325 MG tablet, Take 2 tablets (650 mg total) by mouth every 6 (six) hours as needed (pain)., Disp: , Rfl:    Adapalene 0.3 % gel, Apply 1 application  topically daily as needed (acne). Alternate when run out, Disp: , Rfl:    albuterol (VENTOLIN HFA) 108 (90 Base) MCG/ACT inhaler, Inhale 2 puffs into the lungs every 6 (six) hours as needed for wheezing or shortness of breath., Disp: 6.7 g, Rfl: 3   buPROPion (WELLBUTRIN XL) 300 MG 24 hr tablet, Take 300 mg by mouth 2 (two) times daily., Disp: , Rfl:    busPIRone (BUSPAR) 15 MG tablet, Take 15 mg by mouth 2 (two) times daily., Disp: , Rfl:    clindamycin (CLEOCIN T) 1 % lotion, Apply 1 application topically as needed (Acne). Apply to affected areas of the face 2 times a day, Disp: , Rfl: 1   Coenzyme Q10 (COQ10) 100 MG CAPS, Take 100 mg by mouth daily., Disp: , Rfl:    Continuous Blood Gluc Transmit (DEXCOM G6 TRANSMITTER) MISC, Use  with dexcom receiver and sensor, Disp: 1 each, Rfl: 3   diphenhydrAMINE (BENADRYL) 25 MG tablet, Take 50 mg by mouth at bedtime., Disp: , Rfl:    Glucagon, rDNA, (GLUCAGON EMERGENCY) 1 MG KIT, Use PRN for hypoglycemia, Disp: 1 kit, Rfl: 3   glucose monitoring kit (FREESTYLE) monitoring kit, 1 each by Does not apply route as needed for other. Free style libre on right arm, Disp: , Rfl:    Lacosamide (VIMPAT) 100 MG TABS, Take 1 tablet in AM, 2 tablets in PM, Disp: 90 tablet, Rfl: 5   levETIRAcetam (KEPPRA) 500 MG tablet, Take 1 tablet twice a day, Disp: 60 tablet, Rfl: 11  LORazepam (ATIVAN) 0.5 MG tablet, Take 1 tablet as needed for seizure. Do not take more than 2-3 a week., Disp: 10 tablet, Rfl: 5   montelukast (SINGULAIR) 10 MG tablet, Take 1 tablet (10 mg total) by mouth at bedtime., Disp: 90 tablet, Rfl: 3   topiramate (TOPAMAX) 25 MG tablet, Take by mouth., Disp: , Rfl:    Continuous Blood Gluc Sensor (FREESTYLE LIBRE 2 SENSOR) MISC, USE WITH LIBRE APP, Disp: 6 each, Rfl: 2   Dapsone 5 % topical gel, Apply 1 application topically daily as needed (Acne). (Patient not taking: Reported on 08/20/2022), Disp: , Rfl:    pantoprazole (PROTONIX) 40 MG tablet, Take 1 tablet (40 mg total) by mouth daily. (Patient not taking: Reported on 08/20/2022), Disp: 30 tablet, Rfl: 0   spironolactone (ALDACTONE) 50 MG tablet, Take 50 mg by mouth at bedtime. (Patient not taking: Reported on 08/20/2022), Disp: , Rfl:    sucralfate (CARAFATE) 1 g tablet, Take 1 tablet (1 g total) by mouth 4 (four) times daily -  with meals and at bedtime for 14 days., Disp: 56 tablet, Rfl: 0  Current Facility-Administered Medications:    alteplase (CATHFLO ACTIVASE) injection 2 mg, 2 mg, Intracatheter, Once, Sathvik Tiedt, [name], NP   alteplase (CATHFLO ACTIVASE) injection 2 mg, 2 mg, Intracatheter, Once PRN, Altan Kraai, [name], NP  EXAM:  VITALS per patient if applicable:  GENERAL: alert, oriented, appears well and in no acute  distress  HEENT: atraumatic, conjunttiva clear, no obvious abnormalities on inspection of external nose and ears  NECK: normal movements of the head and neck  LUNGS: on inspection no signs of respiratory distress, breathing rate appears normal, no obvious gross SOB, gasping or wheezing  CV: no obvious cyanosis  MS: moves all visible extremities without noticeable abnormality  PSYCH/NEURO: pleasant and cooperative, no obvious depression or anxiety, speech and thought processing grossly intact  ASSESSMENT AND PLAN:  Discussed the following assessment and plan:  1. Pure hypercholesterolemia -The only medication that I see on her list that could possibly cause hyperlipidemia would be Vimpat that she takes for seizure disorder.  Fully lipoprotein a is normal and her HDL high.  She is not a candidate for statin therapy at this time     I discussed the assessment and treatment plan with the patient. The patient was provided an opportunity to ask questions and all were answered. The patient agreed with the plan and demonstrated an understanding of the instructions.   The patient was advised to call back or seek an in-person evaluation if the symptoms worsen or if the condition fails to improve as anticipated.   Dorothyann Peng, NP

## 2022-08-21 DIAGNOSIS — F4312 Post-traumatic stress disorder, chronic: Secondary | ICD-10-CM | POA: Diagnosis not present

## 2022-08-21 DIAGNOSIS — F840 Autistic disorder: Secondary | ICD-10-CM | POA: Diagnosis not present

## 2022-08-22 DIAGNOSIS — F4312 Post-traumatic stress disorder, chronic: Secondary | ICD-10-CM | POA: Diagnosis not present

## 2022-08-22 DIAGNOSIS — F840 Autistic disorder: Secondary | ICD-10-CM | POA: Diagnosis not present

## 2022-08-25 DIAGNOSIS — F4312 Post-traumatic stress disorder, chronic: Secondary | ICD-10-CM | POA: Diagnosis not present

## 2022-08-25 DIAGNOSIS — F840 Autistic disorder: Secondary | ICD-10-CM | POA: Diagnosis not present

## 2022-08-26 DIAGNOSIS — F840 Autistic disorder: Secondary | ICD-10-CM | POA: Diagnosis not present

## 2022-08-26 DIAGNOSIS — F4312 Post-traumatic stress disorder, chronic: Secondary | ICD-10-CM | POA: Diagnosis not present

## 2022-08-28 DIAGNOSIS — F840 Autistic disorder: Secondary | ICD-10-CM | POA: Diagnosis not present

## 2022-08-28 DIAGNOSIS — F4312 Post-traumatic stress disorder, chronic: Secondary | ICD-10-CM | POA: Diagnosis not present

## 2022-08-29 DIAGNOSIS — F840 Autistic disorder: Secondary | ICD-10-CM | POA: Diagnosis not present

## 2022-08-29 DIAGNOSIS — F4312 Post-traumatic stress disorder, chronic: Secondary | ICD-10-CM | POA: Diagnosis not present

## 2022-09-01 DIAGNOSIS — F4312 Post-traumatic stress disorder, chronic: Secondary | ICD-10-CM | POA: Diagnosis not present

## 2022-09-01 DIAGNOSIS — F840 Autistic disorder: Secondary | ICD-10-CM | POA: Diagnosis not present

## 2022-09-02 DIAGNOSIS — F840 Autistic disorder: Secondary | ICD-10-CM | POA: Diagnosis not present

## 2022-09-02 DIAGNOSIS — F4312 Post-traumatic stress disorder, chronic: Secondary | ICD-10-CM | POA: Diagnosis not present

## 2022-09-03 DIAGNOSIS — F840 Autistic disorder: Secondary | ICD-10-CM | POA: Diagnosis not present

## 2022-09-03 DIAGNOSIS — F4312 Post-traumatic stress disorder, chronic: Secondary | ICD-10-CM | POA: Diagnosis not present

## 2022-09-03 DIAGNOSIS — Z713 Dietary counseling and surveillance: Secondary | ICD-10-CM | POA: Diagnosis not present

## 2022-09-04 DIAGNOSIS — F4312 Post-traumatic stress disorder, chronic: Secondary | ICD-10-CM | POA: Diagnosis not present

## 2022-09-04 DIAGNOSIS — F840 Autistic disorder: Secondary | ICD-10-CM | POA: Diagnosis not present

## 2022-09-05 DIAGNOSIS — F840 Autistic disorder: Secondary | ICD-10-CM | POA: Diagnosis not present

## 2022-09-05 DIAGNOSIS — F4312 Post-traumatic stress disorder, chronic: Secondary | ICD-10-CM | POA: Diagnosis not present
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
## 2022-09-08 ENCOUNTER — Ambulatory Visit (INDEPENDENT_AMBULATORY_CARE_PROVIDER_SITE_OTHER): Payer: BC Managed Care – PPO | Admitting: Gastroenterology

## 2022-09-08 ENCOUNTER — Encounter: Payer: Self-pay | Admitting: Gastroenterology

## 2022-09-08 ENCOUNTER — Other Ambulatory Visit: Payer: Self-pay | Admitting: Gastroenterology

## 2022-09-08 VITALS — BP 106/76 | HR 67 | Ht 65.0 in

## 2022-09-08 DIAGNOSIS — F840 Autistic disorder: Secondary | ICD-10-CM | POA: Diagnosis not present

## 2022-09-08 DIAGNOSIS — K5909 Other constipation: Secondary | ICD-10-CM

## 2022-09-08 DIAGNOSIS — R112 Nausea with vomiting, unspecified: Secondary | ICD-10-CM

## 2022-09-08 DIAGNOSIS — R634 Abnormal weight loss: Secondary | ICD-10-CM | POA: Diagnosis not present

## 2022-09-08 MED ORDER — MOTEGRITY 2 MG PO TABS: 2.0000 mg | ORAL_TABLET | Freq: Every day | ORAL | 5 refills | Status: DC

## 2022-09-08 NOTE — Telephone Encounter (Signed)
Please submit PA.

## 2022-09-08 NOTE — Patient Instructions (Addendum)
It was my pleasure to provide care to you today. Based on our discussion, I am providing you with my recommendations below:  RECOMMENDATION(S):   Please keep a food diary to try to identify foods that trigger your symptoms.  We will work to get your Motegrity approved - this would hopefully help with your constipation and with your nausea and vomiting.  Will plan an upper endoscopy at the hospital.  Please update if there is any change in your symptoms before your next appointment.   FOLLOW UP:  After your procedure, you will receive a call from my office staff regarding my recommendation for follow up.  BMI:  If you are age 81 or older, your body mass index should be between 23-30. Your Body mass index is 16.14 kg/m. If this is out of the aforementioned range listed, please consider follow up with your Primary Care Provider.  If you are age 39 or younger, your body mass index should be between 19-25. Your Body mass index is 16.14 kg/m. If this is out of the aformentioned range listed, please consider follow up with your Primary Care Provider.   MY CHART:  The [facility] providers would like to encourage you to use [facility] to communicate with providers for non-urgent requests or questions.  Due to long hold times on the telephone, sending your provider a message by [facility] may be a faster and more efficient way to get a response.  Please allow 48 business hours for a response.  Please remember that this is for non-urgent requests.   Thank you for trusting me with your gastrointestinal care!    Thornton Park, MD, MPH

## 2022-09-09 ENCOUNTER — Telehealth: Payer: Self-pay

## 2022-09-09 ENCOUNTER — Telehealth: Payer: Self-pay | Admitting: Adult Health

## 2022-09-09 ENCOUNTER — Telehealth (INDEPENDENT_AMBULATORY_CARE_PROVIDER_SITE_OTHER): Payer: BC Managed Care – PPO | Admitting: Adult Health

## 2022-09-09 ENCOUNTER — Other Ambulatory Visit: Payer: Self-pay | Admitting: Adult Health

## 2022-09-09 ENCOUNTER — Other Ambulatory Visit (HOSPITAL_COMMUNITY): Payer: Self-pay

## 2022-09-09 VITALS — Ht 64.5 in | Wt 97.0 lb

## 2022-09-09 DIAGNOSIS — D849 Immunodeficiency, unspecified: Secondary | ICD-10-CM

## 2022-09-09 DIAGNOSIS — F4312 Post-traumatic stress disorder, chronic: Secondary | ICD-10-CM | POA: Diagnosis not present

## 2022-09-09 DIAGNOSIS — F840 Autistic disorder: Secondary | ICD-10-CM | POA: Diagnosis not present

## 2022-09-09 NOTE — Telephone Encounter (Signed)
Pt had virtual visit with [name] and she need the letter to be hand sign and she will come pick up letter once it has been completed

## 2022-09-09 NOTE — Telephone Encounter (Signed)
PA submitted, will update when determination received. Key: [id number]

## 2022-09-09 NOTE — Telephone Encounter (Signed)
Patient Advocate Encounter   Received notification from [name] that prior authorization is required for Motegrity '2MG'$  tablets  Submitted: 09-09-2022 Key [place]  Status is pending

## 2022-09-09 NOTE — Progress Notes (Signed)
Virtual Visit via Video Note  I connected with [name] on 09/09/22 at 11:45 AM EDT by a video enabled telemedicine application and verified that I am speaking with the correct person using two identifiers.  Location patient: home Location provider:work or home office Persons participating in the virtual visit: patient, provider  I discussed the limitations of evaluation and management by telemedicine and the availability of in person appointments. The patient expressed understanding and agreed to proceed.   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  Due to various medical issues and decreased immune system it would not be good for her to travel back and forth between the [facility] during COVID/flu/RSV season.  ROS: See pertinent positives and negatives per HPI.  Past Medical History:  Diagnosis Date   Acne    Anemia    hx of   Anxiety    Asthma    Carpal tunnel syndrome    right   Complex partial seizures ([place])    last seizure 03-16-7061   Complication of anesthesia    slow to wake up, disoriented, hx of  mild seizure after surgery -2019 gallbladder    Coordination problem    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)    Migraines    Ovarian cyst    Right   Pneumonia 12/2018   hx of    PONV (postoperative nausea and vomiting)    PTSD (post-traumatic stress  disorder)    PTSD (post-traumatic stress disorder)    Shoulder dislocation 09/2018   Left   Shoulder subluxation, left    Syncope    Tremor, unspecified    bilateral arms   Wears glasses    Wears hearing aid    bilateral    Past Surgical History:  Procedure Laterality Date   ABDOMINAL HYSTERECTOMY     APPENDECTOMY     CHOLECYSTECTOMY N/A 10/05/2018   Procedure: LAPAROSCOPIC CHOLECYSTECTOMY WITH INTRAOPERATIVE CHOLANGIOGRAM ERAS PATHWAY;  Surgeon: Alphonsa Overall, MD;  [facility];  Service: General;  Laterality: N/A;   CYSTOSCOPY N/A 03/12/2021   Procedure: CYSTOSCOPY;  Surgeon: Sherlyn Hay, DO;  Location: [place];  Service: Gynecology;  Laterality: N/A;   LAPAROSCOPIC APPENDECTOMY  11/26/2011   Procedure: APPENDECTOMY LAPAROSCOPIC;  Surgeon: Gayland Curry, MD;  Location: [place];  Service: General;  Laterality: N/A;   left shoulder manipulation     in er multilple times last done july 2021   NIPPLE SPARING MASTECTOMY Bilateral 02/25/2022   Procedure: BILATERAL NIPPLE-SPARING MASTECTOMY;  Surgeon: Stark Klein, MD;  Location: [place];  Service: General;  Laterality: Bilateral;   PORT-A-CATH REMOVAL N/A 12/10/2021   Procedure: REMOVAL PORT-A-CATH AND REPLACEMENT;  Surgeon: Zenia Resides,  Blanchard Mane, MD;  Location: [facility];  Service: General;  Laterality: N/A;   PORTACATH PLACEMENT Right 08/28/2017   Procedure: POWER PORT PLACEMENT;  Surgeon: Alphonsa Overall, MD;  Location: [place];  Service: General;  Laterality: Right;   PORTACATH PLACEMENT N/A 12/04/2017   Procedure: INSERTION PORT-A-CATH;  Surgeon: Alphonsa Overall, MD;  Location: [place];  Service: General;  Laterality: N/A;   RADIOLOGY WITH ANESTHESIA Left 10/25/2019   Procedure: MRI  LEFT SHOLDER WITHOUT CONTRAST;  Surgeon: Radiologist, Medication, MD;  Location: [place];  Service: Radiology;  Laterality: Left;   RADIOLOGY WITH ANESTHESIA Bilateral 11/19/2021   Procedure: MRI WITH ANESTHESIA OF BILATERAL BREASTS WITH AND  WITHOUT CONTRAST;  Surgeon: Radiologist, Medication, MD;  Location: [place];  Service: Radiology;  Laterality: Bilateral;   REPAIR ANKLE LIGAMENT Left    x 3   TOTAL LAPAROSCOPIC HYSTERECTOMY WITH SALPINGECTOMY Bilateral 03/12/2021   Procedure: TOTAL LAPAROSCOPIC HYSTERECTOMY WITH SALPINGECTOMY;  Surgeon: Sherlyn Hay, DO;  Location: [place];  Service: Gynecology;  Laterality: Bilateral;    Family History  Problem Relation Age of Onset   Polymyalgia rheumatica Mother    Heart disease Mother    Sudden Cardiac Death Father        long QT syndrome    Heart disease Maternal Grandmother    Breast cancer Maternal Grandmother    Stomach cancer Maternal Grandmother    Heart disease Maternal Grandfather    Stroke Maternal Grandfather    Diabetes Maternal Grandfather    COPD Maternal Grandfather    Kidney failure Maternal Grandfather    Colon cancer Paternal Grandmother 35   Heart disease Paternal Grandmother    Heart disease Paternal Grandfather    Migraines Paternal Grandfather    Esophageal cancer Cousin    Pancreatic cancer Neg Hx        Current Outpatient Medications:    acetaminophen (TYLENOL) 325 MG tablet, Take 2 tablets (650 mg total) by mouth every 6 (six) hours as needed (pain)., Disp: , Rfl:    Adapalene 0.3 % gel, Apply 1 application  topically daily as needed (acne). Alternate when run out, Disp: , Rfl:    albuterol (VENTOLIN HFA) 108 (90 Base) MCG/ACT inhaler, Inhale 2 puffs into the lungs every 6 (six) hours as needed for wheezing or shortness of breath., Disp: 6.7 g, Rfl: 3   buPROPion (WELLBUTRIN XL) 300 MG 24 hr tablet, Take 300 mg by mouth 2 (two) times daily., Disp: , Rfl:    busPIRone (BUSPAR) 15 MG tablet, Take 15 mg by mouth 2 (two) times daily., Disp: , Rfl:    clindamycin (CLEOCIN T) 1 % lotion, Apply 1 application topically as needed (Acne). Apply to affected areas of the face 2 times a day, Disp: , Rfl: 1   Coenzyme Q10 (COQ10) 100 MG CAPS, Take 100 mg by  mouth daily., Disp: , Rfl:    Continuous Blood Gluc Sensor (FREESTYLE LIBRE 2 SENSOR) MISC, USE WITH LIBRE APP, Disp: 6 each, Rfl: 2   Dapsone 5 % topical gel, Apply 1 application  topically daily as needed (Acne)., Disp: , Rfl:    diphenhydrAMINE (BENADRYL) 25 MG tablet, Take 50 mg by mouth at bedtime., Disp: , Rfl:    Glucagon, rDNA, (GLUCAGON EMERGENCY) 1 MG KIT, Use PRN for hypoglycemia, Disp: 1 kit, Rfl: 3   glucose monitoring kit (FREESTYLE) monitoring kit, 1 each by Does not apply route as needed for other. Free style libre on right arm, Disp: , Rfl:  Lacosamide (VIMPAT) 100 MG TABS, Take 1 tablet in AM, 2 tablets in PM, Disp: 90 tablet, Rfl: 5   levETIRAcetam (KEPPRA) 500 MG tablet, Take 1 tablet twice a day, Disp: 60 tablet, Rfl: 11   LORazepam (ATIVAN) 0.5 MG tablet, Take 1 tablet as needed for seizure. Do not take more than 2-3 a week., Disp: 10 tablet, Rfl: 5   montelukast (SINGULAIR) 10 MG tablet, TAKE 1 TABLET BY MOUTH EVERYDAY AT BEDTIME, Disp: 90 tablet, Rfl: 3   Prucalopride Succinate (MOTEGRITY) 2 MG TABS, Take 1 tablet (2 mg total) by mouth daily., Disp: 30 tablet, Rfl: 5   topiramate (TOPAMAX) 25 MG tablet, Take by mouth., Disp: , Rfl:    Continuous Blood Gluc Transmit (DEXCOM G6 TRANSMITTER) MISC, Use with dexcom receiver and sensor (Patient not taking: Reported on 09/09/2022), Disp: 1 each, Rfl: 3  Current Facility-Administered Medications:    alteplase (CATHFLO ACTIVASE) injection 2 mg, 2 mg, Intracatheter, Once, Oziel Beitler, NP   alteplase (CATHFLO ACTIVASE) injection 2 mg, 2 mg, Intracatheter, Once PRN, Juelle Dickmann, [name], NP  EXAM:  VITALS per patient if applicable:  GENERAL: alert, oriented, appears well and in no acute distress  HEENT: atraumatic, conjunttiva clear, no obvious abnormalities on inspection of external nose and ears  NECK: normal movements of the head and neck  LUNGS: on inspection no signs of respiratory distress, breathing rate appears normal,  no obvious gross SOB, gasping or wheezing  CV: no obvious cyanosis  MS: moves all visible extremities without noticeable abnormality  PSYCH/NEURO: pleasant and cooperative, no obvious depression or anxiety, speech and thought processing grossly intact  ASSESSMENT AND PLAN:  Discussed the following assessment and plan:  1. Immune deficiency disorder ([place]) - Note provided       I discussed the assessment and treatment plan with the patient. The patient was provided an opportunity to ask questions and all were answered. The patient agreed with the plan and demonstrated an understanding of the instructions.   The patient was advised to call back or seek an in-person evaluation if the symptoms worsen or if the condition fails to improve as anticipated.   Dorothyann Peng, NP

## 2022-09-10 DIAGNOSIS — F840 Autistic disorder: Secondary | ICD-10-CM | POA: Diagnosis not present

## 2022-09-10 DIAGNOSIS — Z713 Dietary counseling and surveillance: Secondary | ICD-10-CM | POA: Diagnosis not present

## 2022-09-10 DIAGNOSIS — F4312 Post-traumatic stress disorder, chronic: Secondary | ICD-10-CM | POA: Diagnosis not present

## 2022-09-10 NOTE — Telephone Encounter (Signed)
Pt is aware letter is ready for pick up

## 2022-09-10 NOTE — Telephone Encounter (Signed)
Lmom for pt to return my call.  

## 2022-09-11 DIAGNOSIS — F4312 Post-traumatic stress disorder, chronic: Secondary | ICD-10-CM | POA: Diagnosis not present

## 2022-09-11 DIAGNOSIS — F840 Autistic disorder: Secondary | ICD-10-CM | POA: Diagnosis not present

## 2022-09-11 NOTE — Telephone Encounter (Signed)
Received a fax regarding Prior Authorization from [facility] for [place] '2MG'$  tablets.  PA has been DENIED.  Denial is being sent to patient.   Key: [id number]

## 2022-09-15 DIAGNOSIS — F840 Autistic disorder: Secondary | ICD-10-CM | POA: Diagnosis not present

## 2022-09-15 DIAGNOSIS — F4312 Post-traumatic stress disorder, chronic: Secondary | ICD-10-CM | POA: Diagnosis not present

## 2022-09-15 NOTE — Telephone Encounter (Signed)
Looking at the denial letter, it looks like patient must try/fail or have contraindications to all alternatives. This includes trulance and amitiza. Please advise if still wish to appeal or change medication

## 2022-09-16 ENCOUNTER — Encounter: Payer: Self-pay | Admitting: Gastroenterology

## 2022-09-16 DIAGNOSIS — F840 Autistic disorder: Secondary | ICD-10-CM | POA: Diagnosis not present

## 2022-09-16 DIAGNOSIS — F4312 Post-traumatic stress disorder, chronic: Secondary | ICD-10-CM | POA: Diagnosis not present

## 2022-09-17 ENCOUNTER — Other Ambulatory Visit: Payer: Self-pay

## 2022-09-17 MED ORDER — TRULANCE 3 MG PO TABS: 3.0000 mg | ORAL_TABLET | Freq: Every day | ORAL | 0 refills | Status: DC

## 2022-09-18 ENCOUNTER — Telehealth: Payer: Self-pay

## 2022-09-18 ENCOUNTER — Other Ambulatory Visit (HOSPITAL_COMMUNITY): Payer: Self-pay

## 2022-09-18 DIAGNOSIS — F840 Autistic disorder: Secondary | ICD-10-CM | POA: Diagnosis not present

## 2022-09-18 DIAGNOSIS — F4312 Post-traumatic stress disorder, chronic: Secondary | ICD-10-CM | POA: Diagnosis not present

## 2022-09-18 DIAGNOSIS — Z713 Dietary counseling and surveillance: Secondary | ICD-10-CM | POA: Diagnosis not present

## 2022-09-18 NOTE — Telephone Encounter (Signed)
Received notification from [facility] regarding a prior authorization for Trulance '3MG'$  tablets . Authorization has been APPROVED from 09/18/22 to 09/17/23.   Key: [id number]

## 2022-09-19 NOTE — Telephone Encounter (Signed)
PA has been approved

## 2022-09-22 DIAGNOSIS — F4312 Post-traumatic stress disorder, chronic: Secondary | ICD-10-CM | POA: Diagnosis not present

## 2022-09-22 DIAGNOSIS — F840 Autistic disorder: Secondary | ICD-10-CM | POA: Diagnosis not present

## 2022-09-23 ENCOUNTER — Encounter: Payer: BC Managed Care – PPO | Admitting: Genetic Counselor

## 2022-09-23 DIAGNOSIS — F840 Autistic disorder: Secondary | ICD-10-CM | POA: Diagnosis not present

## 2022-09-25 DIAGNOSIS — F840 Autistic disorder: Secondary | ICD-10-CM | POA: Diagnosis not present

## 2022-09-25 DIAGNOSIS — F4312 Post-traumatic stress disorder, chronic: Secondary | ICD-10-CM | POA: Diagnosis not present

## 2022-09-29 ENCOUNTER — Ambulatory Visit: Payer: BC Managed Care – PPO | Admitting: Internal Medicine

## 2022-09-30 DIAGNOSIS — F840 Autistic disorder: Secondary | ICD-10-CM | POA: Diagnosis not present

## 2022-09-30 DIAGNOSIS — F4312 Post-traumatic stress disorder, chronic: Secondary | ICD-10-CM | POA: Diagnosis not present

## 2022-10-01 DIAGNOSIS — F840 Autistic disorder: Secondary | ICD-10-CM | POA: Diagnosis not present

## 2022-10-02 DIAGNOSIS — Z713 Dietary counseling and surveillance: Secondary | ICD-10-CM | POA: Diagnosis not present

## 2022-10-02 DIAGNOSIS — F840 Autistic disorder: Secondary | ICD-10-CM | POA: Diagnosis not present

## 2022-10-02 DIAGNOSIS — F4312 Post-traumatic stress disorder, chronic: Secondary | ICD-10-CM | POA: Diagnosis not present

## 2022-10-03 ENCOUNTER — Non-Acute Institutional Stay (HOSPITAL_COMMUNITY)
Admission: RE | Admit: 2022-10-03 | Discharge: 2022-10-03 | Disposition: A | Discharge status: Home / Self Care | Payer: BC Managed Care – PPO | Source: Ambulatory Visit | Attending: Internal Medicine | Admitting: Internal Medicine

## 2022-10-03 ENCOUNTER — Ambulatory Visit: Payer: BC Managed Care – PPO | Admitting: Internal Medicine

## 2022-10-03 DIAGNOSIS — F840 Autistic disorder: Secondary | ICD-10-CM | POA: Diagnosis not present

## 2022-10-03 DIAGNOSIS — F4312 Post-traumatic stress disorder, chronic: Secondary | ICD-10-CM | POA: Diagnosis not present

## 2022-10-03 DIAGNOSIS — D849 Immunodeficiency, unspecified: Secondary | ICD-10-CM | POA: Insufficient documentation

## 2022-10-03 MED ORDER — SODIUM CHLORIDE 0.9% FLUSH
10.0000 mL | INTRAVENOUS | Status: AC | PRN
  Administered 2022-10-03: 10 mL

## 2022-10-03 MED ORDER — HEPARIN SOD (PORK) LOCK FLUSH 100 UNIT/ML IV SOLN
500.0000 [IU] | INTRAVENOUS | Status: AC | PRN
  Administered 2022-10-03: 500 [IU]

## 2022-10-03 NOTE — Progress Notes (Signed)
PATIENT CARE CENTER NOTE     Provider: Dr. Dorothyann Peng     Procedure: Port-a-cath flush     Note: Patient's PAC accessed and flushed with 0.9% Sodium Chloride and Heparin. Sterile procedure followed. Patient tolerated procedure well. Verbal order given on 05/21/22 by Dr. Dorothyann Peng for port flush x 1 year. AVS offered but patient refused. Patient alert, oriented and ambulatory at discharge. Discharged home with support person.

## 2022-10-06 DIAGNOSIS — F4312 Post-traumatic stress disorder, chronic: Secondary | ICD-10-CM | POA: Diagnosis not present

## 2022-10-06 DIAGNOSIS — F840 Autistic disorder: Secondary | ICD-10-CM | POA: Diagnosis not present

## 2022-10-07 DIAGNOSIS — F4312 Post-traumatic stress disorder, chronic: Secondary | ICD-10-CM | POA: Diagnosis not present

## 2022-10-07 DIAGNOSIS — F840 Autistic disorder: Secondary | ICD-10-CM | POA: Diagnosis not present

## 2022-10-08 DIAGNOSIS — F840 Autistic disorder: Secondary | ICD-10-CM | POA: Diagnosis not present

## 2022-10-08 DIAGNOSIS — F4312 Post-traumatic stress disorder, chronic: Secondary | ICD-10-CM | POA: Diagnosis not present

## 2022-10-09 DIAGNOSIS — F4312 Post-traumatic stress disorder, chronic: Secondary | ICD-10-CM | POA: Diagnosis not present

## 2022-10-09 DIAGNOSIS — F840 Autistic disorder: Secondary | ICD-10-CM | POA: Diagnosis not present

## 2022-10-10 ENCOUNTER — Encounter (HOSPITAL_COMMUNITY): Payer: Self-pay | Admitting: Gastroenterology

## 2022-10-10 ENCOUNTER — Other Ambulatory Visit: Payer: Self-pay

## 2022-10-10 ENCOUNTER — Encounter (HOSPITAL_COMMUNITY): Payer: BC Managed Care – PPO

## 2022-10-13 ENCOUNTER — Encounter: Payer: Self-pay | Admitting: Internal Medicine

## 2022-10-13 ENCOUNTER — Ambulatory Visit: Payer: BC Managed Care – PPO | Attending: Internal Medicine | Admitting: Internal Medicine

## 2022-10-13 VITALS — BP 94/80 | HR 68 | Ht 64.0 in | Wt 97.0 lb

## 2022-10-13 DIAGNOSIS — F840 Autistic disorder: Secondary | ICD-10-CM | POA: Diagnosis not present

## 2022-10-13 DIAGNOSIS — R001 Bradycardia, unspecified: Secondary | ICD-10-CM | POA: Insufficient documentation

## 2022-10-13 DIAGNOSIS — R9431 Abnormal electrocardiogram [ECG] [EKG]: Secondary | ICD-10-CM

## 2022-10-13 DIAGNOSIS — F4312 Post-traumatic stress disorder, chronic: Secondary | ICD-10-CM | POA: Diagnosis not present
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
## 2022-10-13 NOTE — Progress Notes (Signed)
eek  

## 2022-10-13 NOTE — Patient Instructions (Addendum)
Medication Instructions:  Your physician recommends that you continue on your current medications as directed. Please refer to the Current Medication list given to you today.  *If you need a refill on your cardiac medications before your next appointment, please call your pharmacy*  Lab Work: None ordered.  If you have labs (blood work) drawn today and your tests are completely normal, you will receive your results only by: [place] (if you have MyChart) OR A paper copy in the mail If you have any lab test that is abnormal or we need to change your treatment, we will call you to review the results.  Testing/Procedures: None ordered.  Follow-Up: WE WILL CONTACT YOU TO SCHEDULE A LOOP RECORDER IMPLANT PROCEDURE.  RECURRENT UNEXPLAINED SYNCOPE.  Implantable Loop Recorder Placement  An implantable loop recorder is a small electronic device that is placed under the skin of the chest. The device records the electrical activity of the heart over a long period of time. Your health care provider can download these recordings to monitor your heart. You may need an implantable loop recorder if you have periods of abnormal heart activity (arrhythmias) or unexplained fainting (syncope). The recorder can be left in place for as long as 3 years. Tell a health care provider about: Any allergies you have. All medicines you are taking, including vitamins, herbs, eye drops, creams, and over-the-counter medicines. Any problems you or family members have had with anesthesia. Any bleeding problems you have. Any surgeries you have had. Any medical conditions you have. Whether you are pregnant or may be pregnant. What are the risks? Your health care provider will talk with you about risks. These may include: Infection. Bleeding. Allergic reactions to anesthesia. Damage to nerves or blood vessels. Failure of the device to work. This could require another surgery to replace it. What happens before  the procedure? You may have a physical exam, blood tests, and imaging tests, such as a chest X-ray. Follow instructions from your health care provider about what you may eat and drink Ask your health care provider about: Changing or stopping your regular medicines. These include any diabetes medicines or blood thinners you take. Taking medicines such as aspirin and ibuprofen. These medicines can thin your blood. Do not take them unless your health care provider tells you to. Taking over-the-counter medicines, vitamins, herbs, and supplements. Ask your health care provider: How your surgery site will be marked. What steps will be taken to help prevent infection. These steps may include: Removing hair at the surgery site. Washing skin with a soap that kills germs. Taking antibiotics. If you will be going home right after the procedure, plan to have a responsible adult: Take you home from the hospital or clinic. You will not be allowed to drive. Do not use any products that contain nicotine or tobacco before the procedure. These products include cigarettes, chewing tobacco, and vaping devices, such as e-cigarettes. If you need help quitting, ask your health care provider What happens during the procedure? An IV will be inserted into one of your veins. You may be given: A sedative. This helps you relax Anesthesia. This will: Numb certain areas of your body. Make you fall asleep for surgery. A small incision will be made on the left side of your upper chest. A pocket will be created under your skin. The device will be placed in the pocket. The incision will be closed with stitches (sutures) or adhesive strips. A bandage (dressing) will be placed over the incision.  The procedure may vary among health care providers and hospitals. What happens after the procedure? Your blood pressure, heart rate, breathing rate, and blood oxygen level will be monitored until you leave the hospital or  clinic. You may be able to go home on the day of your surgery. Before you go home: Your health care provider will program your recorder. You will learn how to trigger your device with a handheld activator. You will learn how to send recordings to your health care provider. You will get an ID card for your device, and you will be told when to use it. This information is not intended to replace advice given to you by your health care provider. Make sure you discuss any questions you have with your health care provider. Document Revised: 04/28/2022 Document Reviewed: 04/28/2022 Elsevier Patient Education  [place].

## 2022-10-14 DIAGNOSIS — F840 Autistic disorder: Secondary | ICD-10-CM | POA: Diagnosis not present

## 2022-10-14 DIAGNOSIS — F4312 Post-traumatic stress disorder, chronic: Secondary | ICD-10-CM | POA: Diagnosis not present

## 2022-10-15 DIAGNOSIS — F840 Autistic disorder: Secondary | ICD-10-CM | POA: Diagnosis not present

## 2022-10-15 DIAGNOSIS — F4312 Post-traumatic stress disorder, chronic: Secondary | ICD-10-CM | POA: Diagnosis not present

## 2022-10-16 DIAGNOSIS — F840 Autistic disorder: Secondary | ICD-10-CM | POA: Diagnosis not present

## 2022-10-16 DIAGNOSIS — Z713 Dietary counseling and surveillance: Secondary | ICD-10-CM | POA: Diagnosis not present

## 2022-10-16 DIAGNOSIS — F4312 Post-traumatic stress disorder, chronic: Secondary | ICD-10-CM | POA: Diagnosis not present

## 2022-10-17 DIAGNOSIS — F4312 Post-traumatic stress disorder, chronic: Secondary | ICD-10-CM | POA: Diagnosis not present

## 2022-10-17 DIAGNOSIS — F840 Autistic disorder: Secondary | ICD-10-CM | POA: Diagnosis not present

## 2022-10-20 ENCOUNTER — Telehealth: Payer: Self-pay

## 2022-10-20 ENCOUNTER — Other Ambulatory Visit: Payer: Self-pay

## 2022-10-20 ENCOUNTER — Encounter (HOSPITAL_COMMUNITY)
Admission: RE | Disposition: A | Discharge status: Home / Self Care | Payer: Self-pay | Source: Ambulatory Visit | Attending: Gastroenterology

## 2022-10-20 ENCOUNTER — Ambulatory Visit (HOSPITAL_COMMUNITY)
Admission: RE | Admit: 2022-10-20 | Discharge: 2022-10-20 | Disposition: A | Discharge status: Home / Self Care | Payer: BC Managed Care – PPO | Source: Ambulatory Visit | Attending: Gastroenterology | Admitting: Gastroenterology

## 2022-10-20 ENCOUNTER — Ambulatory Visit (HOSPITAL_COMMUNITY): Payer: BC Managed Care – PPO | Admitting: Anesthesiology

## 2022-10-20 ENCOUNTER — Encounter (HOSPITAL_COMMUNITY): Payer: Self-pay | Admitting: Gastroenterology

## 2022-10-20 DIAGNOSIS — Z9049 Acquired absence of other specified parts of digestive tract: Secondary | ICD-10-CM | POA: Diagnosis not present

## 2022-10-20 DIAGNOSIS — R634 Abnormal weight loss: Secondary | ICD-10-CM | POA: Insufficient documentation

## 2022-10-20 DIAGNOSIS — K2970 Gastritis, unspecified, without bleeding: Secondary | ICD-10-CM | POA: Insufficient documentation

## 2022-10-20 DIAGNOSIS — Z90710 Acquired absence of both cervix and uterus: Secondary | ICD-10-CM | POA: Insufficient documentation

## 2022-10-20 DIAGNOSIS — G40209 Localization-related (focal) (partial) symptomatic epilepsy and epileptic syndromes with complex partial seizures, not intractable, without status epilepticus: Secondary | ICD-10-CM | POA: Diagnosis not present

## 2022-10-20 DIAGNOSIS — K3189 Other diseases of stomach and duodenum: Secondary | ICD-10-CM | POA: Insufficient documentation

## 2022-10-20 DIAGNOSIS — Z9013 Acquired absence of bilateral breasts and nipples: Secondary | ICD-10-CM | POA: Insufficient documentation

## 2022-10-20 DIAGNOSIS — F509 Eating disorder, unspecified: Secondary | ICD-10-CM | POA: Insufficient documentation

## 2022-10-20 DIAGNOSIS — Z1501 Genetic susceptibility to malignant neoplasm of breast: Secondary | ICD-10-CM | POA: Diagnosis not present

## 2022-10-20 DIAGNOSIS — K219 Gastro-esophageal reflux disease without esophagitis: Secondary | ICD-10-CM | POA: Insufficient documentation

## 2022-10-20 DIAGNOSIS — K5909 Other constipation: Secondary | ICD-10-CM

## 2022-10-20 DIAGNOSIS — K2950 Unspecified chronic gastritis without bleeding: Secondary | ICD-10-CM | POA: Diagnosis not present

## 2022-10-20 DIAGNOSIS — F419 Anxiety disorder, unspecified: Secondary | ICD-10-CM | POA: Diagnosis not present

## 2022-10-20 DIAGNOSIS — F840 Autistic disorder: Secondary | ICD-10-CM | POA: Diagnosis not present

## 2022-10-20 DIAGNOSIS — R112 Nausea with vomiting, unspecified: Secondary | ICD-10-CM | POA: Insufficient documentation

## 2022-10-20 HISTORY — PX: BIOPSY: SHX5522

## 2022-10-20 HISTORY — DX: Unspecified osteoarthritis, unspecified site: M19.90

## 2022-10-20 HISTORY — PX: ESOPHAGOGASTRODUODENOSCOPY (EGD) WITH PROPOFOL: SHX5813

## 2022-10-20 LAB — GLUCOSE, CAPILLARY
Glucose-Capillary: 124 mg/dL — ABNORMAL HIGH (ref 70–99)
Glucose-Capillary: 127 mg/dL — ABNORMAL HIGH (ref 70–99)
Glucose-Capillary: 69 mg/dL — ABNORMAL LOW (ref 70–99)
Glucose-Capillary: 60 mg/dL — ABNORMAL LOW (ref 70–99)
Glucose-Capillary: 148 mg/dL — ABNORMAL HIGH (ref 70–99)

## 2022-10-20 SURGERY — ESOPHAGOGASTRODUODENOSCOPY (EGD) WITH PROPOFOL
Anesthesia: Monitor Anesthesia Care

## 2022-10-20 MED ORDER — DEXMEDETOMIDINE HCL IN NACL 80 MCG/20ML IV SOLN
INTRAVENOUS | Status: DC | PRN
  Administered 2022-10-20: 12 ug via BUCCAL

## 2022-10-20 MED ORDER — LEVETIRACETAM IN NACL 500 MG/100ML IV SOLN
500.0000 mg | Freq: Once | INTRAVENOUS | Status: AC
  Administered 2022-10-20: 500 mg via INTRAVENOUS
  Filled 2022-10-20: qty 100

## 2022-10-20 MED ORDER — LORAZEPAM 2 MG/ML IJ SOLN
INTRAMUSCULAR | Status: DC | PRN
  Administered 2022-10-20: 1 mg via INTRAVENOUS
  Administered 2022-10-20 (×2): .5 mg via INTRAVENOUS

## 2022-10-20 MED ORDER — HEPARIN SOD (PORK) LOCK FLUSH 100 UNIT/ML IV SOLN
INTRAVENOUS | Status: AC
  Filled 2022-10-20: qty 5

## 2022-10-20 MED ORDER — DEXTROSE 50 % IV SOLN
25.0000 mL | Freq: Once | INTRAVENOUS | Status: AC
  Administered 2022-10-20: 25 mL via INTRAVENOUS

## 2022-10-20 MED ORDER — ALBUMIN HUMAN 5 % IV SOLN
12.5000 g | Freq: Once | INTRAVENOUS | Status: AC
  Administered 2022-10-20: 12.5 g via INTRAVENOUS

## 2022-10-20 MED ORDER — LACTATED RINGERS IV SOLN: INTRAVENOUS | Status: DC | PRN

## 2022-10-20 MED ORDER — ALBUMIN HUMAN 5 % IV SOLN
INTRAVENOUS | Status: AC
  Filled 2022-10-20: qty 250

## 2022-10-20 MED ORDER — FENTANYL CITRATE (PF) 100 MCG/2ML IJ SOLN: 25.0000 ug | INTRAMUSCULAR | Status: DC | PRN

## 2022-10-20 MED ORDER — PROPOFOL 1000 MG/100ML IV EMUL
INTRAVENOUS | Status: AC
  Filled 2022-10-20: qty 100

## 2022-10-20 MED ORDER — DEXTROSE 50 % IV SOLN
INTRAVENOUS | Status: AC
  Filled 2022-10-20: qty 50

## 2022-10-20 MED ORDER — MIDAZOLAM HCL 2 MG/2ML IJ SOLN
INTRAMUSCULAR | Status: AC
  Administered 2022-10-20: 1 mg via INTRAVENOUS
  Filled 2022-10-20: qty 2

## 2022-10-20 MED ORDER — SODIUM CHLORIDE 0.9 % IV SOLN: INTRAVENOUS | Status: DC

## 2022-10-20 MED ORDER — ONDANSETRON HCL 4 MG/2ML IJ SOLN: 4.0000 mg | Freq: Once | INTRAMUSCULAR | Status: DC | PRN

## 2022-10-20 MED ORDER — MIDAZOLAM HCL 5 MG/5ML IJ SOLN
INTRAMUSCULAR | Status: DC | PRN
  Administered 2022-10-20: 2 mg via INTRAVENOUS

## 2022-10-20 MED ORDER — MIDAZOLAM HCL (PF) 5 MG/ML IJ SOLN: 2.0000 mg | Freq: Once | INTRAMUSCULAR | Status: DC | PRN

## 2022-10-20 MED ORDER — HEPARIN SOD (PORK) LOCK FLUSH 100 UNIT/ML IV SOLN
500.0000 [IU] | INTRAVENOUS | Status: AC | PRN
  Administered 2022-10-20: 500 [IU]
  Filled 2022-10-20: qty 5

## 2022-10-20 MED ORDER — MIDAZOLAM HCL 2 MG/2ML IJ SOLN
INTRAMUSCULAR | Status: AC
  Filled 2022-10-20: qty 2

## 2022-10-20 MED ORDER — ONDANSETRON HCL 4 MG/2ML IJ SOLN
INTRAMUSCULAR | Status: DC | PRN
  Administered 2022-10-20: 4 mg via INTRAVENOUS

## 2022-10-20 MED ORDER — MIDAZOLAM HCL 2 MG/2ML IJ SOLN: 1.0000 mg | Freq: Once | INTRAMUSCULAR | Status: AC

## 2022-10-20 MED ORDER — LORAZEPAM 2 MG/ML IJ SOLN
INTRAMUSCULAR | Status: AC
  Filled 2022-10-20: qty 1

## 2022-10-20 MED ORDER — DEXTROSE 50 % IV SOLN
12.5000 g | Freq: Once | INTRAVENOUS | Status: AC
  Administered 2022-10-20: 12.5 g via INTRAVENOUS

## 2022-10-20 MED ORDER — PROPOFOL 500 MG/50ML IV EMUL
INTRAVENOUS | Status: DC | PRN
  Administered 2022-10-20 (×2): 20 mg via INTRAVENOUS
  Administered 2022-10-20: 140 ug/kg/min via INTRAVENOUS

## 2022-10-20 MED ORDER — PROPOFOL 500 MG/50ML IV EMUL
INTRAVENOUS | Status: AC
  Filled 2022-10-20: qty 50

## 2022-10-20 SURGICAL SUPPLY — 15 items

## 2022-10-20 NOTE — H&P (Signed)
Referring Provider: No ref. provider found Primary Care Physician:  Dorothyann Peng, NP   Indication for upper endoscopy:  Nausea, vomiting, weight loss   IMPRESSION:  Nausea, vomiting, and abdominal pain worsened since mastectomy 02/2022  PLAN: EGD with biopsies - to be performed at the hospital   HPI: [name] is a 31 y.o. female referred by Dr. Barry Dienes for nausea, vomiting, and weight loss.  She is BRCA2 positive and had bilateral nipple sparing mastectomies 02/25/22.  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 Her therapist accompanies her to this appointment.   She was recently referred to cardiology for evaluation of bradycardia and endocrinology for hypoglycemia. She has had a cholecystectomy, appendectomy, hysterectomy and BSO.  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   Developed intermittent nausea and vomiting as a teenager. Initially attributed to MERFF. Would frequently go 10 days between symptoms.   Frequency of symptoms increased to 1-2 times daily after her mastectomies.  Associated heartburn, anorexia, and  aramping, stabbing abdominal pain that is predominantly in the LUQ. No identified food triggers. But, has been avoiding eating to avoid the pain. When episodes occur, they last an hour.  Frequent vomiting of foods she consumed the day before, occasionally with bloody emesis.  Frequent sore throat and chest pain.   Her abdominal pain is different from disordered eating abdominal pain she has had in the past, and different from the cyclic vomiting syndrome symptoms she has long experienced. She wonders if this may be due to food allergy, as during one of the more recent episodes she had to use her EpiPen x3 doses.  She describes developing numbness sensation in her tongue, swelling sensation in her mouth and rash all over  her body associated with bloody emesis.  Her PCP is concerned that inadequate nutrition due to disordered eating may be contributing to her symptoms.   Also has left-sided pain, the entire body, such that she is unable to sleep on that side. She has been previously told that this might be due to splenic irritation.  She has had significant constipation related to her seizures medications. She was previous on Miralax, stool softeners, and is now essentially dependent on stimulant laxatives. No prior prescription therapies have been used for constipation. Sense of complete evacuation. No straining.   She has near daily headaches that previously required migraine medications. She no longer has aura.   Seizures are well controlled and the neurologists don't want to change her medications.   Treatment trials include: sucralfate, pantoprazole  Changing the timing of her medications has not changed the symptoms Avoid gluten and dairy for at least 2 weeks without change Bland diet is easier If she stops eating at 4pm her lifetime symptoms are improved but she becomes hypoglycemic No identified food triggers No relief from hot showers  She has unintentionally lost 12-13 pounds since her mastectomies  No marijuana, alcohol, street drugs.   Currently working with a nutritionist to increase her caloric intake.  Enjoys running and horse-back riding.  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   Father with migraines, serial colon polyps, GI symptoms including abdominal pain, post-prandial symptoms but no nausea or vomiting.  There is no known family history of colon cancer or polyps. No family history of stomach cancer or other GI  malignancy. No family history of inflammatory bowel disease or celiac.    Past Medical History:  Diagnosis Date   Acne    Anemia    hx of   Anxiety    Arthritis    Asthma    Carpal tunnel syndrome    right   Complex partial seizures ([place])    last seizure [id number]   Complication of anesthesia    slow to wake up, disoriented, hx of  mild seizure after surgery -2019 gallbladder    Coordination problem    Cyst of brain    states is collection of scar tissue - was kicked by a horse as a teenager   Difficult intravenous access    RIGHT CHEST PAC   Eating disorder    Gallstones    GERD (gastroesophageal reflux disease)    Head injury, intracranial, with concussion    Hearing loss    both ears   History of asthma    no current med.   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)    Migraines    Ovarian cyst    Right   Pneumonia 12/2018   hx of    PONV (postoperative nausea and vomiting)    PTSD (post-traumatic stress disorder)    PTSD (post-traumatic stress disorder)    Seizures ([place])    complex partial   Shoulder dislocation 09/2018   Left   Shoulder subluxation, left    Syncope    Tremor, unspecified    bilateral arms   Wears glasses    Wears hearing aid    bilateral    Past Surgical History:  Procedure Laterality Date   ABDOMINAL HYSTERECTOMY     APPENDECTOMY     CHOLECYSTECTOMY N/A 10/05/2018   Procedure: LAPAROSCOPIC CHOLECYSTECTOMY WITH INTRAOPERATIVE CHOLANGIOGRAM ERAS PATHWAY;  Surgeon: Alphonsa Overall, MD;  Location: [facility];  Service: General;  Laterality: N/A;   CYSTOSCOPY N/A 03/12/2021   Procedure: CYSTOSCOPY;  Surgeon: Sherlyn Hay, DO;  Location: [facility];  Service: Gynecology;  Laterality: N/A;   LAPAROSCOPIC APPENDECTOMY  11/26/2011   Procedure: APPENDECTOMY LAPAROSCOPIC;  Surgeon: Gayland Curry, MD;  Location: [facility];  Service: General;  Laterality: N/A;   left  shoulder manipulation     in er multilple times last done july 2021   MASTECTOMY     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   NIPPLE SPARING MASTECTOMY Bilateral 02/25/2022   Procedure: BILATERAL NIPPLE-SPARING MASTECTOMY;  Surgeon: Stark Klein, MD;  Location: [facility];  Service: General;  Laterality: Bilateral;   PORT-A-CATH REMOVAL N/A 12/10/2021   Procedure: REMOVAL PORT-A-CATH AND REPLACEMENT;  Surgeon: [name], MD;  Location: [facility];  Service: General;  Laterality: N/A;   PORTACATH PLACEMENT Right 08/28/2017   Procedure: POWER PORT PLACEMENT;  Surgeon: Alphonsa Overall, MD;  Location: [facility];  Service: General;  Laterality: Right;   PORTACATH PLACEMENT N/A 12/04/2017   Procedure: INSERTION PORT-A-CATH;  Surgeon: Alphonsa Overall, MD;  Location: [facility];  Service: General;  Laterality: N/A;   RADIOLOGY WITH ANESTHESIA Left 10/25/2019   Procedure: MRI  LEFT SHOLDER WITHOUT CONTRAST;  Surgeon: Radiologist, Medication, MD;  Location: [facility];  Service: Radiology;  Laterality: Left;  RADIOLOGY WITH ANESTHESIA Bilateral 11/19/2021   Procedure: MRI WITH ANESTHESIA OF BILATERAL BREASTS WITH AND WITHOUT CONTRAST;  Surgeon: Radiologist, Medication, MD;  Location: [facility];  Service: Radiology;  Laterality: Bilateral;   REPAIR ANKLE LIGAMENT Left    x 3   TOTAL LAPAROSCOPIC HYSTERECTOMY WITH SALPINGECTOMY Bilateral 03/12/2021   Procedure: TOTAL LAPAROSCOPIC HYSTERECTOMY WITH SALPINGECTOMY;  Surgeon: Sherlyn Hay, DO;  Location: [facility];  Service: Gynecology;  Laterality: Bilateral;    No current facility-administered medications for this encounter.    Allergies as of 09/08/2022 - Review Complete 09/08/2022  Allergen Reaction Noted   Cefzil [cefprozil] Anaphylaxis, Shortness Of Breath, and Rash 08/24/2017   Cephalosporins Anaphylaxis, Shortness Of Breath, and Rash 09/15/2013   Latex Shortness Of Breath and Rash 10/10/2014   Penicillins  Anaphylaxis, Hives, Rash, and Other (See Comments) 10/08/2017   Ciprofloxacin Rash 03/03/2022   Shellfish allergy Hives and Rash 11/26/2011   Grass pollen(k-o-r-t-swt vern) Hives 12/10/2021   Morphine and related Nausea And Vomiting 07/05/2020   Tegaderm ag mesh [silver] Rash 09/30/2018   Vancomycin Hives, Itching, and Rash 06/19/2021    Family History  Problem Relation Age of Onset   Polymyalgia rheumatica Mother    Heart disease Mother    Sudden Cardiac Death Father        long QT syndrome    Heart disease Maternal Grandmother    Breast cancer Maternal Grandmother    Stomach cancer Maternal Grandmother    Heart disease Maternal Grandfather    Stroke Maternal Grandfather    Diabetes Maternal Grandfather    COPD Maternal Grandfather    Kidney failure Maternal Grandfather    Colon cancer Paternal Grandmother 75   Heart disease Paternal Grandmother    Heart disease Paternal Grandfather    Migraines Paternal Grandfather    Esophageal cancer Cousin    Pancreatic cancer Neg Hx      Physical Exam: General:   Alert,  well-nourished, pleasant and cooperative in NAD, pale Head:  Normocephalic and atraumatic. Eyes:  Sclera clear, no icterus.   Conjunctiva pink. Ears:  Normal auditory acuity. Nose:  No deformity, discharge,  or lesions. Mouth:  No deformity or lesions.   Neck:  Supple; no masses or thyromegaly. Lungs:  Clear throughout to auscultation.   No wheezes. Heart:  Regular rate and rhythm; no murmurs. Abdomen:  Soft, thin, nearly scaphoid, nontender, nondistended, normal bowel sounds, no rebound or guarding. No hepatosplenomegaly.   Rectal:  Deferred  Msk:  Symmetrical. No boney deformities LAD: No inguinal or umbilical LAD Extremities:  No clubbing or edema. Neurologic:  Alert and  oriented x4;  grossly nonfocal Skin:  Intact without significant lesions or rashes. Psych:  Alert and cooperative. Normal mood and affect.     Maryem Shuffler L. Tarri Glenn, MD, MPH 10/20/2022,  9:50 AM

## 2022-10-20 NOTE — Telephone Encounter (Signed)
OV scheduled for 11/25/22 at 8:30 am with Jaclyn Shaggy, NP. Pt notified via mychart.

## 2022-10-20 NOTE — Transfer of Care (Signed)
Immediate Anesthesia Transfer of Care Note  Patient: Tashayla Therien  Procedure(s) Performed: ESOPHAGOGASTRODUODENOSCOPY (EGD) WITH PROPOFOL BIOPSY  Patient Location: PACU  Anesthesia Type:MAC  Level of Consciousness: drowsy and patient cooperative  Airway & Oxygen Therapy: Patient Spontanous Breathing and Patient connected to face mask oxygen  Post-op Assessment: Report given to RN, Post -op Vital signs reviewed and stable, and Patient moving all extremities  Post vital signs: Reviewed and stable  Last Vitals:  Vitals Value Taken Time  BP 86/49 10/20/22 1145  Temp 36.6 C 10/20/22 1140  Pulse 50 10/20/22 1147  Resp 14 10/20/22 1147  SpO2 100 % 10/20/22 1147  Vitals shown include unvalidated device data.  Last Pain:  Vitals:   10/20/22 1140  TempSrc:   PainSc: Asleep         Complications: No notable events documented.

## 2022-10-20 NOTE — Anesthesia Procedure Notes (Signed)
Procedure Name: MAC Date/Time: 10/20/2022 11:18 AM  Performed by: Victoriano Lain, CRNAPre-anesthesia Checklist: Patient identified, Emergency Drugs available, Suction available, Patient being monitored and Timeout performed Patient Re-evaluated:Patient Re-evaluated prior to induction Oxygen Delivery Method: Simple face mask Placement Confirmation: positive ETCO2 Dental Injury: Teeth and Oropharynx as per pre-operative assessment

## 2022-10-20 NOTE — Progress Notes (Signed)
Patient seizing, anesthesia at the bedside. 0.5 mg ativan given by Dr. Doroteo Glassman.

## 2022-10-20 NOTE — Progress Notes (Signed)
Patient having seizure-like twitching. Anesthesia at the bedside. 1 mg ativan given by Dr. Doroteo Glassman.

## 2022-10-20 NOTE — Progress Notes (Signed)
Patient experiencing another seizure. Anesthesia at bedside. 0.5 mg ativan given by Dr. Doroteo Glassman.

## 2022-10-20 NOTE — Anesthesia Preprocedure Evaluation (Addendum)
Anesthesia Evaluation  Patient identified by MRN, date of birth, ID band Patient awake    Reviewed: Allergy & Precautions, NPO status , Patient's Chart, lab work & pertinent test results  History of Anesthesia Complications (+) PONV and history of anesthetic complications (epilepsy poorly controlled- has seizures after most anesthetics)  Airway Mallampati: III  TM Distance: >3 FB Neck ROM: Full    Dental  (+) Teeth Intact, Dental Advisory Given   Pulmonary asthma    Pulmonary exam normal breath sounds clear to auscultation       Cardiovascular negative cardio ROS Normal cardiovascular exam Rhythm:Regular Rate:Normal     Neuro/Psych  Headaches, Seizures - (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)- multiple per day), Poorly Controlled,  PSYCHIATRIC DISORDERS Anxiety     Multiple antiepileptics- keppra, topiramate, ativan, vimpat    GI/Hepatic Neg liver ROS,GERD  Controlled,,Weight loss, N/V   Endo/Other  negative endocrine ROS    Renal/GU negative Renal ROS  negative genitourinary   Musculoskeletal negative musculoskeletal ROS (+)    Abdominal   Peds  Hematology  (+) Blood dyscrasia, anemia   Anesthesia Other Findings S/p mastectomy 02/2022  Reproductive/Obstetrics negative OB ROS                             Anesthesia Physical Anesthesia Plan  ASA: 3  Anesthesia Plan: MAC   Post-op Pain Management:    Induction:   PONV Risk Score and Plan: 2 and Propofol infusion and TIVA  Airway Management Planned: Natural Airway and Simple Face Mask  Additional Equipment: None  Intra-op Plan:   Post-operative Plan:   Informed Consent: I have reviewed the patients History and Physical, chart, labs and discussed the procedure including the risks, benefits and alternatives for the proposed anesthesia with the patient or authorized representative who has indicated his/her understanding  and acceptance.       Plan Discussed with: CRNA  Anesthesia Plan Comments: (After mastectomy 02/2022: "Pt experienced multiple seizures in PACU similar to all her previous anesthetics. Treated with versed and ativan. Longest seizure lasted ~1 min. No respiratory compromise. Vital signs remained stable. After final seizure, she was monitored in PACU for ~1 hour. She was discharged home in stable condition."  Did not take any antiepileptics this AM because she gets nauseous with meds on empty stomach- will order IV keppra 527m for preop. Will also plan to give IV versed prior to procedure.  Has therapist with her at bedside, would like to have her with her as much as possible perioperatively. )       Anesthesia Quick Evaluation

## 2022-10-20 NOTE — Telephone Encounter (Signed)
-----   Message from [place], MD sent at 10/20/2022 11:41 AM EST ----- Office follow-up with me or any APP - next week or later - to review procedure results and corresponding path findings.  Thanks.  KLB

## 2022-10-20 NOTE — Anesthesia Postprocedure Evaluation (Addendum)
Anesthesia Post Note  Patient: [name]  Procedure(s) Performed: ESOPHAGOGASTRODUODENOSCOPY (EGD) WITH PROPOFOL BIOPSY     Patient location during evaluation: PACU Anesthesia Type: MAC Level of consciousness: awake and alert Pain management: pain level controlled Vital Signs Assessment: post-procedure vital signs reviewed and stable Respiratory status: spontaneous breathing, nonlabored ventilation and respiratory function stable Cardiovascular status: blood pressure returned to baseline and stable Postop Assessment: no apparent nausea or vomiting Anesthetic complications: no Comments: Mild seizure-like activity in PACU resolved with IV ativan, seemingly similar to her other PACU stays. Long PACU stay similar to other anesthetics d/t lethargy from repeated doses of ativan.    No notable events documented.  Last Vitals:  Vitals:   10/20/22 1225 10/20/22 1230  BP: 92/61 (!) 84/64  Pulse: (!) 47 71  Resp: 14 14  Temp:    SpO2: 100% 100%    Last Pain:  Vitals:   10/20/22 1230  TempSrc:   PainSc: [place]

## 2022-10-20 NOTE — Op Note (Signed)
[facility] Patient Name: [name] Procedure Date: 10/20/2022 MRN: [id number] Attending MD: Thornton Park MD, MD, [id number] Date of Birth: [birth year] CSN: [id number] Age: 31 Admit Type: Outpatient Procedure:                Upper GI endoscopy Indications:              Nausea with vomiting, Weight loss Providers:                Thornton Park MD, MD, Jaci Carrel, RN,                            Fransico Setters Mbumina, Technician Referring MD:              Medicines:                Monitored Anesthesia Care Complications:            No immediate complications. Estimated Blood Loss:     Estimated blood loss was minimal. Procedure:                Pre-Anesthesia Assessment:                           - Prior to the procedure, a History and Physical                            was performed, and patient medications and                            allergies were reviewed. The patient's tolerance of                            previous anesthesia was also reviewed. The risks                            and benefits of the procedure and the sedation                            options and risks were discussed with the patient.                            All questions were answered, and informed consent                            was obtained. Prior Anticoagulants: The patient has                            taken no anticoagulant or antiplatelet agents. ASA                            Grade Assessment: III - A patient with severe                            systemic disease. After reviewing the risks and  benefits, the patient was deemed in satisfactory                            condition to undergo the procedure.                           After obtaining informed consent, the endoscope was                            passed under direct vision. Throughout the                            procedure, the patient's blood pressure, pulse, and                             oxygen saturations were monitored continuously. The                            GIF-H190 ([id number]) Olympus endoscope was introduced                            through the mouth, and advanced to the third part                            of duodenum. The upper GI endoscopy was                            accomplished without difficulty. The patient                            tolerated the procedure well. Scope In: Scope Out: Findings:      The examined esophagus was normal. Biopsies were obtained from the       mid/proximal and distal esophagus with cold forceps.      Diffuse mildly erythematous mucosa without bleeding was found in the       gastric body. Biopsies were taken from the antrum, body, and fundus with       a cold forceps for histology. Estimated blood loss was minimal.      The examined duodenum was normal. Biopsies were taken with a cold       forceps for histology. Estimated blood loss was minimal.      The cardia and gastric fundus were normal on retroflexion.      The exam was otherwise without abnormality. Impression:               - Normal esophagus.                           - Erythematous mucosa in the gastric body. Biopsied.                           - Normal examined duodenum. Biopsied.                           - The examination was otherwise normal.                           -  Biopsies were taken with a cold forceps for                            evaluation of eosinophilic esophagitis. Moderate Sedation:      Not Applicable - Patient had care per Anesthesia. Recommendation:           - Patient has a contact number available for                            emergencies. The signs and symptoms of potential                            delayed complications were discussed with the                            patient. Return to normal activities tomorrow.                            Written discharge instructions were provided to the                             patient.                           - Resume previous diet.                           - Continue present medications.                           - Await pathology results.                           - No aspirin, ibuprofen, naproxen, or other                            non-steroidal anti-inflammatory drugs. Procedure Code(s):        --- Professional ---                           [phone], Esophagogastroduodenoscopy, flexible,                            transoral; with biopsy, single or multiple Diagnosis Code(s):        --- Professional ---                           K31.89, Other diseases of stomach and duodenum                           R11.2, Nausea with vomiting, unspecified                           R63.4, Abnormal weight loss CPT copyright 2022 American [employer] Association. All rights reserved. The codes documented in this report are preliminary and upon coder review may  be revised to meet current compliance requirements. Joelene Millin  Rawley Harju MD, MD 10/20/2022 11:41:30 AM This report has been signed electronically. Number of Addenda: 0

## 2022-10-21 DIAGNOSIS — F840 Autistic disorder: Secondary | ICD-10-CM | POA: Diagnosis not present

## 2022-10-21 DIAGNOSIS — F4312 Post-traumatic stress disorder, chronic: Secondary | ICD-10-CM | POA: Diagnosis not present

## 2022-10-22 ENCOUNTER — Encounter (HOSPITAL_COMMUNITY): Payer: Self-pay | Admitting: Gastroenterology

## 2022-10-22 DIAGNOSIS — F840 Autistic disorder: Secondary | ICD-10-CM | POA: Diagnosis not present

## 2022-10-23 ENCOUNTER — Telehealth: Payer: BC Managed Care – PPO | Admitting: Adult Health

## 2022-10-23 ENCOUNTER — Telehealth (INDEPENDENT_AMBULATORY_CARE_PROVIDER_SITE_OTHER): Payer: BC Managed Care – PPO | Admitting: Adult Health

## 2022-10-23 DIAGNOSIS — R55 Syncope and collapse: Secondary | ICD-10-CM

## 2022-10-23 DIAGNOSIS — R634 Abnormal weight loss: Secondary | ICD-10-CM

## 2022-10-23 DIAGNOSIS — F4312 Post-traumatic stress disorder, chronic: Secondary | ICD-10-CM | POA: Diagnosis not present

## 2022-10-23 DIAGNOSIS — F840 Autistic disorder: Secondary | ICD-10-CM | POA: Diagnosis not present

## 2022-10-23 DIAGNOSIS — Z713 Dietary counseling and surveillance: Secondary | ICD-10-CM | POA: Diagnosis not present

## 2022-10-23 NOTE — Telephone Encounter (Signed)
Appointment reminder letter mailed home.

## 2022-10-24 ENCOUNTER — Encounter: Payer: Self-pay | Admitting: Adult Health

## 2022-10-24 NOTE — Progress Notes (Signed)
Virtual Visit via Video Note  I connected withNAME@ on 10/24/22 at  4:00 PM EST by a video enabled telemedicine application and verified that I am speaking with the correct person using two identifiers.  Location patient: home Location provider:work or home office Persons participating in the virtual visit: patient, provider  I discussed the limitations of evaluation and management by telemedicine and the availability of in person appointments. The patient expressed understanding and agreed to proceed.   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  This is a follow up appointment  She has been seen by Dr. Tarri Glenn at [facility] or unintentional weight loss. She had her endoscopy done which was actively normal except for some mildly erythematous mucosa found in the gastric body.  Reports that she has been having a steady weight at about 96 pounds . She still has some nausea and vomiting from time to time.  Additionally she is going to have a loop recorder placed early next week due to episodes of syncope while exercising.  Her father had sudden cardiac death at a young age, she is wondering if it would be a good idea to have a  defibrillator placed since she is experiencing the same symptoms that her father did  ROS: See pertinent positives and negatives per HPI.  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)    Migraines    Ovarian cyst    Right   Pneumonia 12/2018   hx of    PONV (postoperative nausea and vomiting)    PTSD (post-traumatic stress disorder)    PTSD (post-traumatic stress disorder)    Seizures ([place])    complex partial   Shoulder dislocation 09/2018   Left   Shoulder subluxation, left    Syncope    Tremor, unspecified    bilateral arms   Wears glasses    Wears hearing aid  bilateral    Past Surgical History:  Procedure Laterality Date   ABDOMINAL HYSTERECTOMY     APPENDECTOMY     BIOPSY  10/20/2022   Procedure: BIOPSY;  Surgeon: Thornton Park, MD;  Location: [facility];  Service: Gastroenterology;;   CHOLECYSTECTOMY N/A 10/05/2018   Procedure: LAPAROSCOPIC CHOLECYSTECTOMY WITH INTRAOPERATIVE CHOLANGIOGRAM ERAS PATHWAY;  Surgeon: Alphonsa Overall, MD;  Location: [facility];  Service: General;  Laterality: N/A;   CYSTOSCOPY N/A 03/12/2021   Procedure: CYSTOSCOPY;  Surgeon: Sherlyn Hay, DO;  Location: [place];  Service:  Gynecology;  Laterality: N/A;   ESOPHAGOGASTRODUODENOSCOPY (EGD) WITH PROPOFOL N/A 10/20/2022   Procedure: ESOPHAGOGASTRODUODENOSCOPY (EGD) WITH PROPOFOL;  Surgeon: Thornton Park, MD;  Location: [facility];  Service: Gastroenterology;  Laterality: N/A;   LAPAROSCOPIC APPENDECTOMY  11/26/2011   Procedure: APPENDECTOMY LAPAROSCOPIC;  Surgeon: Gayland Curry, MD;  Location: [place];  Service: General;  Laterality: N/A;   left shoulder manipulation     in er multilple times last done july 2021   MASTECTOMY     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   NIPPLE SPARING MASTECTOMY Bilateral 02/25/2022   Procedure: BILATERAL NIPPLE-SPARING MASTECTOMY;  Surgeon: Stark Klein, MD;  Location: [place];  Service: General;  Laterality: Bilateral;   PORT-A-CATH REMOVAL N/A 12/10/2021   Procedure: REMOVAL PORT-A-CATH AND REPLACEMENT;  Surgeon: [name], MD;  Location: [place];  Service: General;  Laterality: N/A;   PORTACATH PLACEMENT Right 08/28/2017   Procedure: POWER PORT PLACEMENT;  Surgeon: Alphonsa Overall, MD;  Location: [place];  Service: General;  Laterality: Right;   PORTACATH PLACEMENT N/A 12/04/2017   Procedure: INSERTION PORT-A-CATH;  Surgeon: Alphonsa Overall, MD;  Location: [place];  Service: General;  Laterality: N/A;   RADIOLOGY WITH ANESTHESIA Left 10/25/2019   Procedure: MRI  LEFT SHOLDER WITHOUT CONTRAST;  Surgeon: Radiologist, Medication, MD;  Location: [place];  Service: Radiology;  Laterality: Left;   RADIOLOGY WITH ANESTHESIA Bilateral 11/19/2021   Procedure: MRI WITH ANESTHESIA OF BILATERAL BREASTS WITH AND WITHOUT CONTRAST;  Surgeon: Radiologist, Medication, MD;  Location: [place];  Service: Radiology;  Laterality: Bilateral;   REPAIR ANKLE LIGAMENT Left    x 3   TOTAL LAPAROSCOPIC HYSTERECTOMY WITH SALPINGECTOMY Bilateral 03/12/2021   Procedure: TOTAL LAPAROSCOPIC HYSTERECTOMY WITH SALPINGECTOMY;  Surgeon: Sherlyn Hay, DO;  Location:  [place];  Service: Gynecology;  Laterality: Bilateral;    Family History  Problem Relation Age of Onset   Polymyalgia rheumatica Mother    Heart disease Mother    Sudden Cardiac Death Father        long QT syndrome    Heart disease Maternal Grandmother    Breast cancer Maternal Grandmother    Stomach cancer Maternal Grandmother    Heart disease Maternal Grandfather    Stroke Maternal Grandfather    Diabetes Maternal Grandfather    COPD Maternal Grandfather    Kidney failure Maternal Grandfather    Colon cancer Paternal Grandmother 25   Heart disease Paternal Grandmother    Heart disease Paternal Grandfather    Migraines Paternal Grandfather    Esophageal cancer Cousin    Pancreatic cancer Neg Hx        Current Outpatient Medications:    acetaminophen (TYLENOL) 325 MG tablet, Take 2 tablets (650 mg total) by mouth every 6 (six) hours as needed (pain)., Disp: , Rfl:    albuterol (VENTOLIN HFA) 108 (90 Base) MCG/ACT inhaler, Inhale 2 puffs into the lungs  every 6 (six) hours as needed for wheezing or shortness of breath., Disp: 6.7 g, Rfl: 3   buPROPion (WELLBUTRIN XL) 150 MG 24 hr tablet, Take 150 mg by mouth See admin instructions. Take with 300 mg for a total of 450 mg at bedtime, Disp: , Rfl:    buPROPion (WELLBUTRIN XL) 300 MG 24 hr tablet, Take 300 mg by mouth every morning., Disp: , Rfl:    busPIRone (BUSPAR) 15 MG tablet, Take 15 mg by mouth 2 (two) times daily., Disp: , Rfl:    clindamycin (CLEOCIN T) 1 % lotion, Apply 1 application  topically daily. Apply to affected areas of the face 2 times a day, Disp: , Rfl: 1   Continuous Blood Gluc Sensor (FREESTYLE LIBRE 2 SENSOR) MISC, USE WITH LIBRE APP, Disp: 6 each, Rfl: 2   Continuous Blood Gluc Transmit (DEXCOM G6 TRANSMITTER) MISC, Use with dexcom receiver and sensor, Disp: 1 each, Rfl: 3   diphenhydrAMINE (BENADRYL) 25 MG tablet, Take 50 mg by mouth at bedtime., Disp: , Rfl:    Glucagon, rDNA, (GLUCAGON EMERGENCY) 1 MG KIT,  Use PRN for hypoglycemia, Disp: 1 kit, Rfl: 3   glucose monitoring kit (FREESTYLE) monitoring kit, 1 each by Does not apply route as needed for other. Free style libre on right arm, Disp: , Rfl:    Lacosamide (VIMPAT) 100 MG TABS, Take 1 tablet in AM, 2 tablets in PM, Disp: 90 tablet, Rfl: 5   levETIRAcetam (KEPPRA) 500 MG tablet, Take 1 tablet twice a day (Patient taking differently: Take 500 mg by mouth at bedtime.), Disp: 60 tablet, Rfl: 11   LORazepam (ATIVAN) 0.5 MG tablet, Take 1 tablet as needed for seizure. Do not take more than 2-3 a week. (Patient taking differently: Take 0.5 mg by mouth daily as needed for seizure. Do not take more than 2-3 a week.), Disp: 10 tablet, Rfl: 5   montelukast (SINGULAIR) 10 MG tablet, TAKE 1 TABLET BY MOUTH EVERYDAY AT BEDTIME, Disp: 90 tablet, Rfl: 3   MOTEGRITY 2 MG TABS, TAKE 1 TABLET BY MOUTH EVERY DAY, Disp: 30 tablet, Rfl: 5   Plecanatide (TRULANCE) 3 MG TABS, Take 3 mg by mouth daily., Disp: 90 tablet, Rfl: 0   topiramate (TOPAMAX) 25 MG tablet, Take 50 mg by mouth at bedtime., Disp: , Rfl:   Current Facility-Administered Medications:    alteplase (CATHFLO ACTIVASE) injection 2 mg, 2 mg, Intracatheter, Once, Tricia [place], [name], NP   alteplase (CATHFLO ACTIVASE) injection 2 mg, 2 mg, Intracatheter, Once PRN, Alexea Blase, [name], NP  EXAM:  VITALS per patient if applicable:  GENERAL: alert, oriented, appears well and in no acute distress  HEENT: atraumatic, conjunttiva clear, no obvious abnormalities on inspection of external nose and ears  NECK: normal movements of the head and neck  LUNGS: on inspection no signs of respiratory distress, breathing rate appears normal, no obvious gross SOB, gasping or wheezing  CV: no obvious cyanosis  MS: moves all visible extremities without noticeable abnormality  PSYCH/NEURO: pleasant and cooperative, no obvious depression or anxiety, speech and thought processing grossly intact  ASSESSMENT AND  PLAN:  Discussed the following assessment and plan:  1. Syncope, unspecified syncope type -Advise discussing defibulator with her cardiologist   2. Unintentional weight loss - Follow up with GI as directed.  - Eat small meals throughout the day to minimize nausea and vomiting.       I discussed the assessment and treatment plan with the patient. The patient was provided an opportunity  to ask questions and all were answered. The patient agreed with the plan and demonstrated an understanding of the instructions.   The patient was advised to call back or seek an in-person evaluation if the symptoms worsen or if the condition fails to improve as anticipated.   Dorothyann Peng, NP   Time spent with patient today was 32 minutes which consisted of chart review, discussing diagnosis, work up, treatment answering questions and documentation.

## 2022-10-25 LAB — SURGICAL PATHOLOGY

## 2022-10-27 ENCOUNTER — Ambulatory Visit: Payer: BC Managed Care – PPO | Admitting: Neurology

## 2022-10-27 DIAGNOSIS — F840 Autistic disorder: Secondary | ICD-10-CM | POA: Diagnosis not present

## 2022-10-28 ENCOUNTER — Other Ambulatory Visit: Payer: Self-pay

## 2022-10-28 DIAGNOSIS — F840 Autistic disorder: Secondary | ICD-10-CM | POA: Diagnosis not present

## 2022-10-28 DIAGNOSIS — F4312 Post-traumatic stress disorder, chronic: Secondary | ICD-10-CM | POA: Diagnosis not present

## 2022-10-28 MED ORDER — OMEPRAZOLE 40 MG PO CPDR: 40.0000 mg | DELAYED_RELEASE_CAPSULE | Freq: Two times a day (BID) | ORAL | 0 refills | Status: DC

## 2022-10-30 DIAGNOSIS — F4312 Post-traumatic stress disorder, chronic: Secondary | ICD-10-CM | POA: Diagnosis not present

## 2022-10-30 DIAGNOSIS — F840 Autistic disorder: Secondary | ICD-10-CM | POA: Diagnosis not present

## 2022-10-31 ENCOUNTER — Encounter: Payer: Self-pay | Admitting: Internal Medicine

## 2022-10-31 ENCOUNTER — Ambulatory Visit: Payer: BC Managed Care – PPO | Attending: Internal Medicine | Admitting: Internal Medicine

## 2022-10-31 VITALS — BP 104/76 | HR 76 | Ht 64.0 in | Wt 96.0 lb

## 2022-10-31 DIAGNOSIS — F840 Autistic disorder: Secondary | ICD-10-CM | POA: Diagnosis not present

## 2022-10-31 DIAGNOSIS — I639 Cerebral infarction, unspecified: Secondary | ICD-10-CM | POA: Insufficient documentation

## 2022-10-31 DIAGNOSIS — R55 Syncope and collapse: Secondary | ICD-10-CM

## 2022-10-31 DIAGNOSIS — R001 Bradycardia, unspecified: Secondary | ICD-10-CM
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
## 2022-10-31 NOTE — Patient Instructions (Signed)
Medication Instructions:  Your physician recommends that you continue on your current medications as directed. Please refer to the Current Medication list given to you today.  Labwork: None ordered.  Testing/Procedures: None ordered.  Follow-Up:  Your physician wants you to follow-up in: one year with Dr. Lovena Le or APP.  You will receive a reminder letter in the mail two months in advance. If you don't receive a letter, please call our office to schedule the follow-up appointment.    Implantable Loop Recorder Placement, Care After This sheet gives you information about how to care for yourself after your procedure. Your health care provider may also give you more specific instructions. If you have problems or questions, contact your health care provider. What can I expect after the procedure? After the procedure, it is common to have: Soreness or discomfort near the incision. Some swelling or bruising near the incision.  Follow these instructions at home: Incision care  Monitor your cardiac device site for redness, swelling, and drainage. Call the device clinic at [phone] if you experience these symptoms or fever/chills.  Keep the large square bandage on your site for 24 hours and then you may remove it yourself. Keep the steri-strips underneath in place.   You may shower after 72 hours / 3 days from your procedure with the steri-strips in place. They will usually fall off on their own, or may be removed after 10 days. Pat dry.   Avoid lotions, ointments, or perfumes over your incision until it is well-healed.  Please do not submerge in water until your site is completely healed.   Your device is MRI compatible.   Remote monitoring is used to monitor your cardiac device from home. This monitoring is scheduled every month by our office. It allows [place] to keep an eye on the function of your device to ensure it is working properly.  If your wound site starts to bleed apply  pressure.    For help with the monitor please call Medtronic Monitor Support Specialist directly at [phone].    If you have any questions/concerns please call the device clinic at [phone].  Activity  Return to your normal activities.  General instructions Follow instructions from your health care provider about how to manage your implantable loop recorder and transmit the information. Learn how to activate a recording if this is necessary for your type of device. You may go through a metal detection gate, and you may let someone hold a metal detector over your chest. Show your ID card if needed. Do not have an MRI unless you check with your health care provider first. Take over-the-counter and prescription medicines only as told by your health care provider. Keep all follow-up visits as told by your health care provider. This is important. Contact a health care provider if: You have redness, swelling, or pain around your incision. You have a fever. You have pain that is not relieved by your pain medicine. You have triggered your device because of fainting (syncope) or because of a heartbeat that feels like it is racing, slow, fluttering, or skipping (palpitations). Get help right away if you have: Chest pain. Difficulty breathing. Summary After the procedure, it is common to have soreness or discomfort near the incision. Change your dressing as told by your health care provider. Follow instructions from your health care provider about how to manage your implantable loop recorder and transmit the information. Keep all follow-up visits as told by your health care provider. This is important.  This information is not intended to replace advice given to you by your health care provider. Make sure you discuss any questions you have with your health care provider. Document Released: 11/12/2015 Document Revised: 01/16/2018 Document Reviewed: 01/16/2018 Elsevier Patient Education   2020 [employer].

## 2022-11-03 DIAGNOSIS — F840 Autistic disorder: Secondary | ICD-10-CM | POA: Diagnosis not present

## 2022-11-03 DIAGNOSIS — F4312 Post-traumatic stress disorder, chronic: Secondary | ICD-10-CM | POA: Diagnosis not present

## 2022-11-04 DIAGNOSIS — F4312 Post-traumatic stress disorder, chronic: Secondary | ICD-10-CM | POA: Diagnosis not present

## 2022-11-04 DIAGNOSIS — F840 Autistic disorder: Secondary | ICD-10-CM | POA: Diagnosis not present

## 2022-11-08 ENCOUNTER — Other Ambulatory Visit (HOSPITAL_COMMUNITY): Payer: Self-pay

## 2022-11-08 ENCOUNTER — Telehealth: Payer: Self-pay | Admitting: Pharmacy Technician

## 2022-11-08 NOTE — Telephone Encounter (Signed)
Patient Advocate Encounter  Received notification from [facility] that prior authorization for OMEPRAZOLE '40MG'$  is required.   PA submitted on 11.25.23 Key [id number] Status is pending

## 2022-11-10 DIAGNOSIS — F4312 Post-traumatic stress disorder, chronic: Secondary | ICD-10-CM | POA: Diagnosis not present

## 2022-11-10 DIAGNOSIS — F840 Autistic disorder: Secondary | ICD-10-CM | POA: Diagnosis not present

## 2022-11-10 NOTE — Telephone Encounter (Signed)
Received a fax regarding Prior Authorization from [facility] for OMEPRAZOLE '40MG'$ d. Authorization has been DENIED because:

## 2022-11-10 NOTE — Telephone Encounter (Signed)
Please see note below and advise  

## 2022-11-11 DIAGNOSIS — F4312 Post-traumatic stress disorder, chronic: Secondary | ICD-10-CM | POA: Diagnosis not present

## 2022-11-11 DIAGNOSIS — F840 Autistic disorder: Secondary | ICD-10-CM | POA: Diagnosis not present

## 2022-11-12 DIAGNOSIS — F840 Autistic disorder: Secondary | ICD-10-CM | POA: Diagnosis not present

## 2022-11-12 DIAGNOSIS — Z713 Dietary counseling and surveillance: Secondary | ICD-10-CM | POA: Diagnosis not present

## 2022-11-12 DIAGNOSIS — F4312 Post-traumatic stress disorder, chronic: Secondary | ICD-10-CM | POA: Diagnosis not present

## 2022-11-12 NOTE — Telephone Encounter (Signed)
Left message for pt to call back  °

## 2022-11-13 DIAGNOSIS — F840 Autistic disorder: Secondary | ICD-10-CM | POA: Diagnosis not present

## 2022-11-13 DIAGNOSIS — F4312 Post-traumatic stress disorder, chronic: Secondary | ICD-10-CM | POA: Diagnosis not present

## 2022-11-13 NOTE — Telephone Encounter (Signed)
Pt made aware of Dr Payton Emerald recommendations: Pt verbalized understanding with all questions answered.

## 2022-11-17 DIAGNOSIS — F840 Autistic disorder: Secondary | ICD-10-CM | POA: Diagnosis not present

## 2022-11-17 DIAGNOSIS — F410 Panic disorder [episodic paroxysmal anxiety] without agoraphobia: Secondary | ICD-10-CM | POA: Diagnosis not present

## 2022-11-17 DIAGNOSIS — F4312 Post-traumatic stress disorder, chronic: Secondary | ICD-10-CM | POA: Diagnosis not present

## 2022-11-18 DIAGNOSIS — F840 Autistic disorder: Secondary | ICD-10-CM | POA: Diagnosis not present

## 2022-11-18 DIAGNOSIS — F4312 Post-traumatic stress disorder, chronic: Secondary | ICD-10-CM | POA: Diagnosis not present

## 2022-11-19 ENCOUNTER — Encounter: Payer: Self-pay | Admitting: Neurology

## 2022-11-19 ENCOUNTER — Ambulatory Visit: Payer: BC Managed Care – PPO | Admitting: Neurology

## 2022-11-19 DIAGNOSIS — G40019 Localization-related (focal) (partial) idiopathic epilepsy and epileptic syndromes with seizures of localized onset, intractable, without status epilepticus: Secondary | ICD-10-CM

## 2022-11-19 DIAGNOSIS — F840 Autistic disorder: Secondary | ICD-10-CM | POA: Diagnosis not present

## 2022-11-19 DIAGNOSIS — F4312 Post-traumatic stress disorder, chronic: Secondary | ICD-10-CM | POA: Diagnosis not present

## 2022-11-19 MED ORDER — LACOSAMIDE 100 MG PO TABS: ORAL_TABLET | ORAL | 5 refills | Status: DC

## 2022-11-19 MED ORDER — LEVETIRACETAM 500 MG PO TABS: ORAL_TABLET | ORAL | 3 refills | Status: DC

## 2022-11-19 MED ORDER — LORAZEPAM 0.5 MG PO TABS: ORAL_TABLET | ORAL | 5 refills | Status: DC

## 2022-11-19 MED ORDER — FYCOMPA 8 MG PO TABS: 8.0000 mg | ORAL_TABLET | Freq: Every day | ORAL | 3 refills | Status: DC
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
## 2022-11-19 NOTE — Patient Instructions (Signed)
Good to see you. Wishing you all the best!  Reduce Keppra '500mg'$ : take 1 tablet twice a day  2. Continue Vimpat '100mg'$ : take 1 tablet in AM, 2 tablets in PM  3. Continue Fycompa '8mg'$  every night  4. Refills sent for as needed lorazepam  5. Follow-up with Cardiology as scheduled  6. Follow-up in 6-8 months, call for any changes   Seizure Precautions: 1. If medication has been prescribed for you to prevent seizures, take it exactly as directed.  Do not stop taking the medicine without talking to your doctor first, even if you have not had a seizure in a long time.   2. Avoid activities in which a seizure would cause danger to yourself or to others.  Don't operate dangerous machinery, swim alone, or climb in high or dangerous places, such as on ladders, roofs, or girders.  Do not drive unless your doctor says you may.  3. If you have any warning that you may have a seizure, lay down in a safe place where you can't hurt yourself.    4.  No driving for 6 months from last seizure, as per [facility]   Please refer to the following link on the [place] website for more information: http://[email or web address]/[email or web address]/drivingu.cfm   5.  Maintain good sleep hygiene. Avoid alcohol.  6.  Contact your doctor if you have any problems that may be related to the medicine you are taking.  7.  Call 911 and bring the patient back to the ED if:        A.  The seizure lasts longer than 5 minutes.       B.  The patient doesn't awaken shortly after the seizure  C.  The patient has new problems such as difficulty seeing, speaking or moving  D.  The patient was injured during the seizure  E.  The patient has a temperature over 102 F (39C)  F.  The patient vomited and now is having trouble breathing

## 2022-11-20 DIAGNOSIS — F4312 Post-traumatic stress disorder, chronic: Secondary | ICD-10-CM | POA: Diagnosis not present

## 2022-11-20 DIAGNOSIS — F840 Autistic disorder: Secondary | ICD-10-CM | POA: Diagnosis not present

## 2022-11-24 DIAGNOSIS — F4312 Post-traumatic stress disorder, chronic: Secondary | ICD-10-CM | POA: Diagnosis not present

## 2022-11-24 DIAGNOSIS — F840 Autistic disorder: Secondary | ICD-10-CM | POA: Diagnosis not present

## 2022-11-25 ENCOUNTER — Ambulatory Visit: Payer: BC Managed Care – PPO | Admitting: Nurse Practitioner

## 2022-11-25 DIAGNOSIS — F840 Autistic disorder: Secondary | ICD-10-CM | POA: Diagnosis not present

## 2022-11-25 DIAGNOSIS — F4312 Post-traumatic stress disorder, chronic: Secondary | ICD-10-CM | POA: Diagnosis not present

## 2022-11-27 DIAGNOSIS — F4312 Post-traumatic stress disorder, chronic: Secondary | ICD-10-CM | POA: Diagnosis not present

## 2022-11-27 DIAGNOSIS — F840 Autistic disorder: Secondary | ICD-10-CM | POA: Diagnosis not present

## 2022-11-27 DIAGNOSIS — Z713 Dietary counseling and surveillance: Secondary | ICD-10-CM | POA: Diagnosis not present

## 2022-12-01 DIAGNOSIS — F840 Autistic disorder: Secondary | ICD-10-CM | POA: Diagnosis not present

## 2022-12-01 DIAGNOSIS — F4312 Post-traumatic stress disorder, chronic: Secondary | ICD-10-CM | POA: Diagnosis not present

## 2022-12-02 ENCOUNTER — Non-Acute Institutional Stay (HOSPITAL_COMMUNITY)
Admission: RE | Admit: 2022-12-02 | Discharge: 2022-12-02 | Disposition: A | Discharge status: Home / Self Care | Payer: BC Managed Care – PPO | Source: Ambulatory Visit | Attending: Internal Medicine | Admitting: Internal Medicine

## 2022-12-02 ENCOUNTER — Ambulatory Visit: Payer: BC Managed Care – PPO | Admitting: Nurse Practitioner

## 2022-12-02 DIAGNOSIS — F840 Autistic disorder: Secondary | ICD-10-CM | POA: Diagnosis not present

## 2022-12-02 DIAGNOSIS — Z452 Encounter for adjustment and management of vascular access device: Secondary | ICD-10-CM | POA: Diagnosis not present

## 2022-12-02 DIAGNOSIS — F4312 Post-traumatic stress disorder, chronic: Secondary | ICD-10-CM | POA: Diagnosis not present

## 2022-12-02 MED ORDER — HEPARIN SOD (PORK) LOCK FLUSH 100 UNIT/ML IV SOLN
500.0000 [IU] | INTRAVENOUS | Status: AC | PRN
  Administered 2022-12-02: 500 [IU]

## 2022-12-02 MED ORDER — SODIUM CHLORIDE 0.9% FLUSH
10.0000 mL | INTRAVENOUS | Status: AC | PRN
  Administered 2022-12-02: 10 mL

## 2022-12-02 NOTE — Progress Notes (Deleted)
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 Past cholecystectomy, appendectomy, hysterectomy and BSO. She underwent an EGD 10/20/2022 by Dr. Tarri Glenn due to having N/V and weight loss which showed esophagitis and gastritis. She was prescribed Omeprazole 40 mg BID for at least 8 weeks. Office follow-up in 6-8 weeks      Latest Ref Rng & Units 05/22/2022    1:39 PM 02/21/2022   10:21 AM 12/10/2021   12:08 PM  CBC  WBC 4.0 - 10.5 K/uL 6.6  8.0  6.2   Hemoglobin 12.0 - 15.0 g/dL 14.1  14.0  13.6   Hematocrit 36.0 - 46.0 % 42.1  41.5  40.5   Platelets 150.0 - 400.0 K/uL 312.0  351  354         Latest Ref Rng & Units 05/22/2022    1:39 PM 02/21/2022   10:21 AM 12/10/2021   12:08 PM  CMP  Glucose 70 - 99 mg/dL 74  92  95   BUN 6 - 23 mg/dL _0 Creatinine 0.40 - 1.20 mg/dL 0.83  0.92  0.96   Sodium 135 - 145 mEq/L 139  136  136   Potassium 3.5 - 5.1 mEq/L 3.6  4.2  3.5   Chloride 96 - 112 mEq/L 102  101  105   CO2 19 - 32 mEq/L _1 Calcium 8.4 - 10.5 mg/dL 9.8  9.5  9.5   Total Protein 6.0 - 8.3 g/dL 7.3     Total Bilirubin 0.2 - 1.2 mg/dL 0.4     Alkaline Phos 39 - 117 U/L 58     AST 0 - 37 U/L 20     ALT 0 - 35 U/L 15       EGD 10/20/2022: - Normal esophagus. - Erythematous mucosa in the gastric body. Biopsied. - Normal examined duodenum. Biopsied. - The examination was otherwise normal. - Biopsies were taken with a cold forceps for evaluation of eosinophilic esophagitis. Biopsies show esophagitis and gastritis. Given these results, I recommend omeprazole 40 mg BID for at least 8 weeks. Office follow-up with me or an APP in 6-8 weeks   A. DUODENUM, BIOPSY:  - Duodenal  mucosa without diagnostic abnormality.  - Negative for celiac change.   B. STOMACH, ANTRUM, BODY, FUNDUS, BIOPSY:  - Antral and oxyntic mucosae with mild chronic nonspecific gastritis and  reactive changes.  - Immunohistochemical stain for Helicobacter organisms is negative.   C. ESOPHAGUS, DISTAL, BIOPSY:  - Squamous mucosa with reactive changes consistent with reflux.  - Negative for eosinophils.  - Negative for intestinal metaplasia or dysplasia.   D. ESOPHAGUS, PROXIMAL, BIOPSY:  - Squamous mucosa with mild reactive changes suggestive of reflux.  - Negative for eosinophils.     Current Medications, Allergies, Past Medical History, Past Surgical History, Family History and Social History were reviewed in [employer] record.   Review of Systems:   Constitutional: Negative for fever, sweats, chills or weight loss.  Respiratory: Negative for shortness of breath.   Cardiovascular: Negative for chest pain, palpitations and leg swelling.  Gastrointestinal: See HPI.  Musculoskeletal: Negative for back pain or muscle aches.  Neurological: Negative for  dizziness, headaches or paresthesias.    Physical Exam: LMP 02/11/2021  General: Well developed, w   ***female in no acute distress. Head: Normocephalic and atraumatic. Eyes: No scleral icterus. Conjunctiva pink . Ears: Normal auditory acuity. Mouth: Dentition intact. No ulcers or lesions.  Lungs: Clear throughout to auscultation. Heart: Regular rate and rhythm, no murmur. Abdomen: Soft, nontender and nondistended. No masses or hepatomegaly. Normal bowel sounds x 4 quadrants.  Rectal: *** Musculoskeletal: Symmetrical with no gross deformities. Extremities: No edema. Neurological: Alert oriented x 4. No focal deficits.  Psychological: Alert and cooperative. Normal mood and affect  Assessment and Recommendations: ***

## 2022-12-02 NOTE — Progress Notes (Addendum)
PATIENT CARE CENTER NOTE:  Provider:  Dorothyann Peng NP     Procedure: Port-a-cath flush     Note: Patient's PAC accessed using sterile technique, flushed with 10 cc 0.9% sodium chloride and heparin per verbal orders given by provider on 05/21/22, PAC to be accessed and flush 1x/month x 1 year. Blood return noted. PAC de-accessed and site covered with band aid. Patient tolerated well. Patient declined AVS, instructed pt to schedule monthly appointment at front desk prior to leaving, verbalized understanding.  Alert, oriented and ambulatory at discharge accompanied by support person.

## 2022-12-03 DIAGNOSIS — F840 Autistic disorder: Secondary | ICD-10-CM | POA: Diagnosis not present

## 2022-12-03 DIAGNOSIS — F4312 Post-traumatic stress disorder, chronic: Secondary | ICD-10-CM | POA: Diagnosis not present

## 2022-12-09 ENCOUNTER — Ambulatory Visit (INDEPENDENT_AMBULATORY_CARE_PROVIDER_SITE_OTHER): Payer: BC Managed Care – PPO

## 2022-12-09 DIAGNOSIS — R55 Syncope and collapse: Secondary | ICD-10-CM | POA: Diagnosis not present

## 2022-12-09 LAB — CUP PACEART REMOTE DEVICE CHECK
Date Time Interrogation Session: 20231223080930
Implantable Pulse Generator Implant Date: 20231117

## 2022-12-11 DIAGNOSIS — F840 Autistic disorder: Secondary | ICD-10-CM | POA: Diagnosis not present

## 2022-12-11 DIAGNOSIS — F4312 Post-traumatic stress disorder, chronic: Secondary | ICD-10-CM | POA: Diagnosis not present

## 2022-12-12 DIAGNOSIS — F840 Autistic disorder: Secondary | ICD-10-CM | POA: Diagnosis not present

## 2022-12-16 DIAGNOSIS — F840 Autistic disorder: Secondary | ICD-10-CM | POA: Diagnosis not present

## 2022-12-17 DIAGNOSIS — Z713 Dietary counseling and surveillance: Secondary | ICD-10-CM | POA: Diagnosis not present

## 2022-12-18 ENCOUNTER — Ambulatory Visit: Payer: BC Managed Care – PPO | Admitting: Adult Health

## 2022-12-18 DIAGNOSIS — F410 Panic disorder [episodic paroxysmal anxiety] without agoraphobia: Secondary | ICD-10-CM | POA: Diagnosis not present

## 2022-12-18 DIAGNOSIS — F840 Autistic disorder: Secondary | ICD-10-CM | POA: Diagnosis not present

## 2022-12-18 DIAGNOSIS — F4312 Post-traumatic stress disorder, chronic: Secondary | ICD-10-CM | POA: Diagnosis not present

## 2022-12-19 DIAGNOSIS — F840 Autistic disorder: Secondary | ICD-10-CM | POA: Diagnosis not present

## 2022-12-22 DIAGNOSIS — F840 Autistic disorder: Secondary | ICD-10-CM | POA: Diagnosis not present

## 2022-12-23 ENCOUNTER — Ambulatory Visit: Payer: BC Managed Care – PPO | Admitting: Adult Health

## 2022-12-23 DIAGNOSIS — F840 Autistic disorder: Secondary | ICD-10-CM | POA: Diagnosis not present

## 2022-12-31 ENCOUNTER — Telehealth: Payer: Self-pay | Admitting: Adult Health

## 2022-12-31 NOTE — Progress Notes (Signed)
Carelink Summary Report / Loop Recorder

## 2022-12-31 NOTE — Telephone Encounter (Signed)
Patient scheduled an appointment for her and her therapist to meet with [facility] via video visit on Feb. 1st. Does [facility] meet with other providers during patient visit?     Please advise

## 2023-01-06 DIAGNOSIS — F4312 Post-traumatic stress disorder, chronic: Secondary | ICD-10-CM | POA: Diagnosis not present

## 2023-01-09 DIAGNOSIS — F4312 Post-traumatic stress disorder, chronic: Secondary | ICD-10-CM | POA: Diagnosis not present

## 2023-01-10 DIAGNOSIS — F4312 Post-traumatic stress disorder, chronic: Secondary | ICD-10-CM | POA: Diagnosis not present

## 2023-01-12 ENCOUNTER — Ambulatory Visit: Payer: BC Managed Care – PPO | Attending: Internal Medicine

## 2023-01-12 DIAGNOSIS — R55 Syncope and collapse: Secondary | ICD-10-CM

## 2023-01-12 DIAGNOSIS — F4312 Post-traumatic stress disorder, chronic: Secondary | ICD-10-CM | POA: Diagnosis not present

## 2023-01-13 DIAGNOSIS — F4312 Post-traumatic stress disorder, chronic: Secondary | ICD-10-CM | POA: Diagnosis not present

## 2023-01-13 LAB — CUP PACEART REMOTE DEVICE CHECK
Date Time Interrogation Session: 20240128230948
Implantable Pulse Generator Implant Date: 20231117

## 2023-01-14 ENCOUNTER — Encounter: Payer: Self-pay | Admitting: Adult Health

## 2023-01-14 DIAGNOSIS — M25512 Pain in left shoulder: Secondary | ICD-10-CM | POA: Diagnosis not present

## 2023-01-15 ENCOUNTER — Telehealth: Payer: BC Managed Care – PPO | Admitting: Adult Health

## 2023-01-15 DIAGNOSIS — F4312 Post-traumatic stress disorder, chronic: Secondary | ICD-10-CM | POA: Diagnosis not present

## 2023-01-15 NOTE — Telephone Encounter (Signed)
Pt stated she will reschedule appt.

## 2023-01-16 ENCOUNTER — Encounter: Payer: Self-pay | Admitting: Adult Health

## 2023-01-16 ENCOUNTER — Other Ambulatory Visit (HOSPITAL_COMMUNITY): Payer: Self-pay | Admitting: Orthopaedic Surgery

## 2023-01-16 DIAGNOSIS — M25512 Pain in left shoulder: Secondary | ICD-10-CM

## 2023-01-19 DIAGNOSIS — F4312 Post-traumatic stress disorder, chronic: Secondary | ICD-10-CM | POA: Diagnosis not present

## 2023-01-20 DIAGNOSIS — F4312 Post-traumatic stress disorder, chronic: Secondary | ICD-10-CM | POA: Diagnosis not present

## 2023-01-22 DIAGNOSIS — F4312 Post-traumatic stress disorder, chronic: Secondary | ICD-10-CM | POA: Diagnosis not present

## 2023-01-24 DIAGNOSIS — F4312 Post-traumatic stress disorder, chronic: Secondary | ICD-10-CM | POA: Diagnosis not present

## 2023-01-26 DIAGNOSIS — F4312 Post-traumatic stress disorder, chronic: Secondary | ICD-10-CM | POA: Diagnosis not present

## 2023-01-26 DIAGNOSIS — S86812A Strain of other muscle(s) and tendon(s) at lower leg level, left leg, initial encounter: Secondary | ICD-10-CM | POA: Diagnosis not present

## 2023-01-27 DIAGNOSIS — F4312 Post-traumatic stress disorder, chronic: Secondary | ICD-10-CM | POA: Diagnosis not present

## 2023-01-30 DIAGNOSIS — F4312 Post-traumatic stress disorder, chronic: Secondary | ICD-10-CM | POA: Diagnosis not present

## 2023-02-02 DIAGNOSIS — F4312 Post-traumatic stress disorder, chronic: Secondary | ICD-10-CM | POA: Diagnosis not present

## 2023-02-03 ENCOUNTER — Telehealth (INDEPENDENT_AMBULATORY_CARE_PROVIDER_SITE_OTHER): Payer: BC Managed Care – PPO | Admitting: Adult Health

## 2023-02-03 DIAGNOSIS — D849 Immunodeficiency, unspecified: Secondary | ICD-10-CM | POA: Diagnosis not present

## 2023-02-03 DIAGNOSIS — J4520 Mild intermittent asthma, uncomplicated: Secondary | ICD-10-CM

## 2023-02-03 DIAGNOSIS — R634 Abnormal weight loss: Secondary | ICD-10-CM

## 2023-02-03 DIAGNOSIS — R112 Nausea with vomiting, unspecified: Secondary | ICD-10-CM

## 2023-02-03 DIAGNOSIS — F4312 Post-traumatic stress disorder, chronic: Secondary | ICD-10-CM | POA: Diagnosis not present

## 2023-02-03 DIAGNOSIS — R197 Diarrhea, unspecified: Secondary | ICD-10-CM

## 2023-02-03 MED ORDER — FLUTICASONE FUROATE-VILANTEROL 100-25 MCG/ACT IN AEPB: 1.0000 | INHALATION_SPRAY | Freq: Every day | RESPIRATORY_TRACT | 11 refills | Status: AC

## 2023-02-03 NOTE — Progress Notes (Signed)
Virtual Visit via Video Note  I connected with Elouise Gallegos on 02/03/23 at  4:00 PM EST by a video enabled telemedicine application and verified that I am speaking with the correct person using two identifiers.  Location patient: home Location provider:work or home office Persons participating in the virtual visit: patient, provider  I discussed the limitations of evaluation and management by telemedicine and the availability of in person appointments. The patient expressed understanding and agreed to proceed.   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  Due to various medical issues and decreased immune system it would not be good for her to travel back and forth between the [facility] during COVID/flu/RSV season.  She is also having intermittent episodes of worsening asthma exacerbations.  She is using her Singulair nightly as well as using her albuterol as needed but at times these do not seem to help and she feels as though she gets winded and tight in the chest.  Furthermore, she would like a referral to a new gastroenterologist.  She was seen GI through [facility] [place] for chronic constipation, nausea/vomiting, and weight loss.  She does not feel as though she had a good relationship with her gastroenterologist and would like a referral elsewhere.  She continues to have weight loss, nausea and vomiting but her chronic constipation has turned into diarrhea.   ROS: See pertinent positives and negatives per HPI.  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)    complex partial   Shoulder dislocation 09/2018   Left   Shoulder subluxation, left    Syncope    Tremor, unspecified    bilateral arms   Wears glasses    Wears hearing aid    bilateral    Past Surgical History:  Procedure Laterality Date   ABDOMINAL HYSTERECTOMY     APPENDECTOMY     BIOPSY  10/20/2022   Procedure: BIOPSY;  Surgeon: Thornton Park, MD;  [facility];  Service: Gastroenterology;;   CHOLECYSTECTOMY N/A 10/05/2018   Procedure: LAPAROSCOPIC CHOLECYSTECTOMY WITH INTRAOPERATIVE CHOLANGIOGRAM ERAS PATHWAY;  Surgeon: Alphonsa Overall, MD;  [facility];  Service: General;  Laterality: N/A;   CYSTOSCOPY N/A 03/12/2021   Procedure: CYSTOSCOPY;  Surgeon: Sherlyn Hay, DO;  Location: [place];  Service: Gynecology;  Laterality: N/A;   ESOPHAGOGASTRODUODENOSCOPY (EGD) WITH PROPOFOL N/A 10/20/2022   Procedure: ESOPHAGOGASTRODUODENOSCOPY (EGD) WITH PROPOFOL;  Surgeon: Thornton Park, MD;  [facility];  Service: Gastroenterology;  Laterality: N/A;   LAPAROSCOPIC APPENDECTOMY  11/26/2011   Procedure: APPENDECTOMY LAPAROSCOPIC;  Surgeon: Gayland Curry, MD;  Location: [place];  Service: General;  Laterality: N/A;   left shoulder manipulation      in er multilple times last done july 2021   MASTECTOMY     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   NIPPLE SPARING MASTECTOMY Bilateral 02/25/2022   Procedure: BILATERAL NIPPLE-SPARING MASTECTOMY;  Surgeon: Stark Klein, MD;  Location: [place];  Service: General;  Laterality: Bilateral;   PORT-A-CATH REMOVAL N/A 12/10/2021   Procedure: REMOVAL PORT-A-CATH AND REPLACEMENT;  Surgeon: [name], MD;  Location: [place];  Service: General;  Laterality: N/A;   PORTACATH PLACEMENT Right 08/28/2017   Procedure: POWER PORT PLACEMENT;  Surgeon: Alphonsa Overall, MD;  Location: [place];  Service: General;  Laterality: Right;   PORTACATH PLACEMENT N/A 12/04/2017   Procedure: INSERTION PORT-A-CATH;  Surgeon: Alphonsa Overall, MD;  Location: [place];  Service: General;  Laterality: N/A;   RADIOLOGY WITH ANESTHESIA Left 10/25/2019   Procedure: MRI  LEFT SHOLDER WITHOUT CONTRAST;  Surgeon: Radiologist, Medication, MD;  Location: [place];  Service: Radiology;  Laterality: Left;   RADIOLOGY WITH ANESTHESIA Bilateral 11/19/2021   Procedure: MRI WITH ANESTHESIA OF BILATERAL BREASTS WITH AND WITHOUT CONTRAST;  Surgeon: Radiologist, Medication, MD;  Location: [place];  Service: Radiology;  Laterality: Bilateral;   REPAIR ANKLE LIGAMENT Left    x 3   TOTAL LAPAROSCOPIC HYSTERECTOMY WITH SALPINGECTOMY Bilateral 03/12/2021   Procedure: TOTAL LAPAROSCOPIC HYSTERECTOMY WITH SALPINGECTOMY;  Surgeon: Sherlyn Hay, DO;  Location: [place];  Service: Gynecology;  Laterality: Bilateral;    Family History  Problem Relation Age of Onset   Polymyalgia rheumatica Mother    Heart disease Mother    Sudden Cardiac Death Father        long QT syndrome    Heart disease Maternal Grandmother    Breast cancer Maternal Grandmother    Stomach cancer Maternal Grandmother    Heart disease Maternal Grandfather    Stroke Maternal Grandfather    Diabetes Maternal Grandfather    COPD  Maternal Grandfather    Kidney failure Maternal Grandfather    Colon cancer Paternal Grandmother 22   Heart disease Paternal Grandmother    Heart disease Paternal Grandfather    Migraines Paternal Grandfather    Esophageal cancer Cousin    Pancreatic cancer Neg Hx        Current Outpatient Medications:    acetaminophen (TYLENOL) 325 MG tablet, Take 2 tablets (650 mg total) by mouth every 6 (six) hours as needed (pain)., Disp: , Rfl:    albuterol (VENTOLIN HFA) 108 (90 Base) MCG/ACT inhaler, Inhale 2 puffs into the lungs every 6 (six) hours as needed for wheezing or shortness of breath., Disp: 6.7 g, Rfl: 3   buPROPion (WELLBUTRIN XL) 300 MG 24 hr tablet, Take 300 mg by mouth 2 (two) times daily., Disp: , Rfl:  busPIRone (BUSPAR) 15 MG tablet, Take 15 mg by mouth 3 (three) times daily., Disp: , Rfl:    clindamycin (CLEOCIN T) 1 % lotion, Apply 1 application  topically daily. Apply to affected areas of the face 2 times a day, Disp: , Rfl: 1   Continuous Blood Gluc Sensor (FREESTYLE LIBRE 2 SENSOR) MISC, USE WITH LIBRE APP, Disp: 6 each, Rfl: 2   Continuous Blood Gluc Transmit (DEXCOM G6 TRANSMITTER) MISC, Use with dexcom receiver and sensor, Disp: 1 each, Rfl: 3   diphenhydrAMINE (BENADRYL) 25 MG tablet, Take 50 mg by mouth at bedtime., Disp: , Rfl:    fluticasone furoate-vilanterol (BREO ELLIPTA) 100-25 MCG/ACT AEPB, Inhale 1 puff into the lungs daily., Disp: 1 each, Rfl: 11   Glucagon, rDNA, (GLUCAGON EMERGENCY) 1 MG KIT, Use PRN for hypoglycemia, Disp: 1 kit, Rfl: 3   glucose monitoring kit (FREESTYLE) monitoring kit, 1 each by Does not apply route as needed for other. Free style libre on right arm, Disp: , Rfl:    Lacosamide (VIMPAT) 100 MG TABS, Take 1 tablet in AM, 2 tablets in PM, Disp: 90 tablet, Rfl: 5   levETIRAcetam (KEPPRA) 500 MG tablet, Take 1 tablet twice a day, Disp: 180 tablet, Rfl: 3   LORazepam (ATIVAN) 0.5 MG tablet, Take 1 tablet as needed for seizure. Do not take more  than 2-3 a week., Disp: 10 tablet, Rfl: 5   montelukast (SINGULAIR) 10 MG tablet, TAKE 1 TABLET BY MOUTH EVERYDAY AT BEDTIME, Disp: 90 tablet, Rfl: 3   MOTEGRITY 2 MG TABS, TAKE 1 TABLET BY MOUTH EVERY DAY, Disp: 30 tablet, Rfl: 5   perampanel (FYCOMPA) 8 MG tablet, Take 1 tablet (8 mg total) by mouth at bedtime. Take 1 tablet every night, Disp: 90 tablet, Rfl: 3   topiramate (TOPAMAX) 25 MG tablet, Take 50 mg by mouth at bedtime., Disp: , Rfl:    omeprazole (PRILOSEC) 40 MG capsule, Take 1 capsule (40 mg total) by mouth 2 (two) times daily., Disp: 180 capsule, Rfl: 0  Current Facility-Administered Medications:    alteplase (CATHFLO ACTIVASE) injection 2 mg, 2 mg, Intracatheter, Once, Jermaine Neuharth, [name], NP   alteplase (CATHFLO ACTIVASE) injection 2 mg, 2 mg, Intracatheter, Once PRN, Ciarrah Rae, [name], NP  EXAM:  VITALS per patient if applicable:  GENERAL: alert, oriented, appears well and in no acute distress  HEENT: atraumatic, conjunttiva clear, no obvious abnormalities on inspection of external nose and ears  NECK: normal movements of the head and neck  LUNGS: on inspection no signs of respiratory distress, breathing rate appears normal, no obvious gross SOB, gasping or wheezing  CV: no obvious cyanosis  MS: moves all visible extremities without noticeable abnormality  PSYCH/NEURO: pleasant and cooperative, no obvious depression or anxiety, speech and thought processing grossly intact  ASSESSMENT AND PLAN:  Discussed the following assessment and plan:  1. Immune deficiency disorder [facility]) - letter written and placed in her Mychart  2. Mild intermittent asthma without complication - Will add breo inhaler  - fluticasone furoate-vilanterol (BREO ELLIPTA) 100-25 MCG/ACT AEPB; Inhale 1 puff into the lungs daily.  Dispense: 1 each; Refill: 11  3. Unintentional weight loss  - Ambulatory referral to Gastroenterology  4. Nausea and vomiting, unspecified vomiting type  - Ambulatory  referral to Gastroenterology  5. Diarrhea, unspecified type  - Ambulatory referral to Gastroenterology  I discussed the assessment and treatment plan with the patient. The patient was provided an opportunity to ask questions and all were answered. The patient agreed  with the plan and demonstrated an understanding of the instructions.   The patient was advised to call back or seek an in-person evaluation if the symptoms worsen or if the condition fails to improve as anticipated.   Dorothyann Peng, NP

## 2023-02-04 ENCOUNTER — Non-Acute Institutional Stay (HOSPITAL_COMMUNITY)
Admission: RE | Admit: 2023-02-04 | Discharge: 2023-02-04 | Disposition: A | Discharge status: Home / Self Care | Payer: BC Managed Care – PPO | Source: Ambulatory Visit | Attending: Internal Medicine | Admitting: Internal Medicine

## 2023-02-04 ENCOUNTER — Ambulatory Visit: Payer: BC Managed Care – PPO | Admitting: Adult Health

## 2023-02-04 DIAGNOSIS — Z95828 Presence of other vascular implants and grafts: Secondary | ICD-10-CM | POA: Insufficient documentation

## 2023-02-04 DIAGNOSIS — F4312 Post-traumatic stress disorder, chronic: Secondary | ICD-10-CM | POA: Diagnosis not present

## 2023-02-04 MED ORDER — HEPARIN SOD (PORK) LOCK FLUSH 100 UNIT/ML IV SOLN
500.0000 [IU] | INTRAVENOUS | Status: AC | PRN
  Administered 2023-02-04: 500 [IU]

## 2023-02-04 MED ORDER — SODIUM CHLORIDE 0.9% FLUSH
10.0000 mL | INTRAVENOUS | Status: AC | PRN
  Administered 2023-02-04: 10 mL

## 2023-02-04 NOTE — Progress Notes (Signed)
PATIENT CARE CENTER NOTE  Provider: Dr. Dorothyann Peng   Procedure: Port-A-cath Flush  Note:  Patient's PAC accessed using sterile technique and flushed with 0.9% Sodium Chloride and Heparin. Patient tolerated procedure well. Verbal order given on 05/21/22 by Dr. Dorothyann Peng for port flush x 1 year. Patient alert, oriented and ambulatory at discharge. Discharged home with support person.

## 2023-02-05 DIAGNOSIS — F4312 Post-traumatic stress disorder, chronic: Secondary | ICD-10-CM | POA: Diagnosis not present

## 2023-02-09 ENCOUNTER — Telehealth: Payer: Self-pay

## 2023-02-09 DIAGNOSIS — F4312 Post-traumatic stress disorder, chronic: Secondary | ICD-10-CM | POA: Diagnosis not present

## 2023-02-09 NOTE — Telephone Encounter (Signed)
Patient called in, she got back from the Orthodontist and she is in need of braces but her orthodontist wanted to know if Dr.Aquino would be comfortable with the patient getting regular braces or if not they can do invisalign but if patient were to have a seizure there is a risk of it coming out and becoming choking hazard.

## 2023-02-10 DIAGNOSIS — F4312 Post-traumatic stress disorder, chronic: Secondary | ICD-10-CM | POA: Diagnosis not present

## 2023-02-12 ENCOUNTER — Ambulatory Visit: Payer: BC Managed Care – PPO | Admitting: Adult Health

## 2023-02-12 DIAGNOSIS — F4312 Post-traumatic stress disorder, chronic: Secondary | ICD-10-CM | POA: Diagnosis not present

## 2023-02-13 ENCOUNTER — Encounter: Payer: Self-pay | Admitting: Adult Health

## 2023-02-13 ENCOUNTER — Ambulatory Visit (INDEPENDENT_AMBULATORY_CARE_PROVIDER_SITE_OTHER): Payer: BC Managed Care – PPO | Admitting: Adult Health

## 2023-02-13 VITALS — BP 100/60 | HR 64 | Ht 64.0 in | Wt 97.8 lb

## 2023-02-13 DIAGNOSIS — F4312 Post-traumatic stress disorder, chronic: Secondary | ICD-10-CM | POA: Diagnosis not present

## 2023-02-13 DIAGNOSIS — Z01818 Encounter for other preprocedural examination: Secondary | ICD-10-CM

## 2023-02-13 DIAGNOSIS — F410 Panic disorder [episodic paroxysmal anxiety] without agoraphobia: Secondary | ICD-10-CM | POA: Diagnosis not present
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
## 2023-02-14 LAB — BASIC METABOLIC PANEL
BUN: 15 mg/dL (ref 7–25)
CO2: 22 mmol/L (ref 20–32)
Calcium: 9 mg/dL (ref 8.6–10.2)
Chloride: 106 mmol/L (ref 98–110)
Creat: 0.71 mg/dL (ref 0.50–0.97)
Glucose, Bld: 69 mg/dL (ref 65–99)
Potassium: 4.2 mmol/L (ref 3.5–5.3)
Sodium: 138 mmol/L (ref 135–146)

## 2023-02-14 LAB — CBC WITH DIFFERENTIAL/PLATELET
Absolute Monocytes: 755 cells/uL (ref 200–950)
Basophils Absolute: 39 cells/uL (ref 0–200)
Basophils Relative: 0.5 %
Eosinophils Absolute: 300 cells/uL (ref 15–500)
Eosinophils Relative: 3.9 %
HCT: 39.2 % (ref 35.0–45.0)
Hemoglobin: 13.1 g/dL (ref 11.7–15.5)
Lymphs Abs: 2710 cells/uL (ref 850–3900)
MCH: 29.4 pg (ref 27.0–33.0)
MCHC: 33.4 g/dL (ref 32.0–36.0)
MCV: 87.9 fL (ref 80.0–100.0)
MPV: 10.5 fL (ref 7.5–12.5)
Monocytes Relative: 9.8 %
Neutro Abs: 3896 cells/uL (ref 1500–7800)
Neutrophils Relative %: 50.6 %
Platelets: 360 10*3/uL (ref 140–400)
RBC: 4.46 10*6/uL (ref 3.80–5.10)
RDW: 12.7 % (ref 11.0–15.0)
Total Lymphocyte: 35.2 %
WBC: 7.7 10*3/uL (ref 3.8–10.8)

## 2023-02-16 ENCOUNTER — Ambulatory Visit (INDEPENDENT_AMBULATORY_CARE_PROVIDER_SITE_OTHER): Payer: BC Managed Care – PPO

## 2023-02-16 DIAGNOSIS — F4312 Post-traumatic stress disorder, chronic: Secondary | ICD-10-CM | POA: Diagnosis not present

## 2023-02-16 DIAGNOSIS — Z713 Dietary counseling and surveillance: Secondary | ICD-10-CM | POA: Diagnosis not present

## 2023-02-16 DIAGNOSIS — R55 Syncope and collapse: Secondary | ICD-10-CM | POA: Diagnosis not present

## 2023-02-16 LAB — CUP PACEART REMOTE DEVICE CHECK
Date Time Interrogation Session: 20240303230301
Implantable Pulse Generator Implant Date: 20231117

## 2023-02-17 DIAGNOSIS — F4312 Post-traumatic stress disorder, chronic: Secondary | ICD-10-CM | POA: Diagnosis not present

## 2023-02-19 DIAGNOSIS — F4312 Post-traumatic stress disorder, chronic: Secondary | ICD-10-CM | POA: Diagnosis not present

## 2023-02-20 DIAGNOSIS — R197 Diarrhea, unspecified: Secondary | ICD-10-CM | POA: Diagnosis not present

## 2023-02-20 DIAGNOSIS — R112 Nausea with vomiting, unspecified: Secondary | ICD-10-CM | POA: Diagnosis not present

## 2023-02-23 ENCOUNTER — Encounter (HOSPITAL_COMMUNITY): Payer: Self-pay | Admitting: *Deleted

## 2023-02-23 ENCOUNTER — Other Ambulatory Visit: Payer: Self-pay

## 2023-02-23 DIAGNOSIS — Z713 Dietary counseling and surveillance: Secondary | ICD-10-CM | POA: Diagnosis not present

## 2023-02-23 NOTE — Progress Notes (Addendum)
[name] denies chest pain or shortness of breath. Patient denies having any s/s of Covid in her household, also denies any known exposure to Covid. [name] denies any s/s of upper or lower respiratory in the past 8 weeks.  [name]'s PCP is [employer], PA-C.  Patient has seen Dr. Lovena Le,  he placed a loop recorder- monitoring for long QT.  [name] has history of hypoglycemia, patient wears a Dexcom G6 glucose reader, patient is removing reader before she lives to come to the hospital when glucose is less that 50, patient becomes symptomatic.  [name] has tablets and Glugon injection for hypoglycemia.   [name] reports that her porta catheter is always used for surgery.  Patient also reports that someone always goes to PACU, to be with her when she wakes.

## 2023-02-23 NOTE — Anesthesia Preprocedure Evaluation (Addendum)
Anesthesia Evaluation  Patient identified by MRN, date of birth, ID band Patient awake    Reviewed: Allergy & Precautions, NPO status , Patient's Chart, lab work & pertinent test results  History of Anesthesia Complications (+) PONV and history of anesthetic complications (epilepsy poorly controlled- has seizures after most anesthetics)  Airway Mallampati: III  TM Distance: >3 FB Neck ROM: Full    Dental  (+) Teeth Intact, Dental Advisory Given   Pulmonary asthma    Pulmonary exam normal breath sounds clear to auscultation       Cardiovascular negative cardio ROS Normal cardiovascular exam Rhythm:Regular Rate:Normal  TTE 2019 - Left ventricle: The cavity size was normal. Systolic function was    normal. The estimated ejection fraction was in the range of 60%    to 65%. Wall motion was normal; there were no regional wall    motion abnormalities. Left ventricular diastolic function    parameters were normal.  - Aortic valve: Transvalvular velocity was within the normal range.    There was no stenosis. There was no regurgitation.  - Mitral valve: Transvalvular velocity was within the normal range.    There was no evidence for stenosis. There was no regurgitation.  - Right ventricle: The cavity size was normal. Wall thickness was    normal. Systolic function was normal.  - Pulmonary arteries: Systolic pressure was within the normal    range. PA peak pressure: 20 mm Hg (S).     Neuro/Psych  Headaches, Seizures -, Poorly Controlled,  PSYCHIATRIC DISORDERS Anxiety     Multiple antiepileptics - no missed doses CVA    GI/Hepatic Neg liver ROS,GERD  Controlled,,Weight loss, N/V   Endo/Other  negative endocrine ROS    Renal/GU negative Renal ROS  negative genitourinary   Musculoskeletal negative musculoskeletal ROS (+)    Abdominal   Peds  Hematology negative hematology ROS (+)   Anesthesia Other Findings    Reproductive/Obstetrics negative OB ROS                             Anesthesia Physical Anesthesia Plan  ASA: 3  Anesthesia Plan: General   Post-op Pain Management: Precedex and Minimal or no pain anticipated   Induction: Intravenous  PONV Risk Score and Plan: 4 or greater and Midazolam, Ondansetron and Dexamethasone  Airway Management Planned: LMA  Additional Equipment: None  Intra-op Plan:   Post-operative Plan: Extubation in OR  Informed Consent: I have reviewed the patients History and Physical, chart, labs and discussed the procedure including the risks, benefits and alternatives for the proposed anesthesia with the patient or authorized representative who has indicated his/her understanding and acceptance.     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 Treated with versed and ativan. Longest seizure lasted ~1 min. No respiratory compromise. Vital signs remained stable. After final seizure, she was monitored in PACU for ~1 hour. She was discharged home in stable condition."  Has therapist with her at bedside, would like to have her with her as much as possible perioperatively. )       Anesthesia Quick Evaluation

## 2023-02-24 ENCOUNTER — Ambulatory Visit (HOSPITAL_COMMUNITY)
Admission: RE | Admit: 2023-02-24 | Discharge: 2023-02-24 | Disposition: A | Discharge status: Home / Self Care | Payer: BC Managed Care – PPO | Source: Ambulatory Visit | Attending: Orthopaedic Surgery | Admitting: Orthopaedic Surgery

## 2023-02-24 ENCOUNTER — Encounter (HOSPITAL_COMMUNITY): Admission: RE | Disposition: A | Discharge status: Home / Self Care | Payer: Self-pay | Source: Ambulatory Visit

## 2023-02-24 ENCOUNTER — Ambulatory Visit (HOSPITAL_COMMUNITY): Payer: BC Managed Care – PPO | Admitting: Anesthesiology

## 2023-02-24 ENCOUNTER — Other Ambulatory Visit: Payer: Self-pay

## 2023-02-24 ENCOUNTER — Encounter (HOSPITAL_COMMUNITY): Payer: Self-pay

## 2023-02-24 ENCOUNTER — Encounter: Payer: BC Managed Care – PPO | Admitting: Genetic Counselor

## 2023-02-24 DIAGNOSIS — E8842 MERRF syndrome: Secondary | ICD-10-CM | POA: Insufficient documentation

## 2023-02-24 DIAGNOSIS — J45909 Unspecified asthma, uncomplicated: Secondary | ICD-10-CM | POA: Diagnosis not present

## 2023-02-24 DIAGNOSIS — M25512 Pain in left shoulder: Secondary | ICD-10-CM | POA: Insufficient documentation

## 2023-02-24 DIAGNOSIS — F419 Anxiety disorder, unspecified: Secondary | ICD-10-CM | POA: Insufficient documentation

## 2023-02-24 DIAGNOSIS — K219 Gastro-esophageal reflux disease without esophagitis: Secondary | ICD-10-CM | POA: Diagnosis not present

## 2023-02-24 DIAGNOSIS — M75102 Unspecified rotator cuff tear or rupture of left shoulder, not specified as traumatic: Secondary | ICD-10-CM | POA: Diagnosis not present

## 2023-02-24 DIAGNOSIS — Z8673 Personal history of transient ischemic attack (TIA), and cerebral infarction without residual deficits: Secondary | ICD-10-CM | POA: Insufficient documentation

## 2023-02-24 HISTORY — DX: Personal history of other diseases of the nervous system and sense organs: Z86.69

## 2023-02-24 HISTORY — DX: Family history of other specified conditions: Z84.89

## 2023-02-24 HISTORY — PX: RADIOLOGY WITH ANESTHESIA: SHX6223

## 2023-02-24 LAB — GLUCOSE, CAPILLARY
Glucose-Capillary: 95 mg/dL (ref 70–99)
Glucose-Capillary: 94 mg/dL (ref 70–99)

## 2023-02-24 SURGERY — MRI WITH ANESTHESIA
Anesthesia: General

## 2023-02-24 MED ORDER — ORAL CARE MOUTH RINSE: 15.0000 mL | Freq: Once | OROMUCOSAL | Status: AC

## 2023-02-24 MED ORDER — LIDOCAINE 2% (20 MG/ML) 5 ML SYRINGE
INTRAMUSCULAR | Status: DC | PRN
  Administered 2023-02-24: 20 mg via INTRAVENOUS

## 2023-02-24 MED ORDER — ONDANSETRON HCL 4 MG/2ML IJ SOLN
INTRAMUSCULAR | Status: DC | PRN
  Administered 2023-02-24: 4 mg via INTRAVENOUS

## 2023-02-24 MED ORDER — DEXMEDETOMIDINE HCL IN NACL 80 MCG/20ML IV SOLN
INTRAVENOUS | Status: DC | PRN
  Administered 2023-02-24 (×3): 8 ug via BUCCAL
  Administered 2023-02-24: 4 ug via BUCCAL

## 2023-02-24 MED ORDER — CHLORHEXIDINE GLUCONATE 0.12 % MT SOLN
15.0000 mL | Freq: Once | OROMUCOSAL | Status: AC
  Administered 2023-02-24: 15 mL via OROMUCOSAL

## 2023-02-24 MED ORDER — PROPOFOL 10 MG/ML IV BOLUS
INTRAVENOUS | Status: DC | PRN
  Administered 2023-02-24: 150 mg via INTRAVENOUS

## 2023-02-24 MED ORDER — HEPARIN SOD (PORK) LOCK FLUSH 100 UNIT/ML IV SOLN
500.0000 [IU] | INTRAVENOUS | Status: AC | PRN
  Administered 2023-02-24: 500 [IU]

## 2023-02-24 MED ORDER — PHENYLEPHRINE 80 MCG/ML (10ML) SYRINGE FOR IV PUSH (FOR BLOOD PRESSURE SUPPORT)
PREFILLED_SYRINGE | INTRAVENOUS | Status: DC | PRN
  Administered 2023-02-24: 80 ug via INTRAVENOUS

## 2023-02-24 MED ORDER — DEXAMETHASONE SODIUM PHOSPHATE 10 MG/ML IJ SOLN
INTRAMUSCULAR | Status: DC | PRN
  Administered 2023-02-24: 10 mg via INTRAVENOUS

## 2023-02-24 MED ORDER — EPHEDRINE SULFATE-NACL 50-0.9 MG/10ML-% IV SOSY
PREFILLED_SYRINGE | INTRAVENOUS | Status: DC | PRN
  Administered 2023-02-24: 5 mg via INTRAVENOUS

## 2023-02-24 MED ORDER — MIDAZOLAM HCL 2 MG/2ML IJ SOLN
INTRAMUSCULAR | Status: DC | PRN
  Administered 2023-02-24 (×2): 1 mg via INTRAVENOUS
  Administered 2023-02-24: 2 mg via INTRAVENOUS

## 2023-02-24 MED ORDER — GLYCOPYRROLATE PF 0.2 MG/ML IJ SOSY
PREFILLED_SYRINGE | INTRAMUSCULAR | Status: DC | PRN
  Administered 2023-02-24: .2 mg via INTRAVENOUS

## 2023-02-24 MED ORDER — LACTATED RINGERS IV SOLN: INTRAVENOUS | Status: DC

## 2023-02-24 NOTE — Progress Notes (Signed)
Patient placed in phase 2, awaiting IV team for removal of port-a-cath.

## 2023-02-24 NOTE — Transfer of Care (Signed)
Immediate Anesthesia Transfer of Care Note  Patient: [name]  Procedure(s) Performed: MRI LEFT SHOULDER WITHOUT CONTRAST WITH ANESTHESIA  Patient Location: PACU  Anesthesia Type:General  Level of Consciousness: sedated  Airway & Oxygen Therapy: Patient Spontanous Breathing  Post-op Assessment: Report given to RN and Post -op Vital signs reviewed and stable  Post vital signs: Reviewed and stable  Last Vitals:  Vitals Value Taken Time  BP 99/51 02/24/23 0901  Temp    Pulse 52 02/24/23 0905  Resp 16 02/24/23 0905  SpO2 98 % 02/24/23 0905  Vitals shown include unvalidated device data.  Last Pain:  Vitals:   02/24/23 0631  TempSrc:   PainSc: 0-No pain         Complications: No notable events documented.

## 2023-02-24 NOTE — Progress Notes (Signed)
[facility] Summary Report / Loop Recorder 

## 2023-02-25 ENCOUNTER — Encounter (HOSPITAL_COMMUNITY): Payer: Self-pay | Admitting: Radiology

## 2023-02-25 NOTE — Anesthesia Postprocedure Evaluation (Signed)
Anesthesia Post Note  Patient: [name]  Procedure(s) Performed: MRI LEFT SHOULDER WITHOUT CONTRAST WITH ANESTHESIA     Patient location during evaluation: PACU Anesthesia Type: General Level of consciousness: awake and alert Pain management: pain level controlled Vital Signs Assessment: post-procedure vital signs reviewed and stable Respiratory status: spontaneous breathing, nonlabored ventilation, respiratory function stable and patient connected to nasal cannula oxygen Cardiovascular status: blood pressure returned to baseline and stable Postop Assessment: no apparent nausea or vomiting Anesthetic complications: no  No notable events documented.  Last Vitals:  Vitals:   02/24/23 0915 02/24/23 0930  BP: (!) 105/58 (!) 91/54  Pulse: 69 (!) 48  Resp: 14 13  Temp:  (!) 36.4 C  SpO2: 99% 99%    Last Pain:  Vitals:   02/24/23 0930  TempSrc:   PainSc: 0-No pain                 Nghia Mcentee L Nickholas [place]

## 2023-03-02 DIAGNOSIS — M25512 Pain in left shoulder: Secondary | ICD-10-CM | POA: Diagnosis not present

## 2023-03-02 NOTE — Telephone Encounter (Signed)
Pt called no answer left a voice mail  that either one would be okay, depending on what her orthodontist recommends for her.

## 2023-03-02 NOTE — Telephone Encounter (Signed)
I think either one would be okay, depending on what her orthodontist recommends for her. Thanks

## 2023-03-04 ENCOUNTER — Other Ambulatory Visit (HOSPITAL_COMMUNITY): Payer: Self-pay | Admitting: Orthopaedic Surgery

## 2023-03-04 DIAGNOSIS — M25512 Pain in left shoulder: Secondary | ICD-10-CM

## 2023-03-23 ENCOUNTER — Ambulatory Visit (INDEPENDENT_AMBULATORY_CARE_PROVIDER_SITE_OTHER): Payer: BC Managed Care – PPO

## 2023-03-23 DIAGNOSIS — R55 Syncope and collapse: Secondary | ICD-10-CM

## 2023-03-23 LAB — CUP PACEART REMOTE DEVICE CHECK
Date Time Interrogation Session: 20240405230024
Implantable Pulse Generator Implant Date: 20231117

## 2023-03-24 NOTE — Progress Notes (Signed)
[facility] Summary Report / Loop Recorder 

## 2023-03-25 DIAGNOSIS — M25312 Other instability, left shoulder: Secondary | ICD-10-CM | POA: Diagnosis not present

## 2023-03-30 ENCOUNTER — Other Ambulatory Visit: Payer: Self-pay | Admitting: Gastroenterology

## 2023-04-02 DIAGNOSIS — Z713 Dietary counseling and surveillance: Secondary | ICD-10-CM | POA: Diagnosis not present

## 2023-04-15 ENCOUNTER — Non-Acute Institutional Stay (HOSPITAL_COMMUNITY)
Admission: RE | Admit: 2023-04-15 | Discharge: 2023-04-15 | Disposition: A | Discharge status: Home / Self Care | Payer: BC Managed Care – PPO | Source: Ambulatory Visit | Attending: Internal Medicine | Admitting: Internal Medicine

## 2023-04-15 DIAGNOSIS — Z452 Encounter for adjustment and management of vascular access device: Secondary | ICD-10-CM | POA: Insufficient documentation

## 2023-04-15 MED ORDER — HEPARIN SOD (PORK) LOCK FLUSH 100 UNIT/ML IV SOLN
500.0000 [IU] | INTRAVENOUS | Status: AC | PRN
  Administered 2023-04-15: 500 [IU]
  Filled 2023-04-15: qty 5

## 2023-04-15 MED ORDER — SODIUM CHLORIDE 0.9% FLUSH
10.0000 mL | INTRAVENOUS | Status: AC | PRN
  Administered 2023-04-15: 10 mL

## 2023-04-15 NOTE — Progress Notes (Signed)
PATIENT CARE CENTER NOTE:  Provider:  Shirline Frees NP     Procedure: Port-a-cath flush     Note: Patient's PAC accessed using sterile technique, flushed with 10 cc 0.9% sodium chloride and heparin. Blood return noted. PAC de-accessed and site covered with band aid. Patient tolerated well. Patient declined AVS. Instructed pt to schedule monthly port flush at front desk prior to leaving, pt verbalized understanding.  Alert, oriented and ambulatory at discharge accompanied by support person.

## 2023-04-27 ENCOUNTER — Ambulatory Visit (INDEPENDENT_AMBULATORY_CARE_PROVIDER_SITE_OTHER): Payer: BC Managed Care – PPO

## 2023-04-27 DIAGNOSIS — R55 Syncope and collapse: Secondary | ICD-10-CM | POA: Diagnosis not present

## 2023-04-27 LAB — CUP PACEART REMOTE DEVICE CHECK
Date Time Interrogation Session: 20240512230150
Implantable Pulse Generator Implant Date: 20231117

## 2023-04-27 NOTE — Progress Notes (Signed)
[facility] Summary Report / Loop Recorder 

## 2023-05-01 ENCOUNTER — Telehealth (INDEPENDENT_AMBULATORY_CARE_PROVIDER_SITE_OTHER): Payer: BC Managed Care – PPO | Admitting: Adult Health

## 2023-05-01 ENCOUNTER — Encounter: Payer: Self-pay | Admitting: Adult Health

## 2023-05-01 VITALS — Temp 102.7°F | Ht 64.0 in

## 2023-05-01 DIAGNOSIS — J020 Streptococcal pharyngitis: Secondary | ICD-10-CM | POA: Diagnosis not present

## 2023-05-01 MED ORDER — AZITHROMYCIN 250 MG PO TABS: ORAL_TABLET | ORAL | 0 refills | Status: AC

## 2023-05-01 MED ORDER — PREDNISONE 10 MG PO TABS: 10.0000 mg | ORAL_TABLET | Freq: Every day | ORAL | 0 refills | Status: AC

## 2023-05-01 NOTE — Progress Notes (Signed)
Virtual Visit via Video Note  I connected with [name] on 05/01/23 at  4:00 PM EDT by a video enabled telemedicine application and verified that I am speaking with the correct person using two identifiers.  Location patient: home Location provider:work or home office Persons participating in the virtual visit: patient, provider  I discussed the limitations of evaluation and management by telemedicine and the availability of in person appointments. The patient expressed understanding and agreed to proceed.   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  She is being evaluated to for an acute issue. Her symptoms started 4-5 days ago. Symptoms include left ear pain, nasal congestion, sore throat,  fever up to 102.7, and body aches.   She has taken 4 covid tests at home and all have been negative.   She took an at home strep test today and this was positive.   She has been using Mucinex with some relief.   ROS: See pertinent positives and negatives per HPI.  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   Gallstones    GERD (gastroesophageal reflux disease)    H/O absence seizures    Head injury, intracranial, with concussion    Hearing loss    both ears   History of asthma    no current med.   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)    Migraines    Pneumonia 12/2018   hx of    PONV (postoperative nausea and vomiting)    PTSD (post-traumatic stress disorder)    Seizures (HCC)    complex partial   Shoulder dislocation 09/2018   Left   Shoulder subluxation, left    Syncope    Tremor, unspecified    bilateral arms, comes and goes   Wears glasses    Wears hearing aid    bilateral    Past Surgical History:  Procedure Laterality Date   ABDOMINAL HYSTERECTOMY     APPENDECTOMY     BIOPSY  10/20/2022   Procedure: BIOPSY;  Surgeon: Tressia Danas, MD;  Location: [facility];  Service: Gastroenterology;;   CHOLECYSTECTOMY N/A 10/05/2018   Procedure: LAPAROSCOPIC CHOLECYSTECTOMY WITH INTRAOPERATIVE CHOLANGIOGRAM ERAS PATHWAY;  Surgeon: Ovidio Kin, MD;  Location: [facility];  Service: General;  Laterality: N/A;   CYSTOSCOPY N/A 03/12/2021   Procedure: CYSTOSCOPY;  Surgeon: Edwinna Areola, DO;  Location: [facility] OR;  Service: Gynecology;  Laterality: N/A;   ESOPHAGOGASTRODUODENOSCOPY (EGD) WITH PROPOFOL N/A 10/20/2022   Procedure: ESOPHAGOGASTRODUODENOSCOPY (EGD) WITH PROPOFOL;  Surgeon: Tressia Danas, MD;  Location: [facility];  Service: Gastroenterology;  Laterality: N/A;    LAPAROSCOPIC APPENDECTOMY  11/26/2011   Procedure: APPENDECTOMY LAPAROSCOPIC;  Surgeon: Atilano Ina, MD;  Location: [facility] OR;  Service: General;  Laterality: N/A;   left shoulder manipulation     in er multilple times last done july 2021   MASTECTOMY     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   NIPPLE SPARING MASTECTOMY Bilateral 02/25/2022   Procedure: BILATERAL NIPPLE-SPARING MASTECTOMY;  Surgeon: Almond Lint, MD;  Location: [facility] OR;  Service: General;  Laterality: Bilateral;   PORT-A-CATH REMOVAL N/A 12/10/2021   Procedure: REMOVAL PORT-A-CATH AND REPLACEMENT;  Surgeon: Fritzi Mandes, MD;  Location: [facility] OR;  Service: General;  Laterality: N/A;   PORTACATH PLACEMENT Right 08/28/2017   Procedure: POWER PORT PLACEMENT;  Surgeon: Ovidio Kin, MD;  Location: [facility];  Service: General;  Laterality: Right;   PORTACATH PLACEMENT N/A 12/04/2017   Procedure: INSERTION PORT-A-CATH;  Surgeon: Ovidio Kin, MD;  Location[facility];  Service: General;  Laterality: N/A;   RADIOLOGY WITH ANESTHESIA Left 10/25/2019   Procedure: MRI  LEFT SHOLDER WITHOUT CONTRAST;  Surgeon: Radiologist, Medication, MD;  Location: [facility] OR;  Service: Radiology;  Laterality: Left;   RADIOLOGY WITH ANESTHESIA Bilateral 11/19/2021   Procedure: MRI WITH ANESTHESIA OF BILATERAL BREASTS WITH AND WITHOUT CONTRAST;  Surgeon: Radiologist, Medication, MD;  Location: [facility] OR;  Service: Radiology;  Laterality: Bilateral;   RADIOLOGY WITH ANESTHESIA N/A 02/24/2023   Procedure: MRI LEFT SHOULDER WITHOUT CONTRAST WITH ANESTHESIA;  Surgeon: Radiologist, Medication, MD;  Location: [facility] OR;  Service: Radiology;  Laterality: N/A;   REPAIR ANKLE LIGAMENT Left    x 3   TOTAL LAPAROSCOPIC HYSTERECTOMY WITH SALPINGECTOMY Bilateral 03/12/2021   Procedure: TOTAL LAPAROSCOPIC HYSTERECTOMY WITH SALPINGECTOMY;  Surgeon: Edwinna Areola, DO;  Location: [facility] OR;  Service: Gynecology;  Laterality: Bilateral;     Family History  Problem Relation Age of Onset   Polymyalgia rheumatica Mother    Heart disease Mother    Sudden Cardiac Death Father        long QT syndrome    Heart disease Maternal Grandmother    Breast cancer Maternal Grandmother    Stomach cancer Maternal Grandmother    Heart disease Maternal Grandfather    Stroke Maternal Grandfather    Diabetes Maternal Grandfather    COPD Maternal Grandfather    Kidney failure Maternal Grandfather    Colon cancer Paternal Grandmother 40   Heart disease Paternal Grandmother    Heart disease Paternal Grandfather    Migraines Paternal Grandfather    Esophageal cancer Cousin    Pancreatic cancer Neg Hx        Current Outpatient Medications:    acetaminophen (TYLENOL) 325 MG tablet, Take 2 tablets (650 mg total) by mouth every 6 (six) hours as needed (pain)., Disp: , Rfl:    albuterol (VENTOLIN HFA) 108 (90 Base) MCG/ACT inhaler, Inhale 2 puffs into the lungs every 6 (six) hours as needed for  wheezing or shortness of breath., Disp: 6.7 g, Rfl: 3   buPROPion (WELLBUTRIN XL) 300 MG 24 hr tablet, Take 300 mg by mouth 2 (two) times daily., Disp: , Rfl:    busPIRone (BUSPAR) 15 MG tablet, Take 15 mg by mouth 3 (three) times daily., Disp: , Rfl:    Continuous Blood Gluc Sensor (FREESTYLE LIBRE 2 SENSOR) MISC, USE WITH LIBRE APP, Disp: 6 each, Rfl: 2   Continuous Blood Gluc Transmit (DEXCOM G6 TRANSMITTER) MISC, Use with dexcom receiver and sensor, Disp: 1 each, Rfl: 3   fluticasone furoate-vilanterol (BREO ELLIPTA) 100-25 MCG/ACT AEPB, Inhale 1 puff into the lungs daily. (Patient taking differently: Inhale 1 puff into the lungs daily. Addition puff if needed), Disp: 1 each, Rfl: 11   Glucagon, rDNA, (GLUCAGON EMERGENCY) 1 MG KIT, Use PRN for hypoglycemia, Disp: 1 kit, Rfl: 3   glucose monitoring kit (FREESTYLE) monitoring kit, 1 each by Does not apply route as needed for other. Free style libre on right arm, Disp: , Rfl:    Lacosamide (VIMPAT)  100 MG TABS, Take 1 tablet in AM, 2 tablets in PM (Patient taking differently: Take 200 mg by mouth at bedtime.), Disp: 90 tablet, Rfl: 5   levETIRAcetam (KEPPRA) 500 MG tablet, Take 1 tablet twice a day, Disp: 180 tablet, Rfl: 3   LORazepam (ATIVAN) 0.5 MG tablet, Take 1 tablet as needed for seizure. Do not take more than 2-3 a week., Disp: 10 tablet, Rfl: 5   montelukast (SINGULAIR) 10 MG tablet, TAKE 1 TABLET BY MOUTH EVERYDAY AT BEDTIME, Disp: 90 tablet, Rfl: 3   MOTEGRITY 2 MG TABS, TAKE 1 TABLET BY MOUTH EVERY DAY, Disp: 30 tablet, Rfl: 5   nitroGLYCERIN (NITRODUR - DOSED IN MG/24 HR) 0.1 mg/hr patch, Place 0.1 mg onto the skin daily., Disp: , Rfl:    ondansetron (ZOFRAN-ODT) 8 MG disintegrating tablet, Take 8 mg by mouth every 8 (eight) hours as needed for nausea or vomiting., Disp: , Rfl:    perampanel (FYCOMPA) 8 MG tablet, Take 1 tablet (8 mg total) by mouth at bedtime. Take 1 tablet every night, Disp: 90 tablet, Rfl: 3   topiramate (TOPAMAX) 25 MG tablet, Take 50 mg by mouth at bedtime., Disp: , Rfl:    TRULANCE 3 MG TABS, TAKE 1 TABLET(3 MG) BY MOUTH DAILY., Disp: 90 tablet, Rfl: 0   omeprazole (PRILOSEC) 40 MG capsule, Take 1 capsule (40 mg total) by mouth 2 (two) times daily. (Patient not taking: Reported on 02/23/2023), Disp: 180 capsule, Rfl: 0  Current Facility-Administered Medications:    alteplase (CATHFLO ACTIVASE) injection 2 mg, 2 mg, Intracatheter, Once, Dineen Conradt, Kandee Keen, NP   alteplase (CATHFLO ACTIVASE) injection 2 mg, 2 mg, Intracatheter, Once PRN, Cian Costanzo, Kandee Keen, NP  EXAM:  VITALS per patient if applicable:  GENERAL: alert, oriented, appears ill but in no acute distress  HEENT: atraumatic, conjunttiva clear, no obvious abnormalities on inspection of external nose and ears  NECK: normal movements of the head and neck  LUNGS: on inspection no signs of respiratory distress, breathing rate appears normal, no obvious gross SOB, gasping or wheezing  CV: no obvious  cyanosis  MS: moves all visible extremities without noticeable abnormality  PSYCH/NEURO: pleasant and cooperative, no obvious depression or anxiety, speech and thought processing grossly intact  ASSESSMENT AND PLAN:  Discussed the following assessment and plan:  1. Strep throat - Allergies to PCN and Cephalosporins. Will treat with Azithromycin for suspected strep throat.  - azithromycin (ZITHROMAX) 250 MG tablet;  Take 2 tablets on day 1, then 1 tablet daily on days 2 through 5  Dispense: 6 tablet; Refill: 0 - predniSONE (DELTASONE) 10 MG tablet; Take 1 tablet (10 mg total) by mouth daily with breakfast for 5 days.  Dispense: 5 tablet; Refill: 0 - Follow up if not resolving or go to the ER this weekend if symptoms worsen      I discussed the assessment and treatment plan with the patient. The patient was provided an opportunity to ask questions and all were answered. The patient agreed with the plan and demonstrated an understanding of the instructions.   The patient was advised to call back or seek an in-person evaluation if the symptoms worsen or if the condition fails to improve as anticipated.   Shirline Frees, NP

## 2023-05-12 ENCOUNTER — Ambulatory Visit: Payer: BC Managed Care – PPO | Admitting: Neurology

## 2023-05-13 DIAGNOSIS — M79642 Pain in left hand: Secondary | ICD-10-CM | POA: Diagnosis not present

## 2023-05-14 DIAGNOSIS — S60222A Contusion of left hand, initial encounter: Secondary | ICD-10-CM | POA: Diagnosis not present

## 2023-05-15 DIAGNOSIS — F410 Panic disorder [episodic paroxysmal anxiety] without agoraphobia: Secondary | ICD-10-CM | POA: Diagnosis not present

## 2023-05-15 DIAGNOSIS — F4312 Post-traumatic stress disorder, chronic: Secondary | ICD-10-CM | POA: Diagnosis not present

## 2023-05-18 DIAGNOSIS — G40909 Epilepsy, unspecified, not intractable, without status epilepticus: Secondary | ICD-10-CM | POA: Diagnosis not present

## 2023-05-18 DIAGNOSIS — M79642 Pain in left hand: Secondary | ICD-10-CM | POA: Diagnosis not present

## 2023-05-21 NOTE — Progress Notes (Signed)
[facility] Summary Report / Loop Recorder 

## 2023-05-25 DIAGNOSIS — S60222A Contusion of left hand, initial encounter: Secondary | ICD-10-CM | POA: Diagnosis not present

## 2023-05-25 DIAGNOSIS — M24412 Recurrent dislocation, left shoulder: Secondary | ICD-10-CM | POA: Diagnosis not present

## 2023-06-01 ENCOUNTER — Ambulatory Visit (INDEPENDENT_AMBULATORY_CARE_PROVIDER_SITE_OTHER): Payer: BC Managed Care – PPO

## 2023-06-01 DIAGNOSIS — R55 Syncope and collapse: Secondary | ICD-10-CM

## 2023-06-01 LAB — CUP PACEART REMOTE DEVICE CHECK
Date Time Interrogation Session: 20240616230305
Implantable Pulse Generator Implant Date: 20231117

## 2023-06-02 ENCOUNTER — Other Ambulatory Visit: Payer: Self-pay | Admitting: Adult Health

## 2023-06-05 ENCOUNTER — Non-Acute Institutional Stay (HOSPITAL_COMMUNITY)
Admission: RE | Admit: 2023-06-05 | Discharge: 2023-06-05 | Disposition: A | Discharge status: Home / Self Care | Payer: BC Managed Care – PPO | Source: Ambulatory Visit | Attending: Internal Medicine | Admitting: Internal Medicine

## 2023-06-05 DIAGNOSIS — Z452 Encounter for adjustment and management of vascular access device: Secondary | ICD-10-CM | POA: Insufficient documentation

## 2023-06-05 MED ORDER — HEPARIN SOD (PORK) LOCK FLUSH 100 UNIT/ML IV SOLN
500.0000 [IU] | INTRAVENOUS | Status: AC | PRN
  Administered 2023-06-05: 500 [IU]
  Filled 2023-06-05: qty 5

## 2023-06-05 MED ORDER — SODIUM CHLORIDE 0.9% FLUSH
10.0000 mL | INTRAVENOUS | Status: AC | PRN
  Administered 2023-06-05: 10 mL

## 2023-06-05 NOTE — Progress Notes (Signed)
PATIENT CARE CENTER NOTE  Provider: Shirline Frees NP  Procedure: PAC Flush  Note: Patient's PAC accessed using sterile technique, blood return noted. PAC flushed with 0.9% Sodium Chloride and Heparin. PAC de-accessed and site covered with band-aid. Pt tolerated well. Pt advised to schedule monthly appointment at front desk, pt verbalized understanding. Pt is alert, oriented, and ambulatory at discharge, and accompanied by support person.

## 2023-06-19 NOTE — Progress Notes (Signed)
[facility] Summary Report / Loop Recorder 

## 2023-06-24 DIAGNOSIS — M25512 Pain in left shoulder: Secondary | ICD-10-CM | POA: Diagnosis not present

## 2023-06-24 DIAGNOSIS — M25312 Other instability, left shoulder: Secondary | ICD-10-CM | POA: Diagnosis not present

## 2023-06-26 ENCOUNTER — Encounter: Payer: Self-pay | Admitting: Neurology

## 2023-06-26 ENCOUNTER — Ambulatory Visit (INDEPENDENT_AMBULATORY_CARE_PROVIDER_SITE_OTHER): Payer: BC Managed Care – PPO | Admitting: Neurology

## 2023-06-26 VITALS — BP 105/70 | HR 70 | Ht 64.0 in | Wt 98.0 lb

## 2023-06-26 DIAGNOSIS — G40019 Localization-related (focal) (partial) idiopathic epilepsy and epileptic syndromes with seizures of localized onset, intractable, without status epilepticus: Secondary | ICD-10-CM | POA: Diagnosis not present

## 2023-06-26 DIAGNOSIS — G43009 Migraine without aura, not intractable, without status migrainosus: Secondary | ICD-10-CM

## 2023-06-26 MED ORDER — LORAZEPAM 0.5 MG PO TABS
ORAL_TABLET | ORAL | 5 refills | Status: DC
Start: 2023-06-26 — End: 2024-02-19

## 2023-06-26 MED ORDER — TOPIRAMATE 100 MG PO TABS: 100.0000 mg | ORAL_TABLET | Freq: Two times a day (BID) | ORAL | 3 refills | Status: DC

## 2023-06-26 MED ORDER — FYCOMPA 8 MG PO TABS: 8.0000 mg | ORAL_TABLET | Freq: Every day | ORAL | 3 refills | Status: DC

## 2023-06-26 MED ORDER — LACOSAMIDE 100 MG PO TABS
ORAL_TABLET | ORAL | 3 refills | Status: DC
Start: 2023-06-26 — End: 2024-08-19

## 2023-06-26 NOTE — Patient Instructions (Addendum)
Good to see you.  Increasing Topiramate 100mg : take 1/2 tablet in AM, 1 tablet in PM for a week, then increase to 1 tablet twice a day  2. Start weaning off the Keppra (Levetiracetam) 500mg : take 1 tablet every night for 1 week, then reduce to 1 tablet every other night for 1 week, then stop.  3. Continue all your other medications  4. Follow-up in 6-8 months, call for any changes   Seizure Precautions: 1. If medication has been prescribed for you to prevent seizures, take it exactly as directed.  Do not stop taking the medicine without talking to your doctor first, even if you have not had a seizure in a long time.   2. Avoid activities in which a seizure would cause danger to yourself or to others.  Don't operate dangerous machinery, swim alone, or climb in high or dangerous places, such as on ladders, roofs, or girders.  Do not drive unless your doctor says you may.  3. If you have any warning that you may have a seizure, lay down in a safe place where you can't hurt yourself.    4.  No driving for 6 months from last seizure, as per [facility].   Please refer to the following link on the [employer]'s website for more information: http://www.epilepsyfoundation.org/answerplace/Social/driving/drivingu.cfm   5.  Maintain good sleep hygiene. Avoid alcohol.  6.  Contact your doctor if you have any problems that may be related to the medicine you are taking.  7.  Call 911 and bring the patient back to the ED if:        A.  The seizure lasts longer than 5 minutes.       B.  The patient doesn't awaken shortly after the seizure  C.  The patient has new problems such as difficulty seeing, speaking or moving  D.  The patient was injured during the seizure  E.  The patient has a temperature over 102 F (39C)  F.  The patient vomited and now is having trouble breathing
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
## 2023-07-06 ENCOUNTER — Ambulatory Visit (INDEPENDENT_AMBULATORY_CARE_PROVIDER_SITE_OTHER): Payer: BC Managed Care – PPO

## 2023-07-06 DIAGNOSIS — R55 Syncope and collapse: Secondary | ICD-10-CM

## 2023-07-06 LAB — CUP PACEART REMOTE DEVICE CHECK
Date Time Interrogation Session: 20240719230012
Implantable Pulse Generator Implant Date: 20231117

## 2023-07-08 ENCOUNTER — Encounter (HOSPITAL_COMMUNITY): Admission: RE | Admit: 2023-07-08 | Payer: BC Managed Care – PPO | Source: Ambulatory Visit

## 2023-07-09 ENCOUNTER — Telehealth: Payer: Self-pay

## 2023-07-09 ENCOUNTER — Other Ambulatory Visit: Payer: Self-pay | Admitting: Orthopaedic Surgery

## 2023-07-09 DIAGNOSIS — G5601 Carpal tunnel syndrome, right upper limb: Secondary | ICD-10-CM | POA: Diagnosis not present

## 2023-07-09 NOTE — Telephone Encounter (Signed)
Primary Cardiologist:Branch, Alben Spittle, MD   Preoperative team, please contact this patient and set up a phone call appointment for further preoperative risk assessment. Please obtain consent and complete medication review. Thank you for your help.   I confirm that guidance regarding antiplatelet and oral anticoagulation therapy has been completed and, if necessary, noted below. (None requested)  Levi Aland, NP-C  07/09/2023, 3:58 PM [address] Office [phone] Fax [phone]

## 2023-07-09 NOTE — Telephone Encounter (Signed)
Left message to call back to set up tele pre op appt.  

## 2023-07-09 NOTE — Telephone Encounter (Signed)
   Pre-operative Risk Assessment    Patient Name: [name]  DOB: Apr 29, 1991 MRN: [id number]      Request for Surgical Clearance    Procedure:   Right Carpel Tunnel Release  Date of Surgery:  Clearance 08/18/23                                 Surgeon:  Lubertha Basque. Jerl Santos, MD Surgeon's Group or Practice Name:  [facility] Phone number:  [phone] Fax number:  [phone]   Type of Clearance Requested:   - Medical    Type of Anesthesia:  General    Additional requests/questions:    Garrel Ridgel   07/09/2023, 3:32 PM

## 2023-07-10 ENCOUNTER — Telehealth: Payer: Self-pay | Admitting: *Deleted

## 2023-07-10 ENCOUNTER — Ambulatory Visit: Payer: BC Managed Care – PPO | Admitting: Adult Health

## 2023-07-10 NOTE — Telephone Encounter (Signed)
S/w the pt and she has been scheduled for tele pre op appt 07/28/23 @ 9:40. Med rec and consent are done. Pt states she is on a cancellation list for her procedure to see if she can get it done sooner. The tele appt we set up today she states would had been the cancellation date for the surgeon but states she does not feel that they will be able to mover surgery up as they were pretty booked up as well. I stated yes our tele visit are pretty booked out as well.

## 2023-07-10 NOTE — Telephone Encounter (Signed)
S/w the pt and she has been scheduled for tele pre op appt 07/28/23 @ 9:40. Med rec and consent are done. Pt states she is on a cancellation list for her procedure to see if she can get it done sooner. The tele appt we set up today she states would had been the cancellation date for the surgeon but states she does not feel that they will be able to mover surgery up as they were pretty booked up as well. I stated yes our tele visit are pretty booked out as well.     Patient Consent for Virtual Visit        [name] has provided verbal consent on 07/10/2023 for a virtual visit (video or telephone).   CONSENT FOR VIRTUAL VISIT FOR:  [name]  By participating in this virtual visit I agree to the following:  I hereby voluntarily request, consent and authorize [facility] and its employed or contracted physicians, physician assistants, nurse practitioners or other licensed health care professionals (the Practitioner), to provide me with telemedicine health care services (the "Services") as deemed necessary by the treating Practitioner. I acknowledge and consent to receive the Services by the Practitioner via telemedicine. I understand that the telemedicine visit will involve communicating with the Practitioner through live audiovisual communication technology and the disclosure of certain medical information by electronic transmission. I acknowledge that I have been given the opportunity to request an in-person assessment or other available alternative prior to the telemedicine visit and am voluntarily participating in the telemedicine visit.  I understand that I have the right to withhold or withdraw my consent to the use of telemedicine in the course of my care at any time, without affecting my right to future care or treatment, and that the Practitioner or I may terminate the telemedicine visit at any time. I understand that I have the right to inspect all information obtained and/or  recorded in the course of the telemedicine visit and may receive copies of available information for a reasonable fee.  I understand that some of the potential risks of receiving the Services via telemedicine include:  Delay or interruption in medical evaluation due to technological equipment failure or disruption; Information transmitted may not be sufficient (e.g. poor resolution of images) to allow for appropriate medical decision making by the Practitioner; and/or  In rare instances, security protocols could fail, causing a breach of personal health information.  Furthermore, I acknowledge that it is my responsibility to provide information about my medical history, conditions and care that is complete and accurate to the best of my ability. I acknowledge that Practitioner's advice, recommendations, and/or decision may be based on factors not within their control, such as incomplete or inaccurate data provided by me or distortions of diagnostic images or specimens that may result from electronic transmissions. I understand that the practice of medicine is not an exact science and that Practitioner makes no warranties or guarantees regarding treatment outcomes. I acknowledge that a copy of this consent can be made available to me via my patient portal [facility] MyChart), or I can request a printed copy by calling the office of [facility].    I understand that my insurance will be billed for this visit.   I have read or had this consent read to me. I understand the contents of this consent, which adequately explains the benefits and risks of the Services being provided via telemedicine.  I have been provided ample opportunity to ask  questions regarding this consent and the Services and have had my questions answered to my satisfaction. I give my informed consent for the services to be provided through the use of telemedicine in my medical care

## 2023-07-16 ENCOUNTER — Ambulatory Visit (HOSPITAL_COMMUNITY): Admit: 2023-07-16 | Payer: BC Managed Care – PPO | Admitting: Orthopedic Surgery

## 2023-07-16 SURGERY — ARTHROSCOPY, SHOULDER, WITH GLENOID LABRUM REPAIR
Anesthesia: General | Site: Shoulder | Laterality: Left

## 2023-07-16 NOTE — Progress Notes (Signed)
[facility] Summary Report / Loop Recorder 

## 2023-07-27 ENCOUNTER — Telehealth: Payer: Self-pay | Admitting: Adult Health

## 2023-07-27 NOTE — Telephone Encounter (Signed)
Lurena Joiner from [facility] [phone]  Faxed a Surgical Clearance Request on 07/09/23.  Still waiting for response.   Will fax again this morning.   Informed NP is OOO on Mondays.

## 2023-07-27 NOTE — Telephone Encounter (Signed)
This referral did not need to come from PCP pt was made aware of this. Tried to called ortho but no answer.

## 2023-07-28 ENCOUNTER — Ambulatory Visit: Payer: BC Managed Care – PPO | Attending: Interventional Cardiology

## 2023-07-28 DIAGNOSIS — Z01810 Encounter for preprocedural cardiovascular examination: Secondary | ICD-10-CM | POA: Diagnosis not present

## 2023-07-28 NOTE — Progress Notes (Signed)
COVID Vaccine Completed: yes  Date of COVID positive in last 90 days:  PCP - Shirline Frees, NP Cardiologist - Carolan Clines, MD Neurologist- Patrcia Dolly, MD  Cardiac clearance by Jari Favre 07/28/23 in Epic   Chest x-ray -  EKG - 10/13/22 Epic Stress Test -  ECHO - 10/04/18 Epic Cardiac Cath -  Pacemaker/ICD device last checked: 07/03/23 Epic loop recorder Spinal Cord Stimulator:  Bowel Prep -   Sleep Study -  CPAP -   Fasting Blood Sugar -  Checks Blood Sugar _____ times a day  Last dose of GLP1 agonist-  N/A GLP1 instructions:  N/A   Last dose of SGLT-2 inhibitors-  N/A SGLT-2 instructions: N/A   Blood Thinner Instructions:  Time Aspirin Instructions: Last Dose:  Activity level:  Can go up a flight of stairs and perform activities of daily living without stopping and without symptoms of chest pain or shortness of breath.  Able to exercise without symptoms  Unable to go up a flight of stairs without symptoms of     Anesthesia review: cryptogenic stroke, hypoglycemia, seizures, MERFF syndrome, beast cancer, asthma, loop recorder  Patient denies shortness of breath, fever, cough and chest pain at PAT appointment  Patient verbalized understanding of instructions that were given to them at the PAT appointment. Patient was also instructed that they will need to review over the PAT instructions again at home before surgery.

## 2023-07-28 NOTE — Progress Notes (Signed)
Virtual Visit via Telephone Note   Because of [name]'s co-morbid illnesses, she is at least at moderate risk for complications without adequate follow up.  This format is felt to be most appropriate for this patient at this time.  The patient did not have access to video technology/had technical difficulties with video requiring transitioning to audio format only (telephone).  All issues noted in this document were discussed and addressed.  No physical exam could be performed with this format.  Please refer to the patient's chart for her consent to telehealth for [facility].  Evaluation Performed:  Preoperative cardiovascular risk assessment _____________   Date:  07/28/2023   Patient ID:  [name], DOB 1991/12/02, MRN [id number] Patient Location:  Home Provider location:   Office  Primary Care Provider:  Shirline Frees, NP Primary Cardiologist:  Maisie Fus, MD  Chief Complaint / Patient Profile   32 y.o. y/o female with a h/o syncope, ILR insertion, family history of sudden death who is pending carpal tunnel release and presents today for telephonic preoperative cardiovascular risk assessment.  History of Present Illness    [name] is a 32 y.o. female who presents via audio/video conferencing for a telehealth visit today.  Pt was last seen in cardiology clinic on 10/31/2022 by Dr. Antoine Poche.  At that time [name] was doing well .  The patient is now pending procedure as outlined above. Since her last visit, she tells me she has occasional palpitations but no episodes of syncope.  No chest pain or shortness of breath.  She ran 6 miles a day.  She also enjoys horseback riding.  For this reason she exceeds 4 METS on the DASI.  No medications indicated as needing held.    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   Gallstones    GERD (gastroesophageal reflux disease)    H/O absence seizures    Head injury, intracranial, with concussion    Hearing loss    both ears   History of asthma    no current med.   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)    Migraines    Pneumonia 12/2018   hx of    PONV (postoperative nausea and vomiting)    PTSD (post-traumatic stress disorder)    Seizures (HCC)    complex partial   Shoulder dislocation 09/2018   Left   Shoulder subluxation, left    Syncope    Tremor, unspecified    bilateral arms, comes and goes   Wears glasses    Wears hearing aid    bilateral  Past Surgical History:  Procedure Laterality Date   ABDOMINAL HYSTERECTOMY     APPENDECTOMY     BIOPSY  10/20/2022   Procedure: BIOPSY;  Surgeon: Tressia Danas, MD;  Location: [facility];  Service: Gastroenterology;;   CHOLECYSTECTOMY N/A 10/05/2018   Procedure: LAPAROSCOPIC CHOLECYSTECTOMY WITH INTRAOPERATIVE CHOLANGIOGRAM ERAS PATHWAY;  Surgeon: Ovidio Kin, MD;  Location: [facility];  Service: General;  Laterality: N/A;   CYSTOSCOPY N/A 03/12/2021   Procedure: CYSTOSCOPY;  Surgeon: Edwinna Areola, DO;  Location: [facility];  Service: Gynecology;  Laterality: N/A;   ESOPHAGOGASTRODUODENOSCOPY (EGD) WITH PROPOFOL N/A 10/20/2022   Procedure: ESOPHAGOGASTRODUODENOSCOPY (EGD) WITH PROPOFOL;  Surgeon: Tressia Danas, MD;  Location: [facility];  Service: Gastroenterology;  Laterality:  N/A;   LAPAROSCOPIC APPENDECTOMY  11/26/2011   Procedure: APPENDECTOMY LAPAROSCOPIC;  Surgeon: Atilano Ina, MD;  Location[facility];  Service: General;  Laterality: N/A;   left shoulder manipulation     in er multilple times last done july 2021   MASTECTOMY     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   NIPPLE SPARING MASTECTOMY Bilateral 02/25/2022   Procedure: BILATERAL NIPPLE-SPARING MASTECTOMY;  Surgeon: Almond Lint, MD;  Location: [facility];  Service: General;  Laterality: Bilateral;   PORT-A-CATH REMOVAL N/A 12/10/2021   Procedure: REMOVAL PORT-A-CATH AND REPLACEMENT;  Surgeon: Fritzi Mandes, MD;  Location: [facility];  Service: General;  Laterality: N/A;   PORTACATH PLACEMENT Right 08/28/2017   Procedure: POWER PORT PLACEMENT;  Surgeon: Ovidio Kin, MD;  Location: [facility];  Service: General;  Laterality: Right;   PORTACATH PLACEMENT N/A 12/04/2017   Procedure: INSERTION PORT-A-CATH;  Surgeon: Ovidio Kin, MD;  Location: [facility] OR;  Service: General;  Laterality: N/A;   RADIOLOGY WITH ANESTHESIA Left 10/25/2019   Procedure: MRI  LEFT SHOLDER WITHOUT CONTRAST;  Surgeon: Radiologist, Medication, MD;  Location: [facility];  Service: Radiology;  Laterality: Left;   RADIOLOGY WITH ANESTHESIA Bilateral 11/19/2021   Procedure: MRI WITH ANESTHESIA OF BILATERAL BREASTS WITH AND WITHOUT CONTRAST;  Surgeon: Radiologist, Medication, MD;  Location: [facility];  Service: Radiology;  Laterality: Bilateral;   RADIOLOGY WITH ANESTHESIA N/A 02/24/2023   Procedure: MRI LEFT SHOULDER WITHOUT CONTRAST WITH ANESTHESIA;  Surgeon: Radiologist, Medication, MD;  Location: [facility];  Service: Radiology;  Laterality: N/A;   REPAIR ANKLE LIGAMENT Left    x 3   TOTAL LAPAROSCOPIC HYSTERECTOMY WITH SALPINGECTOMY Bilateral 03/12/2021   Procedure: TOTAL LAPAROSCOPIC HYSTERECTOMY WITH SALPINGECTOMY;  Surgeon: Edwinna Areola, DO;  Location: [facility];  Service: Gynecology;  Laterality:  Bilateral;    Allergies  Allergies  Allergen Reactions   Cefzil [Cefprozil] Anaphylaxis, Shortness Of Breath and Rash   Cephalosporins Anaphylaxis, Shortness Of Breath and Rash   Latex Shortness Of Breath and Rash   Penicillins Anaphylaxis, Hives, Rash and Other (See Comments)    Tongue swells, throat does not close  Did it involve swelling of the face/tongue/throat, SOB, or low BP? No Did it involve sudden or severe rash/hives, skin peeling, or any reaction on the inside of your mouth or nose? Yes Did you need to seek medical attention at a hospital or doctor's office? No When did it last happen?      4-5 years If all above answers are "NO", may proceed with cephalosporin use.    Ciprofloxacin Rash    Rash and vomiting.   Shellfish Allergy Hives and Rash   Grass Pollen(K-O-R-T-Swt Vern) Hives   Morphine And Codeine Nausea And  Vomiting    Severe n/v   Tegaderm Ag Mesh [Silver] Rash   Vancomycin Hives, Itching and Rash    Home Medications    Prior to Admission medications   Medication Sig Start Date End Date Taking? Authorizing Provider  acetaminophen (TYLENOL) 325 MG tablet Take 2 tablets (650 mg total) by mouth every 6 (six) hours as needed (pain). 12/10/21   Fritzi Mandes, MD  albuterol (VENTOLIN HFA) 108 (90 Base) MCG/ACT inhaler Inhale 2 puffs into the lungs every 6 (six) hours as needed for wheezing or shortness of breath. 09/19/21   Nafziger, Kandee Keen, NP  buPROPion (WELLBUTRIN XL) 300 MG 24 hr tablet Take 300 mg by mouth 2 (two) times daily. 03/31/22   [provider]  busPIRone (BUSPAR) 15 MG tablet Take 15 mg by mouth 2 (two) times daily. 04/29/22   [provider]  Continuous Blood Gluc Sensor (FREESTYLE LIBRE 2 SENSOR) MISC USE WITH LIBRE APP 08/19/22   Nafziger, Kandee Keen, NP  Continuous Blood Gluc Transmit (DEXCOM G6 TRANSMITTER) MISC Use with dexcom receiver and sensor 07/03/21   Nafziger, Kandee Keen, NP  fluticasone furoate-vilanterol (BREO ELLIPTA) 100-25 MCG/ACT  AEPB Inhale 1 puff into the lungs daily. 02/03/23   Nafziger, Kandee Keen, NP  gabapentin (NEURONTIN) 100 MG capsule Take 100 mg by mouth at bedtime.    [provider]  Glucagon, rDNA, (GLUCAGON EMERGENCY) 1 MG KIT Use PRN for hypoglycemia 10/09/20   Nafziger, Kandee Keen, NP  glucose monitoring kit (FREESTYLE) monitoring kit 1 each by Does not apply route as needed for other. Free style libre on right arm    [provider]  Lacosamide (VIMPAT) 100 MG TABS Take 1 tablet in AM, 2 tablets in PM 06/26/23   Van Clines, MD  LORazepam (ATIVAN) 0.5 MG tablet Take 1 tablet as needed for seizure. Do not take more than 2-3 a week. 06/26/23   Van Clines, MD  montelukast (SINGULAIR) 10 MG tablet TAKE 1 TABLET BY MOUTH EVERYDAY AT BEDTIME 09/09/22   Nafziger, Kandee Keen, NP  MOTEGRITY 2 MG TABS TAKE 1 TABLET BY MOUTH EVERY DAY 09/19/22   Tressia Danas, MD  perampanel (FYCOMPA) 8 MG tablet Take 1 tablet (8 mg total) by mouth at bedtime. Take 1 tablet every night 06/26/23   Van Clines, MD  topiramate (TOPAMAX) 100 MG tablet Take 1 tablet (100 mg total) by mouth 2 (two) times daily. 06/26/23   Van Clines, MD    Physical Exam    Vital Signs:  [name] does not have vital signs available for review today.  Given telephonic nature of communication, physical exam is limited. AAOx3. NAD. Normal affect.  Speech and respirations are unlabored.  Accessory Clinical Findings    None  Assessment & Plan    1.  Preoperative Cardiovascular Risk Assessment:  [name]'s perioperative risk of a major cardiac event is 0.4% according to the Revised Cardiac Risk Index (RCRI).  Therefore, she is at low risk for perioperative complications.   Her functional capacity is excellent at 8.33 METs according to the [facility] Activity Status Index (DASI). Recommendations: According to ACC/AHA guidelines, no further cardiovascular testing needed.  The patient may proceed to surgery at acceptable risk.     The  patient was advised that if she develops new symptoms prior to surgery to contact our office to arrange for a follow-up visit, and she verbalized understanding.   A copy of this note will be routed to requesting surgeon.  Time:  Today, I have spent 5 minutes with the patient with telehealth technology discussing medical history, symptoms, and management plan.     Sharlene Dory, PA-C  07/28/2023, 9:56 AM

## 2023-07-28 NOTE — Patient Instructions (Signed)
SURGICAL WAITING ROOM VISITATION  Patients having surgery or a procedure may have no more than 2 support people in the waiting area - these visitors may rotate.    Children under the age of 60 must have an adult with them who is not the patient.  Due to an increase in RSV and influenza rates and associated hospitalizations, children ages 44 and under may not visit patients in [facility] hospitals.  If the patient needs to stay at the hospital during part of their recovery, the visitor guidelines for inpatient rooms apply. Pre-op nurse will coordinate an appropriate time for 1 support person to accompany patient in pre-op.  This support person may not rotate.    Please refer to the [facility] website for the visitor guidelines for Inpatients (after your surgery is over and you are in a regular room).    Your procedure is scheduled on: 08/18/23   Report to [facility] Main Entrance    Report to admitting at 2:00 PM   Call this number if you have problems the morning of surgery [phone]   Do not eat food :After Midnight.   After Midnight you may have the following liquids until 1:15 PM DAY OF SURGERY  Water Non-Citrus Juices (without pulp, NO RED-Apple, White grape, White cranberry) Black Coffee (NO MILK/CREAM OR CREAMERS, sugar ok)  Clear Tea (NO MILK/CREAM OR CREAMERS, sugar ok) regular and decaf                             Plain Jell-O (NO RED)                                           Fruit ices (not with fruit pulp, NO RED)                                     Popsicles (NO RED)                                                               Sports drinks like Gatorade (NO RED)    The day of surgery:  Drink ONE (1) Pre-Surgery Clear Ensure at 1:15 PM the morning of surgery. Drink in one sitting. Do not sip.  This drink was given to you during your hospital  pre-op appointment visit. Nothing else to drink after completing the  Pre-Surgery Clear Ensure.          If  you have questions, please contact your surgeon's office.   FOLLOW BOWEL PREP AND ANY ADDITIONAL PRE OP INSTRUCTIONS YOU RECEIVED FROM YOUR SURGEON'S OFFICE!!!     Oral Hygiene is also important to reduce your risk of infection.                                    Remember - BRUSH YOUR TEETH THE MORNING OF SURGERY WITH YOUR REGULAR TOOTHPASTE  DENTURES WILL BE REMOVED PRIOR TO SURGERY PLEASE DO NOT APPLY "Poly grip" OR ADHESIVES!!!  Stop all vitamins and herbal supplements 7 days before surgery.   Take these medicines the morning of surgery with A SIP OF WATER: Tylenol, Inhalers, Bupropion, Buspirone, Lorazepam, Topiramate                              You may not have any metal on your body including hair pins, jewelry, and body piercing             Do not wear make-up, lotions, powders, perfumes, or deodorant  Do not wear nail polish including gel and S&S, artificial/acrylic nails, or any other type of covering on natural nails including finger and toenails. If you have artificial nails, gel coating, etc. that needs to be removed by a nail salon please have this removed prior to surgery or surgery may need to be canceled/ delayed if the surgeon/ anesthesia feels like they are unable to be safely monitored.   Do not shave  48 hours prior to surgery.    Do not bring valuables to the hospital. [place] IS NOT             RESPONSIBLE   FOR VALUABLES.   Contacts, glasses, dentures or bridgework may not be worn into surgery.  DO NOT BRING YOUR HOME MEDICATIONS TO THE HOSPITAL. PHARMACY WILL DISPENSE MEDICATIONS LISTED ON YOUR MEDICATION LIST TO YOU DURING YOUR ADMISSION IN THE HOSPITAL!    Patients discharged on the day of surgery will not be allowed to drive home.  Someone NEEDS to stay with you for the first 24 hours after anesthesia.   Special Instructions: Bring a copy of your healthcare power of attorney and living will documents the day of surgery if you haven't scanned them  before.              Please read over the following fact sheets you were given: IF YOU HAVE QUESTIONS ABOUT YOUR PRE-OP INSTRUCTIONS PLEASE CALL [phone] Contras    If you received a COVID test during your pre-op visit  it is requested that you wear a mask when out in public, stay away from anyone that may not be feeling well and notify your surgeon if you develop symptoms. If you test positive for Covid or have been in contact with anyone that has tested positive in the last 10 days please notify you surgeon.    [place] - Preparing for Surgery Before surgery, you can play an important role.  Because skin is not sterile, your skin needs to be as free of germs as possible.  You can reduce the number of germs on your skin by washing with CHG (chlorahexidine gluconate) soap before surgery.  CHG is an antiseptic cleaner which kills germs and bonds with the skin to continue killing germs even after washing. Please DO NOT use if you have an allergy to CHG or antibacterial soaps.  If your skin becomes reddened/irritated stop using the CHG and inform your nurse when you arrive at Short Stay. Do not shave (including legs and underarms) for at least 48 hours prior to the first CHG shower.  You may shave your face/neck.  Please follow these instructions carefully:  1.  Shower with CHG Soap the night before surgery and the  morning of surgery.  2.  If you choose to wash your hair, wash your hair first as usual with your normal  shampoo.  3.  After you shampoo, rinse your hair and body  thoroughly to remove the shampoo.                             4.  Use CHG as you would any other liquid soap.  You can apply chg directly to the skin and wash.  Gently with a scrungie or clean washcloth.  5.  Apply the CHG Soap to your body ONLY FROM THE NECK DOWN.   Do   not use on face/ open                           Wound or open sores. Avoid contact with eyes, ears mouth and   genitals (private parts).                        Wash face,  Genitals (private parts) with your normal soap.             6.  Wash thoroughly, paying special attention to the area where your    surgery  will be performed.  7.  Thoroughly rinse your body with warm water from the neck down.  8.  DO NOT shower/wash with your normal soap after using and rinsing off the CHG Soap.                9.  Pat yourself dry with a clean towel.            10.  Wear clean pajamas.            11.  Place clean sheets on your bed the night of your first shower and do not  sleep with pets. Day of Surgery : Do not apply any lotions/deodorants the morning of surgery.  Please wear clean clothes to the hospital/surgery center.  FAILURE TO FOLLOW THESE INSTRUCTIONS MAY RESULT IN THE CANCELLATION OF YOUR SURGERY  PATIENT SIGNATURE_________________________________  NURSE SIGNATURE__________________________________  ________________________________________________________________________  Rogelia Mire  An incentive spirometer is a tool that can help keep your lungs clear and active. This tool measures how well you are filling your lungs with each breath. Taking long deep breaths may help reverse or decrease the chance of developing breathing (pulmonary) problems (especially infection) following: A long period of time when you are unable to move or be active. BEFORE THE PROCEDURE  If the spirometer includes an indicator to show your best effort, your nurse or respiratory therapist will set it to a desired goal. If possible, sit up straight or lean slightly forward. Try not to slouch. Hold the incentive spirometer in an upright position. INSTRUCTIONS FOR USE  Sit on the edge of your bed if possible, or sit up as far as you can in bed or on a chair. Hold the incentive spirometer in an upright position. Breathe out normally. Place the mouthpiece in your mouth and seal your lips tightly around it. Breathe in slowly and as deeply as possible, raising  the piston or the ball toward the top of the column. Hold your breath for 3-5 seconds or for as long as possible. Allow the piston or ball to fall to the bottom of the column. Remove the mouthpiece from your mouth and breathe out normally. Rest for a few seconds and repeat Steps 1 through 7 at least 10 times every 1-2 hours when you are awake. Take your time and take a few normal breaths between deep breaths. The spirometer may  include an indicator to show your best effort. Use the indicator as a goal to work toward during each repetition. After each set of 10 deep breaths, practice coughing to be sure your lungs are clear. If you have an incision (the cut made at the time of surgery), support your incision when coughing by placing a pillow or rolled up towels firmly against it. Once you are able to get out of bed, walk around indoors and cough well. You may stop using the incentive spirometer when instructed by your caregiver.  RISKS AND COMPLICATIONS Take your time so you do not get dizzy or light-headed. If you are in pain, you may need to take or ask for pain medication before doing incentive spirometry. It is harder to take a deep breath if you are having pain. AFTER USE Rest and breathe slowly and easily. It can be helpful to keep track of a log of your progress. Your caregiver can provide you with a simple table to help with this. If you are using the spirometer at home, follow these instructions: SEEK MEDICAL CARE IF:  You are having difficultly using the spirometer. You have trouble using the spirometer as often as instructed. Your pain medication is not giving enough relief while using the spirometer. You develop fever of 100.5 F (38.1 C) or higher. SEEK IMMEDIATE MEDICAL CARE IF:  You cough up bloody sputum that had not been present before. You develop fever of 102 F (38.9 C) or greater. You develop worsening pain at or near the incision site. MAKE SURE YOU:  Understand these  instructions. Will watch your condition. Will get help right away if you are not doing well or get worse. Document Released: 04/13/2007 Document Revised: 02/23/2012 Document Reviewed: 06/14/2007 [facility] Patient Information 2014 [place], [place].   ________________________________________________________________________

## 2023-07-29 ENCOUNTER — Other Ambulatory Visit: Payer: Self-pay

## 2023-07-29 ENCOUNTER — Encounter (HOSPITAL_COMMUNITY)
Admission: RE | Admit: 2023-07-29 | Discharge: 2023-07-29 | Disposition: A | Discharge status: Home / Self Care | Payer: BC Managed Care – PPO | Source: Ambulatory Visit | Attending: Orthopaedic Surgery | Admitting: Orthopaedic Surgery

## 2023-07-29 ENCOUNTER — Encounter (HOSPITAL_COMMUNITY): Payer: Self-pay

## 2023-07-29 VITALS — BP 113/74 | HR 65 | Temp 97.7°F | Resp 14 | Ht 64.0 in | Wt 99.0 lb

## 2023-07-29 DIAGNOSIS — Z01812 Encounter for preprocedural laboratory examination: Secondary | ICD-10-CM | POA: Insufficient documentation

## 2023-07-29 DIAGNOSIS — E8842 MERRF syndrome: Secondary | ICD-10-CM | POA: Insufficient documentation

## 2023-07-29 DIAGNOSIS — G40309 Generalized idiopathic epilepsy and epileptic syndromes, not intractable, without status epilepticus: Secondary | ICD-10-CM | POA: Insufficient documentation

## 2023-07-29 LAB — CBC
HCT: 38.8 % (ref 36.0–46.0)
Hemoglobin: 12.9 g/dL (ref 12.0–15.0)
MCH: 30 pg (ref 26.0–34.0)
MCHC: 33.2 g/dL (ref 30.0–36.0)
MCV: 90.2 fL (ref 80.0–100.0)
Platelets: 314 10*3/uL (ref 150–400)
RBC: 4.3 MIL/uL (ref 3.87–5.11)
RDW: 12.6 % (ref 11.5–15.5)
WBC: 6.2 10*3/uL (ref 4.0–10.5)
nRBC: 0 % (ref 0.0–0.2)

## 2023-07-29 LAB — BASIC METABOLIC PANEL
Anion gap: 9 (ref 5–15)
BUN: 20 mg/dL (ref 6–20)
CO2: 19 mmol/L — ABNORMAL LOW (ref 22–32)
Calcium: 8.5 mg/dL — ABNORMAL LOW (ref 8.9–10.3)
Chloride: 106 mmol/L (ref 98–111)
Creatinine, Ser: 0.71 mg/dL (ref 0.44–1.00)
GFR, Estimated: >60 mL/min (ref >60)
Glucose, Bld: 80 mg/dL (ref 70–99)
Potassium: 3.2 mmol/L — ABNORMAL LOW (ref 3.5–5.1)
Sodium: 134 mmol/L — ABNORMAL LOW (ref 135–145)

## 2023-07-29 NOTE — Telephone Encounter (Signed)
Please advise 

## 2023-07-29 NOTE — Telephone Encounter (Signed)
Surgical clearance filled out and faxed.

## 2023-08-06 ENCOUNTER — Encounter (HOSPITAL_COMMUNITY): Payer: Self-pay | Admitting: Physician Assistant

## 2023-08-06 LAB — CUP PACEART REMOTE DEVICE CHECK
Date Time Interrogation Session: 20240821230031
Implantable Pulse Generator Implant Date: 20231117

## 2023-08-10 ENCOUNTER — Ambulatory Visit: Payer: BC Managed Care – PPO

## 2023-08-10 DIAGNOSIS — R55 Syncope and collapse: Secondary | ICD-10-CM

## 2023-08-12 NOTE — Progress Notes (Signed)
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 Marcene Corning.   Per cardiology preoperative evaluation 07/28/2023, "Ms. Carrara's perioperative risk of a major cardiac event is 0.4% according to the Revised Cardiac Risk Index (RCRI).  Therefore, she is at low risk for perioperative complications.   Her functional capacity is excellent at 8.33 METs according to the Duke Activity Status Index (DASI). Recommendations: According to ACC/AHA guidelines, no further cardiovascular testing needed.  The patient may proceed to surgery at acceptable risk.  "  Per previous anesthesia notes, "After mastectomy 02/2022: "Pt experienced multiple seizures in PACU similar to all her previous anesthetics. Treated with versed and ativan. Longest seizure lasted ~1 min. No respiratory compromise. Vital signs remained stable. After final seizure, she was monitored in PACU for ~1 hour. She was discharged home in stable condition."   Pt last seen by neurology 06/26/2023.   Pt requests Dr. Armond Hang.  VS: LMP 02/11/2021   PROVIDERS: Shirline Frees, NP is PCP   Cardiologist - Carolan Clines, MD  Neurologist- Patrcia Dolly, MD LABS: Labs reviewed: Acceptable for surgery. (all labs ordered are listed, but only abnormal results are displayed)  Labs Reviewed - No data to display   IMAGES:   EKG:   CV: Echo 10/04/2018 - Left ventricle: The cavity size was normal. Systolic function was    normal. The estimated ejection fraction was in the range of 60%    to 65%. Wall motion was normal; there were no regional  wall    motion abnormalities. Left ventricular diastolic function    parameters were normal.  - Aortic valve: Transvalvular velocity was within the normal range.    There was no stenosis. There was no regurgitation.  - Mitral valve: Transvalvular velocity was within the normal range.    There was no evidence for stenosis. There was no regurgitation.  - Right ventricle: The cavity size was normal. Wall thickness was    normal. Systolic function was normal.  - Pulmonary arteries: Systolic pressure was within the normal    range. PA peak pressure: 20 mm Hg (S).  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   Gallstones    GERD (gastroesophageal reflux disease)    H/O absence seizures    Head injury, intracranial, with concussion    Hearing loss    both ears   History of asthma  no current med.   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)    Migraines    Pneumonia 12/2018   hx of    PONV (postoperative nausea and vomiting)    PTSD (post-traumatic stress disorder)    Seizures (HCC)    complex partial   Shoulder dislocation 09/2018   Left   Shoulder subluxation, left    Syncope    Tremor, unspecified    bilateral arms, comes and goes   Wears glasses    Wears hearing aid    bilateral    Past Surgical History:  Procedure Laterality Date   ABDOMINAL  HYSTERECTOMY     APPENDECTOMY     BIOPSY  10/20/2022   Procedure: BIOPSY;  Surgeon: Tressia Danas, MD;  Location: [facility];  Service: Gastroenterology;;   CHOLECYSTECTOMY N/A 10/05/2018   Procedure: LAPAROSCOPIC CHOLECYSTECTOMY WITH INTRAOPERATIVE CHOLANGIOGRAM ERAS PATHWAY;  Surgeon: Ovidio Kin, MD;  Location: [facility];  Service: General;  Laterality: N/A;   CYSTOSCOPY N/A 03/12/2021   Procedure: CYSTOSCOPY;  Surgeon: Edwinna Areola, DO;  Location: [facility];  Service: Gynecology;  Laterality: N/A;   ESOPHAGOGASTRODUODENOSCOPY (EGD) WITH PROPOFOL N/A 10/20/2022   Procedure: ESOPHAGOGASTRODUODENOSCOPY (EGD) WITH PROPOFOL;  Surgeon: Tressia Danas, MD;  Location: [facility];  Service: Gastroenterology;  Laterality: N/A;   LAPAROSCOPIC APPENDECTOMY  11/26/2011   Procedure: APPENDECTOMY LAPAROSCOPIC;  Surgeon: Atilano Ina, MD;  Location: [facility];  Service: General;  Laterality: N/A;   left shoulder manipulation     in er multilple times last done july 2021   MASTECTOMY     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   NIPPLE SPARING MASTECTOMY Bilateral 02/25/2022   Procedure: BILATERAL NIPPLE-SPARING MASTECTOMY;  Surgeon: Almond Lint, MD;  Location: [facility];  Service: General;  Laterality: Bilateral;   PORT-A-CATH REMOVAL N/A 12/10/2021   Procedure: REMOVAL PORT-A-CATH AND REPLACEMENT;  Surgeon: Fritzi Mandes, MD;  Location: [facility];  Service: General;  Laterality: N/A;   PORTACATH PLACEMENT Right 08/28/2017   Procedure: POWER PORT PLACEMENT;  Surgeon: Ovidio Kin, MD;  Location: [facility];  Service: General;  Laterality: Right;   PORTACATH PLACEMENT N/A 12/04/2017   Procedure: INSERTION PORT-A-CATH;  Surgeon: Ovidio Kin, MD;  [facility];  Service: General;  Laterality: N/A;   RADIOLOGY WITH ANESTHESIA Left 10/25/2019   Procedure: MRI  LEFT SHOLDER WITHOUT CONTRAST;  Surgeon: Radiologist, Medication, MD;  Location: [facility];   Service: Radiology;  Laterality: Left;   RADIOLOGY WITH ANESTHESIA Bilateral 11/19/2021   Procedure: MRI WITH ANESTHESIA OF BILATERAL BREASTS WITH AND WITHOUT CONTRAST;  Surgeon: Radiologist, Medication, MD;  Location: [facility];  Service: Radiology;  Laterality: Bilateral;   RADIOLOGY WITH ANESTHESIA N/A 02/24/2023   Procedure: MRI LEFT SHOULDER WITHOUT CONTRAST WITH ANESTHESIA;  Surgeon: Radiologist, Medication, MD;  Location: [facility];  Service: Radiology;  Laterality: N/A;   REPAIR ANKLE LIGAMENT Left    x 3   TOTAL LAPAROSCOPIC HYSTERECTOMY WITH SALPINGECTOMY Bilateral 03/12/2021   Procedure: TOTAL LAPAROSCOPIC HYSTERECTOMY WITH SALPINGECTOMY;  Surgeon: Edwinna Areola, DO;  Location: [facility];  Service: Gynecology;  Laterality: Bilateral;    MEDICATIONS:  alteplase (CATHFLO ACTIVASE) injection 2 mg    acetaminophen (TYLENOL) 325 MG tablet   albuterol (VENTOLIN HFA) 108 (90 Base) MCG/ACT inhaler   buPROPion (WELLBUTRIN XL) 300 MG 24 hr  tablet   busPIRone (BUSPAR) 15 MG tablet   fluticasone furoate-vilanterol (BREO ELLIPTA) 100-25 MCG/ACT AEPB   gabapentin (NEURONTIN) 100 MG capsule   Lacosamide (VIMPAT) 100 MG TABS   LORazepam (ATIVAN) 0.5 MG tablet   montelukast (SINGULAIR) 10 MG tablet   MOTEGRITY 2 MG TABS   perampanel (FYCOMPA) 8 MG tablet   topiramate (TOPAMAX) 100 MG tablet   Continuous Blood Gluc Sensor (FREESTYLE LIBRE 2 SENSOR) MISC   Continuous Blood Gluc Transmit (DEXCOM G6 TRANSMITTER) MISC   Glucagon, rDNA, (GLUCAGON EMERGENCY) 1 MG KIT   glucose monitoring kit (FREESTYLE) monitoring kit    [employer], PA-C [facility] [phone]

## 2023-08-12 NOTE — Anesthesia Preprocedure Evaluation (Signed)
Anesthesia Evaluation    Airway        Dental   Pulmonary           Cardiovascular      Neuro/Psych    GI/Hepatic   Endo/Other    Renal/GU      Musculoskeletal   Abdominal   Peds  Hematology   Anesthesia Other Findings   Reproductive/Obstetrics                             Anesthesia Physical Anesthesia Plan  ASA:   Anesthesia Plan:    Post-op Pain Management:    Induction:   PONV Risk Score and Plan:   Airway Management Planned:   Additional Equipment:   Intra-op Plan:   Post-operative Plan:   Informed Consent:   Plan Discussed with:   Anesthesia Plan Comments: (See PAT note 08/12/2023)       Anesthesia Quick Evaluation

## 2023-08-13 ENCOUNTER — Other Ambulatory Visit: Payer: Self-pay

## 2023-08-13 DIAGNOSIS — R202 Paresthesia of skin: Secondary | ICD-10-CM

## 2023-08-13 DIAGNOSIS — F410 Panic disorder [episodic paroxysmal anxiety] without agoraphobia: Secondary | ICD-10-CM | POA: Diagnosis not present

## 2023-08-13 DIAGNOSIS — F4312 Post-traumatic stress disorder, chronic: Secondary | ICD-10-CM | POA: Diagnosis not present

## 2023-08-14 ENCOUNTER — Ambulatory Visit: Payer: BC Managed Care – PPO | Admitting: Neurology

## 2023-08-14 ENCOUNTER — Telehealth: Payer: Self-pay | Admitting: Neurology

## 2023-08-14 DIAGNOSIS — R202 Paresthesia of skin: Secondary | ICD-10-CM

## 2023-08-14 DIAGNOSIS — G5601 Carpal tunnel syndrome, right upper limb: Secondary | ICD-10-CM

## 2023-08-14 NOTE — H&P (Signed)
[name] is an 32 y.o. female.   Chief Complaint: right hand numbness HPI: Kit is here today for follow-up of her right hand.  She is a history of carpal tunnel syndrome.  She has had 2 nerve conduction studies.  The most recent was about 5 or 6 years ago.  She has tried multiple injections, physical therapy, and continues with bracing regularly.  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  She has to shake them out and then will subside and then return again.  She is getting quite frustrated with this.  She is now at the point where she is interested in surgery.   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   Gallstones    GERD (gastroesophageal reflux disease)    H/O absence seizures    Head injury, intracranial, with concussion    Hearing loss    both ears   History of asthma    no current med.   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)    Migraines    Pneumonia 12/2018   hx of    PONV (postoperative nausea and vomiting)    PTSD (post-traumatic stress disorder)    Seizures (HCC)    complex partial   Shoulder dislocation 09/2018   Left   Shoulder subluxation, left    Syncope    Tremor, unspecified    bilateral arms, comes and goes   Wears glasses    Wears hearing  aid    bilateral    Past Surgical History:  Procedure Laterality Date   ABDOMINAL HYSTERECTOMY     APPENDECTOMY     BIOPSY  10/20/2022   Procedure: BIOPSY;  Surgeon: Tressia Danas, MD;  Location: [facility];  Service: Gastroenterology;;   CHOLECYSTECTOMY N/A 10/05/2018   Procedure: LAPAROSCOPIC CHOLECYSTECTOMY WITH INTRAOPERATIVE CHOLANGIOGRAM ERAS PATHWAY;  Surgeon: Ovidio Kin, MD;  Location: [facility];  Service: General;  Laterality: N/A;   CYSTOSCOPY N/A 03/12/2021   Procedure: CYSTOSCOPY;  Surgeon: Edwinna Areola, DO;  Location: [facility];  Service: Gynecology;  Laterality: N/A;   ESOPHAGOGASTRODUODENOSCOPY (EGD) WITH PROPOFOL N/A 10/20/2022   Procedure: ESOPHAGOGASTRODUODENOSCOPY (EGD) WITH PROPOFOL;  Surgeon: Tressia Danas, MD;  Location: [facility];  Service: Gastroenterology;  Laterality: N/A;   LAPAROSCOPIC APPENDECTOMY  11/26/2011   Procedure: APPENDECTOMY LAPAROSCOPIC;  Surgeon: Atilano Ina, MD;  Location: [facility] OR;  Service: General;  Laterality: N/A;   left shoulder manipulation     in er multilple times last done july 2021   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   NIPPLE SPARING MASTECTOMY Bilateral 02/25/2022   Procedure: BILATERAL NIPPLE-SPARING MASTECTOMY;  Surgeon: Almond Lint, MD;  Location: [facility];  Service: General;  Laterality: Bilateral;   PORT-A-CATH REMOVAL N/A 12/10/2021   Procedure: REMOVAL PORT-A-CATH AND REPLACEMENT;  Surgeon: Fritzi Mandes, MD;  Location: [facility];  Service: General;  Laterality: N/A;   PORTACATH PLACEMENT Right 08/28/2017   Procedure: POWER PORT PLACEMENT;  Surgeon: Ovidio Kin, MD;  Location: [facility];  Service: General;  Laterality: Right;   PORTACATH PLACEMENT N/A 12/04/2017   Procedure: INSERTION PORT-A-CATH;  Surgeon: Ovidio Kin, MD;  Location: [facility] OR;  Service: General;  Laterality: N/A;   RADIOLOGY WITH ANESTHESIA Left 10/25/2019   Procedure: MRI  LEFT  SHOLDER WITHOUT CONTRAST;  Surgeon: Radiologist, Medication, MD;  Location: [facility];  Service: Radiology;  Laterality: Left;   RADIOLOGY WITH ANESTHESIA Bilateral 11/19/2021   Procedure: MRI WITH ANESTHESIA OF BILATERAL BREASTS WITH AND WITHOUT CONTRAST;  Surgeon: Radiologist, Medication, MD;  Location: [facility];  Service: Radiology;  Laterality: Bilateral;   RADIOLOGY WITH ANESTHESIA N/A 02/24/2023   Procedure: MRI LEFT SHOULDER WITHOUT CONTRAST WITH ANESTHESIA;  Surgeon: Radiologist, Medication, MD;  Location: [facility];  Service: Radiology;  Laterality: N/A;   REPAIR ANKLE LIGAMENT Left    x 3   TOTAL LAPAROSCOPIC HYSTERECTOMY WITH SALPINGECTOMY Bilateral 03/12/2021   Procedure: TOTAL LAPAROSCOPIC HYSTERECTOMY WITH SALPINGECTOMY;  Surgeon: Edwinna Areola, DO;  Location: [facility];  Service: Gynecology;  Laterality: Bilateral;    Family History  Problem Relation Age of Onset   Polymyalgia rheumatica Mother    Heart disease Mother    Sudden Cardiac Death Father        long QT syndrome    Heart disease Maternal Grandmother    Breast cancer Maternal Grandmother    Stomach cancer Maternal Grandmother    Heart disease Maternal Grandfather    Stroke Maternal Grandfather    Diabetes Maternal Grandfather    COPD Maternal Grandfather    Kidney failure Maternal Grandfather    Colon cancer Paternal Grandmother 40   Heart disease Paternal Grandmother    Heart disease Paternal Grandfather    Migraines Paternal Grandfather    Esophageal cancer Cousin    Pancreatic cancer Neg Hx    Social History:  reports that she has never smoked. She has never used smokeless tobacco. She reports that she does not drink alcohol and does not use drugs.  Allergies:  Allergies  Allergen Reactions   Cefzil [Cefprozil] Anaphylaxis, Shortness Of Breath and Rash   Cephalosporins Anaphylaxis, Shortness Of Breath and Rash   Latex Shortness Of Breath and Rash   Penicillins Anaphylaxis, Hives, Rash and Other (See  Comments)    Tongue swells, throat does not close  Did it involve swelling of the face/tongue/throat, SOB, or low BP? No Did it involve sudden or severe rash/hives, skin peeling, or any reaction on the inside of your mouth or nose? Yes Did you need to seek medical attention at a hospital or doctor's office? No When did it last happen?      4-5 years If all above answers are "NO", may proceed with cephalosporin use.    Ciprofloxacin Rash    Rash and vomiting.   Shellfish Allergy Hives and Rash   Grass Pollen(K-O-R-T-Swt Vern) Hives   Morphine And Codeine Nausea And Vomiting    Severe n/v   Tegaderm Ag Mesh [Silver] Rash  Vancomycin Hives, Itching and Rash    No medications prior to admission.    No results found for this or any previous visit (from the past 48 hour(s)). No results found.  Review of Systems  Musculoskeletal:  Positive for arthralgias.       Right hand pain  All other systems reviewed and are negative.   Last menstrual period 02/11/2021. Physical Exam Constitutional:      Appearance: Normal appearance.  HENT:     Head: Normocephalic and atraumatic.     Nose: Nose normal.     Mouth/Throat:     Pharynx: Oropharynx is clear.  Eyes:     Extraocular Movements: Extraocular movements intact.  Pulmonary:     Effort: Pulmonary effort is normal.  Abdominal:     Palpations: Abdomen is soft.  Musculoskeletal:     Cervical back: Normal range of motion.     Comments: Examination of bilateral hand shows no obvious swelling or deformity.  She has a positive Phalen's at about 5 seconds on the right.  Negative Phalen's on the left.  She has a mildly positive Tinel's at the wrist on the right.  Negative on the left.  Fairly good grip strength bilaterally.  She is neurovascularly intact distally bilaterally.    Skin:    General: Skin is warm and dry.  Neurological:     General: No focal deficit present.     Mental Status: She is alert and oriented to person, place, and  time. Mental status is at baseline.  Psychiatric:        Mood and Affect: Mood normal.        Behavior: Behavior normal.        Thought Content: Thought content normal.        Judgment: Judgment normal.      Assessment/Plan Assessment:  Right carpal tunnel syndrome with NCS 2 elsewhere.  Plan: I reviewed the risk of anesthesia, infection, DVT, bleeding related to a right carpal tunnel release.  She is in agreement and would like to proceed.  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  She would like Dr. Roxan Hockey for this as she has worked well with her in the past.  I informed her that she would be in a big bandage for about 7-10 days we will see her back to her stitches.  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  We will have her meet with Lurena Joiner to try and get this set up as soon as safe as possible.  Ginger Organ Alezandra Egli, PA-C 08/14/2023, 8:28 AM

## 2023-08-14 NOTE — Pre-Procedure Instructions (Signed)
Notified patient that surgery for 9/3 is now @ 1145.  She needs to arrive @ 0930.  Patient verbalized understanding.

## 2023-08-14 NOTE — H&P (View-Only) (Signed)
[name] is an 32 y.o. female.   Chief Complaint: right hand numbness HPI: Kit is here today for follow-up of her right hand.  She is a history of carpal tunnel syndrome.  She has had 2 nerve conduction studies.  The most recent was about 5 or 6 years ago.  She has tried multiple injections, physical therapy, and continues with bracing regularly.  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  She has to shake them out and then will subside and then return again.  She is getting quite frustrated with this.  She is now at the point where she is interested in surgery.   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   Gallstones    GERD (gastroesophageal reflux disease)    H/O absence seizures    Head injury, intracranial, with concussion    Hearing loss    both ears   History of asthma    no current med.   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)    Migraines    Pneumonia 12/2018   hx of    PONV (postoperative nausea and vomiting)    PTSD (post-traumatic stress disorder)    Seizures (HCC)    complex partial   Shoulder dislocation 09/2018   Left   Shoulder subluxation, left    Syncope    Tremor, unspecified    bilateral arms, comes and goes   Wears glasses    Wears hearing  aid    bilateral    Past Surgical History:  Procedure Laterality Date   ABDOMINAL HYSTERECTOMY     APPENDECTOMY     BIOPSY  10/20/2022   Procedure: BIOPSY;  Surgeon: Tressia Danas, MD;  Location: [facility];  Service: Gastroenterology;;   CHOLECYSTECTOMY N/A 10/05/2018   Procedure: LAPAROSCOPIC CHOLECYSTECTOMY WITH INTRAOPERATIVE CHOLANGIOGRAM ERAS PATHWAY;  Surgeon: Ovidio Kin, MD;  Location: [facility];  Service: General;  Laterality: N/A;   CYSTOSCOPY N/A 03/12/2021   Procedure: CYSTOSCOPY;  Surgeon: Edwinna Areola, DO;  Location: [facility];  Service: Gynecology;  Laterality: N/A;   ESOPHAGOGASTRODUODENOSCOPY (EGD) WITH PROPOFOL N/A 10/20/2022   Procedure: ESOPHAGOGASTRODUODENOSCOPY (EGD) WITH PROPOFOL;  Surgeon: Tressia Danas, MD;  Location: [facility];  Service: Gastroenterology;  Laterality: N/A;   LAPAROSCOPIC APPENDECTOMY  11/26/2011   Procedure: APPENDECTOMY LAPAROSCOPIC;  Surgeon: Atilano Ina, MD;  Location: [facility] OR;  Service: General;  Laterality: N/A;   left shoulder manipulation     in er multilple times last done july 2021   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   NIPPLE SPARING MASTECTOMY Bilateral 02/25/2022   Procedure: BILATERAL NIPPLE-SPARING MASTECTOMY;  Surgeon: Almond Lint, MD;  Location: [facility];  Service: General;  Laterality: Bilateral;   PORT-A-CATH REMOVAL N/A 12/10/2021   Procedure: REMOVAL PORT-A-CATH AND REPLACEMENT;  Surgeon: Fritzi Mandes, MD;  Location: [facility];  Service: General;  Laterality: N/A;   PORTACATH PLACEMENT Right 08/28/2017   Procedure: POWER PORT PLACEMENT;  Surgeon: Ovidio Kin, MD;  Location: [facility];  Service: General;  Laterality: Right;   PORTACATH PLACEMENT N/A 12/04/2017   Procedure: INSERTION PORT-A-CATH;  Surgeon: Ovidio Kin, MD;  Location: [facility] OR;  Service: General;  Laterality: N/A;   RADIOLOGY WITH ANESTHESIA Left 10/25/2019   Procedure: MRI  LEFT  SHOLDER WITHOUT CONTRAST;  Surgeon: Radiologist, Medication, MD;  Location: [facility];  Service: Radiology;  Laterality: Left;   RADIOLOGY WITH ANESTHESIA Bilateral 11/19/2021   Procedure: MRI WITH ANESTHESIA OF BILATERAL BREASTS WITH AND WITHOUT CONTRAST;  Surgeon: Radiologist, Medication, MD;  Location: [facility];  Service: Radiology;  Laterality: Bilateral;   RADIOLOGY WITH ANESTHESIA N/A 02/24/2023   Procedure: MRI LEFT SHOULDER WITHOUT CONTRAST WITH ANESTHESIA;  Surgeon: Radiologist, Medication, MD;  Location: [facility];  Service: Radiology;  Laterality: N/A;   REPAIR ANKLE LIGAMENT Left    x 3   TOTAL LAPAROSCOPIC HYSTERECTOMY WITH SALPINGECTOMY Bilateral 03/12/2021   Procedure: TOTAL LAPAROSCOPIC HYSTERECTOMY WITH SALPINGECTOMY;  Surgeon: Edwinna Areola, DO;  Location: [facility];  Service: Gynecology;  Laterality: Bilateral;    Family History  Problem Relation Age of Onset   Polymyalgia rheumatica Mother    Heart disease Mother    Sudden Cardiac Death Father        long QT syndrome    Heart disease Maternal Grandmother    Breast cancer Maternal Grandmother    Stomach cancer Maternal Grandmother    Heart disease Maternal Grandfather    Stroke Maternal Grandfather    Diabetes Maternal Grandfather    COPD Maternal Grandfather    Kidney failure Maternal Grandfather    Colon cancer Paternal Grandmother 40   Heart disease Paternal Grandmother    Heart disease Paternal Grandfather    Migraines Paternal Grandfather    Esophageal cancer Cousin    Pancreatic cancer Neg Hx    Social History:  reports that she has never smoked. She has never used smokeless tobacco. She reports that she does not drink alcohol and does not use drugs.  Allergies:  Allergies  Allergen Reactions   Cefzil [Cefprozil] Anaphylaxis, Shortness Of Breath and Rash   Cephalosporins Anaphylaxis, Shortness Of Breath and Rash   Latex Shortness Of Breath and Rash   Penicillins Anaphylaxis, Hives, Rash and Other (See  Comments)    Tongue swells, throat does not close  Did it involve swelling of the face/tongue/throat, SOB, or low BP? No Did it involve sudden or severe rash/hives, skin peeling, or any reaction on the inside of your mouth or nose? Yes Did you need to seek medical attention at a hospital or doctor's office? No When did it last happen?      4-5 years If all above answers are "NO", may proceed with cephalosporin use.    Ciprofloxacin Rash    Rash and vomiting.   Shellfish Allergy Hives and Rash   Grass Pollen(K-O-R-T-Swt Vern) Hives   Morphine And Codeine Nausea And Vomiting    Severe n/v   Tegaderm Ag Mesh [Silver] Rash  Vancomycin Hives, Itching and Rash    No medications prior to admission.    No results found for this or any previous visit (from the past 48 hour(s)). No results found.  Review of Systems  Musculoskeletal:  Positive for arthralgias.       Right hand pain  All other systems reviewed and are negative.   Last menstrual period 02/11/2021. Physical Exam Constitutional:      Appearance: Normal appearance.  HENT:     Head: Normocephalic and atraumatic.     Nose: Nose normal.     Mouth/Throat:     Pharynx: Oropharynx is clear.  Eyes:     Extraocular Movements: Extraocular movements intact.  Pulmonary:     Effort: Pulmonary effort is normal.  Abdominal:     Palpations: Abdomen is soft.  Musculoskeletal:     Cervical back: Normal range of motion.     Comments: Examination of bilateral hand shows no obvious swelling or deformity.  She has a positive Phalen's at about 5 seconds on the right.  Negative Phalen's on the left.  She has a mildly positive Tinel's at the wrist on the right.  Negative on the left.  Fairly good grip strength bilaterally.  She is neurovascularly intact distally bilaterally.    Skin:    General: Skin is warm and dry.  Neurological:     General: No focal deficit present.     Mental Status: She is alert and oriented to person, place, and  time. Mental status is at baseline.  Psychiatric:        Mood and Affect: Mood normal.        Behavior: Behavior normal.        Thought Content: Thought content normal.        Judgment: Judgment normal.      Assessment/Plan Assessment:  Right carpal tunnel syndrome with NCS 2 elsewhere.  Plan: I reviewed the risk of anesthesia, infection, DVT, bleeding related to a right carpal tunnel release.  She is in agreement and would like to proceed.  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  She would like Dr. Roxan Hockey for this as she has worked well with her in the past.  I informed her that she would be in a big bandage for about 7-10 days we will see her back to her stitches.  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  We will have her meet with Lurena Joiner to try and get this set up as soon as safe as possible.  Ginger Organ Alezandra Egli, PA-C 08/14/2023, 8:28 AM

## 2023-08-14 NOTE — Telephone Encounter (Signed)
Pt is calling in asking that the results of the EMG that was done this morning be sent over to [facility] [phone] ([phone]) and/or [phone] ([phone]) if it isn't sent this morning she is not able to have the surgery on 08/18/2023.  She is really hoping that we can get it faxed over before lunch so that she is able to have the surgery.

## 2023-08-14 NOTE — Telephone Encounter (Signed)
Results completed and faxed.

## 2023-08-14 NOTE — Procedures (Signed)
  [facility]  [address], [address]  [place], [place] [address] Tel: [phone] Fax: [phone] Test Date:  08/14/2023  Patient: [name] DOB: [birth year] Physician: Nita Sickle, DO  Sex: Female Height: 5\' 4"  Ref Phys: Marcene Corning, MD  ID#: [id number]   Technician:    History: This is a 32 year old female referred for evaluation of right hand paresthesias.  NCV & EMG Findings: Extensive electrodiagnostic testing of the right upper extremity shows:  Right median and ulnar sensory responses are within normal limits.  Right mixed palmar sensory response shows prolonged latency. Right median and ulnar motor responses are within normal limits. There is no evidence of active or chronic motor axonal loss changes affecting any of the tested muscles.  Motor unit configuration and recruitment pattern is within normal limits.  Impression: Right median neuropathy at or distal to the wrist, consistent with a clinical diagnosis of carpal tunnel syndrome.  Overall, these findings are very mild in degree electrically.   ___________________________ Nita Sickle, DO    Nerve Conduction Studies   Stim Site NR Peak (ms) Norm Peak (ms) O-P Amp (V) Norm O-P Amp  Right Median Anti Sensory (2nd Digit)  32 C  Wrist    3.4 <3.4 31.2 >20  Right Ulnar Anti Sensory (5th Digit)  32 C  Wrist    2.8 <3.1 75.5 >12     Stim Site NR Onset (ms) Norm Onset (ms) O-P Amp (mV) Norm O-P Amp Site1 Site2 Delta-0 (ms) Dist (cm) Vel (m/s) Norm Vel (m/s)  Right Median Motor (Abd Poll Brev)  32 C  Wrist    2.9 <3.9 11.0 >6 Elbow Wrist 5.5 28.0 51 >50  Elbow    8.4  9.2         Right Ulnar Motor (Abd Dig Minimi)  32 C  Wrist    2.3 <3.1 15.6 >7 B Elbow Wrist 3.2 21.0 66 >50  B Elbow    5.5  15.0  A Elbow B Elbow 1.7 10.0 59 >50  A Elbow    7.2  14.8            Stim Site NR Peak (ms) Norm Peak (ms) P-T Amp (V) Site1 Site2 Delta-P (ms) Norm Delta (ms)  Right Median/Ulnar Palm  Comparison (Wrist - 8cm)  32 C  Median Palm    *2.3 <2.2 20.2 Median Palm Ulnar Palm *0.9   Ulnar Palm    1.4 <2.2 20.8       Electromyography   Side Muscle Ins.Act Fibs Fasc Recrt Amp Dur Poly Activation Comment  Right 1stDorInt Nml Nml Nml Nml Nml Nml Nml Nml N/A  Right Abd Poll Brev Nml Nml Nml Nml Nml Nml Nml Nml N/A  Right PronatorTeres Nml Nml Nml Nml Nml Nml Nml Nml N/A  Right Biceps Nml Nml Nml Nml Nml Nml Nml Nml N/A  Right Triceps Nml Nml Nml Nml Nml Nml Nml Nml N/A  Right Deltoid Nml Nml Nml Nml Nml Nml Nml Nml N/A      Waveforms:

## 2023-08-18 ENCOUNTER — Encounter (HOSPITAL_COMMUNITY): Payer: Self-pay | Admitting: Orthopaedic Surgery

## 2023-08-18 ENCOUNTER — Ambulatory Visit (HOSPITAL_COMMUNITY): Payer: BC Managed Care – PPO | Admitting: Anesthesiology

## 2023-08-18 ENCOUNTER — Ambulatory Visit (HOSPITAL_COMMUNITY)
Admission: RE | Admit: 2023-08-18 | Payer: BC Managed Care – PPO | Source: Home / Self Care | Admitting: Orthopaedic Surgery

## 2023-08-18 ENCOUNTER — Encounter (HOSPITAL_COMMUNITY): Admission: RE | Payer: Self-pay | Source: Home / Self Care

## 2023-08-18 ENCOUNTER — Ambulatory Visit (HOSPITAL_COMMUNITY)
Admission: RE | Admit: 2023-08-18 | Discharge: 2023-08-18 | Disposition: A | Discharge status: Home / Self Care | Payer: BC Managed Care – PPO | Attending: Orthopaedic Surgery | Admitting: Orthopaedic Surgery

## 2023-08-18 ENCOUNTER — Encounter (HOSPITAL_COMMUNITY)
Admission: RE | Disposition: A | Discharge status: Home / Self Care | Payer: Self-pay | Source: Home / Self Care | Attending: Orthopaedic Surgery

## 2023-08-18 DIAGNOSIS — Z681 Body mass index (BMI) 19 or less, adult: Secondary | ICD-10-CM | POA: Diagnosis not present

## 2023-08-18 DIAGNOSIS — J45909 Unspecified asthma, uncomplicated: Secondary | ICD-10-CM | POA: Insufficient documentation

## 2023-08-18 DIAGNOSIS — Z823 Family history of stroke: Secondary | ICD-10-CM | POA: Insufficient documentation

## 2023-08-18 DIAGNOSIS — Z8673 Personal history of transient ischemic attack (TIA), and cerebral infarction without residual deficits: Secondary | ICD-10-CM | POA: Insufficient documentation

## 2023-08-18 DIAGNOSIS — G5601 Carpal tunnel syndrome, right upper limb: Secondary | ICD-10-CM | POA: Diagnosis not present

## 2023-08-18 DIAGNOSIS — R634 Abnormal weight loss: Secondary | ICD-10-CM | POA: Insufficient documentation

## 2023-08-18 DIAGNOSIS — G40409 Other generalized epilepsy and epileptic syndromes, not intractable, without status epilepticus: Secondary | ICD-10-CM | POA: Insufficient documentation

## 2023-08-18 DIAGNOSIS — F419 Anxiety disorder, unspecified: Secondary | ICD-10-CM | POA: Diagnosis not present

## 2023-08-18 HISTORY — PX: CARPAL TUNNEL RELEASE: SHX101

## 2023-08-18 SURGERY — CARPAL TUNNEL RELEASE
Anesthesia: General | Laterality: Right

## 2023-08-18 SURGERY — CARPAL TUNNEL RELEASE
Anesthesia: Monitor Anesthesia Care | Site: Wrist | Laterality: Right

## 2023-08-18 MED ORDER — BUPIVACAINE HCL (PF) 0.25 % IJ SOLN
INTRAMUSCULAR | Status: DC | PRN
  Administered 2023-08-18: 2.5 mL

## 2023-08-18 MED ORDER — BUPIVACAINE-EPINEPHRINE 0.25% -1:200000 IJ SOLN
INTRAMUSCULAR | Status: AC
  Filled 2023-08-18: qty 1

## 2023-08-18 MED ORDER — FENTANYL CITRATE PF 50 MCG/ML IJ SOSY
PREFILLED_SYRINGE | INTRAMUSCULAR | Status: AC
  Filled 2023-08-18: qty 1

## 2023-08-18 MED ORDER — CHLORHEXIDINE GLUCONATE 0.12 % MT SOLN
15.0000 mL | Freq: Once | OROMUCOSAL | Status: AC
  Administered 2023-08-18: 15 mL via OROMUCOSAL

## 2023-08-18 MED ORDER — LACTATED RINGERS IV SOLN: INTRAVENOUS | Status: DC

## 2023-08-18 MED ORDER — DEXMEDETOMIDINE HCL IN NACL 80 MCG/20ML IV SOLN
INTRAVENOUS | Status: DC | PRN
Start: 2023-08-18 — End: 2023-08-18
  Administered 2023-08-18: 20 ug via INTRAVENOUS
  Administered 2023-08-18: 8 ug via INTRAVENOUS
  Administered 2023-08-18: 4 ug via INTRAVENOUS
  Administered 2023-08-18: 8 ug via INTRAVENOUS

## 2023-08-18 MED ORDER — MIDAZOLAM HCL 2 MG/2ML IJ SOLN
INTRAMUSCULAR | Status: AC
  Filled 2023-08-18: qty 2

## 2023-08-18 MED ORDER — FENTANYL CITRATE (PF) 100 MCG/2ML IJ SOLN
INTRAMUSCULAR | Status: AC
  Filled 2023-08-18: qty 2

## 2023-08-18 MED ORDER — ACETAMINOPHEN 10 MG/ML IV SOLN
INTRAVENOUS | Status: AC
  Filled 2023-08-18: qty 100

## 2023-08-18 MED ORDER — GENTAMICIN SULFATE 40 MG/ML IJ SOLN
5.0000 mg/kg | INTRAVENOUS | Status: AC
  Administered 2023-08-18: 220 mg via INTRAVENOUS
  Filled 2023-08-18: qty 5.5

## 2023-08-18 MED ORDER — ONDANSETRON HCL 4 MG/2ML IJ SOLN
INTRAMUSCULAR | Status: DC | PRN
  Administered 2023-08-18: 4 mg via INTRAVENOUS

## 2023-08-18 MED ORDER — LIDOCAINE HCL (PF) 1 % IJ SOLN
INTRAMUSCULAR | Status: DC | PRN
  Administered 2023-08-18: 2.5 mL

## 2023-08-18 MED ORDER — FENTANYL CITRATE PF 50 MCG/ML IJ SOSY: 25.0000 ug | PREFILLED_SYRINGE | INTRAMUSCULAR | Status: DC | PRN

## 2023-08-18 MED ORDER — PROPOFOL 500 MG/50ML IV EMUL
INTRAVENOUS | Status: DC | PRN
  Administered 2023-08-18: 125 ug/kg/min via INTRAVENOUS

## 2023-08-18 MED ORDER — FENTANYL CITRATE PF 50 MCG/ML IJ SOSY: 50.0000 ug | PREFILLED_SYRINGE | INTRAMUSCULAR | Status: DC

## 2023-08-18 MED ORDER — PROPOFOL 1000 MG/100ML IV EMUL
INTRAVENOUS | Status: AC
  Filled 2023-08-18: qty 100

## 2023-08-18 MED ORDER — HEPARIN SOD (PORK) LOCK FLUSH 100 UNIT/ML IV SOLN
500.0000 [IU] | Freq: Once | INTRAVENOUS | Status: AC
  Administered 2023-08-18: 500 [IU] via INTRAVENOUS

## 2023-08-18 MED ORDER — POVIDONE-IODINE 10 % EX SWAB: 2.0000 | Freq: Once | CUTANEOUS | Status: DC

## 2023-08-18 MED ORDER — MIDAZOLAM HCL 2 MG/2ML IJ SOLN: 1.0000 mg | INTRAMUSCULAR | Status: DC

## 2023-08-18 MED ORDER — ONDANSETRON HCL 4 MG/2ML IJ SOLN
INTRAMUSCULAR | Status: AC
  Filled 2023-08-18: qty 2

## 2023-08-18 MED ORDER — ORAL CARE MOUTH RINSE: 15.0000 mL | Freq: Once | OROMUCOSAL | Status: AC

## 2023-08-18 MED ORDER — 0.9 % SODIUM CHLORIDE (POUR BTL) OPTIME
TOPICAL | Status: DC | PRN
  Administered 2023-08-18: 1000 mL

## 2023-08-18 MED ORDER — ACETAMINOPHEN 10 MG/ML IV SOLN
1000.0000 mg | Freq: Once | INTRAVENOUS | Status: AC
  Administered 2023-08-18: 1000 mg via INTRAVENOUS

## 2023-08-18 MED ORDER — HEPARIN SOD (PORK) LOCK FLUSH 100 UNIT/ML IV SOLN
INTRAVENOUS | Status: AC
  Filled 2023-08-18: qty 5

## 2023-08-18 MED ORDER — MIDAZOLAM HCL 2 MG/2ML IJ SOLN
INTRAMUSCULAR | Status: DC | PRN
Start: 2023-08-18 — End: 2023-08-18
  Administered 2023-08-18: 4 mg via INTRAVENOUS

## 2023-08-18 MED ORDER — VANCOMYCIN HCL IN DEXTROSE 1-5 GM/200ML-% IV SOLN: 1000.0000 mg | INTRAVENOUS | Status: DC

## 2023-08-18 MED ORDER — LIDOCAINE-EPINEPHRINE (PF) 1 %-1:200000 IJ SOLN
INTRAMUSCULAR | Status: AC
  Filled 2023-08-18: qty 30

## 2023-08-18 SURGICAL SUPPLY — 60 items
BAG COUNTER SPONGE SURGICOUNT (BAG) IMPLANT
BAG SPNG CNTER NS LX DISP (BAG)
BLADE MINI RND TIP GREEN BEAV (BLADE) IMPLANT
BLADE SURG 15 STRL LF DISP TIS (BLADE) ×1 IMPLANT
BLADE SURG 15 STRL SS (BLADE) ×1
BNDG CMPR 5X2 KNTD ELC UNQ LF (GAUZE/BANDAGES/DRESSINGS)
BNDG CMPR 5X4 KNIT ELC UNQ LF (GAUZE/BANDAGES/DRESSINGS) ×1
BNDG CMPR 9X4 STRL LF SNTH (GAUZE/BANDAGES/DRESSINGS) ×1
BNDG CMPR MD 5X2 ELC HKLP STRL (GAUZE/BANDAGES/DRESSINGS) ×1
BNDG COHESIVE 1X5 TAN STRL LF (GAUZE/BANDAGES/DRESSINGS) IMPLANT
BNDG ELASTIC 2 VLCR STRL LF (GAUZE/BANDAGES/DRESSINGS) IMPLANT
BNDG ELASTIC 2INX 5YD STR LF (GAUZE/BANDAGES/DRESSINGS) IMPLANT
BNDG ELASTIC 4INX 5YD STR LF (GAUZE/BANDAGES/DRESSINGS) ×1 IMPLANT
BNDG ESMARK 4X9 LF (GAUZE/BANDAGES/DRESSINGS) ×1 IMPLANT
BNDG GAUZE DERMACEA FLUFF 4 (GAUZE/BANDAGES/DRESSINGS) ×1 IMPLANT
BNDG GZE DERMACEA 4 6PLY (GAUZE/BANDAGES/DRESSINGS) ×1
BNDG PLASTER X FAST 3X3 WHT LF (CAST SUPPLIES) IMPLANT
BNDG PLSTR 9X3 FST ST WHT (CAST SUPPLIES)
CORD BIPOLAR FORCEPS 12FT (ELECTRODE) ×1 IMPLANT
COVER BACK TABLE 60X90IN (DRAPES) ×1 IMPLANT
COVER MAYO STAND STRL (DRAPES) ×1 IMPLANT
CUFF TOURN SGL QUICK 18X4 (TOURNIQUET CUFF) IMPLANT
DRAPE EXTREMITY T 121X128X90 (DISPOSABLE) ×1 IMPLANT
DRAPE SURG 17X23 STRL (DRAPES) ×1 IMPLANT
DRAPE U-SHAPE 47X51 STRL (DRAPES) ×1 IMPLANT
DRSG ADAPTIC 3X8 NADH LF (GAUZE/BANDAGES/DRESSINGS) IMPLANT
DRSG EMULSION OIL 3X3 NADH (GAUZE/BANDAGES/DRESSINGS) ×1 IMPLANT
DURAPREP 26ML APPLICATOR (WOUND CARE) ×1 IMPLANT
ELECT NDL TIP 2.8 STRL (NEEDLE) IMPLANT
ELECT NEEDLE TIP 2.8 STRL (NEEDLE) IMPLANT
ELECT REM PT RETURN 15FT ADLT (MISCELLANEOUS) ×1 IMPLANT
GAUZE PAD ABD 8X10 STRL (GAUZE/BANDAGES/DRESSINGS) IMPLANT
GAUZE SPONGE 4X4 12PLY STRL (GAUZE/BANDAGES/DRESSINGS) ×1 IMPLANT
GLOVE BIO SURGEON STRL SZ8 (GLOVE) ×2 IMPLANT
GLOVE BIOGEL PI IND STRL 7.0 (GLOVE) ×1 IMPLANT
GLOVE BIOGEL PI IND STRL 8 (GLOVE) ×2 IMPLANT
GLOVE SURG SYN 7.0 (GLOVE) ×1 IMPLANT
GLOVE SURG SYN 7.0 PF PI (GLOVE) ×1 IMPLANT
GOWN SRG XL LVL 4 BRTHBL STRL (GOWNS) ×1 IMPLANT
GOWN STRL NON-REIN XL LVL4 (GOWNS) ×1
GOWN STRL REUS W/ TWL XL LVL3 (GOWN DISPOSABLE) ×2 IMPLANT
GOWN STRL REUS W/TWL XL LVL3 (GOWN DISPOSABLE) ×2
KIT BASIN OR (CUSTOM PROCEDURE TRAY) ×1 IMPLANT
KIT TURNOVER KIT A (KITS) IMPLANT
LOOP VESSEL MAXI BLUE (MISCELLANEOUS) IMPLANT
NDL HYPO 25X1 1.5 SAFETY (NEEDLE) IMPLANT
NEEDLE HYPO 25X1 1.5 SAFETY (NEEDLE) IMPLANT
NS IRRIG 1000ML POUR BTL (IV SOLUTION) ×1 IMPLANT
PAD CAST 4YDX4 CTTN HI CHSV (CAST SUPPLIES) ×1 IMPLANT
PADDING CAST ABS COTTON 4X4 ST (CAST SUPPLIES) IMPLANT
PADDING CAST COTTON 4X4 STRL (CAST SUPPLIES) ×1
PENCIL SMOKE EVACUATOR (MISCELLANEOUS) IMPLANT
STOCKINETTE 4X48 STRL (DRAPES) ×1 IMPLANT
SUT ETHILON 3 0 PS 1 (SUTURE) ×1 IMPLANT
SUT ETHILON 4 0 PS 2 18 (SUTURE) ×1 IMPLANT
SUT VIC AB 4-0 P-3 18XBRD (SUTURE) IMPLANT
SUT VIC AB 4-0 P3 18 (SUTURE)
SYR CONTROL 10ML LL (SYRINGE) IMPLANT
TOWEL OR 17X26 10 PK STRL BLUE (TOWEL DISPOSABLE) ×1 IMPLANT
UNDERPAD 30X36 HEAVY ABSORB (UNDERPADS AND DIAPERS) ×1 IMPLANT

## 2023-08-18 NOTE — Interval H&P Note (Signed)
History and Physical Interval Note:  08/18/2023 11:01 AM  [name]  has presented today for surgery, with the diagnosis of RIGHT CARPEL TUNNEL SYNDROME.  The various methods of treatment have been discussed with the patient and family. After consideration of risks, benefits and other options for treatment, the patient has consented to  Procedure(s): CARPAL TUNNEL RELEASE (Right) as a surgical intervention.  The patient's history has been reviewed, patient examined, no change in status, stable for surgery.  I have reviewed the patient's chart and labs.  Questions were answered to the patient's satisfaction.     Velna Ochs

## 2023-08-18 NOTE — Transfer of Care (Signed)
Immediate Anesthesia Transfer of Care Note  Patient: [name]  Procedure(s) Performed: CARPAL TUNNEL RELEASE (Right: Wrist)  Patient Location: PACU  Anesthesia Type:Mac  Level of Consciousness: drowsy  Airway & Oxygen Therapy: Patient Spontanous Breathing and Patient connected to face mask oxygen  Post-op Assessment: Report given to RN and Post -op Vital signs reviewed and stable  Post vital signs: Reviewed and stable  Last Vitals:  Vitals Value Taken Time  BP 89/52 08/18/23 1235  Temp    Pulse 47 08/18/23 1235  Resp 15 08/18/23 1235  SpO2 100 % 08/18/23 1235  Vitals shown include unfiled device data.  Last Pain:  Vitals:   08/18/23 1031  TempSrc:   PainSc: 0-No pain         Complications: No notable events documented.

## 2023-08-18 NOTE — Anesthesia Procedure Notes (Signed)
Procedure Name: MAC Date/Time: 08/18/2023 12:00 PM  Performed by: Vanessa [place], CRNAPre-anesthesia Checklist: Patient identified, Emergency Drugs available, Suction available and Patient being monitored Patient Re-evaluated:Patient Re-evaluated prior to induction Oxygen Delivery Method: Simple face mask

## 2023-08-18 NOTE — Anesthesia Preprocedure Evaluation (Signed)
Anesthesia Evaluation  Patient identified by MRN, date of birth, ID band Patient awake    Reviewed: Allergy & Precautions, NPO status , Patient's Chart, lab work & pertinent test results  History of Anesthesia Complications (+) PONV and history of anesthetic complications (epilepsy poorly controlled- has seizures after most anesthetics)  Airway Mallampati: III  TM Distance: >3 FB Neck ROM: Full    Dental  (+) Teeth Intact, Dental Advisory Given   Pulmonary asthma    Pulmonary exam normal breath sounds clear to auscultation       Cardiovascular negative cardio ROS Normal cardiovascular exam Rhythm:Regular Rate:Normal  TTE 2019 - Left ventricle: The cavity size was normal. Systolic function was    normal. The estimated ejection fraction was in the range of 60%    to 65%. Wall motion was normal; there were no regional wall    motion abnormalities. Left ventricular diastolic function    parameters were normal.  - Aortic valve: Transvalvular velocity was within the normal range.    There was no stenosis. There was no regurgitation.  - Mitral valve: Transvalvular velocity was within the normal range.    There was no evidence for stenosis. There was no regurgitation.  - Right ventricle: The cavity size was normal. Wall thickness was    normal. Systolic function was normal.  - Pulmonary arteries: Systolic pressure was within the normal    range. PA peak pressure: 20 mm Hg (S).     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 - no missed doses, seizes after most anesthetics CVA    GI/Hepatic Neg liver ROS,GERD  Controlled,,Weight loss, N/V   Endo/Other  negative endocrine ROS    Renal/GU negative Renal ROS  negative genitourinary   Musculoskeletal negative musculoskeletal ROS (+)    Abdominal   Peds   Hematology negative hematology ROS (+)   Anesthesia Other Findings   Reproductive/Obstetrics negative OB ROS                             Anesthesia Physical Anesthesia Plan  ASA: 3  Anesthesia Plan: MAC   Post-op Pain Management: Precedex and Minimal or no pain anticipated   Induction: Intravenous  PONV Risk Score and Plan: 3 and Midazolam and Propofol infusion  Airway Management Planned: Natural Airway  Additional Equipment: None  Intra-op Plan:   Post-operative Plan:   Informed Consent: I have reviewed the patients History and Physical, chart, labs and discussed the procedure including the risks, benefits and alternatives for the proposed anesthesia with the patient or authorized representative who has indicated his/her understanding and acceptance.     Dental advisory given  Plan Discussed with: CRNA  Anesthesia Plan Comments:        Anesthesia Quick Evaluation

## 2023-08-18 NOTE — Progress Notes (Signed)
PAC deaccessed via PACU

## 2023-08-18 NOTE — Op Note (Signed)
PRE-OP DIAGNOSIS:  right carpal tunnel syndrome POST-OP DIAGNOSIS:  same PROCEDURE:  right carpal tunnel release ANESTHESIA:  local and MAC ATTENDING SURGEON:  Marcene Corning MD ASSISTANT:  Elodia Florence PA  INDICATION FOR PROCEDURE:  [name] is a 32 y.o. female with a long history of hand pain and numbness.  The patient has failed conservative measures of treatment and persists with symptoms which are very limiting.  The patient is offered carpal tunnel release in hopes of improving their situation by relieving pressure on the medial nerve.  Informed operative consent was obtained after discussion of possible complications including reaction to anesthesia, infection, and neurovascular injury.  SUMMARY OF FINDINGS AND PROCEDURE:  Under to above anesthetic a carpal tunnel release was performed.  The patient had a moderately  thickened transverse carpal tunnel ligament which was applying compression to the underlying medial nerve.  This was release under direct visualization.  The skin was closed primarily followed by placement of a sterile dressing and splint with the wrist in slight extension.  DESCRIPTION OF PROCEDURE:  [name] was taken to an operative suite where the above anesthetic was applied.  The patient was then prepped and draped in normal sterile fashion.  An appropriate time out was performed.  After the administration of gentamycin pre-operative antibiotic and application and inflation of a tourniquet a small incision was made on the palmar aspect of the palm.  This did not cross the wrist flexion crease and was ulnar to the thenar flexion crease.  Dissection was carried down through palmar fascia to expose the transverse carpal ligament which was released under direct visualization.  The dissection was taken distally towards the transverse arch of vessels and distally through the distal fascia of the forearm.  The wound was irrigated and the skin was closed with nylon.  The  tourniquet was deflated and skin edges became pink immediately and pulses were intact.  Adaptic was applied along with a sterile dressing and a plaster splint with the wrist in slight extension.  Estimated blood loss and intraoperative fluids can be obtained from anesthesia records as can tourniquet time.  DISPOSITION:  [name] was taken to recovery room in stable condition.  Plans were to go home same day and I will contact the patient by phone tonight.  The patient will follow-up in about a week in the office.  [name] 08/18/2023, 12:18 PM

## 2023-08-18 NOTE — Anesthesia Postprocedure Evaluation (Signed)
Anesthesia Post Note  Patient: [name]  Procedure(s) Performed: CARPAL TUNNEL RELEASE (Right: Wrist)     Patient location during evaluation: PACU Anesthesia Type: MAC Level of consciousness: awake and alert Pain management: pain level controlled Vital Signs Assessment: post-procedure vital signs reviewed and stable Respiratory status: spontaneous breathing, nonlabored ventilation, respiratory function stable and patient connected to nasal cannula oxygen Cardiovascular status: stable and blood pressure returned to baseline Postop Assessment: no apparent nausea or vomiting Anesthetic complications: no  No notable events documented.  Last Vitals:  Vitals:   08/18/23 1400 08/18/23 1415  BP: (!) 90/59 96/68  Pulse: (!) 39 (!) 55  Resp: 13 16  Temp:  36.5 C  SpO2: 98% 99%    Last Pain:  Vitals:   08/18/23 1415  TempSrc:   PainSc: 3                  Kayela Humphres L Brookelyn Gaynor

## 2023-08-19 ENCOUNTER — Encounter (HOSPITAL_COMMUNITY): Payer: Self-pay | Admitting: Orthopaedic Surgery

## 2023-08-19 NOTE — Progress Notes (Signed)
[facility] Summary Report / Loop Recorder 

## 2023-08-24 DIAGNOSIS — F4312 Post-traumatic stress disorder, chronic: Secondary | ICD-10-CM | POA: Diagnosis not present

## 2023-08-26 DIAGNOSIS — F4312 Post-traumatic stress disorder, chronic: Secondary | ICD-10-CM | POA: Diagnosis not present

## 2023-08-27 DIAGNOSIS — F4312 Post-traumatic stress disorder, chronic: Secondary | ICD-10-CM | POA: Diagnosis not present

## 2023-08-28 DIAGNOSIS — F4312 Post-traumatic stress disorder, chronic: Secondary | ICD-10-CM | POA: Diagnosis not present

## 2023-08-31 DIAGNOSIS — F4312 Post-traumatic stress disorder, chronic: Secondary | ICD-10-CM | POA: Diagnosis not present

## 2023-09-01 DIAGNOSIS — F4312 Post-traumatic stress disorder, chronic: Secondary | ICD-10-CM | POA: Diagnosis not present

## 2023-09-02 DIAGNOSIS — F4312 Post-traumatic stress disorder, chronic: Secondary | ICD-10-CM | POA: Diagnosis not present

## 2023-09-03 DIAGNOSIS — F4312 Post-traumatic stress disorder, chronic: Secondary | ICD-10-CM | POA: Diagnosis not present

## 2023-09-04 DIAGNOSIS — F4312 Post-traumatic stress disorder, chronic: Secondary | ICD-10-CM | POA: Diagnosis not present

## 2023-09-07 ENCOUNTER — Encounter: Payer: Self-pay | Admitting: Neurology

## 2023-09-10 ENCOUNTER — Telehealth: Payer: BC Managed Care – PPO | Admitting: Adult Health

## 2023-09-10 ENCOUNTER — Telehealth: Payer: Self-pay | Admitting: Neurology

## 2023-09-10 DIAGNOSIS — F509 Eating disorder, unspecified: Secondary | ICD-10-CM | POA: Diagnosis not present

## 2023-09-10 DIAGNOSIS — F4310 Post-traumatic stress disorder, unspecified: Secondary | ICD-10-CM

## 2023-09-10 DIAGNOSIS — F419 Anxiety disorder, unspecified: Secondary | ICD-10-CM | POA: Diagnosis not present

## 2023-09-10 NOTE — Telephone Encounter (Signed)
Patient is needing a letter for a appeal, and requesting a call back . They think a letter from Dr.Aquino will help her and is very important

## 2023-09-10 NOTE — Progress Notes (Signed)
Virtual Visit via Video Note  I connected with [name]  on 09/10/23 at  9:30 AM EDT by a video enabled telemedicine application and verified that I am speaking with the correct person using two identifiers.  Location patient: home Location provider:work or home office Persons participating in the virtual visit: patient, provider  I discussed the limitations of evaluation and management by telemedicine and the availability of in person appointments. The patient expressed understanding and agreed to proceed.   HPI: She reports that she has having issues with her insurance company reimbursing her for her therapy appointments.  She does extended sessions of 90 to 120 minutes with her therapist 3 times a week to history of PTSD, eating disorder, autism anxiety and sleep disorder.  Her therapist works well with her in order to keep her symptoms under control so that she can sleep and reduce her incidence of seizure activity.   Insurance company advised her to get letters from her providers on if the providers feel that it is a need to have extended therapy sessions.   ROS: See pertinent positives and negatives per HPI.  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   Gallstones    GERD (gastroesophageal reflux disease)    H/O absence seizures    Head injury, intracranial, with concussion    Hearing loss    both ears   History of asthma    no current med.    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)    Migraines    Pneumonia 12/2018   hx of    PONV (postoperative nausea and vomiting)    PTSD (post-traumatic stress disorder)    Seizures (HCC)    complex partial   Shoulder dislocation 09/2018   Left   Shoulder subluxation, left    Syncope    Tremor, unspecified    bilateral arms, comes and goes   Wears glasses    Wears hearing aid    bilateral    Past Surgical History:  Procedure Laterality Date   ABDOMINAL HYSTERECTOMY     APPENDECTOMY     BIOPSY  10/20/2022   Procedure: BIOPSY;  Surgeon: Tressia Danas, MD;  Location: [facility];  Service: Gastroenterology;;   Fidela Salisbury RELEASE Right 08/18/2023   Procedure: CARPAL TUNNEL RELEASE;  Surgeon: Marcene Corning, MD;  Location: [facility];  Service: Orthopedics;  Laterality: Right;   CHOLECYSTECTOMY N/A 10/05/2018   Procedure: LAPAROSCOPIC CHOLECYSTECTOMY WITH INTRAOPERATIVE CHOLANGIOGRAM ERAS PATHWAY;  Surgeon: Ovidio Kin, MD;  Location: [facility];  Service: General;  Laterality: N/A;   CYSTOSCOPY N/A 03/12/2021   Procedure: CYSTOSCOPY;  Surgeon: Edwinna Areola, DO;  Location: [facility];  Service: Gynecology;  Laterality: N/A;   ESOPHAGOGASTRODUODENOSCOPY (EGD) WITH PROPOFOL N/A 10/20/2022   Procedure: ESOPHAGOGASTRODUODENOSCOPY (EGD) WITH PROPOFOL;  Surgeon: Tressia Danas, MD;  Location: [facility];  Service: Gastroenterology;  Laterality: N/A;   LAPAROSCOPIC APPENDECTOMY  11/26/2011   Procedure: APPENDECTOMY LAPAROSCOPIC;  Surgeon: Atilano Ina, MD;  [facility];  Service: General;  Laterality: N/A;   left shoulder manipulation     in er multilple times last done july 2021   MASTECTOMY     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   NIPPLE SPARING MASTECTOMY Bilateral 02/25/2022   Procedure: BILATERAL NIPPLE-SPARING MASTECTOMY;  Surgeon: Almond Lint, MD;   Location: [facility];  Service: General;  Laterality: Bilateral;   PORT-A-CATH REMOVAL N/A 12/10/2021   Procedure: REMOVAL PORT-A-CATH AND REPLACEMENT;  Surgeon: Fritzi Mandes, MD;  Location: [facility];  Service: General;  Laterality: N/A;   PORTACATH PLACEMENT Right 08/28/2017   Procedure: POWER PORT PLACEMENT;  Surgeon: Ovidio Kin, MD;  Location:  SURGERY CENTER;  Service: General;  Laterality: Right;   PORTACATH PLACEMENT N/A 12/04/2017   Procedure: INSERTION PORT-A-CATH;  Surgeon: Ovidio Kin, MD;  Location: [facility] OR;  Service: General;  Laterality: N/A;   RADIOLOGY WITH ANESTHESIA Left 10/25/2019   Procedure: MRI  LEFT SHOLDER WITHOUT CONTRAST;  Surgeon: Radiologist, Medication, MD;  Location: [facility];  Service: Radiology;  Laterality: Left;   RADIOLOGY WITH ANESTHESIA Bilateral 11/19/2021   Procedure: MRI WITH ANESTHESIA OF BILATERAL BREASTS WITH AND WITHOUT CONTRAST;  Surgeon: Radiologist, Medication, MD;  Location: [facility];  Service: Radiology;  Laterality: Bilateral;   RADIOLOGY WITH ANESTHESIA N/A 02/24/2023   Procedure: MRI LEFT SHOULDER WITHOUT CONTRAST WITH ANESTHESIA;  Surgeon: Radiologist, Medication, MD;  Location: [facility];  Service: Radiology;  Laterality: N/A;   REPAIR ANKLE LIGAMENT Left    x 3   TOTAL LAPAROSCOPIC HYSTERECTOMY WITH SALPINGECTOMY Bilateral 03/12/2021   Procedure: TOTAL LAPAROSCOPIC HYSTERECTOMY WITH SALPINGECTOMY;  Surgeon: Edwinna Areola, DO;  Location: [facility];  Service: Gynecology;  Laterality: Bilateral;    Family History  Problem Relation Age of Onset   Polymyalgia rheumatica Mother    Heart disease Mother    Sudden Cardiac Death Father        long QT syndrome    Heart disease Maternal Grandmother    Breast cancer Maternal Grandmother    Stomach cancer Maternal Grandmother    Heart disease Maternal Grandfather    Stroke Maternal Grandfather    Diabetes Maternal Grandfather    COPD Maternal Grandfather    Kidney failure Maternal Grandfather     Colon cancer Paternal Grandmother 40   Heart disease Paternal Grandmother    Heart disease Paternal Grandfather    Migraines Paternal Grandfather    Esophageal cancer Cousin    Pancreatic cancer Neg Hx        Current Outpatient Medications:    acetaminophen (TYLENOL) 325 MG tablet, Take 2 tablets (650 mg total) by mouth every 6 (six) hours as needed (pain)., Disp: , Rfl:    albuterol (VENTOLIN HFA) 108 (90 Base) MCG/ACT inhaler, Inhale 2 puffs into the lungs every 6 (six) hours as needed for wheezing or shortness of breath., Disp: 6.7 g, Rfl: 3   buPROPion (WELLBUTRIN XL) 300 MG 24 hr tablet, Take 300 mg by mouth 2 (two) times daily., Disp: , Rfl:    busPIRone (BUSPAR) 15 MG tablet, Take 15 mg by mouth 2 (two) times daily., Disp: ,  Rfl:    Continuous Blood Gluc Sensor (FREESTYLE LIBRE 2 SENSOR) MISC, USE WITH LIBRE APP, Disp: 6 each, Rfl: 2   Continuous Blood Gluc Transmit (DEXCOM G6 TRANSMITTER) MISC, Use with dexcom receiver and sensor, Disp: 1 each, Rfl: 3   fluticasone furoate-vilanterol (BREO ELLIPTA) 100-25 MCG/ACT AEPB, Inhale 1 puff into the lungs daily., Disp: 1 each, Rfl: 11   gabapentin (NEURONTIN) 100 MG capsule, Take 100 mg by mouth at bedtime., Disp: , Rfl:    Glucagon, rDNA, (GLUCAGON EMERGENCY) 1 MG KIT, Use PRN for hypoglycemia, Disp: 1 kit, Rfl: 3   glucose monitoring kit (FREESTYLE) monitoring kit, 1 each by Does not apply route as needed for other. Free style libre on right arm, Disp: , Rfl:    Lacosamide (VIMPAT) 100 MG TABS, Take 1 tablet in AM, 2 tablets in PM, Disp: 270 tablet, Rfl: 3   LORazepam (ATIVAN) 0.5 MG tablet, Take 1 tablet as needed for seizure. Do not take more than 2-3 a week., Disp: 10 tablet, Rfl: 5   montelukast (SINGULAIR) 10 MG tablet, TAKE 1 TABLET BY MOUTH EVERYDAY AT BEDTIME, Disp: 90 tablet, Rfl: 3   MOTEGRITY 2 MG TABS, TAKE 1 TABLET BY MOUTH EVERY DAY, Disp: 30 tablet, Rfl: 5   perampanel (FYCOMPA) 8 MG tablet, Take 1 tablet (8 mg total) by  mouth at bedtime. Take 1 tablet every night, Disp: 90 tablet, Rfl: 3   topiramate (TOPAMAX) 100 MG tablet, Take 1 tablet (100 mg total) by mouth 2 (two) times daily., Disp: 180 tablet, Rfl: 3  Current Facility-Administered Medications:    alteplase (CATHFLO ACTIVASE) injection 2 mg, 2 mg, Intracatheter, Once, Kinan Safley, Kandee Keen, NP  EXAM:  VITALS per patient if applicable:  GENERAL: alert, oriented, appears well and in no acute distress  HEENT: atraumatic, conjunttiva clear, no obvious abnormalities on inspection of external nose and ears  NECK: normal movements of the head and neck  LUNGS: on inspection no signs of respiratory distress, breathing rate appears normal, no obvious gross SOB, gasping or wheezing  CV: no obvious cyanosis  MS: moves all visible extremities without noticeable abnormality  PSYCH/NEURO: pleasant and cooperative, no obvious depression or anxiety, speech and thought processing grossly intact  ASSESSMENT AND PLAN:  Discussed the following assessment and plan:  1. Anxiety -I will write her a letter.  Will let her know when letter is completed and signed copy will be placed at the front desk.  2. PTSD (post-traumatic stress disorder)   3. Eating disorder, unspecified type       I discussed the assessment and treatment plan with the patient. The patient was provided an opportunity to ask questions and all were answered. The patient agreed with the plan and demonstrated an understanding of the instructions.   The patient was advised to call back or seek an in-person evaluation if the symptoms worsen or if the condition fails to improve as anticipated.   Shirline Frees, NP

## 2023-09-10 NOTE — Telephone Encounter (Signed)
Pt called to see what the letter of appeal needed to be on? When she calls back can we please get this information

## 2023-09-11 NOTE — Telephone Encounter (Signed)
Pt called in returning Heather's call. She stated her letter of appeal for her insurance company for the extended length therapy sessions. They were paying for them without issue until 2023 when her provider switched from a larger practice to working for herself. She sees her therapist for her complex PTSD. She has had to stop having the sessions and have been having a lot of negative symptoms. She was wanting to see if Dr. Karel Jarvis could write a letter in support for her to have those sessions.

## 2023-09-11 NOTE — Telephone Encounter (Signed)
Mychart sent to pt.

## 2023-09-11 NOTE — Telephone Encounter (Signed)
Pt called to see what the letter of appeal needed to be on? When she calls back can we please get this information

## 2023-09-12 ENCOUNTER — Other Ambulatory Visit: Payer: Self-pay | Admitting: Adult Health

## 2023-09-15 DIAGNOSIS — F840 Autistic disorder: Secondary | ICD-10-CM | POA: Diagnosis not present

## 2023-09-15 DIAGNOSIS — F4312 Post-traumatic stress disorder, chronic: Secondary | ICD-10-CM | POA: Diagnosis not present

## 2023-09-15 DIAGNOSIS — F50022 Anorexia nervosa, binge eating/purging type, severe: Secondary | ICD-10-CM | POA: Diagnosis not present

## 2023-09-16 ENCOUNTER — Encounter: Payer: Self-pay | Admitting: Adult Health

## 2023-09-16 DIAGNOSIS — G5603 Carpal tunnel syndrome, bilateral upper limbs: Secondary | ICD-10-CM | POA: Diagnosis not present

## 2023-09-17 DIAGNOSIS — F50022 Anorexia nervosa, binge eating/purging type, severe: Secondary | ICD-10-CM | POA: Diagnosis not present

## 2023-09-17 DIAGNOSIS — F840 Autistic disorder: Secondary | ICD-10-CM | POA: Diagnosis not present

## 2023-09-17 DIAGNOSIS — F4312 Post-traumatic stress disorder, chronic: Secondary | ICD-10-CM | POA: Diagnosis not present

## 2023-09-19 DIAGNOSIS — F4312 Post-traumatic stress disorder, chronic: Secondary | ICD-10-CM | POA: Diagnosis not present

## 2023-09-19 DIAGNOSIS — F840 Autistic disorder: Secondary | ICD-10-CM | POA: Diagnosis not present

## 2023-09-19 DIAGNOSIS — F50022 Anorexia nervosa, binge eating/purging type, severe: Secondary | ICD-10-CM | POA: Diagnosis not present

## 2023-09-21 ENCOUNTER — Telehealth: Payer: Self-pay | Admitting: Neurology

## 2023-09-21 ENCOUNTER — Encounter: Payer: Self-pay | Admitting: Neurology

## 2023-09-21 ENCOUNTER — Other Ambulatory Visit: Payer: Self-pay | Admitting: Orthopaedic Surgery

## 2023-09-21 NOTE — Telephone Encounter (Signed)
Pls let her know letter is ready, should it be mailed or faxed? Thanks

## 2023-09-21 NOTE — Telephone Encounter (Signed)
Patient mother called and LVM stating that she has called multiple times , to ask for a letter to be sent to her insurance for approval for counseling . Please give her a call back to discuss more about this [phone]

## 2023-09-22 DIAGNOSIS — F840 Autistic disorder: Secondary | ICD-10-CM | POA: Diagnosis not present

## 2023-09-22 DIAGNOSIS — F4312 Post-traumatic stress disorder, chronic: Secondary | ICD-10-CM | POA: Diagnosis not present

## 2023-09-22 DIAGNOSIS — F50022 Anorexia nervosa, binge eating/purging type, severe: Secondary | ICD-10-CM | POA: Diagnosis not present

## 2023-09-22 NOTE — Telephone Encounter (Signed)
Pt called informed that letter is ready an at the front for her to pick up

## 2023-09-22 NOTE — Telephone Encounter (Signed)
Letter done, thanks.

## 2023-09-23 DIAGNOSIS — F4312 Post-traumatic stress disorder, chronic: Secondary | ICD-10-CM | POA: Diagnosis not present

## 2023-09-23 DIAGNOSIS — F50022 Anorexia nervosa, binge eating/purging type, severe: Secondary | ICD-10-CM | POA: Diagnosis not present

## 2023-09-23 DIAGNOSIS — F840 Autistic disorder: Secondary | ICD-10-CM | POA: Diagnosis not present

## 2023-09-24 DIAGNOSIS — F840 Autistic disorder: Secondary | ICD-10-CM | POA: Diagnosis not present

## 2023-09-24 DIAGNOSIS — F50022 Anorexia nervosa, binge eating/purging type, severe: Secondary | ICD-10-CM | POA: Diagnosis not present

## 2023-09-24 DIAGNOSIS — F4312 Post-traumatic stress disorder, chronic: Secondary | ICD-10-CM | POA: Diagnosis not present

## 2023-10-02 ENCOUNTER — Other Ambulatory Visit: Payer: Self-pay | Admitting: Neurology

## 2023-10-02 DIAGNOSIS — G40019 Localization-related (focal) (partial) idiopathic epilepsy and epileptic syndromes with seizures of localized onset, intractable, without status epilepticus: Secondary | ICD-10-CM

## 2023-10-05 DIAGNOSIS — F50022 Anorexia nervosa, binge eating/purging type, severe: Secondary | ICD-10-CM | POA: Diagnosis not present

## 2023-10-05 DIAGNOSIS — F840 Autistic disorder: Secondary | ICD-10-CM | POA: Diagnosis not present

## 2023-10-05 DIAGNOSIS — F4312 Post-traumatic stress disorder, chronic: Secondary | ICD-10-CM | POA: Diagnosis not present

## 2023-10-06 DIAGNOSIS — F840 Autistic disorder: Secondary | ICD-10-CM | POA: Diagnosis not present

## 2023-10-06 DIAGNOSIS — F50022 Anorexia nervosa, binge eating/purging type, severe: Secondary | ICD-10-CM | POA: Diagnosis not present

## 2023-10-06 DIAGNOSIS — F4312 Post-traumatic stress disorder, chronic: Secondary | ICD-10-CM | POA: Diagnosis not present

## 2023-10-08 ENCOUNTER — Other Ambulatory Visit (HOSPITAL_COMMUNITY): Payer: Self-pay

## 2023-10-09 DIAGNOSIS — F50022 Anorexia nervosa, binge eating/purging type, severe: Secondary | ICD-10-CM | POA: Diagnosis not present

## 2023-10-09 DIAGNOSIS — F840 Autistic disorder: Secondary | ICD-10-CM | POA: Diagnosis not present

## 2023-10-09 DIAGNOSIS — F4312 Post-traumatic stress disorder, chronic: Secondary | ICD-10-CM | POA: Diagnosis not present

## 2023-10-12 DIAGNOSIS — F840 Autistic disorder: Secondary | ICD-10-CM | POA: Diagnosis not present

## 2023-10-12 DIAGNOSIS — F4312 Post-traumatic stress disorder, chronic: Secondary | ICD-10-CM | POA: Diagnosis not present

## 2023-10-12 DIAGNOSIS — F50022 Anorexia nervosa, binge eating/purging type, severe: Secondary | ICD-10-CM | POA: Diagnosis not present

## 2023-10-13 ENCOUNTER — Non-Acute Institutional Stay (HOSPITAL_COMMUNITY)
Admission: RE | Admit: 2023-10-13 | Discharge: 2023-10-13 | Disposition: A | Discharge status: Home / Self Care | Payer: BC Managed Care – PPO | Source: Ambulatory Visit | Attending: Internal Medicine | Admitting: Internal Medicine

## 2023-10-13 DIAGNOSIS — Z95828 Presence of other vascular implants and grafts: Secondary | ICD-10-CM | POA: Diagnosis not present

## 2023-10-13 DIAGNOSIS — F840 Autistic disorder: Secondary | ICD-10-CM | POA: Diagnosis not present

## 2023-10-13 DIAGNOSIS — F4312 Post-traumatic stress disorder, chronic: Secondary | ICD-10-CM | POA: Diagnosis not present

## 2023-10-13 DIAGNOSIS — F50022 Anorexia nervosa, binge eating/purging type, severe: Secondary | ICD-10-CM | POA: Diagnosis not present

## 2023-10-13 LAB — COMPREHENSIVE METABOLIC PANEL
ALT: 25 U/L (ref 0–44)
AST: 24 U/L (ref 15–41)
Albumin: 4 g/dL (ref 3.5–5.0)
Alkaline Phosphatase: 55 U/L (ref 38–126)
Anion gap: 4 — ABNORMAL LOW (ref 5–15)
BUN: 16 mg/dL (ref 6–20)
CO2: 21 mmol/L — ABNORMAL LOW (ref 22–32)
Calcium: 8.6 mg/dL — ABNORMAL LOW (ref 8.9–10.3)
Chloride: 111 mmol/L (ref 98–111)
Creatinine, Ser: 0.69 mg/dL (ref 0.44–1.00)
GFR, Estimated: >60 mL/min (ref >60)
Glucose, Bld: 90 mg/dL (ref 70–99)
Potassium: 3 mmol/L — ABNORMAL LOW (ref 3.5–5.1)
Sodium: 136 mmol/L (ref 135–145)
Total Bilirubin: 0.6 mg/dL (ref 0.3–1.2)
Total Protein: 6.6 g/dL (ref 6.5–8.1)

## 2023-10-13 LAB — CBC WITH DIFFERENTIAL/PLATELET
Abs Immature Granulocytes: 0.02 10*3/uL (ref 0.00–0.07)
Basophils Absolute: 0 10*3/uL (ref 0.0–0.1)
Basophils Relative: 1 %
Eosinophils Absolute: 0.3 10*3/uL (ref 0.0–0.5)
Eosinophils Relative: 5 %
HCT: 37.3 % (ref 36.0–46.0)
Hemoglobin: 12.7 g/dL (ref 12.0–15.0)
Immature Granulocytes: 0 %
Lymphocytes Relative: 31 %
Lymphs Abs: 1.6 10*3/uL (ref 0.7–4.0)
MCH: 30.4 pg (ref 26.0–34.0)
MCHC: 34 g/dL (ref 30.0–36.0)
MCV: 89.2 fL (ref 80.0–100.0)
Monocytes Absolute: 0.4 10*3/uL (ref 0.1–1.0)
Monocytes Relative: 7 %
Neutro Abs: 2.9 10*3/uL (ref 1.7–7.7)
Neutrophils Relative %: 56 %
Platelets: 338 10*3/uL (ref 150–400)
RBC: 4.18 MIL/uL (ref 3.87–5.11)
RDW: 12.7 % (ref 11.5–15.5)
WBC: 5.1 10*3/uL (ref 4.0–10.5)
nRBC: 0 % (ref 0.0–0.2)

## 2023-10-13 MED ORDER — SODIUM CHLORIDE 0.9% FLUSH
10.0000 mL | INTRAVENOUS | Status: AC | PRN
  Administered 2023-10-13: 10 mL

## 2023-10-13 MED ORDER — HEPARIN SOD (PORK) LOCK FLUSH 100 UNIT/ML IV SOLN
500.0000 [IU] | INTRAVENOUS | Status: AC | PRN
  Administered 2023-10-13: 500 [IU]
  Filled 2023-10-13: qty 5

## 2023-10-13 NOTE — Progress Notes (Signed)
PATIENT CARE CENTER NOTE:  Provider: Shirline Frees NP; Marcene Corning MD     Procedure: Port-a-cath flush + labs     Note: Patient's PAC accessed using sterile technique , flushed with 10 cc 0.9% sodium chloride and heparin. Blood return noted. Pt states that she is having sx in Nov., she is supposed to have bloodwork done this Thursday however pt states the lab she normally goes to usually has trouble collecting her blood peripherally, pt inquired if she could have labs drawn from [facility] today. Samantha clinical staff at [facility] notified of pt request. Per provider Dr Jerl Santos, it is ok to collect pre surgical lab from [facility] today since surgery date is close. Labs collected per [facility] without incident. PAC de-accessed and site covered with band aid. Patient tolerated well. Patient declined AVS. Instructed pt to schedule her monthly appointment at front desk prior to leaving, pt verbalized understanding.  Alert, oriented and ambulatory at discharge, accompanied by support person.

## 2023-10-14 NOTE — Progress Notes (Signed)
COVID Vaccine received:  []  No [x]  Yes Date of any COVID positive Test in last 90 days:  PCP - Shirline Frees, NP Cardiologist - Carolan Clines, MD   Jari Favre, PA-C  Clearance 07-28-2023 for both carpal tunnel surgeries.  Neurology- Patrcia Dolly, MD  Chest x-ray -  EKG -  10-13-2022  Epic  will repeat at PST Stress Test -  ECHO - 10-04-2018  Epic Cardiac Cath -   PCR screen: []  Ordered & Completed []   No Order but Needs PROFEND     []   N/A for this surgery  Surgery Plan:  [x]  Ambulatory   []  Outpatient in bed  []  Admit Anesthesia:    []  General  []  Spinal  []   Choice [x]   MAC  Pacemaker / ICD device [x]  No []  Yes  07-03-2023  Loop recorder  - Medtonic ILR Spinal Cord Stimulator:[x]  No []  Yes       History of Sleep Apnea? [x]  No []  Yes   CPAP used?- [x]  No []  Yes    Does the patient monitor blood sugar?   []  N/A   []  No []  Yes  Patient has: []  NO Hx DM   []  Pre-DM   []  DM1  []   DM2 Last A1c was:        on      Does patient have a [employer] or [employer]acom? []  No []  Yes   Fasting Blood Sugar Ranges-  Checks Blood Sugar _____ times a day  Blood Thinner / Instructions:  none Aspirin Instructions:  none  ERAS Protocol Ordered: []  No  [x]  Yes PRE-SURGERY [x]  ENSURE  []  G2    Patient is to be NPO after: 11:15 am  Dental hx: []  Dentures:  []  N/A      []  Bridge or Partial:                   []  Loose or Damaged teeth:   Comments:   Activity level: Patient is able / unable to climb a flight of stairs without difficulty; []  No CP  []  No SOB, but would have ___   Patient can / can not perform ADLs without assistance.   Anesthesia review: cryptogenic stroke, hypoglycemia monitors with dexcom, seizures- MERRF syndrome, Bilateral mastectomies-beast cancer, asthma, loop recorder, port-a-cath, HOH- has HAs,  Syncope- has loop recorder  Patient denies shortness of breath, fever, cough and chest pain at PAT appointment.  Patient verbalized understanding and agreement to the Pre-Surgical  Instructions that were given to them at this PAT appointment. Patient was also educated of the need to review these PAT instructions again prior to her surgery.I reviewed the appropriate phone numbers to call if they have any and questions or concerns.

## 2023-10-14 NOTE — Patient Instructions (Signed)
SURGICAL WAITING ROOM VISITATION Patients having surgery or a procedure may have no more than 2 support people in the waiting area - these visitors may rotate in the visitor waiting room.   Due to an increase in RSV and influenza rates and associated hospitalizations, children ages 53 and under may not visit patients in [facility] hospitals. If the patient needs to stay at the hospital during part of their recovery, the visitor guidelines for inpatient rooms apply.  PRE-OP VISITATION  Pre-op nurse will coordinate an appropriate time for 1 support person to accompany the patient in pre-op.  This support person may not rotate.  This visitor will be contacted when the time is appropriate for the visitor to come back in the pre-op area.  Please refer to the [facility] website for the visitor guidelines for Inpatients (after your surgery is over and you are in a regular room).  You are not required to quarantine at this time prior to your surgery. However, you must do this: Hand Hygiene often Do NOT share personal items Notify your provider if you are in close contact with someone who has COVID or you develop fever 100.4 or greater, new onset of sneezing, cough, sore throat, shortness of breath or body aches.  If you test positive for Covid or have been in contact with anyone that has tested positive in the last 10 days please notify you surgeon.    Your procedure is scheduled on:  Tuesday  October 27, 2023  Report to [facility] Main Entrance: [place] entrance where the [employer] is available.   Report to admitting at: 12:00 noon  Call this number if you have any questions or problems the morning of surgery [phone]  Do not eat food after Midnight the night prior to your surgery/procedure.  After Midnight you may have the following liquids until   11:15 AM DAY OF SURGERY  Clear Liquid Diet Water Black Coffee (sugar ok, NO MILK/CREAM OR CREAMERS)  Tea (sugar ok, NO  MILK/CREAM OR CREAMERS) regular and decaf                             Plain Jell-O  with no fruit (NO RED)                                           Fruit ices (not with fruit pulp, NO RED)                                     Popsicles (NO RED)                                                                  Juice: NO CITRUS JUICES: only apple, WHITE grape, WHITE cranberry Sports drinks like Gatorade or Powerade (NO RED)                   The day of surgery:  Drink ONE (1) Pre-Surgery Clear Ensure at  11:15   AM the morning of surgery. Drink  in one sitting. Do not sip.  This drink was given to you during your hospital pre-op appointment visit. Nothing else to drink after completing the Pre-Surgery Clear Ensure: No candy, chewing gum or throat lozenges.    FOLLOW ANY ADDITIONAL PRE OP INSTRUCTIONS YOU RECEIVED FROM YOUR SURGEON'S OFFICE!!!   Oral Hygiene is also important to reduce your risk of infection.        Remember - BRUSH YOUR TEETH THE MORNING OF SURGERY WITH YOUR REGULAR TOOTHPASTE  Do NOT smoke after Midnight the night before surgery.  STOP TAKING all Vitamins, Herbs and supplements 1 week before your surgery.   Take ONLY these medicines the morning of surgery with A SIP OF WATER: Bupropion, Topamax, Buspar.  You may take Tylenol if needed.  You may use your inhalers as needed.                    You may not have any metal on your body including hair pins, jewelry, and body piercing  Do not wear make-up, lotions, powders, perfumes or deodorant  Do not wear nail polish including gel and S&S, artificial / acrylic nails, or any other type of covering on natural nails including finger and toenails. If you have artificial nails, gel coating, etc., that needs to be removed by a nail salon, Please have this removed prior to surgery. Not doing so may mean that your surgery could be cancelled or delayed if the Surgeon or anesthesia staff feels like they are unable to monitor you  safely.   Do not shave 48 hours prior to surgery to avoid nicks in your skin which may contribute to postoperative infections.   Contacts, Hearing Aids, dentures or bridgework may not be worn into surgery. DENTURES WILL BE REMOVED PRIOR TO SURGERY PLEASE DO NOT APPLY "Poly grip" OR ADHESIVES!!!  Patients discharged on the day of surgery will not be allowed to drive home.  Someone NEEDS to stay with you for the first 24 hours after anesthesia.  Do not bring your home medications to the hospital. The Pharmacy will dispense medications listed on your medication list to you during your admission in the Hospital.   Please read over the following fact sheets you were given: IF YOU HAVE QUESTIONS ABOUT YOUR PRE-OP INSTRUCTIONS, PLEASE CALL [phone]   [facility] - Preparing for Surgery Before surgery, you can play an important role.  Because skin is not sterile, your skin needs to be as free of germs as possible.  You can reduce the number of germs on your skin by washing with CHG (chlorahexidine gluconate) soap before surgery.  CHG is an antiseptic cleaner which kills germs and bonds with the skin to continue killing germs even after washing. Please DO NOT use if you have an allergy to CHG or antibacterial soaps.  If your skin becomes reddened/irritated stop using the CHG and inform your nurse when you arrive at Short Stay. Do not shave (including legs and underarms) for at least 48 hours prior to the first CHG shower.  You may shave your face/neck.  Please follow these instructions carefully:  1.  Shower with CHG Soap the night before surgery and the  morning of surgery.  2.  If you choose to wash your hair, wash your hair first as usual with your normal  shampoo.  3.  After you shampoo, rinse your hair and body thoroughly to remove the shampoo.  4.  Use CHG as you would any other liquid soap.  You can apply chg directly to the skin and wash.  Gently with a scrungie  or clean washcloth.  5.  Apply the CHG Soap to your body ONLY FROM THE NECK DOWN.   Do not use on face/ open                           Wound or open sores. Avoid contact with eyes, ears mouth and genitals (private parts).                       Wash face,  Genitals (private parts) with your normal soap.             6.  Wash thoroughly, paying special attention to the area where your  surgery  will be performed.  7.  Thoroughly rinse your body with warm water from the neck down.  8.  DO NOT shower/wash with your normal soap after using and rinsing off the CHG Soap.            9.  Pat yourself dry with a clean towel.            10.  Wear clean pajamas.            11.  Place clean sheets on your bed the night of your first shower and do not  sleep with pets.  ON THE DAY OF SURGERY : Do not apply any lotions/deodorants the morning of surgery.  Please wear clean clothes to the hospital/surgery center.     FAILURE TO FOLLOW THESE INSTRUCTIONS MAY RESULT IN THE CANCELLATION OF YOUR SURGERY  PATIENT SIGNATURE_________________________________  NURSE SIGNATURE__________________________________  ________________________________________________________________________      [name]    An incentive spirometer is a tool that can help keep your lungs clear and active. This tool measures how well you are filling your lungs with each breath. Taking long deep breaths may help reverse or decrease the chance of developing breathing (pulmonary) problems (especially infection) following: A long period of time when you are unable to move or be active. BEFORE THE PROCEDURE  If the spirometer includes an indicator to show your best effort, your nurse or respiratory therapist will set it to a desired goal. If possible, sit up straight or lean slightly forward. Try not to slouch. Hold the incentive spirometer in an upright position. INSTRUCTIONS FOR USE  Sit on the edge of your bed if  possible, or sit up as far as you can in bed or on a chair. Hold the incentive spirometer in an upright position. Breathe out normally. Place the mouthpiece in your mouth and seal your lips tightly around it. Breathe in slowly and as deeply as possible, raising the piston or the ball toward the top of the column. Hold your breath for 3-5 seconds or for as long as possible. Allow the piston or ball to fall to the bottom of the column. Remove the mouthpiece from your mouth and breathe out normally. Rest for a few seconds and repeat Steps 1 through 7 at least 10 times every 1-2 hours when you are awake. Take your time and take a few normal breaths between deep breaths. The spirometer may include an indicator to show your best effort. Use the indicator as a goal to work toward during each repetition. After each set of 10 deep breaths, practice coughing to be  sure your lungs are clear. If you have an incision (the cut made at the time of surgery), support your incision when coughing by placing a pillow or rolled up towels firmly against it. Once you are able to get out of bed, walk around indoors and cough well. You may stop using the incentive spirometer when instructed by your caregiver.  RISKS AND COMPLICATIONS Take your time so you do not get dizzy or light-headed. If you are in pain, you may need to take or ask for pain medication before doing incentive spirometry. It is harder to take a deep breath if you are having pain. AFTER USE Rest and breathe slowly and easily. It can be helpful to keep track of a log of your progress. Your caregiver can provide you with a simple table to help with this. If you are using the spirometer at home, follow these instructions: SEEK MEDICAL CARE IF:  You are having difficultly using the spirometer. You have trouble using the spirometer as often as instructed. Your pain medication is not giving enough relief while using the spirometer. You develop fever of  100.5 F (38.1 C) or higher.                                                                                                    SEEK IMMEDIATE MEDICAL CARE IF:  You cough up bloody sputum that had not been present before. You develop fever of 102 F (38.9 C) or greater. You develop worsening pain at or near the incision site. MAKE SURE YOU:  Understand these instructions. Will watch your condition. Will get help right away if you are not doing well or get worse. Document Released: 04/13/2007 Document Revised: 02/23/2012 Document Reviewed: 06/14/2007 [facility] Patient Information 2014 [place], [place].

## 2023-10-15 ENCOUNTER — Other Ambulatory Visit: Payer: Self-pay

## 2023-10-15 ENCOUNTER — Encounter (HOSPITAL_COMMUNITY)
Admission: RE | Admit: 2023-10-15 | Discharge: 2023-10-15 | Disposition: A | Discharge status: Home / Self Care | Payer: BC Managed Care – PPO | Source: Ambulatory Visit | Attending: Orthopaedic Surgery | Admitting: Orthopaedic Surgery

## 2023-10-15 ENCOUNTER — Encounter (HOSPITAL_COMMUNITY): Payer: Self-pay

## 2023-10-15 VITALS — BP 101/61 | HR 76 | Temp 98.5°F | Resp 12 | Ht 64.0 in | Wt 98.0 lb

## 2023-10-15 DIAGNOSIS — F50022 Anorexia nervosa, binge eating/purging type, severe: Secondary | ICD-10-CM | POA: Diagnosis not present

## 2023-10-15 DIAGNOSIS — F840 Autistic disorder: Secondary | ICD-10-CM | POA: Diagnosis not present

## 2023-10-15 DIAGNOSIS — R55 Syncope and collapse: Secondary | ICD-10-CM | POA: Diagnosis not present

## 2023-10-15 DIAGNOSIS — Z01810 Encounter for preprocedural cardiovascular examination: Secondary | ICD-10-CM | POA: Diagnosis not present

## 2023-10-15 DIAGNOSIS — F4312 Post-traumatic stress disorder, chronic: Secondary | ICD-10-CM | POA: Diagnosis not present

## 2023-10-15 DIAGNOSIS — Z01818 Encounter for other preprocedural examination: Secondary | ICD-10-CM

## 2023-10-15 HISTORY — DX: Cardiac arrhythmia, unspecified: I49.9

## 2023-10-19 DIAGNOSIS — F840 Autistic disorder: Secondary | ICD-10-CM | POA: Diagnosis not present

## 2023-10-19 DIAGNOSIS — F50022 Anorexia nervosa, binge eating/purging type, severe: Secondary | ICD-10-CM | POA: Diagnosis not present

## 2023-10-19 DIAGNOSIS — F4312 Post-traumatic stress disorder, chronic: Secondary | ICD-10-CM | POA: Diagnosis not present

## 2023-10-20 NOTE — Progress Notes (Signed)
DISCUSSION: [name] is a 32 yo female who presents to PAT prior to LEFT CARPAL TUNNEL RELEASE with Dr. Jerl Santos on 10/27/2023. 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  Prior anesthesia complications include PONV, prolonged emergence. Prior anesthesia note: "Myoclonic epilepsy with ragged red fibers: multiple antiepileptics - no missed doses, seizes after most anesthetics"  Patient follows with Cardiology for hx of syncope, long QTc. She is s/p ILR insertion on 10/2022. Etiology of syncopal episodes has been unclear. Cleared in telephone visit on 07/28/23:  "Preoperative Cardiovascular Risk Assessment:   Ms. Alberson's perioperative risk of a major cardiac event is 0.4% according to the Revised Cardiac Risk Index (RCRI).  Therefore, she is at low risk for perioperative complications.   Her functional capacity is excellent at 8.33 METs according to the Duke Activity Status Index (DASI). Recommendations: According to ACC/AHA guidelines, no further cardiovascular testing needed.  The patient may proceed to surgery at acceptable risk"  Patient follows with Neurology due to hx of seizures and MERRF. Last seen 06/26/2023. Pt desiring to wean off Keppra at that time. She reports that she typically has 2-4 complex partial seizures in PACU after any surgery lasting ~30 seconds each. Last seizure was ~1 week ago.  She wears a Dexcom for history of hypoglycemia.   Patient has a port at the right chest that was placed for history of difficult venous access. She states that for her last several surgeries she has been given medication through her port to help her relax prior to peripheral IV access being obtained and that this has worked well she. He also has a history of severe anxiety and PTSD and is accompanied to all appointments by her therapist, Trula Ore.   VS: BP 101/61 Comment: right  arm sitting  Pulse 76   Temp 36.9 C (Oral)   Resp 12   Ht 5\' 4"  (1.626 m)   Wt 44.5 kg   LMP 02/11/2021   SpO2 100%   BMI 16.82 kg/m   PROVIDERS: Shirline Frees, NP Cardiology: Carolan Clines, MD Neurology: Patrcia Dolly, MD  LABS: Labs reviewed: Acceptable for surgery. (all labs ordered are listed, but only abnormal results are displayed)  Labs Reviewed - No data to display   IMAGES:   EKG 10/15/23  Normal sinus rhythm, rate 63 Borderline criteria for Rightward axis   CV:  ILR check 08/05/23:  Remote device check reviewed. Histograms appropriate. Leads and battery stable for patient. Follow up as outlined above. No recommended changes.   Echo 10/04/2018: --------------------------------------------------  Study Conclusions   - Left ventricle: The cavity size was normal. Systolic function was    normal. The estimated ejection fraction was in the range of 60%    to 65%. Wall motion was normal; there were no regional wall    motion abnormalities. Left ventricular diastolic function    parameters were normal.  - Aortic valve: Transvalvular velocity was within the normal range.    There was no stenosis. There was no regurgitation.  - Mitral valve: Transvalvular velocity was within the normal range.    There was no evidence for stenosis. There was no regurgitation.  - Right ventricle: The cavity size was normal. Wall thickness was    normal. Systolic function was normal.  - Pulmonary arteries: Systolic pressure was within the normal    range. PA peak pressure: 20 mm Hg (S).   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   Gallstones    GERD (gastroesophageal reflux disease)    H/O absence seizures    Head injury, intracranial, with concussion    Hearing loss    both ears   History of asthma    no current med.   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)    Migraines    Pneumonia 12/2018   hx of    PONV (postoperative nausea and vomiting)    PTSD (post-traumatic stress disorder)    Seizures (HCC)    complex partial   Shoulder dislocation 09/2018   Left   Shoulder subluxation, left    Syncope    Tremor, unspecified    bilateral arms, comes and goes   Wears glasses    Wears hearing aid    bilateral    Past Surgical History:  Procedure Laterality Date   BIOPSY  10/20/2022   Procedure: BIOPSY;  Surgeon: Tressia Danas, MD;  Location: [facility];  Service: Gastroenterology;;   Fidela Salisbury RELEASE Right 08/18/2023   Procedure: CARPAL TUNNEL RELEASE;  Surgeon: Marcene Corning, MD;  Location: [facility];  Service: Orthopedics;  Laterality: Right;   CHOLECYSTECTOMY N/A 10/05/2018   Procedure: LAPAROSCOPIC CHOLECYSTECTOMY WITH INTRAOPERATIVE CHOLANGIOGRAM ERAS PATHWAY;  Surgeon: Ovidio Kin, MD;  Location: [facility];  Service: General;  Laterality: N/A;   CYSTOSCOPY N/A 03/12/2021   Procedure: CYSTOSCOPY;  Surgeon: Edwinna Areola, DO;  Location: [facility];  Service: Gynecology;  Laterality: N/A;   ESOPHAGOGASTRODUODENOSCOPY (EGD) WITH PROPOFOL N/A 10/20/2022   Procedure: ESOPHAGOGASTRODUODENOSCOPY (EGD) WITH PROPOFOL;  Surgeon: Tressia Danas, MD;  Location: [facility];  Service: Gastroenterology;  Laterality: N/A;   LAPAROSCOPIC APPENDECTOMY  11/26/2011   Procedure: APPENDECTOMY  LAPAROSCOPIC;  Surgeon: Atilano Ina, MD;  Location: [facility] OR;  Service: General;  Laterality: N/A;   left shoulder manipulation     in er multilple times last done july 2021   MASTECTOMY     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   NIPPLE SPARING MASTECTOMY Bilateral 02/25/2022   Procedure: BILATERAL NIPPLE-SPARING MASTECTOMY;  Surgeon: Almond Lint, MD;  Location: [facility];  Service: General;  Laterality: Bilateral;   PORT-A-CATH REMOVAL N/A 12/10/2021   Procedure: REMOVAL PORT-A-CATH AND REPLACEMENT;  Surgeon: Fritzi Mandes, MD;  Location: [facility];  Service: General;  Laterality: N/A;   PORTACATH PLACEMENT Right 08/28/2017   Procedure: POWER PORT PLACEMENT;  Surgeon: Ovidio Kin, MD;  Location: [facility];  Service: General;  Laterality: Right;   PORTACATH PLACEMENT N/A 12/04/2017   Procedure:  INSERTION PORT-A-CATH;  Surgeon: Ovidio Kin, MD;  Location: [facility] OR;  Service: General;  Laterality: N/A;   RADIOLOGY WITH ANESTHESIA Left 10/25/2019   Procedure: MRI  LEFT SHOLDER WITHOUT CONTRAST;  Surgeon: Radiologist, Medication, MD;  Location: [facility];  Service: Radiology;  Laterality: Left;   RADIOLOGY WITH ANESTHESIA Bilateral 11/19/2021   Procedure: MRI WITH ANESTHESIA OF BILATERAL BREASTS WITH AND WITHOUT CONTRAST;  Surgeon: Radiologist, Medication, MD;  Location: [facility];  Service: Radiology;  Laterality: Bilateral;   RADIOLOGY WITH ANESTHESIA N/A 02/24/2023   Procedure: MRI LEFT SHOULDER WITHOUT CONTRAST WITH ANESTHESIA;  Surgeon: Radiologist, Medication, MD;  Location: [facility];  Service: Radiology;  Laterality: N/A;   REPAIR ANKLE LIGAMENT Left    x 3   TOTAL LAPAROSCOPIC HYSTERECTOMY WITH SALPINGECTOMY Bilateral 03/12/2021   Procedure: TOTAL LAPAROSCOPIC HYSTERECTOMY WITH SALPINGECTOMY;  Surgeon: Edwinna Areola, DO;  Location: [facility];  Service: Gynecology;  Laterality: Bilateral;    MEDICATIONS:  acetaminophen (TYLENOL) 325 MG tablet    albuterol (VENTOLIN HFA) 108 (90 Base) MCG/ACT inhaler   buPROPion (WELLBUTRIN XL) 300 MG 24 hr tablet   busPIRone (BUSPAR) 15 MG tablet   Continuous Blood Gluc Sensor (FREESTYLE LIBRE 2 SENSOR) MISC   Continuous Blood Gluc Transmit (DEXCOM G6 TRANSMITTER) MISC   fluticasone furoate-vilanterol (BREO ELLIPTA) 100-25 MCG/ACT AEPB   gabapentin (NEURONTIN) 100 MG capsule   Glucagon, rDNA, (GLUCAGON EMERGENCY) 1 MG KIT   glucose monitoring kit (FREESTYLE) monitoring kit   Lacosamide (VIMPAT) 100 MG TABS   LORazepam (ATIVAN) 0.5 MG tablet   montelukast (SINGULAIR) 10 MG tablet   MOTEGRITY 2 MG TABS   perampanel (FYCOMPA) 8 MG tablet   topiramate (TOPAMAX) 100 MG tablet    alteplase (CATHFLO ACTIVASE) injection 2 mg   Ubaldo Glassing, PA-C [facility] Short Stay/Anesthesiology [place] Day Phone [phone] 10/20/2023 2:52 PM

## 2023-10-20 NOTE — Anesthesia Preprocedure Evaluation (Addendum)
Anesthesia Evaluation  Patient identified by MRN, date of birth, ID band Patient awake    Reviewed: Allergy & Precautions, NPO status , Patient's Chart, lab work & pertinent test results  History of Anesthesia Complications (+) PONV and history of anesthetic complications (epilepsy poorly controlled- has seizures after most anesthetics)  Airway Mallampati: III  TM Distance: >3 FB Neck ROM: Full    Dental  (+) Teeth Intact, Dental Advisory Given   Pulmonary asthma    Pulmonary exam normal breath sounds clear to auscultation       Cardiovascular negative cardio ROS Normal cardiovascular exam Rhythm:Regular Rate:Normal  TTE 2019 - Left ventricle: The cavity size was normal. Systolic function was    normal. The estimated ejection fraction was in the range of 60%    to 65%. Wall motion was normal; there were no regional wall    motion abnormalities. Left ventricular diastolic function    parameters were normal.  - Aortic valve: Transvalvular velocity was within the normal range.    There was no stenosis. There was no regurgitation.  - Mitral valve: Transvalvular velocity was within the normal range.    There was no evidence for stenosis. There was no regurgitation.  - Right ventricle: The cavity size was normal. Wall thickness was    normal. Systolic function was normal.  - Pulmonary arteries: Systolic pressure was within the normal    range. PA peak pressure: 20 mm Hg (S).     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 - no missed doses, seizes after most anesthetics CVA    GI/Hepatic Neg liver ROS,GERD  Controlled,,Weight loss, N/V   Endo/Other  negative endocrine ROS    Renal/GU negative Renal ROS  negative genitourinary   Musculoskeletal negative musculoskeletal ROS (+)    Abdominal   Peds   Hematology negative hematology ROS (+)   Anesthesia Other Findings   Reproductive/Obstetrics negative OB ROS                              Anesthesia Physical Anesthesia Plan  ASA: 3  Anesthesia Plan: MAC   Post-op Pain Management: Precedex and Minimal or no pain anticipated   Induction: Intravenous  PONV Risk Score and Plan: 3 and Midazolam and Propofol infusion  Airway Management Planned: Natural Airway  Additional Equipment: None  Intra-op Plan:   Post-operative Plan:   Informed Consent: I have reviewed the patients History and Physical, chart, labs and discussed the procedure including the risks, benefits and alternatives for the proposed anesthesia with the patient or authorized representative who has indicated his/her understanding and acceptance.     Dental advisory given  Plan Discussed with: CRNA  Anesthesia Plan Comments: (See PAT note from 10/31 by Sherlie Ban PA-C )        Anesthesia Quick Evaluation

## 2023-10-22 DIAGNOSIS — F4312 Post-traumatic stress disorder, chronic: Secondary | ICD-10-CM | POA: Diagnosis not present

## 2023-10-22 DIAGNOSIS — F50022 Anorexia nervosa, binge eating/purging type, severe: Secondary | ICD-10-CM | POA: Diagnosis not present

## 2023-10-22 DIAGNOSIS — F840 Autistic disorder: Secondary | ICD-10-CM | POA: Diagnosis not present

## 2023-10-26 DIAGNOSIS — F840 Autistic disorder: Secondary | ICD-10-CM | POA: Diagnosis not present

## 2023-10-26 DIAGNOSIS — F4312 Post-traumatic stress disorder, chronic: Secondary | ICD-10-CM | POA: Diagnosis not present

## 2023-10-26 DIAGNOSIS — F50022 Anorexia nervosa, binge eating/purging type, severe: Secondary | ICD-10-CM | POA: Diagnosis not present

## 2023-10-26 NOTE — H&P (Signed)
[name] is an 32 y.o. female.   Chief Complaint: left hand pain HPI: [name] is here about both wrists.  She is about a month from her right carpal tunnel release and says all of the numbness she had for years is completely gone.  She has some mild wrist stiffness.  On the left she has identical symptoms with numbness in median distribution.  She has failed bracing and would simply like to have the same surgery that she had on the right.  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   Gallstones    GERD (gastroesophageal reflux disease)    H/O absence seizures    Head injury, intracranial, with concussion    Hearing loss    both ears   History of asthma    no current med.   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)    Migraines    Pneumonia 12/2018   hx of    PONV (postoperative nausea and vomiting)    PTSD (post-traumatic stress disorder)    Seizures (HCC)    complex partial   Shoulder dislocation 09/2018   Left   Shoulder subluxation, left    Syncope    Tremor, unspecified    bilateral arms, comes and goes   Wears glasses    Wears hearing aid    bilateral    Past Surgical History:  Procedure Laterality Date   BIOPSY  10/20/2022   Procedure: BIOPSY;  Surgeon: Tressia Danas, MD;  Location: [facility];  Service:  Gastroenterology;;   Fidela Salisbury RELEASE Right 08/18/2023   Procedure: CARPAL TUNNEL RELEASE;  Surgeon: Marcene Corning, MD;  Location: [facility];  Service: Orthopedics;  Laterality: Right;   CHOLECYSTECTOMY N/A 10/05/2018   Procedure: LAPAROSCOPIC CHOLECYSTECTOMY WITH INTRAOPERATIVE CHOLANGIOGRAM ERAS PATHWAY;  Surgeon: Ovidio Kin, MD;  Location: [facility];  Service: General;  Laterality: N/A;   CYSTOSCOPY N/A 03/12/2021   Procedure: CYSTOSCOPY;  Surgeon: Edwinna Areola, DO;  Location: [facility];  Service: Gynecology;  Laterality: N/A;   ESOPHAGOGASTRODUODENOSCOPY (EGD) WITH PROPOFOL N/A 10/20/2022   Procedure: ESOPHAGOGASTRODUODENOSCOPY (EGD) WITH PROPOFOL;  Surgeon: Tressia Danas, MD;  Location: [facility];  Service: Gastroenterology;  Laterality: N/A;   LAPAROSCOPIC APPENDECTOMY  11/26/2011   Procedure: APPENDECTOMY LAPAROSCOPIC;  Surgeon: Atilano Ina, MD;  Location: [facility];  Service: General;  Laterality: N/A;   left shoulder manipulation     in er multilple times last done july 2021   MASTECTOMY     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   NIPPLE SPARING MASTECTOMY  Bilateral 02/25/2022   Procedure: BILATERAL NIPPLE-SPARING MASTECTOMY;  Surgeon: Almond Lint, MD;  Location: [facility];  Service: General;  Laterality: Bilateral;   PORT-A-CATH REMOVAL N/A 12/10/2021   Procedure: REMOVAL PORT-A-CATH AND REPLACEMENT;  Surgeon: Fritzi Mandes, MD;  Location: [facility];  Service: General;  Laterality: N/A;   PORTACATH PLACEMENT Right 08/28/2017   Procedure: POWER PORT PLACEMENT;  Surgeon: Ovidio Kin, MD;  Location: [facility];  Service: General;  Laterality: Right;   PORTACATH PLACEMENT N/A 12/04/2017   Procedure: INSERTION PORT-A-CATH;  Surgeon: Ovidio Kin, MD;  Location: [facility] OR;  Service: General;  Laterality: N/A;   RADIOLOGY WITH ANESTHESIA Left 10/25/2019   Procedure: MRI  LEFT SHOLDER WITHOUT CONTRAST;  Surgeon: Radiologist, Medication,  MD;  Location: [facility];  Service: Radiology;  Laterality: Left;   RADIOLOGY WITH ANESTHESIA Bilateral 11/19/2021   Procedure: MRI WITH ANESTHESIA OF BILATERAL BREASTS WITH AND WITHOUT CONTRAST;  Surgeon: Radiologist, Medication, MD;  Location: [facility];  Service: Radiology;  Laterality: Bilateral;   RADIOLOGY WITH ANESTHESIA N/A 02/24/2023   Procedure: MRI LEFT SHOULDER WITHOUT CONTRAST WITH ANESTHESIA;  Surgeon: Radiologist, Medication, MD;  Location: [facility];  Service: Radiology;  Laterality: N/A;   REPAIR ANKLE LIGAMENT Left    x 3   TOTAL LAPAROSCOPIC HYSTERECTOMY WITH SALPINGECTOMY Bilateral 03/12/2021   Procedure: TOTAL LAPAROSCOPIC HYSTERECTOMY WITH SALPINGECTOMY;  Surgeon: Edwinna Areola, DO;  Location: [facility];  Service: Gynecology;  Laterality: Bilateral;    Family History  Problem Relation Age of Onset   Polymyalgia rheumatica Mother    Heart disease Mother    Sudden Cardiac Death Father        long QT syndrome    Heart disease Maternal Grandmother    Breast cancer Maternal Grandmother    Stomach cancer Maternal Grandmother    Heart disease Maternal Grandfather    Stroke Maternal Grandfather    Diabetes Maternal Grandfather    COPD Maternal Grandfather    Kidney failure Maternal Grandfather    Colon cancer Paternal Grandmother 40   Heart disease Paternal Grandmother    Heart disease Paternal Grandfather    Migraines Paternal Grandfather    Esophageal cancer Cousin    Pancreatic cancer Neg Hx    Social History:  reports that she has never smoked. She has never used smokeless tobacco. She reports that she does not drink alcohol and does not use drugs.  Allergies:  Allergies  Allergen Reactions   Cefzil [Cefprozil] Anaphylaxis, Shortness Of Breath and Rash   Cephalosporins Anaphylaxis, Shortness Of Breath and Rash   Latex Shortness Of Breath and Rash   Penicillins Anaphylaxis, Hives, Rash and Other (See Comments)    Tongue swells, throat does not close  Did it involve  swelling of the face/tongue/throat, SOB, or low BP? No Did it involve sudden or severe rash/hives, skin peeling, or any reaction on the inside of your mouth or nose? Yes Did you need to seek medical attention at a hospital or doctor's office? No When did it last happen?      4-5 years If all above answers are "NO", may proceed with cephalosporin use.    Ciprofloxacin Rash    Rash and vomiting.   Shellfish Allergy Hives and Rash   Grass Pollen(K-O-R-T-Swt Vern) Hives   Morphine And Codeine Nausea And Vomiting    Severe n/v   Iodine Rash   Tegaderm Ag Mesh [Silver] Rash   Vancomycin Hives, Itching and Rash    No medications prior to  admission.    No results found for this or any previous visit (from the past 48 hour(s)). No results found.  Review of Systems  Musculoskeletal:  Positive for arthralgias.       Left hand  All other systems reviewed and are negative.   Last menstrual period 02/11/2021. Physical Exam Constitutional:      Appearance: Normal appearance.  HENT:     Head: Normocephalic and atraumatic.     Nose: Nose normal.     Mouth/Throat:     Pharynx: Oropharynx is clear.  Eyes:     Extraocular Movements: Extraocular movements intact.  Pulmonary:     Effort: Pulmonary effort is normal.  Abdominal:     Palpations: Abdomen is soft.  Musculoskeletal:     Cervical back: Normal range of motion.     Comments: Left wrist motion is full.  She has no thenar atrophy.  There is subjective numbness in median distribution and she fails two-point discrimination at 10 mm.  Elbow motion is full.  She has a palpable pulse at the wrist.  On the right she has some erythema about her scar but the scar is healed.    Skin:    General: Skin is warm and dry.  Neurological:     General: No focal deficit present.     Mental Status: She is alert and oriented to person, place, and time.  Psychiatric:        Mood and Affect: Mood normal.        Behavior: Behavior normal.         Thought Content: Thought content normal.        Judgment: Judgment normal.      Assessment/Plan Assessment:   Left carpal tunnel syndrome  Plan: [name] has identical symptoms on the left as she once had on the right.  She would basically like the same surgical treatment.  I reviewed risk of anesthesia, infection, neurovascular injury.  We will probably set this up on an outpatient basis at [facility] once again using the anesthesiologist of her choice.  Ginger Organ Renuka Farfan, PA-C 10/26/2023, 2:03 PM

## 2023-10-27 ENCOUNTER — Ambulatory Visit (HOSPITAL_COMMUNITY): Payer: BC Managed Care – PPO | Admitting: Anesthesiology

## 2023-10-27 ENCOUNTER — Ambulatory Visit (HOSPITAL_COMMUNITY)
Admission: RE | Admit: 2023-10-27 | Discharge: 2023-10-27 | Disposition: A | Discharge status: Home / Self Care | Payer: BC Managed Care – PPO | Attending: Orthopaedic Surgery | Admitting: Orthopaedic Surgery

## 2023-10-27 ENCOUNTER — Other Ambulatory Visit: Payer: Self-pay

## 2023-10-27 ENCOUNTER — Encounter (HOSPITAL_COMMUNITY): Payer: Self-pay | Admitting: Orthopaedic Surgery

## 2023-10-27 ENCOUNTER — Ambulatory Visit (HOSPITAL_COMMUNITY): Payer: BC Managed Care – PPO | Admitting: Medical

## 2023-10-27 ENCOUNTER — Encounter (HOSPITAL_COMMUNITY)
Admission: RE | Disposition: A | Discharge status: Home / Self Care | Payer: Self-pay | Source: Home / Self Care | Attending: Orthopaedic Surgery

## 2023-10-27 DIAGNOSIS — F419 Anxiety disorder, unspecified: Secondary | ICD-10-CM | POA: Diagnosis not present

## 2023-10-27 DIAGNOSIS — G40409 Other generalized epilepsy and epileptic syndromes, not intractable, without status epilepticus: Secondary | ICD-10-CM | POA: Diagnosis not present

## 2023-10-27 DIAGNOSIS — G5602 Carpal tunnel syndrome, left upper limb: Secondary | ICD-10-CM | POA: Diagnosis not present

## 2023-10-27 DIAGNOSIS — Z8673 Personal history of transient ischemic attack (TIA), and cerebral infarction without residual deficits: Secondary | ICD-10-CM | POA: Diagnosis not present

## 2023-10-27 DIAGNOSIS — J45909 Unspecified asthma, uncomplicated: Secondary | ICD-10-CM | POA: Diagnosis not present

## 2023-10-27 DIAGNOSIS — F840 Autistic disorder: Secondary | ICD-10-CM | POA: Diagnosis not present

## 2023-10-27 DIAGNOSIS — F4312 Post-traumatic stress disorder, chronic: Secondary | ICD-10-CM | POA: Diagnosis not present

## 2023-10-27 HISTORY — PX: CARPAL TUNNEL RELEASE: SHX101

## 2023-10-27 LAB — GLUCOSE, CAPILLARY: Glucose-Capillary: 82 mg/dL (ref 70–99)

## 2023-10-27 SURGERY — RELEASE, CARPAL TUNNEL, ENDOSCOPIC
Anesthesia: Monitor Anesthesia Care | Laterality: Left

## 2023-10-27 MED ORDER — PROPOFOL 10 MG/ML IV BOLUS
INTRAVENOUS | Status: DC | PRN
  Administered 2023-10-27 (×2): 50 mg via INTRAVENOUS
  Administered 2023-10-27: 30 mg via INTRAVENOUS

## 2023-10-27 MED ORDER — MIDAZOLAM HCL 2 MG/2ML IJ SOLN
INTRAMUSCULAR | Status: AC
  Filled 2023-10-27: qty 2

## 2023-10-27 MED ORDER — MIDAZOLAM HCL 5 MG/5ML IJ SOLN
INTRAMUSCULAR | Status: DC | PRN
  Administered 2023-10-27 (×2): 2 mg via INTRAVENOUS

## 2023-10-27 MED ORDER — HYDROMORPHONE HCL 1 MG/ML IJ SOLN: 0.2500 mg | INTRAMUSCULAR | Status: DC | PRN

## 2023-10-27 MED ORDER — BUPIVACAINE HCL 0.25 % IJ SOLN
INTRAMUSCULAR | Status: AC
  Filled 2023-10-27: qty 1

## 2023-10-27 MED ORDER — HEPARIN SOD (PORK) LOCK FLUSH 100 UNIT/ML IV SOLN
500.0000 [IU] | Freq: Once | INTRAVENOUS | Status: AC
  Administered 2023-10-27: 500 [IU] via INTRAVENOUS

## 2023-10-27 MED ORDER — OXYCODONE HCL 5 MG/5ML PO SOLN: 5.0000 mg | Freq: Once | ORAL | Status: AC | PRN

## 2023-10-27 MED ORDER — 0.9 % SODIUM CHLORIDE (POUR BTL) OPTIME
TOPICAL | Status: DC | PRN
  Administered 2023-10-27: 1000 mL

## 2023-10-27 MED ORDER — BUPIVACAINE HCL (PF) 0.25 % IJ SOLN
INTRAMUSCULAR | Status: AC
  Filled 2023-10-27: qty 30

## 2023-10-27 MED ORDER — GENTAMICIN SULFATE 40 MG/ML IJ SOLN
5.0000 mg/kg | INTRAVENOUS | Status: AC
  Administered 2023-10-27: 220 mg via INTRAVENOUS
  Filled 2023-10-27: qty 5.5

## 2023-10-27 MED ORDER — CHLORHEXIDINE GLUCONATE 0.12 % MT SOLN
15.0000 mL | Freq: Once | OROMUCOSAL | Status: AC
  Administered 2023-10-27: 15 mL via OROMUCOSAL

## 2023-10-27 MED ORDER — ACETAMINOPHEN-CODEINE 300-30 MG PO TABS
1.0000 | ORAL_TABLET | Freq: Four times a day (QID) | ORAL | 0 refills | Status: DC | PRN
Start: 2023-10-27 — End: 2024-04-02

## 2023-10-27 MED ORDER — OXYCODONE HCL 5 MG PO TABS
ORAL_TABLET | ORAL | Status: AC
  Filled 2023-10-27: qty 1

## 2023-10-27 MED ORDER — HEPARIN SOD (PORK) LOCK FLUSH 100 UNIT/ML IV SOLN: 500.0000 [IU] | INTRAVENOUS | Status: DC | PRN

## 2023-10-27 MED ORDER — BUPIVACAINE-EPINEPHRINE 0.25% -1:200000 IJ SOLN
INTRAMUSCULAR | Status: DC | PRN
Start: 2023-10-27 — End: 2023-10-27
  Administered 2023-10-27: 3 mL

## 2023-10-27 MED ORDER — OXYCODONE HCL 5 MG PO TABS
5.0000 mg | ORAL_TABLET | Freq: Once | ORAL | Status: AC | PRN
  Administered 2023-10-27: 5 mg via ORAL

## 2023-10-27 MED ORDER — ORAL CARE MOUTH RINSE: 15.0000 mL | Freq: Once | OROMUCOSAL | Status: AC

## 2023-10-27 MED ORDER — LIDOCAINE HCL (PF) 1 % IJ SOLN
INTRAMUSCULAR | Status: AC
  Filled 2023-10-27: qty 30

## 2023-10-27 MED ORDER — FENTANYL CITRATE (PF) 100 MCG/2ML IJ SOLN
INTRAMUSCULAR | Status: AC
  Filled 2023-10-27: qty 2

## 2023-10-27 MED ORDER — LIDOCAINE HCL (PF) 1 % IJ SOLN
INTRAMUSCULAR | Status: DC | PRN
  Administered 2023-10-27: 3 mL

## 2023-10-27 MED ORDER — DEXMEDETOMIDINE HCL IN NACL 80 MCG/20ML IV SOLN
INTRAVENOUS | Status: DC | PRN
  Administered 2023-10-27: 20 ug via INTRAVENOUS
  Administered 2023-10-27 (×2): 10 ug via INTRAVENOUS

## 2023-10-27 MED ORDER — ONDANSETRON HCL 4 MG/2ML IJ SOLN
INTRAMUSCULAR | Status: DC | PRN
  Administered 2023-10-27: 4 mg via INTRAVENOUS

## 2023-10-27 MED ORDER — PROPOFOL 500 MG/50ML IV EMUL
INTRAVENOUS | Status: DC | PRN
  Administered 2023-10-27: 125 ug/kg/min via INTRAVENOUS

## 2023-10-27 MED ORDER — EPINEPHRINE PF 1 MG/ML IJ SOLN
INTRAMUSCULAR | Status: AC
  Filled 2023-10-27: qty 1

## 2023-10-27 MED ORDER — VANCOMYCIN HCL IN DEXTROSE 1-5 GM/200ML-% IV SOLN: 1000.0000 mg | INTRAVENOUS | Status: DC

## 2023-10-27 MED ORDER — MIDAZOLAM HCL 2 MG/2ML IJ SOLN
INTRAMUSCULAR | Status: AC
Start: 2023-10-27 — End: ?
  Filled 2023-10-27: qty 2

## 2023-10-27 MED ORDER — LACTATED RINGERS IV SOLN: INTRAVENOUS | Status: DC

## 2023-10-27 MED ORDER — HEPARIN SOD (PORK) LOCK FLUSH 100 UNIT/ML IV SOLN
INTRAVENOUS | Status: AC
  Filled 2023-10-27: qty 5

## 2023-10-27 SURGICAL SUPPLY — 60 items
BAG COUNTER SPONGE SURGICOUNT (BAG) IMPLANT
BAG SPNG CNTER NS LX DISP (BAG)
BLADE MINI RND TIP GREEN BEAV (BLADE) IMPLANT
BLADE SURG 15 STRL LF DISP TIS (BLADE) ×1 IMPLANT
BLADE SURG 15 STRL SS (BLADE) ×1
BNDG CMPR 5X2 CHSV 1 LYR STRL (GAUZE/BANDAGES/DRESSINGS) ×1
BNDG CMPR 5X2 KNTD ELC UNQ LF (GAUZE/BANDAGES/DRESSINGS)
BNDG CMPR 5X4 KNIT ELC UNQ LF (GAUZE/BANDAGES/DRESSINGS) ×1
BNDG CMPR 9X4 STRL LF SNTH (GAUZE/BANDAGES/DRESSINGS) ×1
BNDG COHESIVE 2X5 TAN ST LF (GAUZE/BANDAGES/DRESSINGS) IMPLANT
BNDG ELASTIC 2INX 5YD STR LF (GAUZE/BANDAGES/DRESSINGS) IMPLANT
BNDG ELASTIC 4INX 5YD STR LF (GAUZE/BANDAGES/DRESSINGS) ×1 IMPLANT
BNDG ESMARK 4X9 LF (GAUZE/BANDAGES/DRESSINGS) ×1 IMPLANT
BNDG GAUZE DERMACEA FLUFF 4 (GAUZE/BANDAGES/DRESSINGS) ×1 IMPLANT
BNDG GZE DERMACEA 4 6PLY (GAUZE/BANDAGES/DRESSINGS) ×1
BNDG PLASTER X FAST 3X3 WHT LF (CAST SUPPLIES) IMPLANT
BNDG PLSTR 9X3 FST ST WHT (CAST SUPPLIES)
CORD BIPOLAR FORCEPS 12FT (ELECTRODE) ×1 IMPLANT
COVER BACK TABLE 60X90IN (DRAPES) ×1 IMPLANT
COVER MAYO STAND STRL (DRAPES) ×1 IMPLANT
CUFF TOURN SGL QUICK 18X4 (TOURNIQUET CUFF) IMPLANT
DRAPE EXTREMITY T 121X128X90 (DISPOSABLE) ×1 IMPLANT
DRAPE SURG 17X23 STRL (DRAPES) ×1 IMPLANT
DRAPE U-SHAPE 47X51 STRL (DRAPES) ×1 IMPLANT
DRSG EMULSION OIL 3X3 NADH (GAUZE/BANDAGES/DRESSINGS) ×1 IMPLANT
DURAPREP 26ML APPLICATOR (WOUND CARE) ×1 IMPLANT
ELECT NDL TIP 2.8 STRL (NEEDLE) IMPLANT
ELECT NEEDLE TIP 2.8 STRL (NEEDLE)
ELECT REM PT RETURN 15FT ADLT (MISCELLANEOUS) ×1 IMPLANT
GAUZE PAD ABD 8X10 STRL (GAUZE/BANDAGES/DRESSINGS) IMPLANT
GAUZE SPONGE 4X4 12PLY STRL (GAUZE/BANDAGES/DRESSINGS) ×1 IMPLANT
GAUZE XEROFORM 1X8 LF (GAUZE/BANDAGES/DRESSINGS) IMPLANT
GLOVE BIO SURGEON STRL SZ8 (GLOVE) ×2 IMPLANT
GLOVE BIOGEL PI IND STRL 7.0 (GLOVE) ×1 IMPLANT
GLOVE BIOGEL PI IND STRL 8 (GLOVE) ×2 IMPLANT
GLOVE SURG SYN 7.0 (GLOVE) ×1
GLOVE SURG SYN 7.0 PF PI (GLOVE) ×1 IMPLANT
GOWN SRG XL LVL 4 BRTHBL STRL (GOWNS) ×1 IMPLANT
GOWN STRL NON-REIN XL LVL4 (GOWNS) ×1
GOWN STRL REUS W/ TWL XL LVL3 (GOWN DISPOSABLE) ×2 IMPLANT
GOWN STRL REUS W/TWL XL LVL3 (GOWN DISPOSABLE) ×2
KIT BASIN OR (CUSTOM PROCEDURE TRAY) ×1 IMPLANT
KIT TURNOVER KIT A (KITS) IMPLANT
LOOP VESSEL MAXI BLUE (MISCELLANEOUS) IMPLANT
NDL HYPO 25X1 1.5 SAFETY (NEEDLE) IMPLANT
NEEDLE HYPO 25X1 1.5 SAFETY (NEEDLE)
NS IRRIG 1000ML POUR BTL (IV SOLUTION) ×1 IMPLANT
PACK ORTHO EXTREMITY (CUSTOM PROCEDURE TRAY) IMPLANT
PAD CAST 4YDX4 CTTN HI CHSV (CAST SUPPLIES) ×1 IMPLANT
PADDING CAST ABS COTTON 4X4 ST (CAST SUPPLIES) IMPLANT
PADDING CAST COTTON 4X4 STRL (CAST SUPPLIES) ×1
PENCIL SMOKE EVACUATOR (MISCELLANEOUS) IMPLANT
STOCKINETTE 4X48 STRL (DRAPES) ×1 IMPLANT
SUT ETHILON 3 0 PS 1 (SUTURE) ×1 IMPLANT
SUT ETHILON 4 0 PS 2 18 (SUTURE) ×1 IMPLANT
SUT VIC AB 4-0 P-3 18XBRD (SUTURE) IMPLANT
SUT VIC AB 4-0 P3 18 (SUTURE)
SYR CONTROL 10ML LL (SYRINGE) IMPLANT
TOWEL OR 17X26 10 PK STRL BLUE (TOWEL DISPOSABLE) ×1 IMPLANT
UNDERPAD 30X36 HEAVY ABSORB (UNDERPADS AND DIAPERS) ×1 IMPLANT

## 2023-10-27 NOTE — Op Note (Signed)
PRE-OP DIAGNOSIS:  left carpal tunnel syndrome POST-OP DIAGNOSIS:  same PROCEDURE:  left carpal tunnel release ANESTHESIA:  local and MAC ATTENDING SURGEON:  Marcene Corning MD ASSISTANT:  Annette Florence PA  INDICATION FOR PROCEDURE:  [name] is a 32 y.o. female with a long history of hand pain and numbness.  The patient has failed conservative measures of treatment and persists with symptoms which are very limiting.  The patient is offered carpal tunnel release in hopes of improving their situation by relieving pressure on the medial nerve.  Informed operative consent was obtained after discussion of possible complications including reaction to anesthesia, infection, and neurovascular injury.  SUMMARY OF FINDINGS AND PROCEDURE:  Under to above anesthetic a carpal tunnel release was performed.  The patient had a moderately thickened transverse carpal tunnel ligament which was applying compression to the underlying medial nerve.  This was release under direct visualization.  The skin was closed primarily followed by placement of a sterile dressing and splint with the wrist in slight extension.  DESCRIPTION OF PROCEDURE:  [name] was taken to an operative suite where the above anesthetic was applied.  The patient was then prepped and draped in normal sterile fashion.  An appropriate time out was performed.  After the administration of gentamycin pre-operative antibiotic and application and inflation of a tourniquet a small incision was made on the palmar aspect of the palm.  This did not cross the wrist flexion crease and was ulnar to the thenar flexion crease.  Dissection was carried down through palmar fascia to expose the transverse carpal ligament which was released under direct visualization.  The dissection was taken distally towards the transverse arch of vessels and distally through the distal fascia of the forearm.  The wound was irrigated and the skin was closed with nylon.  The tourniquet  was deflated and skin edges became pink immediately and pulses were intact.  Adaptic was applied along with a sterile dressing and a plaster splint with the wrist in slight extension.  Estimated blood loss and intraoperative fluids can be obtained from anesthesia records as can tourniquet time.  DISPOSITION:  [name] was taken to recovery room in stable condition.  Plans were to go home same day and I will contact the patient by phone tonight.  The patient will follow-up in about a week in the office.  [name] 10/27/2023, 1:21 PM

## 2023-10-27 NOTE — Transfer of Care (Signed)
Immediate Anesthesia Transfer of Care Note  Patient: Evey Mcfadyen  Procedure(s) Performed: LEFT CARPAL TUNNEL RELEASE (Left)  Patient Location: PACU  Anesthesia Type:MAC  Level of Consciousness: awake, alert , and oriented  Airway & Oxygen Therapy: Patient Spontanous Breathing and Patient connected to face mask oxygen  Post-op Assessment: Report given to RN and Post -op Vital signs reviewed and stable  Post vital signs: Reviewed and stable  Last Vitals:  Vitals Value Taken Time  BP 75/48 10/27/23 1334  Temp    Pulse 57 10/27/23 1335  Resp 16 10/27/23 1335  SpO2 100 % 10/27/23 1335  Vitals shown include unfiled device data.  Last Pain:  Vitals:   10/27/23 1200  TempSrc:   PainSc: 0-No pain         Complications: No notable events documented.

## 2023-10-27 NOTE — Anesthesia Postprocedure Evaluation (Signed)
Anesthesia Post Note  Patient: [name]  Procedure(s) Performed: LEFT CARPAL TUNNEL RELEASE (Left)     Patient location during evaluation: PACU Anesthesia Type: MAC Level of consciousness: awake and alert Pain management: pain level controlled Vital Signs Assessment: post-procedure vital signs reviewed and stable Respiratory status: spontaneous breathing, nonlabored ventilation and respiratory function stable Cardiovascular status: blood pressure returned to baseline and stable Postop Assessment: no apparent nausea or vomiting Anesthetic complications: no   No notable events documented.  Last Vitals:  Vitals:   10/27/23 1445 10/27/23 1523  BP: (!) 88/64 99/67  Pulse:  (!) 58  Resp: (!) 21   Temp: 36.7 C 36.7 C  SpO2: 100% 100%    Last Pain:  Vitals:   10/27/23 1523  TempSrc: Oral  PainSc: 3                  Lowella Curb

## 2023-10-27 NOTE — Anesthesia Procedure Notes (Signed)
Procedure Name: MAC Date/Time: 10/27/2023 12:55 PM  Performed by: Cleda Clarks, CRNAPre-anesthesia Checklist: Patient identified, Emergency Drugs available, Suction available, Patient being monitored and Timeout performed Patient Re-evaluated:Patient Re-evaluated prior to induction Oxygen Delivery Method: Simple face mask Placement Confirmation: positive ETCO2

## 2023-10-28 ENCOUNTER — Encounter (HOSPITAL_COMMUNITY): Payer: Self-pay | Admitting: Orthopaedic Surgery

## 2023-10-28 DIAGNOSIS — F4312 Post-traumatic stress disorder, chronic: Secondary | ICD-10-CM | POA: Diagnosis not present

## 2023-10-28 DIAGNOSIS — F840 Autistic disorder: Secondary | ICD-10-CM | POA: Diagnosis not present

## 2023-10-31 DIAGNOSIS — F840 Autistic disorder: Secondary | ICD-10-CM | POA: Diagnosis not present

## 2023-10-31 DIAGNOSIS — F4312 Post-traumatic stress disorder, chronic: Secondary | ICD-10-CM | POA: Diagnosis not present

## 2023-11-02 DIAGNOSIS — F840 Autistic disorder: Secondary | ICD-10-CM | POA: Diagnosis not present

## 2023-11-02 DIAGNOSIS — F4312 Post-traumatic stress disorder, chronic: Secondary | ICD-10-CM | POA: Diagnosis not present

## 2023-11-05 DIAGNOSIS — F4312 Post-traumatic stress disorder, chronic: Secondary | ICD-10-CM | POA: Diagnosis not present

## 2023-11-05 DIAGNOSIS — F410 Panic disorder [episodic paroxysmal anxiety] without agoraphobia: Secondary | ICD-10-CM | POA: Diagnosis not present

## 2023-11-05 DIAGNOSIS — F840 Autistic disorder: Secondary | ICD-10-CM | POA: Diagnosis not present

## 2023-11-09 DIAGNOSIS — F4312 Post-traumatic stress disorder, chronic: Secondary | ICD-10-CM | POA: Diagnosis not present

## 2023-11-09 DIAGNOSIS — F840 Autistic disorder: Secondary | ICD-10-CM | POA: Diagnosis not present

## 2023-11-13 ENCOUNTER — Ambulatory Visit (INDEPENDENT_AMBULATORY_CARE_PROVIDER_SITE_OTHER): Payer: BC Managed Care – PPO

## 2023-11-13 DIAGNOSIS — R55 Syncope and collapse: Secondary | ICD-10-CM

## 2023-11-14 ENCOUNTER — Encounter: Payer: Self-pay | Admitting: Neurology

## 2023-11-15 LAB — CUP PACEART REMOTE DEVICE CHECK
Date Time Interrogation Session: 20241128230745
Implantable Pulse Generator Implant Date: 20231117

## 2023-11-16 DIAGNOSIS — F840 Autistic disorder: Secondary | ICD-10-CM | POA: Diagnosis not present

## 2023-11-16 DIAGNOSIS — F4312 Post-traumatic stress disorder, chronic: Secondary | ICD-10-CM | POA: Diagnosis not present

## 2023-11-18 DIAGNOSIS — F840 Autistic disorder: Secondary | ICD-10-CM | POA: Diagnosis not present

## 2023-11-18 DIAGNOSIS — F4312 Post-traumatic stress disorder, chronic: Secondary | ICD-10-CM | POA: Diagnosis not present

## 2023-11-19 DIAGNOSIS — F840 Autistic disorder: Secondary | ICD-10-CM | POA: Diagnosis not present

## 2023-11-19 DIAGNOSIS — F4312 Post-traumatic stress disorder, chronic: Secondary | ICD-10-CM | POA: Diagnosis not present

## 2023-11-23 DIAGNOSIS — F840 Autistic disorder: Secondary | ICD-10-CM | POA: Diagnosis not present

## 2023-11-23 DIAGNOSIS — F4312 Post-traumatic stress disorder, chronic: Secondary | ICD-10-CM | POA: Diagnosis not present

## 2023-11-26 DIAGNOSIS — F4312 Post-traumatic stress disorder, chronic: Secondary | ICD-10-CM | POA: Diagnosis not present

## 2023-11-26 DIAGNOSIS — F840 Autistic disorder: Secondary | ICD-10-CM | POA: Diagnosis not present

## 2023-11-26 MED ORDER — TOPIRAMATE 100 MG PO TABS: ORAL_TABLET | ORAL | 3 refills | Status: DC

## 2023-11-30 DIAGNOSIS — F840 Autistic disorder: Secondary | ICD-10-CM | POA: Diagnosis not present

## 2023-11-30 DIAGNOSIS — F4312 Post-traumatic stress disorder, chronic: Secondary | ICD-10-CM | POA: Diagnosis not present

## 2023-12-01 DIAGNOSIS — F840 Autistic disorder: Secondary | ICD-10-CM | POA: Diagnosis not present

## 2023-12-01 DIAGNOSIS — F4312 Post-traumatic stress disorder, chronic: Secondary | ICD-10-CM | POA: Diagnosis not present

## 2023-12-02 ENCOUNTER — Telehealth: Payer: Self-pay | Admitting: Adult Health

## 2023-12-02 ENCOUNTER — Telehealth: Payer: BC Managed Care – PPO | Admitting: Adult Health

## 2023-12-02 DIAGNOSIS — J988 Other specified respiratory disorders: Secondary | ICD-10-CM | POA: Diagnosis not present

## 2023-12-02 DIAGNOSIS — R197 Diarrhea, unspecified: Secondary | ICD-10-CM | POA: Diagnosis not present

## 2023-12-02 DIAGNOSIS — F840 Autistic disorder: Secondary | ICD-10-CM | POA: Diagnosis not present

## 2023-12-02 DIAGNOSIS — F4312 Post-traumatic stress disorder, chronic: Secondary | ICD-10-CM | POA: Diagnosis not present

## 2023-12-02 DIAGNOSIS — R112 Nausea with vomiting, unspecified: Secondary | ICD-10-CM

## 2023-12-02 NOTE — Telephone Encounter (Signed)
Pt would like a call back from [place] she called patient and there isnt any notes for me relay to patient

## 2023-12-02 NOTE — Progress Notes (Signed)
Virtual Visit via Video Note  I connected with [name] on 12/02/23 at  1:30 PM EST by a video enabled telemedicine application and verified that I am speaking with the correct person using two identifiers.  Location patient: home Location provider:work or home office Persons participating in the virtual visit: patient, provider  I discussed the limitations of evaluation and management by telemedicine and the availability of in person appointments. The patient expressed understanding and agreed to proceed.   HPI: 32 year old female who is being evaluated today for multiple issues.  First issue is an acute issue.  Symptoms have been present for the last 6 days.  Symptoms include sore throat which is painful to swallow, chest congestion, Productive cough, and low-grade fever 99 to 100 degrees.  She did take a at home COVID test which was negative  She also has a history of nausea and vomiting.  She has been seen by [facility] gastroenterology in the past and had an endoscopy done in November 2023 which showed a normal esophagus, erythematous mucosa in the gastric body and normal duodenum.  She had biopsies taken of the erythematous mucosa which was suggestive of reflux.  Gastroenterologist left the practice and then she was seen at [facility] health GI,  Dr. Caryl Never in March 2024.  He has had multiple abdominal surgeries in the past and gastroenterology thought that her symptoms may be due to small bowel obstruction due to multiple abdominal surgeries.  She was sent for further testing but this was in [facility] and she did not complete them.  Today she reports that she continues to have episodes of "projectile vomiting that can last multiple hours and she will see undigested food.  She does continue to have diarrhea after eating.  She would like to be referred back to gastroenterology  ROS: See pertinent positives and negatives per HPI.  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   Gallstones    GERD (gastroesophageal reflux disease)    H/O absence seizures    Head injury, intracranial, with concussion    Hearing loss    both ears   History of asthma    no current med.   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)    Migraines    Pneumonia 12/2018   hx of    PONV (postoperative nausea and vomiting)    PTSD (post-traumatic stress disorder)    Seizures (HCC)    complex partial   Shoulder dislocation 09/2018   Left   Shoulder subluxation, left    Syncope    Tremor, unspecified  bilateral arms, comes and goes   Wears glasses    Wears hearing aid    bilateral    Past Surgical History:  Procedure Laterality Date   BIOPSY  10/20/2022   Procedure: BIOPSY;  Surgeon: Tressia Danas, MD;  Location: [facility];  Service: Gastroenterology;;   Fidela Salisbury RELEASE Right 08/18/2023   Procedure: CARPAL TUNNEL RELEASE;  Surgeon: Marcene Corning, MD;  Location: [facility];  Service: Orthopedics;  Laterality: Right;   CARPAL TUNNEL RELEASE Left 10/27/2023   Procedure: LEFT CARPAL TUNNEL RELEASE;  Surgeon: Marcene Corning, MD;  Location: [facility];  Service: Orthopedics;  Laterality: Left;   CHOLECYSTECTOMY N/A 10/05/2018   Procedure: LAPAROSCOPIC CHOLECYSTECTOMY WITH INTRAOPERATIVE CHOLANGIOGRAM ERAS PATHWAY;  Surgeon: Ovidio Kin, MD;   Location: [facility];  Service: General;  Laterality: N/A;   CYSTOSCOPY N/A 03/12/2021   Procedure: CYSTOSCOPY;  Surgeon: Edwinna Areola, DO;  Location: [facility];  Service: Gynecology;  Laterality: N/A;   ESOPHAGOGASTRODUODENOSCOPY (EGD) WITH PROPOFOL N/A 10/20/2022   Procedure: ESOPHAGOGASTRODUODENOSCOPY (EGD) WITH PROPOFOL;  Surgeon: Tressia Danas, MD;  Location: [facility];  Service: Gastroenterology;  Laterality: N/A;   LAPAROSCOPIC APPENDECTOMY  11/26/2011   Procedure: APPENDECTOMY LAPAROSCOPIC;  Surgeon: Atilano Ina, MD;  Location: [facility] OR;  Service: General;  Laterality: N/A;   left shoulder manipulation     in er multilple times last done july 2021   MASTECTOMY     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   NIPPLE SPARING MASTECTOMY Bilateral 02/25/2022   Procedure: BILATERAL NIPPLE-SPARING MASTECTOMY;  Surgeon: Almond Lint, MD;  Location: [facility];  Service: General;  Laterality: Bilateral;   PORT-A-CATH REMOVAL N/A 12/10/2021   Procedure: REMOVAL PORT-A-CATH AND REPLACEMENT;  Surgeon: Fritzi Mandes, MD;  Location: [facility];  Service: General;  Laterality: N/A;   PORTACATH PLACEMENT Right 08/28/2017   Procedure: POWER PORT PLACEMENT;  Surgeon: Ovidio Kin, MD;  Location: [facility];  Service: General;  Laterality: Right;   PORTACATH PLACEMENT N/A 12/04/2017   Procedure: INSERTION PORT-A-CATH;  Surgeon: Ovidio Kin, MD;  Location: [facility] OR;  Service: General;  Laterality: N/A;   RADIOLOGY WITH ANESTHESIA Left 10/25/2019   Procedure: MRI  LEFT SHOLDER WITHOUT CONTRAST;  Surgeon: Radiologist, Medication, MD;  Location: [facility];  Service: Radiology;  Laterality: Left;   RADIOLOGY WITH ANESTHESIA Bilateral 11/19/2021   Procedure: MRI WITH ANESTHESIA OF BILATERAL BREASTS WITH AND WITHOUT CONTRAST;  Surgeon: Radiologist, Medication, MD;  Location: [facility];  Service: Radiology;  Laterality: Bilateral;   RADIOLOGY WITH ANESTHESIA N/A 02/24/2023    Procedure: MRI LEFT SHOULDER WITHOUT CONTRAST WITH ANESTHESIA;  Surgeon: Radiologist, Medication, MD;  Location: [facility];  Service: Radiology;  Laterality: N/A;   REPAIR ANKLE LIGAMENT Left    x 3   TOTAL LAPAROSCOPIC HYSTERECTOMY WITH SALPINGECTOMY Bilateral 03/12/2021   Procedure: TOTAL LAPAROSCOPIC HYSTERECTOMY WITH SALPINGECTOMY;  Surgeon: Edwinna Areola, DO;  Location: [facility];  Service: Gynecology;  Laterality: Bilateral;    Family History  Problem Relation Age of Onset   Polymyalgia rheumatica Mother    Heart disease Mother    Sudden Cardiac Death Father        long QT syndrome    Heart disease Maternal Grandmother    Breast cancer Maternal Grandmother    Stomach cancer Maternal Grandmother    Heart disease Maternal Grandfather    Stroke Maternal Grandfather    Diabetes Maternal Grandfather    COPD Maternal Grandfather    Kidney  failure Maternal Grandfather    Colon cancer Paternal Grandmother 40   Heart disease Paternal Grandmother    Heart disease Paternal Grandfather    Migraines Paternal Grandfather    Esophageal cancer Cousin    Pancreatic cancer Neg Hx        Current Outpatient Medications:    acetaminophen (TYLENOL) 325 MG tablet, Take 2 tablets (650 mg total) by mouth every 6 (six) hours as needed (pain)., Disp: , Rfl:    acetaminophen-codeine (TYLENOL #3) 300-30 MG tablet, Take 1-2 tablets by mouth every 6 (six) hours as needed for moderate pain (pain score 4-6) or severe pain (pain score 7-10) (post op pain)., Disp: 10 tablet, Rfl: 0   albuterol (VENTOLIN HFA) 108 (90 Base) MCG/ACT inhaler, Inhale 2 puffs into the lungs every 6 (six) hours as needed for wheezing or shortness of breath., Disp: 6.7 g, Rfl: 3   buPROPion (WELLBUTRIN XL) 300 MG 24 hr tablet, Take 300 mg by mouth 2 (two) times daily., Disp: , Rfl:    busPIRone (BUSPAR) 15 MG tablet, Take 15 mg by mouth 2 (two) times daily., Disp: , Rfl:    Continuous Blood Gluc Sensor (FREESTYLE LIBRE 2 SENSOR)  MISC, USE WITH LIBRE APP, Disp: 6 each, Rfl: 2   Continuous Blood Gluc Transmit (DEXCOM G6 TRANSMITTER) MISC, Use with dexcom receiver and sensor, Disp: 1 each, Rfl: 3   fluticasone furoate-vilanterol (BREO ELLIPTA) 100-25 MCG/ACT AEPB, Inhale 1 puff into the lungs daily., Disp: 1 each, Rfl: 11   gabapentin (NEURONTIN) 100 MG capsule, Take 100 mg by mouth at bedtime., Disp: , Rfl:    Glucagon, rDNA, (GLUCAGON EMERGENCY) 1 MG KIT, Use PRN for hypoglycemia, Disp: 1 kit, Rfl: 3   glucose monitoring kit (FREESTYLE) monitoring kit, 1 each by Does not apply route as needed for other. Free style libre on right arm, Disp: , Rfl:    Lacosamide (VIMPAT) 100 MG TABS, Take 1 tablet in AM, 2 tablets in PM (Patient taking differently: Take 3 tablets by mouth See admin instructions. Take 1 tablet in AM, 2 tablets in PM), Disp: 270 tablet, Rfl: 3   LORazepam (ATIVAN) 0.5 MG tablet, Take 1 tablet as needed for seizure. Do not take more than 2-3 a week. (Patient taking differently: Take 0.5 mg by mouth See admin instructions. Take 1 tablet as needed for seizure. Do not take more than 2-3 a week.), Disp: 10 tablet, Rfl: 5   montelukast (SINGULAIR) 10 MG tablet, TAKE 1 TABLET BY MOUTH EVERYDAY AT BEDTIME (Patient taking differently: Take 10 mg by mouth at bedtime.), Disp: 90 tablet, Rfl: 3   MOTEGRITY 2 MG TABS, TAKE 1 TABLET BY MOUTH EVERY DAY (Patient taking differently: Take 2 mg by mouth daily.), Disp: 30 tablet, Rfl: 5   perampanel (FYCOMPA) 8 MG tablet, Take 1 tablet (8 mg total) by mouth at bedtime. Take 1 tablet every night, Disp: 90 tablet, Rfl: 3   topiramate (TOPAMAX) 100 MG tablet, Take 1 and 1/2 tablets (150mg ) twice a day, Disp: 270 tablet, Rfl: 3  Current Facility-Administered Medications:    alteplase (CATHFLO ACTIVASE) injection 2 mg, 2 mg, Intracatheter, Once, [name], Kandee Keen, NP  EXAM:  VITALS per patient if applicable:  GENERAL: alert, oriented, appears well and in no acute distress  HEENT:  atraumatic, conjunttiva clear, no obvious abnormalities on inspection of external nose and ears  NECK: normal movements of the head and neck  LUNGS: on inspection no signs of respiratory distress, breathing rate appears normal, no  obvious gross SOB, gasping or wheezing  CV: no obvious cyanosis  MS: moves all visible extremities without noticeable abnormality  PSYCH/NEURO: pleasant and cooperative, no obvious depression or anxiety, speech and thought processing grossly intact  ASSESSMENT AND PLAN:  Discussed the following assessment and plan:  1. Respiratory infection (Primary) -Strep throat versus pneumonia versus viral URI.  I am going to have her come into the office tomorrow for further evaluation and treatment.  2. Nausea and vomiting, unspecified vomiting type  - Ambulatory referral to Gastroenterology  3. Diarrhea, unspecified type  - Ambulatory referral to Gastroenterology      I discussed the assessment and treatment plan with the patient. The patient was provided an opportunity to ask questions and all were answered. The patient agreed with the plan and demonstrated an understanding of the instructions.   The patient was advised to call back or seek an in-person evaluation if the symptoms worsen or if the condition fails to improve as anticipated.   Shirline Frees, NP   Time spent with patient today was 45 minutes which consisted of chart review, discussing respiratory infection and GI complaints, work up, treatment, listening, answering questions and documentation.

## 2023-12-03 ENCOUNTER — Ambulatory Visit: Payer: BC Managed Care – PPO | Admitting: Adult Health

## 2023-12-03 ENCOUNTER — Other Ambulatory Visit: Payer: Self-pay | Admitting: Adult Health

## 2023-12-03 DIAGNOSIS — F840 Autistic disorder: Secondary | ICD-10-CM | POA: Diagnosis not present

## 2023-12-03 DIAGNOSIS — F4312 Post-traumatic stress disorder, chronic: Secondary | ICD-10-CM | POA: Diagnosis not present

## 2023-12-03 DIAGNOSIS — R112 Nausea with vomiting, unspecified: Secondary | ICD-10-CM

## 2023-12-04 NOTE — Addendum Note (Signed)
Addended by: Elease Etienne A on: 12/04/2023 01:05 PM   Modules accepted: Orders

## 2023-12-04 NOTE — Progress Notes (Signed)
[facility] Summary Report / Loop Recorder 

## 2023-12-07 DIAGNOSIS — F840 Autistic disorder: Secondary | ICD-10-CM | POA: Diagnosis not present

## 2023-12-07 DIAGNOSIS — F4312 Post-traumatic stress disorder, chronic: Secondary | ICD-10-CM | POA: Diagnosis not present

## 2023-12-17 DIAGNOSIS — F4312 Post-traumatic stress disorder, chronic: Secondary | ICD-10-CM | POA: Diagnosis not present

## 2023-12-17 DIAGNOSIS — F840 Autistic disorder: Secondary | ICD-10-CM | POA: Diagnosis not present

## 2023-12-18 ENCOUNTER — Ambulatory Visit (INDEPENDENT_AMBULATORY_CARE_PROVIDER_SITE_OTHER): Payer: BC Managed Care – PPO

## 2023-12-18 DIAGNOSIS — R55 Syncope and collapse: Secondary | ICD-10-CM | POA: Diagnosis not present

## 2023-12-18 LAB — CUP PACEART REMOTE DEVICE CHECK
Date Time Interrogation Session: 20250102230803
Implantable Pulse Generator Implant Date: 20231117

## 2023-12-21 DIAGNOSIS — F4312 Post-traumatic stress disorder, chronic: Secondary | ICD-10-CM | POA: Diagnosis not present

## 2023-12-21 DIAGNOSIS — F840 Autistic disorder: Secondary | ICD-10-CM | POA: Diagnosis not present

## 2023-12-24 DIAGNOSIS — F4312 Post-traumatic stress disorder, chronic: Secondary | ICD-10-CM | POA: Diagnosis not present

## 2023-12-24 DIAGNOSIS — F840 Autistic disorder: Secondary | ICD-10-CM | POA: Diagnosis not present

## 2023-12-28 DIAGNOSIS — F840 Autistic disorder: Secondary | ICD-10-CM | POA: Diagnosis not present

## 2023-12-28 DIAGNOSIS — F4312 Post-traumatic stress disorder, chronic: Secondary | ICD-10-CM | POA: Diagnosis not present

## 2023-12-31 ENCOUNTER — Encounter: Payer: Self-pay | Admitting: Neurology

## 2023-12-31 DIAGNOSIS — F840 Autistic disorder: Secondary | ICD-10-CM | POA: Diagnosis not present

## 2023-12-31 DIAGNOSIS — F4312 Post-traumatic stress disorder, chronic: Secondary | ICD-10-CM | POA: Diagnosis not present

## 2024-01-01 ENCOUNTER — Encounter (HOSPITAL_COMMUNITY): Payer: BC Managed Care – PPO

## 2024-01-01 DIAGNOSIS — F4312 Post-traumatic stress disorder, chronic: Secondary | ICD-10-CM | POA: Diagnosis not present

## 2024-01-01 DIAGNOSIS — F840 Autistic disorder: Secondary | ICD-10-CM | POA: Diagnosis not present

## 2024-01-04 DIAGNOSIS — F4312 Post-traumatic stress disorder, chronic: Secondary | ICD-10-CM | POA: Diagnosis not present

## 2024-01-04 DIAGNOSIS — F840 Autistic disorder: Secondary | ICD-10-CM | POA: Diagnosis not present

## 2024-01-05 ENCOUNTER — Other Ambulatory Visit: Payer: Self-pay | Admitting: General Surgery

## 2024-01-05 DIAGNOSIS — R030 Elevated blood-pressure reading, without diagnosis of hypertension: Secondary | ICD-10-CM | POA: Diagnosis not present

## 2024-01-05 DIAGNOSIS — Z1501 Genetic susceptibility to malignant neoplasm of breast: Secondary | ICD-10-CM | POA: Diagnosis not present

## 2024-01-05 DIAGNOSIS — E8842 MERRF syndrome: Secondary | ICD-10-CM | POA: Diagnosis not present

## 2024-01-05 DIAGNOSIS — Z9013 Acquired absence of bilateral breasts and nipples: Secondary | ICD-10-CM

## 2024-01-05 DIAGNOSIS — F4312 Post-traumatic stress disorder, chronic: Secondary | ICD-10-CM | POA: Diagnosis not present

## 2024-01-05 DIAGNOSIS — Z1509 Genetic susceptibility to other malignant neoplasm: Secondary | ICD-10-CM

## 2024-01-05 DIAGNOSIS — F840 Autistic disorder: Secondary | ICD-10-CM | POA: Diagnosis not present

## 2024-01-07 DIAGNOSIS — F4312 Post-traumatic stress disorder, chronic: Secondary | ICD-10-CM | POA: Diagnosis not present

## 2024-01-07 DIAGNOSIS — F840 Autistic disorder: Secondary | ICD-10-CM | POA: Diagnosis not present

## 2024-01-11 DIAGNOSIS — F840 Autistic disorder: Secondary | ICD-10-CM | POA: Diagnosis not present

## 2024-01-11 DIAGNOSIS — F50022 Anorexia nervosa, binge eating/purging type, severe: Secondary | ICD-10-CM | POA: Diagnosis not present

## 2024-01-11 DIAGNOSIS — F4312 Post-traumatic stress disorder, chronic: Secondary | ICD-10-CM | POA: Diagnosis not present

## 2024-01-14 DIAGNOSIS — F50022 Anorexia nervosa, binge eating/purging type, severe: Secondary | ICD-10-CM | POA: Diagnosis not present

## 2024-01-14 DIAGNOSIS — F4312 Post-traumatic stress disorder, chronic: Secondary | ICD-10-CM | POA: Diagnosis not present

## 2024-01-14 DIAGNOSIS — F840 Autistic disorder: Secondary | ICD-10-CM | POA: Diagnosis not present

## 2024-01-18 DIAGNOSIS — F840 Autistic disorder: Secondary | ICD-10-CM | POA: Diagnosis not present

## 2024-01-18 DIAGNOSIS — F50022 Anorexia nervosa, binge eating/purging type, severe: Secondary | ICD-10-CM | POA: Diagnosis not present

## 2024-01-18 DIAGNOSIS — F4312 Post-traumatic stress disorder, chronic: Secondary | ICD-10-CM | POA: Diagnosis not present

## 2024-01-21 DIAGNOSIS — F840 Autistic disorder: Secondary | ICD-10-CM | POA: Diagnosis not present

## 2024-01-21 DIAGNOSIS — F4312 Post-traumatic stress disorder, chronic: Secondary | ICD-10-CM | POA: Diagnosis not present

## 2024-01-22 ENCOUNTER — Ambulatory Visit (INDEPENDENT_AMBULATORY_CARE_PROVIDER_SITE_OTHER): Payer: BC Managed Care – PPO

## 2024-01-22 DIAGNOSIS — R55 Syncope and collapse: Secondary | ICD-10-CM | POA: Diagnosis not present

## 2024-01-22 NOTE — Progress Notes (Signed)
 [facility] Summary Report / Loop Recorder

## 2024-01-23 LAB — CUP PACEART REMOTE DEVICE CHECK
Date Time Interrogation Session: 20250206231006
Implantable Pulse Generator Implant Date: 20231117

## 2024-01-25 DIAGNOSIS — F4312 Post-traumatic stress disorder, chronic: Secondary | ICD-10-CM | POA: Diagnosis not present

## 2024-01-25 DIAGNOSIS — F840 Autistic disorder: Secondary | ICD-10-CM | POA: Diagnosis not present

## 2024-01-28 DIAGNOSIS — F4312 Post-traumatic stress disorder, chronic: Secondary | ICD-10-CM | POA: Diagnosis not present

## 2024-01-28 DIAGNOSIS — F840 Autistic disorder: Secondary | ICD-10-CM | POA: Diagnosis not present

## 2024-01-31 ENCOUNTER — Encounter: Payer: Self-pay | Admitting: Internal Medicine

## 2024-02-01 DIAGNOSIS — F4312 Post-traumatic stress disorder, chronic: Secondary | ICD-10-CM | POA: Diagnosis not present

## 2024-02-01 DIAGNOSIS — F840 Autistic disorder: Secondary | ICD-10-CM | POA: Diagnosis not present

## 2024-02-04 DIAGNOSIS — F4312 Post-traumatic stress disorder, chronic: Secondary | ICD-10-CM | POA: Diagnosis not present

## 2024-02-04 DIAGNOSIS — F840 Autistic disorder: Secondary | ICD-10-CM | POA: Diagnosis not present

## 2024-02-08 DIAGNOSIS — F4312 Post-traumatic stress disorder, chronic: Secondary | ICD-10-CM | POA: Diagnosis not present

## 2024-02-08 DIAGNOSIS — F840 Autistic disorder: Secondary | ICD-10-CM | POA: Diagnosis not present

## 2024-02-10 ENCOUNTER — Telehealth: Payer: Self-pay

## 2024-02-10 ENCOUNTER — Non-Acute Institutional Stay (HOSPITAL_COMMUNITY)
Admission: RE | Admit: 2024-02-10 | Discharge: 2024-02-10 | Disposition: A | Discharge status: Home / Self Care | Payer: BC Managed Care – PPO | Source: Ambulatory Visit | Attending: Internal Medicine | Admitting: Internal Medicine

## 2024-02-10 DIAGNOSIS — F4312 Post-traumatic stress disorder, chronic: Secondary | ICD-10-CM | POA: Diagnosis not present

## 2024-02-10 DIAGNOSIS — Z452 Encounter for adjustment and management of vascular access device: Secondary | ICD-10-CM | POA: Diagnosis not present

## 2024-02-10 DIAGNOSIS — F840 Autistic disorder: Secondary | ICD-10-CM | POA: Diagnosis not present

## 2024-02-10 MED ORDER — SODIUM CHLORIDE 0.9% FLUSH
10.0000 mL | INTRAVENOUS | Status: AC | PRN
  Administered 2024-02-10: 10 mL

## 2024-02-10 MED ORDER — HEPARIN SOD (PORK) LOCK FLUSH 100 UNIT/ML IV SOLN
500.0000 [IU] | INTRAVENOUS | Status: AC | PRN
  Administered 2024-02-10: 500 [IU]
  Filled 2024-02-10: qty 5

## 2024-02-10 NOTE — Progress Notes (Signed)
 PATIENT [facility]ARE CENTER NOTE:  Provider:  Shirline Frees NP     Procedure: Port-a-cath flush     Note: Patient's PAC accessed using sterile technique, flushed with 10 cc 0.9% sodium chloride and heparin. Blood return noted. PAC de-accessed and site covered with gauze and band aid. Patient tolerated well. Pt declined AVS. Pt to RTC 04/06/24, pt states she is having a procedure done next month where her port will be accessed. Alert, oriented and ambulatory at discharge, accompanied by support person.

## 2024-02-10 NOTE — Telephone Encounter (Signed)
 Called pt to schedule an appt. For surgical clearance. No answer vm left.

## 2024-02-11 ENCOUNTER — Ambulatory Visit (HOSPITAL_COMMUNITY): Payer: BC Managed Care – PPO

## 2024-02-11 DIAGNOSIS — F50022 Anorexia nervosa, binge eating/purging type, severe: Secondary | ICD-10-CM | POA: Diagnosis not present

## 2024-02-11 DIAGNOSIS — F840 Autistic disorder: Secondary | ICD-10-CM | POA: Diagnosis not present

## 2024-02-11 DIAGNOSIS — F4312 Post-traumatic stress disorder, chronic: Secondary | ICD-10-CM | POA: Diagnosis not present

## 2024-02-11 NOTE — Telephone Encounter (Signed)
 Pt has been scheduled for 02/17/2024. No further action needed.

## 2024-02-12 DIAGNOSIS — H903 Sensorineural hearing loss, bilateral: Secondary | ICD-10-CM | POA: Diagnosis not present

## 2024-02-15 DIAGNOSIS — F4312 Post-traumatic stress disorder, chronic: Secondary | ICD-10-CM | POA: Diagnosis not present

## 2024-02-15 DIAGNOSIS — F50022 Anorexia nervosa, binge eating/purging type, severe: Secondary | ICD-10-CM | POA: Diagnosis not present

## 2024-02-15 DIAGNOSIS — F840 Autistic disorder: Secondary | ICD-10-CM | POA: Diagnosis not present

## 2024-02-17 ENCOUNTER — Ambulatory Visit (INDEPENDENT_AMBULATORY_CARE_PROVIDER_SITE_OTHER): Payer: BC Managed Care – PPO | Admitting: Adult Health

## 2024-02-17 ENCOUNTER — Encounter: Payer: Self-pay | Admitting: Adult Health

## 2024-02-17 VITALS — BP 90/68 | HR 64 | Temp 97.5°F | Ht 64.0 in | Wt 96.0 lb

## 2024-02-17 DIAGNOSIS — I959 Hypotension, unspecified: Secondary | ICD-10-CM | POA: Diagnosis not present

## 2024-02-17 DIAGNOSIS — R112 Nausea with vomiting, unspecified: Secondary | ICD-10-CM

## 2024-02-17 DIAGNOSIS — Z01818 Encounter for other preprocedural examination: Secondary | ICD-10-CM | POA: Diagnosis not present
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
## 2024-02-18 ENCOUNTER — Other Ambulatory Visit: Payer: Self-pay | Admitting: Neurology

## 2024-02-18 DIAGNOSIS — F50022 Anorexia nervosa, binge eating/purging type, severe: Secondary | ICD-10-CM | POA: Diagnosis not present

## 2024-02-18 DIAGNOSIS — F4312 Post-traumatic stress disorder, chronic: Secondary | ICD-10-CM | POA: Diagnosis not present

## 2024-02-18 DIAGNOSIS — G40019 Localization-related (focal) (partial) idiopathic epilepsy and epileptic syndromes with seizures of localized onset, intractable, without status epilepticus: Secondary | ICD-10-CM

## 2024-02-18 DIAGNOSIS — F840 Autistic disorder: Secondary | ICD-10-CM | POA: Diagnosis not present

## 2024-02-20 DIAGNOSIS — F840 Autistic disorder: Secondary | ICD-10-CM | POA: Diagnosis not present

## 2024-02-20 DIAGNOSIS — F50022 Anorexia nervosa, binge eating/purging type, severe: Secondary | ICD-10-CM | POA: Diagnosis not present

## 2024-02-20 DIAGNOSIS — F4312 Post-traumatic stress disorder, chronic: Secondary | ICD-10-CM | POA: Diagnosis not present

## 2024-02-22 DIAGNOSIS — F840 Autistic disorder: Secondary | ICD-10-CM | POA: Diagnosis not present

## 2024-02-22 DIAGNOSIS — F50022 Anorexia nervosa, binge eating/purging type, severe: Secondary | ICD-10-CM | POA: Diagnosis not present

## 2024-02-22 DIAGNOSIS — F4312 Post-traumatic stress disorder, chronic: Secondary | ICD-10-CM | POA: Diagnosis not present

## 2024-02-24 ENCOUNTER — Other Ambulatory Visit: Payer: Self-pay

## 2024-02-24 ENCOUNTER — Encounter (HOSPITAL_COMMUNITY): Payer: Self-pay | Admitting: *Deleted

## 2024-02-24 NOTE — Anesthesia Preprocedure Evaluation (Signed)
 Anesthesia Evaluation  Patient identified by MRN, date of birth, ID band Patient awake    Reviewed: Allergy & Precautions, NPO status , Patient's Chart, lab work & pertinent test results  History of Anesthesia Complications (+) PONV and history of anesthetic complications (epilepsy poorly controlled- has seizures after most anesthetics)  Airway Mallampati: II  TM Distance: >3 FB Neck ROM: Full    Dental  (+) Teeth Intact, Dental Advisory Given Upper and lower braces:   Pulmonary asthma    Pulmonary exam normal breath sounds clear to auscultation       Cardiovascular negative cardio ROS Normal cardiovascular exam Rhythm:Regular Rate:Normal  TTE 2019 - Left ventricle: The cavity size was normal. Systolic function was    normal. The estimated ejection fraction was in the range of 60%    to 65%. Wall motion was normal; there were no regional wall    motion abnormalities. Left ventricular diastolic function    parameters were normal.  - Aortic valve: Transvalvular velocity was within the normal range.    There was no stenosis. There was no regurgitation.  - Mitral valve: Transvalvular velocity was within the normal range.    There was no evidence for stenosis. There was no regurgitation.  - Right ventricle: The cavity size was normal. Wall thickness was    normal. Systolic function was normal.  - Pulmonary arteries: Systolic pressure was within the normal    range. PA peak pressure: 20 mm Hg (S).     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 Received K. Repeat 2.6  Reproductive/Obstetrics negative OB ROS                             Anesthesia Physical Anesthesia Plan  ASA: 3  Anesthesia Plan: General   Post-op Pain Management: Precedex   Induction: Intravenous  PONV Risk Score and Plan: 4 or greater and Midazolam, Dexamethasone and Ondansetron  Airway Management Planned: Oral ETT  Additional Equipment: None  Intra-op Plan:   Post-operative Plan: Extubation in OR  Informed Consent: I have reviewed the patients History and Physical, chart, labs and discussed the procedure including the risks, benefits and alternatives for the proposed anesthesia with the patient or authorized representative who has indicated his/her understanding and acceptance.     Dental advisory given  Plan Discussed with: CRNA  Anesthesia Plan Comments: (  )       Anesthesia Quick Evaluation

## 2024-02-24 NOTE — Progress Notes (Signed)
 PCP - Shirline Frees, NP Cardiologist - Dr. Carolan Clines  PPM/ICD - denies  Pt does have loop recorder  Chest x-ray - 12/10/21 EKG - 10/15/23 (had one recently on 3/5 w/ PCP but not released) Stress Test - denies ECHO - 10/04/18 Cardiac Cath - denies  CPAP - denies  DM- denies Pt states she has hypoglycemia and that's why she wears the continuous monitor. She says her fasting levels are usually in the 70's and the monitor is currently on her left arm.  ASA/Blood Thinner Instructions: n/a   ERAS Protcol - clears until 0815  COVID TEST- n/a  Anesthesia review: yes, hx of syncope, seizures, sees Dr. Wyline Mood w/ cardiology. Pt also requested that her port be used for anesthesia. She said they have used it in the past. And she would like to have Dr. Armond Hang since she has had her before and is comfortable with her   Patient verbally denies any shortness of breath, fever, cough and chest pain during phone call      Questions were answered. Patient verbalized understanding of instructions.

## 2024-02-24 NOTE — Addendum Note (Signed)
 Addended by: Elease Etienne A on: 02/24/2024 10:04 AM   Modules accepted: Orders

## 2024-02-24 NOTE — Progress Notes (Signed)
 Anesthesia Chart Review: Same day workup  33 year old female with complex history of seizures starting at age 41.  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  She is currently followed by Dr. Karel Jarvis for this. She primarily has complex partial seizures that last for ~30 seconds with staring, drooling, mild limb jerking and self terminate.  She is currently maintained on Keppra, Vimpat, and Fycompa.  She was last seen by Dr. Karel Jarvis on 06/26/2023.  At that time she reported that she felt like her seizures are under the best control that ever been.  Medication regimen of Vimpat 100 mg in the morning, 200 mg in the evening, Fycompa 8 mg at bedtime, topiramate 100 mg at bedtime.  Patient expressed she wanted to wean off Keppra at that time, schedule with provided by Dr. Karel Jarvis.  Patient has previously reported that she typically has 2-4 complex partial seizures in PACU after any surgery lasting ~30 seconds each. Patient did experience postoperative seizures in the PACU following her bilateral mastectomy 02/27/2022.  Per anesthesia post procedure note by Dr. Armond Hang, " Pt experienced multiple seizures in PACU similar to all her previous anesthetics. Treated with versed and ativan. Longest seizure lasted ~1 min. No respiratory compromise. Vital signs remained stable. After final seizure, she was monitored in PACU for ~1 hour. She was discharged home in stable condition."  Patient had EGD with propofol on 10/20/2022, per anesthesia post procedure note by Dr. Salvadore Farber at that time, "Mild seizure-like activity in PACU resolved with IV ativan, seemingly similar to her other PACU stays. Long PACU stay similar to other anesthetics d/t lethargy from repeated doses of ativan.  No seizure activity was documented following left shoulder MRI 02/24/2023, right carpal tunnel release 08/18/2023, or left carpal tunnel release 10/27/2023.   Patient  has a port at the right chest that was placed for history of difficult venous access.  She states that for her last several surgeries she has been given medication through her port to help her relax prior to peripheral IV access being obtained and that this has worked well.  Ultimately, she would prefer to have no peripheral IV access placed if not necessary.  She also has a history of severe anxiety and PTSD and is accompanied to all appointments by her therapist, Trula Ore.  Patient follows with cardiology for history of syncope.  She is status post ILR placement 11/2022 with no significant findings to date.  Prior echo 09/2018 showed EF 60 to 65%, normal valves.   Other pertinent history includes postoperative nausea and vomiting, bilateral hearing loss (wears hearing aids), asthma, GERD, she wears a Dexcom for history of hypoglycemia.    She will need DOS labs and eval.    EKG 10/15/23: Normal sinus rhythm. Rate 63. Borderline criteria for Rightward axis   TTE 10/04/2018: - Left ventricle: The cavity size was normal. Systolic function was    normal. The estimated ejection fraction was in the range of 60%    to 65%. Wall motion was normal; there were no regional wall    motion abnormalities. Left ventricular diastolic function    parameters were normal.  - Aortic valve: Transvalvular velocity was within the normal range.    There was no stenosis. There was no regurgitation.  - Mitral valve: Transvalvular velocity was within the normal range.    There was no evidence for stenosis. There was no regurgitation.  - Right ventricle: The  cavity size was normal. Wall thickness was    normal. Systolic function was normal.  - Pulmonary arteries: Systolic pressure was within the normal    range. PA peak pressure: 20 mm Hg (S).       Zannie Cove [facility] Short Stay Center/Anesthesiology Phone [phone] 02/24/2024 3:28 PM

## 2024-02-24 NOTE — Pre-Procedure Instructions (Signed)
-------------    SDW INSTRUCTIONS given:  Your procedure is scheduled on 3/13.  Report to [facility] Main Entrance "A" at 08:45 A.M., and check in at the Admitting office.  Any questions or running late day of surgery: call [phone]    Remember:  Do not eat after midnight the night before your surgery  You may drink clear liquids until 08:15 AM the morning of your surgery.   Clear liquids allowed are: Water, Non-Citrus Juices (without pulp), Carbonated Beverages, Clear Tea, Black Coffee Only, and Gatorade    Take these medicines the morning of surgery with A SIP OF WATER  Bupropion Buspirone Breo ellipta Vimpat Topiramate      May take these medicines IF NEEDED: Tylenol  Albuterol- bring inhaler with you Ativan  As of today, STOP taking any Aspirin (unless otherwise instructed by your surgeon) Aleve, Naproxen, Ibuprofen, Motrin, Advil, Goody's, BC's, all herbal medications, fish oil, and all vitamins.   Do NOT Smoke (Tobacco/Vaping) 24 hours prior to your procedure  If you use a CPAP at night, you may bring all equipment for your overnight stay.     You will be asked to remove any contacts, glasses, piercing's, hearing aid's, dentures/partials prior to surgery. Please bring cases for these items if needed.     Patients discharged the day of surgery will not be allowed to drive home, and someone needs to stay with them for 24 hours.  SURGICAL WAITING ROOM VISITATION Patients may have no more than 2 support people in the waiting area - these visitors may rotate.   Pre-op nurse will coordinate an appropriate time for 1 ADULT support person, who may not rotate, to accompany patient in pre-op.  Children under the age of 48 must have an adult with them who is not the patient and must remain in the main waiting area with an adult.  If the patient needs to stay at the hospital during part of their recovery, the visitor guidelines for inpatient rooms apply.  Please refer to  the [facility] website for the visitor guidelines for any additional information.   Special instructions:   [place]- Preparing For Surgery   Please follow these instructions carefully.   Shower the NIGHT BEFORE SURGERY and the MORNING OF SURGERY with DIAL Soap.   Pat yourself dry with a CLEAN TOWEL.  Wear CLEAN PAJAMAS to bed the night before surgery  Place CLEAN SHEETS on your bed the night of your first shower and DO NOT SLEEP WITH PETS.   Additional instructions for the day of surgery: DO NOT APPLY any lotions, deodorants, cologne, or perfumes.   Do not wear jewelry or makeup Do not wear nail polish, gel polish, artificial nails, or any other type of covering on natural nails (fingers and toes) Do not bring valuables to the hospital. [facility] is not responsible for valuables/personal belongings. Put on clean/comfortable clothes.  Please brush your teeth.  Ask your nurse before applying any prescription medications to the skin.

## 2024-02-24 NOTE — Progress Notes (Signed)
 [facility] Summary Report / Loop Recorder

## 2024-02-25 ENCOUNTER — Encounter (HOSPITAL_COMMUNITY): Payer: Self-pay | Admitting: General Surgery

## 2024-02-25 ENCOUNTER — Ambulatory Visit (HOSPITAL_COMMUNITY)
Admission: RE | Admit: 2024-02-25 | Discharge: 2024-02-25 | Disposition: A | Discharge status: Home / Self Care | Payer: BC Managed Care – PPO | Source: Ambulatory Visit | Attending: General Surgery | Admitting: General Surgery

## 2024-02-25 ENCOUNTER — Encounter (HOSPITAL_COMMUNITY)
Admission: RE | Disposition: A | Discharge status: Home / Self Care | Payer: Self-pay | Source: Home / Self Care | Attending: General Surgery

## 2024-02-25 ENCOUNTER — Ambulatory Visit (HOSPITAL_COMMUNITY): Payer: Self-pay | Admitting: Physician Assistant

## 2024-02-25 ENCOUNTER — Ambulatory Visit (HOSPITAL_COMMUNITY)
Admission: RE | Admit: 2024-02-25 | Discharge: 2024-02-25 | Disposition: A | Discharge status: Home / Self Care | Payer: BC Managed Care – PPO | Attending: General Surgery | Admitting: General Surgery

## 2024-02-25 ENCOUNTER — Other Ambulatory Visit: Payer: Self-pay

## 2024-02-25 DIAGNOSIS — Z681 Body mass index (BMI) 19 or less, adult: Secondary | ICD-10-CM | POA: Insufficient documentation

## 2024-02-25 DIAGNOSIS — E8842 MERRF syndrome: Secondary | ICD-10-CM | POA: Diagnosis not present

## 2024-02-25 DIAGNOSIS — Z1239 Encounter for other screening for malignant neoplasm of breast: Secondary | ICD-10-CM | POA: Insufficient documentation

## 2024-02-25 DIAGNOSIS — J45909 Unspecified asthma, uncomplicated: Secondary | ICD-10-CM | POA: Insufficient documentation

## 2024-02-25 DIAGNOSIS — R519 Headache, unspecified: Secondary | ICD-10-CM | POA: Diagnosis not present

## 2024-02-25 DIAGNOSIS — K219 Gastro-esophageal reflux disease without esophagitis: Secondary | ICD-10-CM | POA: Diagnosis not present

## 2024-02-25 DIAGNOSIS — R634 Abnormal weight loss: Secondary | ICD-10-CM | POA: Insufficient documentation

## 2024-02-25 DIAGNOSIS — Z1501 Genetic susceptibility to malignant neoplasm of breast: Secondary | ICD-10-CM | POA: Insufficient documentation

## 2024-02-25 DIAGNOSIS — Z9010 Acquired absence of unspecified breast and nipple: Secondary | ICD-10-CM | POA: Diagnosis not present

## 2024-02-25 DIAGNOSIS — F419 Anxiety disorder, unspecified: Secondary | ICD-10-CM | POA: Diagnosis not present

## 2024-02-25 DIAGNOSIS — R2231 Localized swelling, mass and lump, right upper limb: Secondary | ICD-10-CM | POA: Diagnosis not present

## 2024-02-25 DIAGNOSIS — Z9013 Acquired absence of bilateral breasts and nipples: Secondary | ICD-10-CM

## 2024-02-25 HISTORY — PX: RADIOLOGY WITH ANESTHESIA: SHX6223

## 2024-02-25 LAB — BASIC METABOLIC PANEL
Anion gap: 13 (ref 5–15)
BUN: 16 mg/dL (ref 6–20)
CO2: 19 mmol/L — ABNORMAL LOW (ref 22–32)
Calcium: 8 mg/dL — ABNORMAL LOW (ref 8.9–10.3)
Chloride: 104 mmol/L (ref 98–111)
Creatinine, Ser: 0.78 mg/dL (ref 0.44–1.00)
GFR, Estimated: >60 mL/min (ref >60)
Glucose, Bld: 83 mg/dL (ref 70–99)
Potassium: 2.3 mmol/L — CL (ref 3.5–5.1)
Sodium: 136 mmol/L (ref 135–145)

## 2024-02-25 LAB — CBC
HCT: 35.4 % — ABNORMAL LOW (ref 36.0–46.0)
Hemoglobin: 12.6 g/dL (ref 12.0–15.0)
MCH: 29.9 pg (ref 26.0–34.0)
MCHC: 35.6 g/dL (ref 30.0–36.0)
MCV: 84.1 fL (ref 80.0–100.0)
Platelets: 370 10*3/uL (ref 150–400)
RBC: 4.21 MIL/uL (ref 3.87–5.11)
RDW: 12.6 % (ref 11.5–15.5)
WBC: 5.3 10*3/uL (ref 4.0–10.5)
nRBC: 0 % (ref 0.0–0.2)

## 2024-02-25 SURGERY — MRI WITH ANESTHESIA
Anesthesia: General | Laterality: Bilateral

## 2024-02-25 MED ORDER — POTASSIUM CHLORIDE 10 MEQ/50ML IV SOLN
10.0000 meq | INTRAVENOUS | Status: AC
  Administered 2024-02-25 (×2): 10 meq via INTRAVENOUS
  Filled 2024-02-25: qty 50

## 2024-02-25 MED ORDER — SUCCINYLCHOLINE CHLORIDE 200 MG/10ML IV SOSY
PREFILLED_SYRINGE | INTRAVENOUS | Status: DC | PRN
  Administered 2024-02-25: 100 mg via INTRAVENOUS

## 2024-02-25 MED ORDER — PHENYLEPHRINE 80 MCG/ML (10ML) SYRINGE FOR IV PUSH (FOR BLOOD PRESSURE SUPPORT)
PREFILLED_SYRINGE | INTRAVENOUS | Status: DC | PRN
  Administered 2024-02-25: 80 ug via INTRAVENOUS

## 2024-02-25 MED ORDER — ROCURONIUM BROMIDE 10 MG/ML (PF) SYRINGE
PREFILLED_SYRINGE | INTRAVENOUS | Status: DC | PRN
  Administered 2024-02-25: 20 mg via INTRAVENOUS
  Administered 2024-02-25: 10 mg via INTRAVENOUS

## 2024-02-25 MED ORDER — MIDAZOLAM HCL 2 MG/2ML IJ SOLN
INTRAMUSCULAR | Status: AC
  Filled 2024-02-25: qty 6

## 2024-02-25 MED ORDER — GADOBUTROL 1 MMOL/ML IV SOLN
4.5000 mL | Freq: Once | INTRAVENOUS | Status: AC | PRN
  Administered 2024-02-25: 4.5 mL via INTRAVENOUS

## 2024-02-25 MED ORDER — ORAL CARE MOUTH RINSE: 15.0000 mL | Freq: Once | OROMUCOSAL | Status: AC

## 2024-02-25 MED ORDER — LIDOCAINE 2% (20 MG/ML) 5 ML SYRINGE
INTRAMUSCULAR | Status: DC | PRN
  Administered 2024-02-25: 20 mg via INTRAVENOUS

## 2024-02-25 MED ORDER — EPHEDRINE SULFATE-NACL 50-0.9 MG/10ML-% IV SOSY
PREFILLED_SYRINGE | INTRAVENOUS | Status: DC | PRN
  Administered 2024-02-25: 5 mg via INTRAVENOUS
  Administered 2024-02-25: 10 mg via INTRAVENOUS

## 2024-02-25 MED ORDER — ONDANSETRON HCL 4 MG/2ML IJ SOLN
INTRAMUSCULAR | Status: DC | PRN
  Administered 2024-02-25: 4 mg via INTRAVENOUS

## 2024-02-25 MED ORDER — HEPARIN SOD (PORK) LOCK FLUSH 100 UNIT/ML IV SOLN
500.0000 [IU] | INTRAVENOUS | Status: AC | PRN
  Administered 2024-02-25: 500 [IU]

## 2024-02-25 MED ORDER — DEXMEDETOMIDINE HCL IN NACL 80 MCG/20ML IV SOLN
INTRAVENOUS | Status: DC | PRN
  Administered 2024-02-25 (×2): 20 ug via INTRAVENOUS

## 2024-02-25 MED ORDER — CHLORHEXIDINE GLUCONATE 0.12 % MT SOLN
OROMUCOSAL | Status: AC
  Filled 2024-02-25: qty 15

## 2024-02-25 MED ORDER — POTASSIUM CHLORIDE 10 MEQ/100ML IV SOLN
INTRAVENOUS | Status: AC
  Filled 2024-02-25: qty 100

## 2024-02-25 MED ORDER — CHLORHEXIDINE GLUCONATE 0.12 % MT SOLN
15.0000 mL | Freq: Once | OROMUCOSAL | Status: AC
  Administered 2024-02-25: 15 mL via OROMUCOSAL

## 2024-02-25 MED ORDER — MIDAZOLAM HCL 2 MG/2ML IJ SOLN
INTRAMUSCULAR | Status: DC | PRN
  Administered 2024-02-25: 4 mg via INTRAVENOUS

## 2024-02-25 MED ORDER — POTASSIUM CHLORIDE 10 MEQ/50ML IV SOLN
20.0000 meq | INTRAVENOUS | Status: DC
  Filled 2024-02-25 (×2): qty 100

## 2024-02-25 MED ORDER — SUGAMMADEX SODIUM 200 MG/2ML IV SOLN
INTRAVENOUS | Status: DC | PRN
  Administered 2024-02-25: 200 mg via INTRAVENOUS

## 2024-02-25 MED ORDER — LACTATED RINGERS IV SOLN: INTRAVENOUS | Status: DC

## 2024-02-25 MED ORDER — DEXAMETHASONE SODIUM PHOSPHATE 10 MG/ML IJ SOLN
INTRAMUSCULAR | Status: DC | PRN
  Administered 2024-02-25: 4 mg via INTRAVENOUS

## 2024-02-25 MED ORDER — PROPOFOL 10 MG/ML IV BOLUS
INTRAVENOUS | Status: DC | PRN
  Administered 2024-02-25: 50 mg via INTRAVENOUS
  Administered 2024-02-25: 200 mg via INTRAVENOUS

## 2024-02-25 NOTE — Anesthesia Postprocedure Evaluation (Signed)
 Anesthesia Post Note  Patient: [name]  Procedure(s) Performed: BREAST BILATERAL MRI WITH AND WITHOUT CONTRASTWITH ANESTHESIA (Bilateral)     Patient location during evaluation: PACU Anesthesia Type: General Level of consciousness: awake and alert Pain management: pain level controlled Vital Signs Assessment: post-procedure vital signs reviewed and stable Respiratory status: spontaneous breathing, nonlabored ventilation, respiratory function stable and patient connected to nasal cannula oxygen Cardiovascular status: blood pressure returned to baseline and stable Postop Assessment: no apparent nausea or vomiting Anesthetic complications: no  No notable events documented.  Last Vitals:  Vitals:   02/25/24 1515 02/25/24 1530  BP: (!) 81/53 (!) 86/53  Pulse: (!) 56 (!) 58  Resp: 13 10  Temp:  36.7 C  SpO2: 100% 100%    Last Pain:  Vitals:   02/25/24 1515  TempSrc:   PainSc: 0-No pain                 Pretty Weltman L Garner Dullea

## 2024-02-25 NOTE — Progress Notes (Addendum)
 CRITICAL VALUE STICKER  CRITICAL VALUE: Potassium 2.3  RECEIVER (on-site recipient of call): Lacie Draft, RN  DATE & TIME NOTIFIED: 02/25/2024 10:20  MESSENGER (representative from lab): Wille Glaser  MD NOTIFIED: Roxan Hockey, MD  TIME OF NOTIFICATION: 10:23 AM 02/25/2024   RESPONSE: New order for potassium IVPB per Dr Armond Hang

## 2024-02-25 NOTE — Anesthesia Procedure Notes (Addendum)
 Procedure Name: LMA Insertion Date/Time: 02/25/2024 1:29 PM  Performed by: Camillia Herter, CRNAPre-anesthesia Checklist: Patient identified, Emergency Drugs available, Suction available and Patient being monitored Patient Re-evaluated:Patient Re-evaluated prior to induction Oxygen Delivery Method: Ambu bag Preoxygenation: Pre-oxygenation with 100% oxygen Induction Type: IV induction Laryngoscope Size: Miller and 3 Grade View: Grade I Tube type: Oral Tube size: 7.5 mm Number of attempts: 1 Airway Equipment and Method: Bite block Placement Confirmation: positive ETCO2 Secured at: 21 cm Tube secured with: Tape Dental Injury: Teeth and Oropharynx as per pre-operative assessment

## 2024-02-25 NOTE — Transfer of Care (Signed)
 Immediate Anesthesia Transfer of Care Note  Patient: [name]  Procedure(s) Performed: BREAST BILATERAL MRI WITH AND WITHOUT CONTRASTWITH ANESTHESIA (Bilateral)  Patient Location: PACU  Anesthesia Type:General  Level of Consciousness: awake, alert , and oriented  Airway & Oxygen Therapy: Patient Spontanous Breathing  Post-op Assessment: Report given to RN and Post -op Vital signs reviewed and stable  Post vital signs: Reviewed and stable  Last Vitals:  Vitals Value Taken Time  BP 88/63 02/25/24 1445  Temp 36.6 C 02/25/24 1445  Pulse 54 02/25/24 1457  Resp 14 02/25/24 1457  SpO2 100 % 02/25/24 1457  Vitals shown include unfiled device data.  Last Pain:  Vitals:   02/25/24 1445  TempSrc:   PainSc: 0-No pain      Patients Stated Pain Goal: 0 (02/25/24 0920)  Complications: No notable events documented.

## 2024-02-26 ENCOUNTER — Ambulatory Visit (INDEPENDENT_AMBULATORY_CARE_PROVIDER_SITE_OTHER): Payer: BC Managed Care – PPO

## 2024-02-26 ENCOUNTER — Ambulatory Visit: Payer: BC Managed Care – PPO | Admitting: Neurology

## 2024-02-26 ENCOUNTER — Encounter (HOSPITAL_COMMUNITY): Payer: Self-pay | Admitting: Radiology

## 2024-02-26 DIAGNOSIS — R55 Syncope and collapse: Secondary | ICD-10-CM

## 2024-02-29 DIAGNOSIS — F410 Panic disorder [episodic paroxysmal anxiety] without agoraphobia: Secondary | ICD-10-CM | POA: Diagnosis not present

## 2024-02-29 DIAGNOSIS — F4312 Post-traumatic stress disorder, chronic: Secondary | ICD-10-CM | POA: Diagnosis not present

## 2024-02-29 LAB — CUP PACEART REMOTE DEVICE CHECK
Date Time Interrogation Session: 20250314063524
Implantable Pulse Generator Implant Date: 20231117

## 2024-03-01 ENCOUNTER — Other Ambulatory Visit: Payer: Self-pay | Admitting: General Surgery

## 2024-03-01 ENCOUNTER — Telehealth: Payer: Self-pay

## 2024-03-01 DIAGNOSIS — R2231 Localized swelling, mass and lump, right upper limb: Secondary | ICD-10-CM

## 2024-03-01 DIAGNOSIS — Z1509 Genetic susceptibility to other malignant neoplasm: Secondary | ICD-10-CM

## 2024-03-01 NOTE — Telephone Encounter (Signed)
 Alert received from [employer] for 1 Symptom episode on 02/26/24 at 18:48 for symptoms of heart fluttering and chest pain, EGM shows possible intermittent AFL with notched T waves; routed to clinic for symptom of chest pain.   Will call patient to follow up on symptoms. Patient has recall in with Dr. Anne Fu.

## 2024-03-01 NOTE — Telephone Encounter (Signed)
 Patient reports she was laying down reading when chest pain/shortness of breath began, duration was several minutes.   Do note K+ was 2.3 while at hospital for MRI on 02/25/24. (patient did receive IV K+ per pt). Pt has sent message to f/u with PCP on potassium.  Patient advised of ER precautions. Pt voiced understanding.  Pt is ovedue with Dr. Caro Hight and needs apt. Routing to [address] Scheduling.

## 2024-03-02 ENCOUNTER — Encounter: Payer: Self-pay | Admitting: Internal Medicine

## 2024-03-02 ENCOUNTER — Ambulatory Visit: Payer: BC Managed Care – PPO | Admitting: Neurology

## 2024-03-02 ENCOUNTER — Encounter: Payer: Self-pay | Admitting: Neurology

## 2024-03-02 DIAGNOSIS — Z029 Encounter for administrative examinations, unspecified: Secondary | ICD-10-CM

## 2024-03-02 NOTE — Telephone Encounter (Signed)
 Hypokalemia treatment should be directed to the primary MD.

## 2024-03-03 DIAGNOSIS — F4312 Post-traumatic stress disorder, chronic: Secondary | ICD-10-CM | POA: Diagnosis not present

## 2024-03-03 DIAGNOSIS — F840 Autistic disorder: Secondary | ICD-10-CM | POA: Diagnosis not present

## 2024-03-07 ENCOUNTER — Other Ambulatory Visit

## 2024-03-07 DIAGNOSIS — F840 Autistic disorder: Secondary | ICD-10-CM | POA: Diagnosis not present

## 2024-03-07 DIAGNOSIS — F4312 Post-traumatic stress disorder, chronic: Secondary | ICD-10-CM | POA: Diagnosis not present

## 2024-03-07 DIAGNOSIS — F50022 Anorexia nervosa, binge eating/purging type, severe: Secondary | ICD-10-CM | POA: Diagnosis not present

## 2024-03-10 ENCOUNTER — Telehealth: Payer: Self-pay | Admitting: Gastroenterology

## 2024-03-10 ENCOUNTER — Encounter: Payer: Self-pay | Admitting: Gastroenterology

## 2024-03-10 DIAGNOSIS — F4312 Post-traumatic stress disorder, chronic: Secondary | ICD-10-CM | POA: Diagnosis not present

## 2024-03-10 DIAGNOSIS — F840 Autistic disorder: Secondary | ICD-10-CM | POA: Diagnosis not present

## 2024-03-10 DIAGNOSIS — F50022 Anorexia nervosa, binge eating/purging type, severe: Secondary | ICD-10-CM | POA: Diagnosis not present

## 2024-03-10 NOTE — Telephone Encounter (Signed)
 She can be seen for a new patient office visit, routine.

## 2024-03-10 NOTE — Telephone Encounter (Signed)
 Dr. Adela Lank,  Patient was established with Dr. Christain [place] and transferred care to [facility]. (Only saw them once and records are in [facility]).  She is requesting to switch back to [facility] due to insurance and it is closer.  Please advise scheduling?  Thanks  Dr. Adela Lank DOD 03/10/24

## 2024-03-11 ENCOUNTER — Other Ambulatory Visit

## 2024-03-14 DIAGNOSIS — F4312 Post-traumatic stress disorder, chronic: Secondary | ICD-10-CM | POA: Diagnosis not present

## 2024-03-14 DIAGNOSIS — F840 Autistic disorder: Secondary | ICD-10-CM | POA: Diagnosis not present

## 2024-03-18 ENCOUNTER — Ambulatory Visit: Admitting: Neurology

## 2024-03-18 DIAGNOSIS — F4312 Post-traumatic stress disorder, chronic: Secondary | ICD-10-CM | POA: Diagnosis not present

## 2024-03-18 DIAGNOSIS — F840 Autistic disorder: Secondary | ICD-10-CM | POA: Diagnosis not present

## 2024-03-21 DIAGNOSIS — F4312 Post-traumatic stress disorder, chronic: Secondary | ICD-10-CM | POA: Diagnosis not present

## 2024-03-21 DIAGNOSIS — F840 Autistic disorder: Secondary | ICD-10-CM | POA: Diagnosis not present

## 2024-03-22 ENCOUNTER — Ambulatory Visit: Admitting: Adult Health

## 2024-03-24 ENCOUNTER — Ambulatory Visit: Admitting: Adult Health

## 2024-03-24 ENCOUNTER — Other Ambulatory Visit

## 2024-03-24 ENCOUNTER — Encounter: Payer: Self-pay | Admitting: Adult Health
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
## 2024-03-25 ENCOUNTER — Other Ambulatory Visit: Payer: Self-pay

## 2024-03-25 ENCOUNTER — Emergency Department (HOSPITAL_BASED_OUTPATIENT_CLINIC_OR_DEPARTMENT_OTHER)
Admission: EM | Admit: 2024-03-25 | Discharge: 2024-03-25 | Disposition: A | Discharge status: Home / Self Care | Attending: Emergency Medicine | Admitting: Emergency Medicine

## 2024-03-25 ENCOUNTER — Encounter (HOSPITAL_BASED_OUTPATIENT_CLINIC_OR_DEPARTMENT_OTHER): Payer: Self-pay | Admitting: Emergency Medicine

## 2024-03-25 ENCOUNTER — Emergency Department (HOSPITAL_BASED_OUTPATIENT_CLINIC_OR_DEPARTMENT_OTHER)

## 2024-03-25 DIAGNOSIS — E876 Hypokalemia: Secondary | ICD-10-CM | POA: Diagnosis not present

## 2024-03-25 DIAGNOSIS — R9431 Abnormal electrocardiogram [ECG] [EKG]: Secondary | ICD-10-CM | POA: Diagnosis not present

## 2024-03-25 DIAGNOSIS — N200 Calculus of kidney: Secondary | ICD-10-CM | POA: Insufficient documentation

## 2024-03-25 DIAGNOSIS — N12 Tubulo-interstitial nephritis, not specified as acute or chronic: Secondary | ICD-10-CM | POA: Diagnosis not present

## 2024-03-25 DIAGNOSIS — N179 Acute kidney failure, unspecified: Secondary | ICD-10-CM | POA: Insufficient documentation

## 2024-03-25 DIAGNOSIS — E871 Hypo-osmolality and hyponatremia: Secondary | ICD-10-CM | POA: Diagnosis not present

## 2024-03-25 DIAGNOSIS — R109 Unspecified abdominal pain: Secondary | ICD-10-CM | POA: Diagnosis not present

## 2024-03-25 DIAGNOSIS — R319 Hematuria, unspecified: Secondary | ICD-10-CM | POA: Diagnosis not present

## 2024-03-25 DIAGNOSIS — F4312 Post-traumatic stress disorder, chronic: Secondary | ICD-10-CM | POA: Diagnosis not present

## 2024-03-25 DIAGNOSIS — F840 Autistic disorder: Secondary | ICD-10-CM | POA: Diagnosis not present

## 2024-03-25 DIAGNOSIS — Z9049 Acquired absence of other specified parts of digestive tract: Secondary | ICD-10-CM | POA: Diagnosis not present

## 2024-03-25 DIAGNOSIS — Z853 Personal history of malignant neoplasm of breast: Secondary | ICD-10-CM | POA: Diagnosis not present

## 2024-03-25 LAB — COMPREHENSIVE METABOLIC PANEL WITH GFR
ALT: 15 U/L (ref 0–44)
AST: 19 U/L (ref 15–41)
Albumin: 4.3 g/dL (ref 3.5–5.0)
Alkaline Phosphatase: 42 U/L (ref 38–126)
Anion gap: 11 (ref 5–15)
BUN: 24 mg/dL — ABNORMAL HIGH (ref 6–20)
CO2: 25 mmol/L (ref 22–32)
Calcium: 8.6 mg/dL — ABNORMAL LOW (ref 8.9–10.3)
Chloride: 98 mmol/L (ref 98–111)
Creatinine, Ser: 1.16 mg/dL — ABNORMAL HIGH (ref 0.44–1.00)
GFR, Estimated: >60 mL/min (ref >60)
Glucose, Bld: 76 mg/dL (ref 70–99)
Potassium: 2.8 mmol/L — ABNORMAL LOW (ref 3.5–5.1)
Sodium: 134 mmol/L — ABNORMAL LOW (ref 135–145)
Total Bilirubin: 0.3 mg/dL (ref 0.0–1.2)
Total Protein: 6.1 g/dL — ABNORMAL LOW (ref 6.5–8.1)

## 2024-03-25 LAB — URINALYSIS, ROUTINE W REFLEX MICROSCOPIC
Bilirubin Urine: NEGATIVE
Glucose, UA: NEGATIVE mg/dL
Ketones, ur: NEGATIVE mg/dL
Nitrite: NEGATIVE
Protein, ur: 100 mg/dL — AB
Specific Gravity, Urine: 1.019 (ref 1.005–1.030)
pH: 5.5 (ref 5.0–8.0)

## 2024-03-25 LAB — LIPASE, BLOOD: Lipase: 22 U/L (ref 11–51)

## 2024-03-25 LAB — MAGNESIUM: Magnesium: 2.2 mg/dL (ref 1.7–2.4)

## 2024-03-25 LAB — CBC
HCT: 33.5 % — ABNORMAL LOW (ref 36.0–46.0)
Hemoglobin: 12.2 g/dL (ref 12.0–15.0)
MCH: 30 pg (ref 26.0–34.0)
MCHC: 36.4 g/dL — ABNORMAL HIGH (ref 30.0–36.0)
MCV: 82.5 fL (ref 80.0–100.0)
Platelets: 375 10*3/uL (ref 150–400)
RBC: 4.06 MIL/uL (ref 3.87–5.11)
RDW: 12.3 % (ref 11.5–15.5)
WBC: 7 10*3/uL (ref 4.0–10.5)
nRBC: 0 % (ref 0.0–0.2)

## 2024-03-25 LAB — PREGNANCY, URINE: Preg Test, Ur: NEGATIVE

## 2024-03-25 MED ORDER — FOSFOMYCIN TROMETHAMINE 3 G PO PACK
3.0000 g | PACK | Freq: Once | ORAL | Status: AC
  Administered 2024-03-25: 3 g via ORAL
  Filled 2024-03-25: qty 3

## 2024-03-25 MED ORDER — LACTATED RINGERS IV BOLUS
1000.0000 mL | Freq: Once | INTRAVENOUS | Status: AC
  Administered 2024-03-25: 1000 mL via INTRAVENOUS

## 2024-03-25 MED ORDER — OXYCODONE HCL 5 MG PO TABS: 5.0000 mg | ORAL_TABLET | ORAL | 0 refills | Status: AC | PRN

## 2024-03-25 MED ORDER — POTASSIUM CHLORIDE ER 10 MEQ PO TBCR
10.0000 meq | EXTENDED_RELEASE_TABLET | Freq: Every day | ORAL | 0 refills | Status: DC
Start: 2024-03-25 — End: 2024-10-26

## 2024-03-25 MED ORDER — PHENAZOPYRIDINE HCL 100 MG PO TABS
200.0000 mg | ORAL_TABLET | Freq: Once | ORAL | Status: AC
  Administered 2024-03-25: 200 mg via ORAL
  Filled 2024-03-25: qty 2

## 2024-03-25 MED ORDER — OXYCODONE-ACETAMINOPHEN 5-325 MG PO TABS
1.0000 | ORAL_TABLET | Freq: Once | ORAL | Status: AC
  Administered 2024-03-25: 1 via ORAL
  Filled 2024-03-25: qty 1

## 2024-03-25 MED ORDER — POTASSIUM CHLORIDE 20 MEQ PO PACK
40.0000 meq | PACK | Freq: Once | ORAL | Status: DC
  Filled 2024-03-25: qty 2

## 2024-03-25 MED ORDER — FOSFOMYCIN TROMETHAMINE 3 G PO PACK: 3.0000 g | PACK | ORAL | 0 refills | Status: AC

## 2024-03-25 MED ORDER — FENTANYL CITRATE PF 50 MCG/ML IJ SOSY
50.0000 ug | PREFILLED_SYRINGE | Freq: Once | INTRAMUSCULAR | Status: AC
  Administered 2024-03-25: 50 ug via INTRAVENOUS
  Filled 2024-03-25: qty 1

## 2024-03-25 MED ORDER — IBUPROFEN 800 MG PO TABS: 800.0000 mg | ORAL_TABLET | Freq: Three times a day (TID) | ORAL | 0 refills | Status: AC | PRN

## 2024-03-25 MED ORDER — DIPHENHYDRAMINE HCL 50 MG/ML IJ SOLN
25.0000 mg | Freq: Once | INTRAMUSCULAR | Status: AC
  Administered 2024-03-25: 25 mg via INTRAVENOUS
  Filled 2024-03-25: qty 1

## 2024-03-25 MED ORDER — DICYCLOMINE HCL 10 MG/ML IM SOLN: 20.0000 mg | Freq: Once | INTRAMUSCULAR | Status: DC

## 2024-03-25 MED ORDER — TAMSULOSIN HCL 0.4 MG PO CAPS: 0.4000 mg | ORAL_CAPSULE | Freq: Every day | ORAL | 0 refills | Status: DC

## 2024-03-25 MED ORDER — KETOROLAC TROMETHAMINE 15 MG/ML IJ SOLN
15.0000 mg | Freq: Once | INTRAMUSCULAR | Status: AC
  Administered 2024-03-25: 15 mg via INTRAVENOUS
  Filled 2024-03-25: qty 1

## 2024-03-25 MED ORDER — POTASSIUM CHLORIDE 10 MEQ/100ML IV SOLN
10.0000 meq | Freq: Once | INTRAVENOUS | Status: AC
  Administered 2024-03-25: 10 meq via INTRAVENOUS
  Filled 2024-03-25: qty 100

## 2024-03-25 MED ORDER — SODIUM CHLORIDE 0.9 % IV BOLUS
1000.0000 mL | Freq: Once | INTRAVENOUS | Status: AC
  Administered 2024-03-25: 1000 mL via INTRAVENOUS

## 2024-03-25 NOTE — ED Notes (Signed)
 Reviewed discharge instructions, medications, and home care with pt. Pt verbalized understanding and had no further questions. Pt exited ED via wheelchair with mom and without complications.

## 2024-03-25 NOTE — Discharge Instructions (Addendum)
 You were seen in the emergency department and found to have a 5mm kidney stone on the right side.  I am attaching your CT scan results below for your reference.  We are sending you home with multiple medications to assist with passing the stone:   -Flomax-this is a medication to help pass the stone, it allows urine to exit the body more freely.  Please take this once daily with a meal.  -Ibuprofen 800 mg-this is a medication that will help with pain as well as passing the stone.  Please take this every 8 hours.  Take this with food as it can cause stomach upset. Do not take other NSAIDs such as Motrin, Aleve, Advil, Mobic, or Naproxen with this medicine as they are similar and would propagate any potential side effects such as stomach upset, stomach ulcers or GI bleeding.  You were given a similar medication here today called Toradol, your next dose of ibuprofen at home can be no sooner than 1:30 AM.  -Oxycodone- This is a narcotic/controlled substance medication that has potential addicting qualities.  You may take 1 tablet every 4-6 hours as needed for severe pain no controlled with ibuprofen.  Do not drive or operate heavy machinery when taking this medicine as it can be sedating. Do not drink alcohol or take other sedating medications when taking this medicine for safety reasons.  Keep this out of reach of small children.  You are given a dose here before leaving, your next dose can be no sooner than 3:30 AM  -Fosfomycin: You were given your first dose here today for treatment of your urinary tract infection.  Please take the next dose of your medication on 03/27/2024, then again on 03/29/2024.    Your urine has been sent off for culture.  You will be contacted if your antibiotic needs to be changed.  Your potassium was low here today.  You were given a potassium supplement today.  I have also prescribed some potassium supplements for you to continue taking at home.  Please follow-up with your PCP  within the next 2 weeks for repeat potassium and to discuss if you need further potassium supplementation.  Please follow-up with the urology group provided in your discharge instructions within 3 to 5 days. You must call to schedule an appointment. Please bring the kidney stone with you to your urology appointment if you are able to catch it.  Return to the ER for new or worsening symptoms including but not limited to worsening pain not controlled by these medicines, inability to keep fluids down, fever, or any other concerns that you may have.   "EXAM: CT ABDOMEN AND PELVIS WITHOUT CONTRAST   TECHNIQUE: Multidetector CT imaging of the abdomen and pelvis was performed following the standard protocol without IV contrast.   RADIATION DOSE REDUCTION: This exam was performed according to the departmental dose-optimization program which includes automated exposure control, adjustment of the mA and/or kV according to patient size and/or use of iterative reconstruction technique.   COMPARISON:  03/21/2021   FINDINGS: Lower chest: No acute abnormality   Hepatobiliary: No focal liver abnormality is seen. Status post cholecystectomy. No biliary dilatation.   Pancreas: No focal abnormality or ductal dilatation.   Spleen: No focal abnormality.  Normal size.   Adrenals/Urinary Tract: 5 mm right renal pelvic stone. No hydronephrosis bilaterally. No renal or adrenal mass. Urinary bladder decompressed.   Stomach/Bowel: Stomach, large and small bowel grossly unremarkable.   Vascular/Lymphatic: No evidence of aneurysm  or adenopathy.   Reproductive: Prior hysterectomy.  No adnexal masses.   Other: No free fluid or free air.   Musculoskeletal: No acute bony abnormality.   IMPRESSION: 5 mm right renal pelvic stone.  No hydronephrosis.     Electronically Signed   By: Charlett Nose M.D.   On: 03/25/2024 23:06"

## 2024-03-25 NOTE — ED Notes (Signed)
 Pt ambulated to restroom.

## 2024-03-25 NOTE — ED Triage Notes (Signed)
 UTI  Vomiting after abt Blood now in urine and abdo pain Symptoms started 2 weeks ago

## 2024-03-25 NOTE — ED Notes (Signed)
 Called lab to add mag and urine culture, spoke with Fayrene Fearing.

## 2024-03-25 NOTE — ED Provider Notes (Signed)
 [facility] EMERGENCY DEPARTMENT AT [facility] Provider Note   CSN: [id number] Arrival date & time: 03/25/24  1252     History  Chief Complaint  Patient presents with   Abdominal Pain    [name] is a 33 y.o. female with history of seizures, breast cancer status post mastectomy, prolonged QT, presents with concern for dysuria and hematuria ongoing for the past 2 weeks.  States she was treated for possible UTI by her PCP with nitrofurantoin initially.  This did not help to resolve her symptoms and so she was prescribed Bactrim which also did not help.  Yesterday, she started to develop lower back pain and also reports a fever at home of 101.  She threw up yesterday after taking her antibiotic, but no further episodes of emesis.  She reports normal bowel movements without diarrhea.    Abdominal Pain      Home Medications Prior to Admission medications   Medication Sig Start Date End Date Taking? Authorizing Provider  fosfomycin (MONUROL) 3 g PACK Take 3 g by mouth every other day for 2 doses. 03/27/24 03/30/24 Yes Rexie Catena, PA-C  ibuprofen (ADVIL) 800 MG tablet Take 1 tablet (800 mg total) by mouth every 8 (eight) hours as needed. 03/25/24  Yes Rexie Catena, PA-C  oxyCODONE (ROXICODONE) 5 MG immediate release tablet Take 1 tablet (5 mg total) by mouth every 4 (four) hours as needed for up to 3 days for severe pain (pain score 7-10). 03/25/24 03/28/24 Yes Rexie Catena, PA-C  potassium chloride (KLOR-CON) 10 MEQ tablet Take 1 tablet (10 mEq total) by mouth daily for 7 days. 03/25/24 04/01/24 Yes Rexie Catena, PA-C  tamsulosin (FLOMAX) 0.4 MG CAPS capsule Take 1 capsule (0.4 mg total) by mouth daily after breakfast. 03/25/24  Yes Rexie Catena, PA-C  acetaminophen (TYLENOL) 325 MG tablet Take 2 tablets (650 mg total) by mouth every 6 (six) hours as needed (pain). 12/10/21   Lujean Sake, MD  acetaminophen-codeine (TYLENOL #3) 300-30 MG tablet Take 1-2  tablets by mouth every 6 (six) hours as needed for moderate pain (pain score 4-6) or severe pain (pain score 7-10) (post op pain). Patient not taking: Reported on 02/24/2024 10/27/23   Nida, Andrew, PA-C  albuterol (VENTOLIN HFA) 108 (90 Base) MCG/ACT inhaler Inhale 2 puffs into the lungs every 6 (six) hours as needed for wheezing or shortness of breath. 09/19/21   Nafziger, Randel Buss, NP  buPROPion (WELLBUTRIN XL) 300 MG 24 hr tablet Take 300 mg by mouth 2 (two) times daily. 03/31/22   [provider]  busPIRone (BUSPAR) 15 MG tablet Take 15 mg by mouth 2 (two) times daily. 04/29/22   [provider]  Continuous Blood Gluc Sensor (FREESTYLE LIBRE 2 SENSOR) MISC USE WITH LIBRE APP 08/19/22   Nafziger, Randel Buss, NP  Continuous Blood Gluc Transmit (DEXCOM G6 TRANSMITTER) MISC Use with dexcom receiver and sensor 07/03/21   Nafziger, Randel Buss, NP  fluticasone furoate-vilanterol (BREO ELLIPTA) 100-25 MCG/ACT AEPB Inhale 1 puff into the lungs daily. 02/03/23   Nafziger, Randel Buss, NP  gabapentin (NEURONTIN) 100 MG capsule Take 100 mg by mouth at bedtime. Patient not taking: Reported on 02/24/2024    [provider]  Glucagon, rDNA, (GLUCAGON EMERGENCY) 1 MG KIT Use PRN for hypoglycemia 10/09/20   Alto Atta, NP  glucose monitoring kit (FREESTYLE) monitoring kit 1 each by Does not apply route as needed for other. Free style libre on right arm    [provider]  Lacosamide (VIMPAT) 100  MG TABS Take 1 tablet in AM, 2 tablets in PM Patient taking differently: Take 3 tablets by mouth See admin instructions. Take 1 tablet in AM, 2 tablets in PM 06/26/23   Jhonny Moss, MD  levETIRAcetam (KEPPRA) 500 MG tablet Take 500 mg by mouth 2 (two) times daily. Patient not taking: Reported on 02/25/2024 09/12/23   [provider]  LORazepam (ATIVAN) 0.5 MG tablet TAKE 1 TABLET AS NEEDED FOR SEIZURE. DO NOT TAKE MORE THAN 2-3 A WEEK. 02/19/24   Jhonny Moss, MD  montelukast (SINGULAIR) 10 MG tablet  TAKE 1 TABLET BY MOUTH EVERYDAY AT BEDTIME Patient taking differently: Take 10 mg by mouth at bedtime. 09/15/23   Nafziger, Randel Buss, NP  MOTEGRITY 2 MG TABS TAKE 1 TABLET BY MOUTH EVERY DAY Patient taking differently: Take 2 mg by mouth daily. 09/19/22   Lindle Rhea, MD  PARoxetine (PAXIL) 10 MG tablet Take 10 mg by mouth at bedtime. 12/10/23   [provider]  perampanel (FYCOMPA) 8 MG tablet Take 1 tablet (8 mg total) by mouth at bedtime. Take 1 tablet every night 06/26/23   Jhonny Moss, MD  topiramate (TOPAMAX) 100 MG tablet Take 1 and 1/2 tablets (150mg ) twice a day 11/26/23   Jhonny Moss, MD      Allergies    Cefprozil, Cephalosporins, Latex, Penicillins, Ciprofloxacin, Shellfish allergy, Grass pollen([id number]-o-r-t-swt vern), Morphine and codeine, Iodine, Tegaderm ag mesh [silver], and Vancomycin    Review of Systems   Review of Systems  Gastrointestinal:  Positive for abdominal pain.    Physical Exam Updated Vital Signs BP 110/82   Pulse 79   Temp 98.2 F (36.8 C)   Resp 15   LMP 02/11/2021   SpO2 100%  Physical Exam Vitals and nursing note reviewed.  Constitutional:      General: She is not in acute distress.    Appearance: She is well-developed.  HENT:     Head: Normocephalic and atraumatic.  Eyes:     Conjunctiva/sclera: Conjunctivae normal.  Cardiovascular:     Rate and Rhythm: Normal rate and regular rhythm.     Heart sounds: No murmur heard. Pulmonary:     Effort: Pulmonary effort is normal. No respiratory distress.     Breath sounds: Normal breath sounds.  Abdominal:     Palpations: Abdomen is soft.     Tenderness: There is no abdominal tenderness.     Comments: Mild tenderness to the suprapubic region. No rebound or guarding   Musculoskeletal:        General: No swelling.     Cervical back: Neck supple.  Skin:    General: Skin is warm and dry.     Capillary Refill: Capillary refill takes less than 2 seconds.  Neurological:     Mental  Status: She is alert.  Psychiatric:        Mood and Affect: Mood normal.     ED Results / Procedures / Treatments   Labs (all labs ordered are listed, but only abnormal results are displayed) Labs Reviewed  COMPREHENSIVE METABOLIC PANEL WITH GFR - Abnormal; Notable for the following components:      Result Value   Sodium 134 (*)    Potassium 2.8 (*)    BUN 24 (*)    Creatinine, Ser 1.16 (*)    Calcium 8.6 (*)    Total Protein 6.1 (*)    All other components within normal limits  CBC - Abnormal; Notable for the following components:  HCT 33.5 (*)    MCHC 36.4 (*)    All other components within normal limits  URINALYSIS, ROUTINE W REFLEX MICROSCOPIC - Abnormal; Notable for the following components:   Color, Urine BROWN (*)    APPearance CLOUDY (*)    Hgb urine dipstick LARGE (*)    Protein, ur 100 (*)    Leukocytes,Ua SMALL (*)    All other components within normal limits  URINE CULTURE  LIPASE, BLOOD  MAGNESIUM  PREGNANCY, URINE    EKG None  Radiology CT ABDOMEN PELVIS WO CONTRAST Result Date: 03/25/2024 CLINICAL DATA:  Flank pain, hematuria EXAM: CT ABDOMEN AND PELVIS WITHOUT CONTRAST TECHNIQUE: Multidetector CT imaging of the abdomen and pelvis was performed following the standard protocol without IV contrast. RADIATION DOSE REDUCTION: This exam was performed according to the departmental dose-optimization program which includes automated exposure control, adjustment of the mA and/or kV according to patient size and/or use of iterative reconstruction technique. COMPARISON:  03/21/2021 FINDINGS: Lower chest: No acute abnormality Hepatobiliary: No focal liver abnormality is seen. Status post cholecystectomy. No biliary dilatation. Pancreas: No focal abnormality or ductal dilatation. Spleen: No focal abnormality.  Normal size. Adrenals/Urinary Tract: 5 mm right renal pelvic stone. No hydronephrosis bilaterally. No renal or adrenal mass. Urinary bladder decompressed.  Stomach/Bowel: Stomach, large and small bowel grossly unremarkable. Vascular/Lymphatic: No evidence of aneurysm or adenopathy. Reproductive: Prior hysterectomy.  No adnexal masses. Other: No free fluid or free air. Musculoskeletal: No acute bony abnormality. IMPRESSION: 5 mm right renal pelvic stone.  No hydronephrosis. Electronically Signed   By: Janeece Mechanic M.D.   On: 03/25/2024 23:06    Procedures Procedures    Medications Ordered in ED Medications  potassium chloride (KLOR-CON) packet 40 mEq (40 mEq Oral Not Given 03/25/24 1724)  dicyclomine (BENTYL) injection 20 mg (20 mg Intramuscular Patient Refused/Not Given 03/25/24 1905)  fentaNYL (SUBLIMAZE) injection 50 mcg (has no administration in time range)  oxyCODONE-acetaminophen (PERCOCET/ROXICET) 5-325 MG per tablet 1 tablet (has no administration in time range)  lactated ringers bolus 1,000 mL (0 mLs Intravenous Stopped 03/25/24 2021)  potassium chloride 10 mEq in 100 mL IVPB (0 mEq Intravenous Stopped 03/25/24 1904)  phenazopyridine (PYRIDIUM) tablet 200 mg (200 mg Oral Given 03/25/24 1724)  ketorolac (TORADOL) 15 MG/ML injection 15 mg (15 mg Intravenous Given 03/25/24 1723)  fosfomycin (MONUROL) packet 3 g (3 g Oral Given 03/25/24 2058)  diphenhydrAMINE (BENADRYL) injection 25 mg (25 mg Intravenous Given 03/25/24 1818)  fentaNYL (SUBLIMAZE) injection 50 mcg (50 mcg Intravenous Given 03/25/24 1905)  sodium chloride 0.9 % bolus 1,000 mL (0 mLs Intravenous Stopped 03/25/24 2205)    ED Course/ Medical Decision Making/ A&P Clinical Course as of 03/25/24 2335  Fri Mar 25, 2024  2018 Upon reevaluation, patient states she threw up immediately after p.o. intake.  Reports pain better controlled currently [AF]  2217 Upon re-evaluation patient states she was able to keep the medicine and saltine crackers down.  Still awaiting CT scan results. [AF]    Clinical Course User Index [AF] Rexie Catena, PA-C                                 Medical  Decision Making Amount and/or Complexity of Data Reviewed Labs: ordered. Radiology: ordered.  Risk Prescription drug management.     Differential diagnosis includes but is not limited to Cholelithiasis, cholangitis, choledocholithiasis, peptic ulcer, gastritis, gastroenteritis, appendicitis, IBS, IBD, DKA, nephrolithiasis, UTI,  pyelonephritis, pancreatitis, diverticulitis, mesenteric ischemia, abdominal aortic aneurysm, small bowel obstruction, volvulus, testicular torsion in males, ovarian torsion and pregnancy related concerns in females of childbearing age    ED Course:  Upon initial evaluation, patient is well appearing, stable vitals. Abdomen is mildly tender in the suprapubic region but no rebound or guarding. No active vomiting.  Labs Ordered: I Ordered, and personally interpreted labs.  The pertinent results include:   CBC without leukocytosis CMP with hypokalemia at 2.8, hyponatremia at 134.  Elevated creatinine of 1.16.  Normal LFTs Lipase within normal limits Urinalysis with large amount of hemoglobin, protein, and small leukocytes.  No nitrites  Imaging Studies ordered: I ordered imaging studies including CT abdomen pelvis I independently visualized the imaging with scope of interpretation limited to determining acute life threatening conditions related to emergency care. Imaging showed 5 mm kidney stone in the right renal pelvis, no hydronephrosis I agree with the radiologist interpretation   Cardiac Monitoring: / EKG: The patient was maintained on a cardiac monitor.  I personally viewed and interpreted the cardiac monitored which showed an underlying rhythm of: EKG with QTc of 560  Medications Given: Pyridium, toradol, percocet, and fentanyl given for pain Fosfomycin for pyelonephritis 40 mEq Kcl for hypokalemia, 10 mEq KCL IV for hypokalemia 2L LR for AKI  Patient with dysuria, hematuria, and flank pain, and evidence of UTI on urinalysis. Suspect pyelonephritis.  She is tolerating PO intake, afebrile, and no leukocytosis. Had shared decision making conversation with patient and she would like to try outpatient therapy which I feel is reasonable. She already has been on a course of Bactrim and nitrofurantoin by her PCP.  She does have extensive allergies including anaphylactic reactions to penicillins, cephalosporins, and allergies to ciprofloxacin and vancomycin.  Will try patient on course of fosfomycin.  Will send urine off for culture.  She does have history of prolonged QT and does have prolonged QT on her EKG today.  Will avoid QT prolonging medications.    Upon re-evaluation, patient tried to swallow the potassium supplements ordered.  She reported an episode of vomiting after this.  Also reporting increased pain even after Prodium and Toradol.  Added CT to rule out other etiology.  Will hold on trying her fosfomycin at this time.  I have added Benadryl to help with nausea, added fentanyl for pain.  Patient declines Bentyl for pain  Upon reevaluation, patient states she is feeling much better with the pain medications.  She has been tolerating saltine crackers and was able to keep down her fosfomycin for an hour without difficulty.  Discussed that she has a 5 mm kidney stone on her CT.  Since patient's pain is well-controlled here, no signs of systemic infection, stable vitals, will treat outpatient with close urology follow-up.    Impression: Nephrolithiasis Pyelonephritis AKI Hypokalemia  Disposition:  The patient was discharged home with instructions to take ibuprofen every 8 hours as needed for pain at home.  May take prescribed oxycodone for breakthrough pain for her kidney stone.  Flomax for kidney stone.  Strain urine and bring stone to follow-up appointment with urology.  She understands she needs to contact urology to make a follow-up appointment.  Their contact information was provided.  Continue on fosfomycin as prescribed for treatment of  UTI/possible pyelonephritis.  She is prescribed potassium supplements for her hypokalemia.  Follow-up with PCP within the next 2 weeks for recheck of potassium. Return precautions given.    This chart was dictated using voice  recognition software, Nurse, children's. Despite the best efforts of this provider to proofread and correct errors, errors may still occur which can change documentation meaning.          Final Clinical Impression(s) / ED Diagnoses Final diagnoses:  Kidney stone on right side  Pyelonephritis  Hypokalemia  AKI (acute kidney injury) (HCC)    Rx / DC Orders ED Discharge Orders          Ordered    fosfomycin (MONUROL) 3 g PACK  Every 48 hours        03/25/24 2321    tamsulosin (FLOMAX) 0.4 MG CAPS capsule  Daily after breakfast        03/25/24 2321    oxyCODONE (ROXICODONE) 5 MG immediate release tablet  Every 4 hours PRN        03/25/24 2321    potassium chloride (KLOR-CON) 10 MEQ tablet  Daily        03/25/24 2326    ibuprofen (ADVIL) 800 MG tablet  Every 8 hours PRN        03/25/24 2327              Rexie Catena, PA-C 03/25/24 2336    Mozell Arias, MD 03/26/24 1441

## 2024-03-25 NOTE — ED Notes (Signed)
 ED Provider at bedside.

## 2024-03-26 LAB — URINE CULTURE: Culture: NO GROWTH

## 2024-03-29 ENCOUNTER — Ambulatory Visit: Attending: Cardiology | Admitting: Cardiology

## 2024-03-29 ENCOUNTER — Encounter: Payer: Self-pay | Admitting: Cardiology

## 2024-03-29 VITALS — BP 118/80 | HR 78 | Ht 64.0 in | Wt 96.0 lb

## 2024-03-29 DIAGNOSIS — R001 Bradycardia, unspecified: Secondary | ICD-10-CM

## 2024-03-29 DIAGNOSIS — R9431 Abnormal electrocardiogram [ECG] [EKG]: Secondary | ICD-10-CM

## 2024-03-29 DIAGNOSIS — R55 Syncope and collapse: Secondary | ICD-10-CM

## 2024-03-29 NOTE — Patient Instructions (Signed)
 Medication Instructions:  The current medical regimen is effective;  continue present plan and medications.  *If you need a refill on your cardiac medications before your next appointment, please call your pharmacy*  Follow-Up: At [facility], you and your health needs are our priority.  As part of our continuing mission to provide you with exceptional heart care, our providers are all part of one team.  This team includes your primary Cardiologist (physician) and Advanced Practice Providers or APPs (Physician Assistants and Nurse Practitioners) who all work together to provide you with the care you need, when you need it.  Your next appointment:   1 year(s)  Provider:   Dr Donato Schultz   We recommend signing up for the patient portal called "MyChart".  Sign up information is provided on this After Visit Summary.  MyChart is used to connect with patients for Virtual Visits (Telemedicine).  Patients are able to view lab/test results, encounter notes, upcoming appointments, etc.  Non-urgent messages can be sent to your provider as well.   To learn more about what you can do with MyChart, go to [email or web address].        1st Floor: - Lobby - Registration  - Pharmacy  - Lab - Cafe  2nd Floor: - PV Lab - Diagnostic Testing (echo, CT, nuclear med)  3rd Floor: - Vacant  4th Floor: - TCTS (cardiothoracic surgery) - AFib Clinic - Structural Heart Clinic - Vascular Surgery  - Vascular Ultrasound  5th Floor: - HeartCare Cardiology (general and EP) - Clinical Pharmacy for coumadin, hypertension, lipid, weight-loss medications, and med management appointments    Valet parking services will be available as well.

## 2024-03-29 NOTE — Progress Notes (Signed)
 Cardiology Office Note:  .   Date:  03/29/2024  ID:  [name], DOB 1991/04/23, MRN [id number] PCP: Alto Atta, NP  [facility] Cardiologist:  Dorothye Gathers, MD     History of Present Illness: .   [name] is a 33 y.o. female Discussed the use of AI scribe software for clinical note transcription with the patient, who gave verbal consent to proceed.  History of Present Illness [name] "Kit" is a 33 year old female with syncope who presents for follow-up of syncope.  She has a history of syncope and altered consciousness, with two prior syncopal episodes characterized by sudden shortness of breath, palpitations, and loss of consciousness. She underwent an implantable loop recorder insertion on October 31, 2022, and the most recent device interrogation on February 29, 2024, showed no adverse arrhythmias.  Recently, she experienced an episode of chest tightness that led her to the ER, where an EKG showed long QT. She did not experience syncope during this episode. She has a history of borderline elevated QT intervals intermittently. She was previously intolerant to nadolol due to symptomatic bradycardia and dizziness. She experiences resting bradycardia, possibly due to high vagal tone from running miles daily.  Her family history is significant for sudden death; her father died while running with her.  She has been taking potassium tablets following a recent ER visit where her potassium levels were found to be low, possibly related to a kidney stone. Her potassium levels were normal in March of the previous year but have since been slightly low.  She has a history of congenital deafness.     Studies Reviewed: .        Results LABS Potassium: 4.2 (02/2023)  DIAGNOSTIC Implantable loop recorder interrogation: No adverse arrhythmias (02/29/2024) EKG: Long QT Echocardiogram: Normal pump function (2019) Risk Assessment/Calculations:            Physical  Exam:   VS:  BP 118/80   Pulse 78   Ht 5\' 4"  (1.626 m)   Wt 96 lb (43.5 kg)   LMP 02/11/2021   SpO2 96%   BMI 16.48 kg/m    Wt Readings from Last 3 Encounters:  03/29/24 96 lb (43.5 kg)  02/25/24 95 lb (43.1 kg)  02/17/24 96 lb (43.5 kg)    GEN: Thin in no acute distress NECK: No JVD; No carotid bruits CARDIAC: RRR, no murmurs, no rubs, no gallops RESPIRATORY:  Clear to auscultation without rales, wheezing or rhonchi  ABDOMEN: Soft, non-tender, non-distended EXTREMITIES:  No edema; No deformity   ASSESSMENT AND PLAN: .    Assessment and Plan Assessment & Plan Syncope - no recent episodes Episodes characterized by sudden shortness of breath, palpitations, and loss of consciousness. Implantable loop recorder shows no adverse arrhythmias. Recent chest tightness without syncope, EKG showed borderline prolonged QT interval. Potassium levels were low, likely due to recent kidney stone. No heart damage or ST changes on EKG. - Ensure adequate potassium intake and recheck levels with primary care provider. - Follow up with electrophysiologist Dr. Carolynne Citron or her team for further evaluation of long QT syndrome.  Long QT Syndrome Intermittent borderline prolonged QT interval on EKG. No new findings on recent device interrogation. Congenital deafness and family history of sudden death suggest possible genetic predisposition. Continuous monitoring with implantable loop recorder for arrhythmias. - Follow up with electrophysiologist Dr. Carolynne Citron or her team for management of long QT syndrome. - Monitor for symptoms such as syncope or palpitations and use  implantable loop recorder to document events.  Hypokalemia Low potassium levels, secondary to kidney stone she believes. Potassium supplementation initiated in the ER. Previous potassium levels were within normal range over one year ago. - Continue potassium supplementation as prescribed. - Recheck potassium levels with primary care  provider.         Signed, Dorothye Gathers, MD

## 2024-03-31 NOTE — Addendum Note (Signed)
 Addended by: Lott Rouleau A on: 03/31/2024 09:24 AM   Modules accepted: Orders

## 2024-03-31 NOTE — Progress Notes (Signed)
 [facility] Summary Report / Loop Recorder

## 2024-04-01 ENCOUNTER — Ambulatory Visit: Payer: Self-pay | Admitting: *Deleted

## 2024-04-01 ENCOUNTER — Ambulatory Visit (INDEPENDENT_AMBULATORY_CARE_PROVIDER_SITE_OTHER): Payer: BC Managed Care – PPO

## 2024-04-01 DIAGNOSIS — R55 Syncope and collapse: Secondary | ICD-10-CM

## 2024-04-01 NOTE — Telephone Encounter (Signed)
  Chief Complaint: hematuria Symptoms: lower back/flank pain, lower abdominal pain, red blood in urine, urinary frequency, 1 episode of vomiting yesterday Frequency: intermittent x 1 week, worsened today Pertinent Negatives: Patient denies fever, blood clots. Disposition: [] ED /[x] Urgent Care (no appt availability in office) / [] Appointment(In office/virtual)/ []  [facility]/ [] Home Care/ [] Refused Recommended Disposition /[] [facility]/ []  Follow-up with PCP Additional Notes: Patient called back stating the phone call was disconnected. Blood in urine intermittent for the past week. Patient states she has run out of the tamsulosin  and oxycodone . Patient was seen in the ED 03/25/24 and diagnosed with a kidney stone. Advised that due to the office being closed patient would need to seek care at urgent care or ED. Patient asking if we are able to refill the medications. Called and spoke with on call provider, Dr Therese Flash. He states he is not able to refill the prescriptions for the patient (as oxycodone  is a high risk medication and he has not examined her)  and would advise she be evaluated in person. Advised patient go to ED or urgent care within 24 hours. Patient asked if she would also be able to make an appointment to follow up with [facility] on Monday. Advised she can make an appt to follow up but she still needs to seek medical care at urgent care in the meantime.  Copied from CRM [id number]. Topic: Clinical - Red Word Triage >> Apr 01, 2024 10:52 AM Clyde Darling P wrote: Red Word that prompted transfer to Nurse Triage: Pt was discharged from ER last weekend, kidney stone still has not past and blood is still in her urine, pt state she is in pain     Reason for Disposition  Pain or burning with passing urine  Additional Information  Commented on: Answer Assessment    1. She states it has been yellowish to brownish. She states today it turned more bright red. Denies blood  clots.  2.Intermittent for the last week. States increased in frequency and increased discomfort today.  3.On average once daily, and twice today.  4.Yes. Moderate to severe discomfort.  5. Denies fever.  6. Somewhat more frequently.  7.Lower back/flank pain, lower abdominal pain, vomiting (1 episode yesterday)  Protocols used: Urine - Blood In-A-AH

## 2024-04-01 NOTE — Telephone Encounter (Signed)
 Copied from CRM [id number]. Topic: Clinical - Red Word Triage >> Apr 01, 2024 10:52 AM Ernestene P wrote: Red Word that prompted transfer to Nurse Triage: Pt was discharged from ER last weekend, kidney stone still has not past and blood is still in her urine, pt state she is in pain Reason for Disposition  Side (flank) or back pain present  Answer Assessment - Initial Assessment Questions 1. COLOR of URINE: Describe the color of the urine.  (e.g., tea-colored, pink, red, bloody) Do you have blood clots in your urine? (e.g., none, pea, grape, small coin)     I have a kidney stone.   I was seen in ED last weekend.  I might need some rx renewed.    I was hoping the stone would pass but it has not.  I'm Tamsolum 0.4 mg and oxycodone  pain.    2. ONSET: When did the bleeding start?      I'm still having blood in my urine too.     3. EPISODES: How many times has there been blood in the urine? or How many times today?     Blood and pain when I urinate.      4. PAIN with URINATION: Is there any pain with passing your urine? If Yes, ask: How bad is the pain?  (Scale 1-10; or mild, moderate, severe)    - MILD: Complains slightly about urination hurting.    - MODERATE: Interferes with normal activities.      - SEVERE: Excruciating, unwilling or unable to urinate because of the pain.      Severe pain in my back and lower abd 5. FEVER: Do you have a fever? If Yes, ask: What is your temperature, how was it measured, and when did it start?     Not asked 6. ASSOCIATED SYMPTOMS: Are you passing urine more frequently than usual?     Blood in urine.   I have not passed the stone yet. 7. OTHER SYMPTOMS: Do you have any other symptoms? (e.g., back/flank pain, abdomen pain, vomiting)     Flank and abd pain 8. PREGNANCY: Is there any chance you are pregnant? When was your last menstrual period?     N/A  Protocols used: Urine - Blood In-A-AH  Chief Complaint: Having pain from a kidney  stone and having blood in urine.   Requesting a refill of the oxycodone  and Flomax  0.4 mg Symptoms: BLood in urine, flank pain, and pain with urination Frequency: All week Pertinent Negatives: Patient denies passing the stone yet.   She is filtering it with a strainer given to her in the ED.    Disposition: [] ED /[] Urgent Care (no appt availability in office) / [] Appointment(In office/virtual)/ []  [facility]/ [] Home Care/ [] Refused Recommended Disposition /[] [facility]/ [x]  Follow-up with PCP Additional Notes: On call PCP to be notified for refills of the above listed medications.   Pt said the ED was going to leave the chart open so her PCP could see details of her visit.    She had called the ED for a refill but they referred her to her PCP.   Her PCP office is closed for the Good Friday/Easter holiday

## 2024-04-02 ENCOUNTER — Ambulatory Visit
Admission: RE | Admit: 2024-04-02 | Discharge: 2024-04-02 | Disposition: A | Discharge status: Home / Self Care | Source: Ambulatory Visit | Attending: Family Medicine

## 2024-04-02 VITALS — BP 100/65 | HR 67 | Temp 98.1°F | Resp 18

## 2024-04-02 DIAGNOSIS — N200 Calculus of kidney: Secondary | ICD-10-CM | POA: Diagnosis not present

## 2024-04-02 LAB — POCT URINALYSIS DIP (MANUAL ENTRY)
Glucose, UA: NEGATIVE mg/dL
Nitrite, UA: NEGATIVE
Protein Ur, POC: 30 mg/dL — AB
Spec Grav, UA: >=1.03 — AB
Urobilinogen, UA: 0.2 U/dL
pH, UA: 6.5

## 2024-04-02 LAB — CUP PACEART REMOTE DEVICE CHECK
Date Time Interrogation Session: 20250417231019
Implantable Pulse Generator Implant Date: 20231117

## 2024-04-02 MED ORDER — TAMSULOSIN HCL 0.4 MG PO CAPS: 0.4000 mg | ORAL_CAPSULE | Freq: Every day | ORAL | 0 refills | Status: DC

## 2024-04-02 MED ORDER — HYDROCODONE-ACETAMINOPHEN 5-325 MG PO TABS
1.0000 | ORAL_TABLET | Freq: Four times a day (QID) | ORAL | 0 refills | Status: DC | PRN
Start: 2024-04-02 — End: 2024-08-19

## 2024-04-02 MED ORDER — KETOROLAC TROMETHAMINE 30 MG/ML IJ SOLN
15.0000 mg | Freq: Once | INTRAMUSCULAR | Status: AC
  Administered 2024-04-02: 15 mg via INTRAMUSCULAR

## 2024-04-02 NOTE — Discharge Instructions (Signed)
 I have sent you a refill for the Flomax  and you may take Norco every 6 hours as needed for your pain. You were given a Toradol  injection in clinic today. Do not take any over the counter NSAID's such as Advil , ibuprofen , Aleve , or naproxen  for 24 hours.  Please follow-up with your PCP at your scheduled appointment in 2 days.  Please go to the ER if you develop any worsening symptoms prior to seeing your PCP.  This includes but is not limited to worsening or uncontrolled pain, inability to urinate, fevers or chills, or any new concerns that arise.  Hope you feel better soon!

## 2024-04-02 NOTE — ED Provider Notes (Signed)
 [facility]    CSN: [id number] Arrival date & time: 04/02/24  1033      History   Chief Complaint Chief Complaint  Patient presents with   Abdominal Pain    caused from kidney stone - Entered by patient   Medication Refill    HPI [name] is a 33 y.o. female presents for kidney stone.  Patient was seen in the emergency room on 4/11 for dysuria and hematuria x 2 weeks.  She has been treated for UTI by her PCP with Macrobid and Bactrim with persistent symptoms.  CT abdomen and pelvis showed a 5 mm right renal stone.  She was given Toradol  in the clinic as well as discharged on fosfomycin, Flomax , and oxycodone .  Patient states she does not believe she has passed the stone as she is continuing to have suprapubic pain and right low back pain.  Denies any difficulty urinating but does still have some blood in the urine per patient.  No fevers or chills.  States she did have improvement on those medications but she ran out yesterday.  She states she try to get in urology but due to the Easter holiday was unable to and is hoping to get in next week.  She did contact her PCP office and she has an appointment in 2 days for follow-up but they did recommend she go to the ER for refill of her medications.  No other concerns at this time.  Abdominal Pain Associated symptoms: hematuria   Medication Refill   Past Medical History:  Diagnosis Date   Acne    Anemia    hx of   Anxiety    Asthma    Carpal tunnel syndrome    right   Complex partial seizures (HCC)    last seizure 02-20-3021   Complication of anesthesia    slow to wake up, disoriented, hx of  mild seizure after surgery -2019 gallbladder    Coordination problem    Cyst of brain    states is collection of scar tissue - was kicked by a horse as a teenager   Difficult intravenous access    RIGHT CHEST PAC   Dysrhythmia    syncope,  long QT   Eating disorder    Family history of adverse reaction to anesthesia    MGM-  diffulty intubating, slow to awaken.   Gallstones    GERD (gastroesophageal reflux disease)    H/O absence seizures    Head injury, intracranial, with concussion    Hearing loss    both ears   History of asthma    no current med.   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)    Migraines    Pneumonia 12/2018   hx of    PONV (postoperative nausea and vomiting)    PTSD (post-traumatic stress disorder)    Seizures (HCC)    complex partial   Shoulder dislocation 09/2018   Left   Shoulder subluxation, left    Syncope    Tremor, unspecified    bilateral arms, comes and goes   Wears glasses    Wears hearing aid    bilateral    Patient Active Problem List   Diagnosis Date Noted   Cryptogenic stroke (HCC) 10/31/2022   Sinus bradycardia 10/13/2022   Prolonged Q-T interval on ECG 10/13/2022   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   BRCA2 gene mutation positive in female 03/08/2022   S/P bilateral mastectomy 02/28/2022   Breast cancer (HCC) 02/25/2022   Port-A-Cath in place 12/03/2021   Dysmenorrhea 03/12/2021   Problem with vascular access 02/16/2020   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   Pneumomediastinum (HCC) 03/17/2019   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   Syncope 05/21/2018   Cellulitis of chest wall 10/07/2017   Infected venous access port, initial encounter 10/07/2017   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   Partial idiopathic epilepsy with seizures of localized onset, intractable, without status epilepticus (HCC) 07/12/2015   Hypoglycemia 06/12/2014   Migraine headache 06/29/2013   Asthma 11/27/2011   Muscle weakness of lower extremity 11/26/2011   Acute appendicitis with localized peritonitis 11/26/2011   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   Neuropathy, 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   HEARING LOSS, BILATERAL  05/25/2008    Past Surgical History:  Procedure Laterality Date   BIOPSY  10/20/2022   Procedure: BIOPSY;  Surgeon: Lindle Rhea, MD;  Location: [facility];  Service: Gastroenterology;;   Ritta Chessman RELEASE Right 08/18/2023   Procedure: CARPAL TUNNEL RELEASE;  Surgeon: Dayne Even, MD;  Location: [facility];  Service: Orthopedics;  Laterality: Right;   CARPAL TUNNEL RELEASE Left 10/27/2023   Procedure: LEFT CARPAL TUNNEL RELEASE;  Surgeon: Dayne Even, MD;  Location: [facility];  Service: Orthopedics;  Laterality: Left;   CHOLECYSTECTOMY N/A 10/05/2018   Procedure: LAPAROSCOPIC CHOLECYSTECTOMY WITH INTRAOPERATIVE CHOLANGIOGRAM ERAS PATHWAY;  Surgeon: Juanita Norlander, MD;  Location: [facility];  Service: General;  Laterality: N/A;   CYSTOSCOPY N/A 03/12/2021   Procedure: CYSTOSCOPY;  Surgeon: Loa Riling, DO;  Location: [facility];  Service: Gynecology;  Laterality: N/A;   ESOPHAGOGASTRODUODENOSCOPY (EGD) WITH PROPOFOL  N/A 10/20/2022   Procedure: ESOPHAGOGASTRODUODENOSCOPY (EGD) WITH PROPOFOL ;  Surgeon: Lindle Rhea, MD;  Location: [facility];  Service: Gastroenterology;  Laterality: N/A;   LAPAROSCOPIC APPENDECTOMY  11/26/2011   Procedure: APPENDECTOMY LAPAROSCOPIC;  Surgeon: Fran Imus, MD;  Location: [facility];  Service: General;  Laterality: N/A;   left shoulder manipulation     in er multilple times last done july 2021   MASTECTOMY     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   NIPPLE SPARING MASTECTOMY Bilateral 02/25/2022   Procedure: BILATERAL NIPPLE-SPARING MASTECTOMY;  Surgeon: Lockie Rima, MD;  Location: [facility];  Service: General;  Laterality: Bilateral;   PORT-A-CATH REMOVAL N/A 12/10/2021   Procedure: REMOVAL PORT-A-CATH AND REPLACEMENT;  Surgeon: Lujean Sake, MD;  Location: [facility];  Service: General;  Laterality: N/A;   PORTACATH PLACEMENT Right 08/28/2017   Procedure: POWER PORT PLACEMENT;  Surgeon: Juanita Norlander, MD;  Location:  [facility];  Service: General;  Laterality: Right;   PORTACATH PLACEMENT N/A 12/04/2017   Procedure: INSERTION PORT-A-CATH;  Surgeon: Juanita Norlander, MD;  Location: [facility];  Service: General;  Laterality: N/A;   RADIOLOGY WITH ANESTHESIA Left 10/25/2019   Procedure: MRI  LEFT SHOLDER WITHOUT CONTRAST;  Surgeon: Radiologist, Medication, MD;  Location: [facility];  Service: Radiology;  Laterality: Left;   RADIOLOGY WITH ANESTHESIA Bilateral 11/19/2021   Procedure: MRI WITH ANESTHESIA OF BILATERAL BREASTS WITH AND WITHOUT CONTRAST;  Surgeon: Radiologist, Medication, MD;  Location: [facility];  Service: Radiology;  Laterality: Bilateral;   RADIOLOGY WITH ANESTHESIA N/A 02/24/2023   Procedure: MRI LEFT SHOULDER WITHOUT CONTRAST WITH ANESTHESIA;  Surgeon: Radiologist, Medication, MD;  Location: [facility];  Service: Radiology;  Laterality: N/A;   RADIOLOGY WITH ANESTHESIA Bilateral 02/25/2024   Procedure: BREAST BILATERAL MRI WITH AND WITHOUT CONTRASTWITH ANESTHESIA;  Surgeon: Radiologist, Medication, MD;  Location: [facility];  Service: Radiology;  Laterality: Bilateral;   REPAIR ANKLE LIGAMENT Left    x 3   TOTAL LAPAROSCOPIC HYSTERECTOMY WITH SALPINGECTOMY Bilateral 03/12/2021   Procedure: TOTAL LAPAROSCOPIC HYSTERECTOMY WITH SALPINGECTOMY;  Surgeon: Loa Riling, DO;  Location: [facility];  Service: Gynecology;  Laterality: Bilateral;    OB History     Gravida  0   Para  0   Term  0   Preterm  0   AB  0   Living  0      SAB  0   IAB  0   Ectopic  0   Multiple  0   Live Births  0            Home Medications    Prior to Admission medications   Medication Sig Start Date End Date Taking? Authorizing Provider  albuterol  (VENTOLIN  HFA) 108 (90 Base) MCG/ACT inhaler Inhale 2 puffs into the lungs every 6 (six) hours as needed for wheezing or shortness of breath. 09/19/21  Yes Nafziger, Randel Buss, NP  buPROPion (WELLBUTRIN XL) 300 MG 24 hr tablet Take 300 mg by mouth 2 (two) times  daily. 03/31/22  Yes [provider]  busPIRone (BUSPAR) 15 MG tablet Take 15 mg by mouth 2 (two) times daily. 04/29/22  Yes [provider]  fluticasone  furoate-vilanterol (BREO ELLIPTA ) 100-25 MCG/ACT AEPB Inhale 1 puff into the lungs daily. 02/03/23  Yes Nafziger, Randel Buss, NP  gabapentin  (NEURONTIN ) 100 MG capsule Take 100 mg by mouth at bedtime.   Yes [provider]  HYDROcodone -acetaminophen  (NORCO/VICODIN) 5-325 MG tablet Take 1 tablet by mouth every 6 (six) hours as needed for severe pain (pain score 7-10). 04/02/24  Yes Nakota Elsen, Jodi R, NP  ibuprofen  (ADVIL ) 800 MG tablet Take 1 tablet (800 mg total) by mouth every 8 (eight) hours as needed. 03/25/24  Yes Rexie Catena, PA-C  Lacosamide  (VIMPAT ) 100 MG TABS Take 1 tablet in AM, 2 tablets in PM Patient taking differently: Take 3 tablets by mouth See admin instructions. Take 1 tablet in AM, 2 tablets in PM 06/26/23  Yes Jhonny Moss, MD  montelukast  (SINGULAIR ) 10 MG tablet TAKE 1 TABLET BY MOUTH EVERYDAY AT BEDTIME Patient taking differently: Take 10 mg by mouth at bedtime. 09/15/23  Yes Nafziger, Randel Buss, NP  MOTEGRITY  2 MG TABS TAKE 1 TABLET BY MOUTH EVERY DAY Patient taking differently: Take 2 mg by mouth daily. 09/19/22  Yes Lindle Rhea, MD  PARoxetine (PAXIL) 10 MG tablet Take 10 mg by mouth at bedtime. 12/10/23  Yes [provider]  perampanel  (FYCOMPA ) 8 MG tablet Take 1 tablet (8 mg total) by mouth at bedtime. Take 1 tablet every night 06/26/23  Yes Jhonny Moss, MD  topiramate  (TOPAMAX ) 100 MG tablet Take 1 and 1/2 tablets (150mg ) twice a day 11/26/23  Yes Jhonny Moss, MD  acetaminophen  (TYLENOL ) 325 MG tablet Take 2 tablets (650 mg total) by mouth every 6 (six) hours as needed (pain). 12/10/21   Lujean Sake, MD  Continuous Blood Gluc Sensor (FREESTYLE LIBRE 2 SENSOR) MISC USE WITH LIBRE APP 08/19/22   Alto Atta, NP  Continuous Blood Gluc Transmit (DEXCOM G6 TRANSMITTER) MISC Use with  dexcom receiver and sensor 07/03/21   Nafziger, Randel Buss, NP  Glucagon , rDNA, (GLUCAGON  EMERGENCY) 1  MG KIT Use PRN for hypoglycemia 10/09/20   Alto Atta, NP  glucose monitoring kit (FREESTYLE) monitoring kit 1 each by Does not apply route as needed for other. Free style libre on right arm    [provider]  levETIRAcetam  (KEPPRA ) 500 MG tablet Take 500 mg by mouth 2 (two) times daily. Patient not taking: Reported on 02/25/2024 09/12/23   [provider]  LORazepam  (ATIVAN ) 0.5 MG tablet TAKE 1 TABLET AS NEEDED FOR SEIZURE. DO NOT TAKE MORE THAN 2-3 A WEEK. 02/19/24   Jhonny Moss, MD  potassium chloride  (KLOR-CON ) 10 MEQ tablet Take 1 tablet (10 mEq total) by mouth daily for 7 days. 03/25/24 04/01/24  Rexie Catena, PA-C  tamsulosin  (FLOMAX ) 0.4 MG CAPS capsule Take 1 capsule (0.4 mg total) by mouth daily after breakfast. 04/02/24   Alleen Arbour, NP    Family History Family History  Problem Relation Age of Onset   Polymyalgia rheumatica Mother    Heart disease Mother    Sudden Cardiac Death Father        long QT syndrome    Heart disease Maternal Grandmother    Breast cancer Maternal Grandmother    Stomach cancer Maternal Grandmother    Heart disease Maternal Grandfather    Stroke Maternal Grandfather    Diabetes Maternal Grandfather    COPD Maternal Grandfather    Kidney failure Maternal Grandfather    Colon cancer Paternal Grandmother 40   Heart disease Paternal Grandmother    Heart disease Paternal Grandfather    Migraines Paternal Grandfather    Esophageal cancer Cousin    Pancreatic cancer Neg Hx     Social History Social History   Tobacco Use   Smoking status: Never   Smokeless tobacco: Never  Vaping Use   Vaping status: Never Used  Substance Use Topics   Alcohol use: Never   Drug use: Never     Allergies   Cefprozil, Cephalosporins, Latex, Penicillins, Ciprofloxacin , Shellfish allergy, Grass pollen(k-o-r-t-swt vern), Morphine  and codeine ,  Iodine , Tegaderm ag mesh [silver], and Vancomycin    Review of Systems Review of Systems  Gastrointestinal:  Positive for abdominal pain.  Genitourinary:  Positive for hematuria.     Physical Exam Triage Vital Signs ED Triage Vitals [04/02/24 1046]  Encounter Vitals Group     BP 100/65     Systolic BP Percentile      Diastolic BP Percentile      Pulse Rate 67     Resp 18     Temp 98.1 F (36.7 C)     Temp Source Oral     SpO2 100 %     Weight      Height      Head Circumference      Peak Flow      Pain Score 5     Pain Loc      Pain Education      Exclude from Growth Chart    No data found.  Updated Vital Signs BP 100/65 (BP Location: Right Arm)   Pulse 67   Temp 98.1 F (36.7 C) (Oral)   Resp 18   LMP 02/11/2021   SpO2 100%   Visual Acuity Right Eye Distance:   Left Eye Distance:   Bilateral Distance:    Right Eye Near:   Left Eye Near:    Bilateral Near:     Physical Exam Vitals and nursing note reviewed.  Constitutional:      General: She is not  in acute distress.    Appearance: Normal appearance. She is not ill-appearing, toxic-appearing or diaphoretic.  HENT:     Head: Normocephalic and atraumatic.  Eyes:     Pupils: Pupils are equal, round, and reactive to light.  Cardiovascular:     Rate and Rhythm: Normal rate.  Pulmonary:     Effort: Pulmonary effort is normal.  Abdominal:     General: Bowel sounds are normal.     Palpations: Abdomen is soft.     Tenderness: There is abdominal tenderness in the suprapubic area.  Musculoskeletal:     Lumbar back: Tenderness present. No swelling, edema, deformity, signs of trauma, lacerations, spasms or bony tenderness. Normal range of motion. Negative right straight leg raise test and negative left straight leg raise test. No scoliosis.       Back:  Skin:    General: Skin is warm and dry.  Neurological:     General: No focal deficit present.     Mental Status: She is alert and oriented to person,  place, and time.  Psychiatric:        Mood and Affect: Mood normal.        Behavior: Behavior normal.      UC Treatments / Results  Labs (all labs ordered are listed, but only abnormal results are displayed) Labs Reviewed  POCT URINALYSIS DIP (MANUAL ENTRY) - Abnormal; Notable for the following components:      Result Value   Color, UA red (*)    Clarity, UA cloudy (*)    Bilirubin, UA small (*)    Ketones, POC UA trace (5) (*)    Spec Grav, UA >=1.030 (*)    Blood, UA large (*)    Protein Ur, POC =30 (*)    Leukocytes, UA Trace (*)    All other components within normal limits   Comprehensive metabolic panel Order: [id number]  Status: Final result     Next appt: 04/04/2024 at 08:00 AM in Family [facility], NP)   Test Result Released: Yes (seen)   0 Result Notes          Component Ref Range & Units (hover) 8 d ago (03/25/24) 1 mo ago (02/25/24) 5 mo ago (10/13/23) 8 mo ago (07/29/23) 1 yr ago (02/13/23) 1 yr ago (05/22/22) 2 yr ago (02/21/22)  Sodium 134 Low  136 136 134 Low  138 R 139 R 136  Potassium 2.8 Low  2.3 Low Panic  CM 3.0 Low  3.2 Low  4.2 R 3.6 R 4.2  Chloride 98 104 111 106 106 R 102 R 101  CO2 25 19 Low  21 Low  19 Low  22 R 26 R 25  Glucose, Bld 76 83 CM 90 CM 80 CM 69 R, CM 74 92 CM  Comment: Glucose reference range applies only to samples taken after fasting for at least 8 hours.  BUN 24 High  16 16 20 15  R 13 R 16  Creatinine, Ser 1.16 High  0.78 0.69 0.71 0.71 R 0.83 R 0.92  Calcium 8.6 Low  8.0 Low  8.6 Low  8.5 Low  9.0 R 9.8 R 9.5  Total Protein 6.1 Low   6.6   7.3 R   Albumin  4.3  4.0   4.8 R   AST 19  24   20  R   ALT 15  25   15  R   Alkaline Phosphatase 42  55   58 R   Total Bilirubin  0.3  0.6 R   0.4 R   GFR, Estimated >60 >60 CM >60 CM >60 CM   >60 CM  Comment: (NOTE) Calculated using the CKD-EPI Creatinine Equation (2021)  Anion gap 11 13 CM 4 Low  CM 9 CM   10 CM  Comment: Performed at [facility], [address], [place], [place] [address]  Resulting Agency [facility] [place] [facility]        Specimen Collected: 03/25/24 13:23 Last Resulted: 03/25/24 16:22    EKG   Radiology No results found.  Procedures Procedures (including critical care time)  Medications Ordered in UC Medications  ketorolac  (TORADOL ) 30 MG/ML injection 15 mg (15 mg Intramuscular Given 04/02/24 1152)    Initial Impression / Assessment and Plan / UC Course  I have reviewed the triage vital signs and the nursing notes.  Pertinent labs & imaging results that were available during my care of the patient were reviewed by me and considered in my medical decision making (see chart for details).     Reviewed exam and symptoms with patient.  Reviewed ER visit on 4/11.  Patient was given Toradol  injection in clinic for her pain.  She was monitored for 10 minutes after injection with no reaction noted and tolerated well.  She was advised no NSAIDs for 24 hours and she verbalized understanding.  Will provide refill of her Flomax  and and send Rx for Norco to get her through to her appointment on Monday which is in 2 days.  Any additional refills will need to go through her PCP.  I did advise her to go to the ER for any worsening symptoms that occur prior to her seeing her PCP, red flags reviewed and patient verbalized understanding. Final Clinical Impressions(s) / UC Diagnoses   Final diagnoses:  Kidney stone on right side   Discharge Instructions   None    ED Prescriptions     Medication Sig Dispense Auth. Provider   tamsulosin  (FLOMAX ) 0.4 MG CAPS capsule Take 1 capsule (0.4 mg total) by mouth daily after breakfast. 5 capsule Shelley Pooley, Jodi R, NP   HYDROcodone -acetaminophen  (NORCO/VICODIN) 5-325 MG tablet Take 1 tablet by mouth every 6 (six) hours as needed for severe pain (pain score 7-10). 8 tablet Female Minish, Jodi R, NP      I have  reviewed the PDMP during this encounter.   Alleen Arbour, NP 04/02/24 1159

## 2024-04-02 NOTE — ED Triage Notes (Signed)
 Right lower Abdominal/ flank pain, pt states she was seen 03/25/24 and dx with a kidney stone. Pt is still having pain and states she has not passed the stone. Pt states she tried to make an appointment with urology and her pcp but she was not able to see them due to both not having any availability. When she called the office they told her to come to urgent care. Pt is looking to get her prescription refill of Flomax  and oxycodone .

## 2024-04-04 ENCOUNTER — Ambulatory Visit: Admitting: Family Medicine

## 2024-04-04 DIAGNOSIS — F840 Autistic disorder: Secondary | ICD-10-CM | POA: Diagnosis not present

## 2024-04-04 DIAGNOSIS — F4312 Post-traumatic stress disorder, chronic: Secondary | ICD-10-CM | POA: Diagnosis not present

## 2024-04-04 DIAGNOSIS — N201 Calculus of ureter: Secondary | ICD-10-CM | POA: Diagnosis not present

## 2024-04-05 ENCOUNTER — Other Ambulatory Visit: Payer: Self-pay | Admitting: Urology

## 2024-04-05 ENCOUNTER — Encounter: Payer: Self-pay | Admitting: Internal Medicine

## 2024-04-05 NOTE — Telephone Encounter (Signed)
 Noted! Pt has an appt. 4/24

## 2024-04-06 ENCOUNTER — Non-Acute Institutional Stay (HOSPITAL_COMMUNITY)
Admission: RE | Admit: 2024-04-06 | Discharge: 2024-04-06 | Disposition: A | Discharge status: Home / Self Care | Payer: BC Managed Care – PPO | Source: Ambulatory Visit | Attending: Internal Medicine | Admitting: Internal Medicine

## 2024-04-06 DIAGNOSIS — N201 Calculus of ureter: Secondary | ICD-10-CM

## 2024-04-06 DIAGNOSIS — F4312 Post-traumatic stress disorder, chronic: Secondary | ICD-10-CM | POA: Diagnosis not present

## 2024-04-06 DIAGNOSIS — Z95828 Presence of other vascular implants and grafts: Secondary | ICD-10-CM | POA: Insufficient documentation

## 2024-04-06 DIAGNOSIS — F840 Autistic disorder: Secondary | ICD-10-CM | POA: Diagnosis not present

## 2024-04-06 LAB — BASIC METABOLIC PANEL WITH GFR
Anion gap: 10 (ref 5–15)
BUN: 15 mg/dL (ref 6–20)
CO2: 25 mmol/L (ref 22–32)
Calcium: 8.9 mg/dL (ref 8.9–10.3)
Chloride: 105 mmol/L (ref 98–111)
Creatinine, Ser: 0.59 mg/dL (ref 0.44–1.00)
GFR, Estimated: >60 mL/min (ref >60)
Glucose, Bld: 88 mg/dL (ref 70–99)
Potassium: 2.9 mmol/L — ABNORMAL LOW (ref 3.5–5.1)
Sodium: 140 mmol/L (ref 135–145)

## 2024-04-06 MED ORDER — HEPARIN SOD (PORK) LOCK FLUSH 100 UNIT/ML IV SOLN
500.0000 [IU] | INTRAVENOUS | Status: AC | PRN
  Administered 2024-04-06: 500 [IU]
  Filled 2024-04-06: qty 5

## 2024-04-06 MED ORDER — SODIUM CHLORIDE 0.9% FLUSH
10.0000 mL | INTRAVENOUS | Status: AC | PRN
  Administered 2024-04-06: 10 mL

## 2024-04-06 NOTE — Progress Notes (Signed)
 PATIENT CARE CENTER NOTE   Provider: Alto Atta, NP  Procedure: PAC flush and labs  Note: Patient's right chest PAC accessed using sterile technique, and blood return noted. BMP ordered by Aimee Alf, MD and drawn from [facility]. PAC flushed with 0.9% Sodium Chloride  and Heparin . Pt tolerated with no issues. Labs will be faxed when resulted.  Pts next appointment scheduled for May 27th at 10 AM. Pt is alert, oriented, and ambulatory at discharge and accompanied by support person.

## 2024-04-07 ENCOUNTER — Ambulatory Visit: Attending: Internal Medicine | Admitting: Internal Medicine

## 2024-04-07 ENCOUNTER — Ambulatory Visit: Admitting: Adult Health

## 2024-04-07 DIAGNOSIS — F840 Autistic disorder: Secondary | ICD-10-CM | POA: Diagnosis not present

## 2024-04-07 DIAGNOSIS — F4312 Post-traumatic stress disorder, chronic: Secondary | ICD-10-CM | POA: Diagnosis not present

## 2024-04-07 NOTE — Patient Instructions (Addendum)
 DUE TO COVID-19 ONLY TWO VISITORS  (aged 34 and older)  ARE ALLOWED TO COME WITH YOU AND STAY IN THE WAITING ROOM ONLY DURING PRE OP AND PROCEDURE.   **NO VISITORS ARE ALLOWED IN THE SHORT STAY AREA OR RECOVERY ROOM!!**  IF YOU WILL BE ADMITTED INTO THE HOSPITAL YOU ARE ALLOWED ONLY FOUR SUPPORT PEOPLE DURING VISITATION HOURS ONLY (7 AM -8PM)   The support person(s) must pass our screening, gel in and out, and wear a mask at all times, including in the patient's room. Patients must also wear a mask when staff or their support person are in the room. Visitors GUEST BADGE MUST BE WORN VISIBLY  One adult visitor may remain with you overnight and MUST be in the room by 8 P.M.     Your procedure is scheduled on: 04/12/24   Report to [facility] Main Entrance    Report to admitting at : 10:00 AM   Call this number if you have problems the morning of surgery [phone]   Do not eat food :After Midnight.   After Midnight you may have the following liquids until : 9:00 AM DAY OF SURGERY  Water  Black Coffee (sugar ok, NO MILK/CREAM OR CREAMERS)  Tea (sugar ok, NO MILK/CREAM OR CREAMERS) regular and decaf                             Plain Jell-O (NO RED)                                           Fruit ices (not with fruit pulp, NO RED)                                     Popsicles (NO RED)                                                                  Juice: apple, WHITE grape, WHITE cranberry Sports drinks like Gatorade (NO RED)              FOLLOW ANY ADDITIONAL PRE OP INSTRUCTIONS YOU RECEIVED FROM YOUR SURGEON'S OFFICE!!!   Oral Hygiene is also important to reduce your risk of infection.                                    Remember - BRUSH YOUR TEETH THE MORNING OF SURGERY WITH YOUR REGULAR TOOTHPASTE  DENTURES WILL BE REMOVED PRIOR TO SURGERY PLEASE DO NOT APPLY "Poly grip" OR ADHESIVES!!!   Do NOT smoke after Midnight   Take these medicines the morning of surgery with A  SIP OF WATER : buspirone ,lacosamide ,topiramate ,bupropion ,tamsulosin .Use inhalers as usual and bring them.Tylenol ,lorazepam  as needed.                              You may not have any metal on your body including hair pins, jewelry, and body piercing  Do not wear make-up, lotions, powders, perfumes/cologne, or deodorant  Do not wear nail polish including gel and S&S, artificial/acrylic nails, or any other type of covering on natural nails including finger and toenails. If you have artificial nails, gel coating, etc. that needs to be removed by a nail salon please have this removed prior to surgery or surgery may need to be canceled/ delayed if the surgeon/ anesthesia feels like they are unable to be safely monitored.   Do not shave  48 hours prior to surgery.    Do not bring valuables to the hospital. [facility] IS NOT             RESPONSIBLE   FOR VALUABLES.   Contacts, glasses, or bridgework may not be worn into surgery.   Bring small overnight bag day of surgery.   DO NOT BRING YOUR HOME MEDICATIONS TO THE HOSPITAL. PHARMACY WILL DISPENSE MEDICATIONS LISTED ON YOUR MEDICATION LIST TO YOU DURING YOUR ADMISSION IN THE HOSPITAL!    Patients discharged on the day of surgery will not be allowed to drive home.  Someone NEEDS to stay with you for the first 24 hours after anesthesia.   Special Instructions: Bring a copy of your healthcare power of attorney and living will documents         the day of surgery if you haven't scanned them before.              Please read over the following fact sheets you were given: IF YOU HAVE QUESTIONS ABOUT YOUR PRE-OP INSTRUCTIONS PLEASE CALL [phone]    [facility] - Preparing for Surgery Before surgery, you can play an important role.  Because skin is not sterile, your skin needs to be as free of germs as possible.  You can reduce the number of germs on your skin by washing with CHG (chlorahexidine gluconate) soap before surgery.  CHG is an  antiseptic cleaner which kills germs and bonds with the skin to continue killing germs even after washing. Please DO NOT use if you have an allergy to CHG or antibacterial soaps.  If your skin becomes reddened/irritated stop using the CHG and inform your nurse when you arrive at Short Stay. Do not shave (including legs and underarms) for at least 48 hours prior to the first CHG shower.  You may shave your face/neck. Please follow these instructions carefully:  1.  Shower with CHG Soap the night before surgery and the  morning of Surgery.  2.  If you choose to wash your hair, wash your hair first as usual with your  normal  shampoo.  3.  After you shampoo, rinse your hair and body thoroughly to remove the  shampoo.                           4.  Use CHG as you would any other liquid soap.  You can apply chg directly  to the skin and wash                       Gently with a scrungie or clean washcloth.  5.  Apply the CHG Soap to your body ONLY FROM THE NECK DOWN.   Do not use on face/ open                           Wound or open sores. Avoid contact with eyes,  ears mouth and genitals (private parts).                       Wash face,  Genitals (private parts) with your normal soap.             6.  Wash thoroughly, paying special attention to the area where your surgery  will be performed.  7.  Thoroughly rinse your body with warm water  from the neck down.  8.  DO NOT shower/wash with your normal soap after using and rinsing off  the CHG Soap.                9.  Pat yourself dry with a clean towel.            10.  Wear clean pajamas.            11.  Place clean sheets on your bed the night of your first shower and do not  sleep with pets. Day of Surgery : Do not apply any lotions/deodorants the morning of surgery.  Please wear clean clothes to the hospital/surgery center.  FAILURE TO FOLLOW THESE INSTRUCTIONS MAY RESULT IN THE CANCELLATION OF YOUR SURGERY PATIENT  SIGNATURE_________________________________  NURSE SIGNATURE__________________________________  ________________________________________________________________________

## 2024-04-11 ENCOUNTER — Other Ambulatory Visit: Payer: Self-pay

## 2024-04-11 ENCOUNTER — Encounter (HOSPITAL_COMMUNITY): Payer: Self-pay

## 2024-04-11 ENCOUNTER — Encounter (HOSPITAL_COMMUNITY)
Admission: RE | Admit: 2024-04-11 | Discharge: 2024-04-11 | Disposition: A | Discharge status: Home / Self Care | Source: Ambulatory Visit | Attending: Urology

## 2024-04-11 DIAGNOSIS — N179 Acute kidney failure, unspecified: Secondary | ICD-10-CM | POA: Diagnosis not present

## 2024-04-11 DIAGNOSIS — N131 Hydronephrosis with ureteral stricture, not elsewhere classified: Secondary | ICD-10-CM | POA: Diagnosis not present

## 2024-04-11 DIAGNOSIS — Z01812 Encounter for preprocedural laboratory examination: Secondary | ICD-10-CM | POA: Insufficient documentation

## 2024-04-11 DIAGNOSIS — Z881 Allergy status to other antibiotic agents status: Secondary | ICD-10-CM | POA: Diagnosis not present

## 2024-04-11 DIAGNOSIS — N200 Calculus of kidney: Secondary | ICD-10-CM | POA: Diagnosis not present

## 2024-04-11 DIAGNOSIS — N202 Calculus of kidney with calculus of ureter: Secondary | ICD-10-CM | POA: Diagnosis not present

## 2024-04-11 DIAGNOSIS — J45909 Unspecified asthma, uncomplicated: Secondary | ICD-10-CM | POA: Diagnosis not present

## 2024-04-11 DIAGNOSIS — N132 Hydronephrosis with renal and ureteral calculous obstruction: Secondary | ICD-10-CM | POA: Diagnosis not present

## 2024-04-11 DIAGNOSIS — F840 Autistic disorder: Secondary | ICD-10-CM | POA: Diagnosis not present

## 2024-04-11 DIAGNOSIS — E8842 MERRF syndrome: Secondary | ICD-10-CM | POA: Diagnosis not present

## 2024-04-11 DIAGNOSIS — Z91041 Radiographic dye allergy status: Secondary | ICD-10-CM | POA: Diagnosis not present

## 2024-04-11 DIAGNOSIS — D8481 Immunodeficiency due to conditions classified elsewhere: Secondary | ICD-10-CM | POA: Diagnosis not present

## 2024-04-11 DIAGNOSIS — Z880 Allergy status to penicillin: Secondary | ICD-10-CM | POA: Diagnosis not present

## 2024-04-11 DIAGNOSIS — N201 Calculus of ureter: Secondary | ICD-10-CM | POA: Diagnosis not present

## 2024-04-11 DIAGNOSIS — G40209 Localization-related (focal) (partial) symptomatic epilepsy and epileptic syndromes with complex partial seizures, not intractable, without status epilepticus: Secondary | ICD-10-CM | POA: Diagnosis not present

## 2024-04-11 DIAGNOSIS — F4312 Post-traumatic stress disorder, chronic: Secondary | ICD-10-CM | POA: Diagnosis not present

## 2024-04-11 HISTORY — DX: Personal history of urinary calculi: Z87.442

## 2024-04-11 NOTE — Anesthesia Preprocedure Evaluation (Signed)
 Anesthesia Evaluation  Patient identified by MRN, date of birth, ID band Patient awake    Reviewed: Allergy & Precautions, NPO status , Patient's Chart, lab work & pertinent test results  History of Anesthesia Complications (+) PONV and history of anesthetic complications  Airway Mallampati: II  TM Distance: >3 FB Neck ROM: Full    Dental no notable dental hx.    Pulmonary asthma    Pulmonary exam normal        Cardiovascular negative cardio ROS Normal cardiovascular exam     Neuro/Psych  Headaches, Seizures -, Well Controlled,  PSYCHIATRIC DISORDERS Anxiety     PTSD (post-traumatic stress disorder)  Neuromuscular disease CVA, No Residual Symptoms    GI/Hepatic negative GI ROS, Neg liver ROS,,,  Endo/Other  negative endocrine ROS    Renal/GU negative Renal ROS     Musculoskeletal negative musculoskeletal ROS (+)    Abdominal   Peds  Hematology negative hematology ROS (+)   Anesthesia Other Findings RIGHT URETERAL STONE  Reproductive/Obstetrics Hcg negative                             Anesthesia Physical Anesthesia Plan  ASA: 2  Anesthesia Plan: General   Post-op Pain Management: Precedex    Induction: Intravenous  PONV Risk Score and Plan: 4 or greater and Ondansetron , Dexamethasone , Midazolam , Treatment may vary due to age or medical condition and Propofol  infusion  Airway Management Planned: LMA  Additional Equipment:   Intra-op Plan:   Post-operative Plan: Extubation in OR  Informed Consent: I have reviewed the patients History and Physical, chart, labs and discussed the procedure including the risks, benefits and alternatives for the proposed anesthesia with the patient or authorized representative who has indicated his/her understanding and acceptance.     Dental advisory given  Plan Discussed with: CRNA  Anesthesia Plan Comments: (PAT note 02/24/2024)        Anesthesia Quick Evaluation

## 2024-04-11 NOTE — Progress Notes (Signed)
 For Anesthesia: PCP - Alto Atta, NP  Cardiologist - Alois Arnt, MD  Neurology- Rayfield Cairo, MD  Bowel Prep reminder:  Chest x-ray -  EKG - 03/29/24 Stress Test -  ECHO - 10/04/18 Cardiac Cath -  Pacemaker/ICD device last checked: Pacemaker orders received: Device Rep notified: 07-03-2023  Loop recorder  - Medtonic ILR left upper chest  Spinal Cord Stimulator:N/A  Sleep Study - N/A CPAP -   Fasting Blood Sugar - N/A Checks Blood Sugar _____ times a day. 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 Evidently she knows this patient's seizure and psych history. Also her therapist needs to go with her to PACU: Foy Imam.  Patient denies shortness of breath, fever, cough and chest pain at PAT appointment   Patient verbalized understanding of instructions that were given to them at the PAT appointment. Patient was also instructed that they will need to review over the PAT instructions again at home before surgery.

## 2024-04-11 NOTE — H&P (Signed)
 33 year old female presented to the ER on 4/19 she initially had her CT scan on 411 for right proximal ureteral stone. She has suprapubic pain and low back pain.. She has no labs from the 19th her creatinine on on the 11th was 1.16 WBC 7.0 UA negative for infection.   PMH: Asthma, Anxiety, Carpal tunnel, GERD, seizures, PTSD,  Has anesthesiologist prefers Dr. Norrine Bedford   Right proximal ureteral stone:  04/04/24: CT has HU 500, pt on flomax , vitals WNL. Pain in the same location. Patient is having GH. Patient taking tylenol  ibuprofen . Pt has emesis and vomiting. Pt had smoothy 1 hour ago.     ALLERGIES: Penicillin Vancomycin     MEDICATIONS: None    GU PSH: No GU PSH     NON-GU PSH: No Non-GU PSH          GU PMH: No GU PMH     NON-GU PMH: No Non-GU PMH     FAMILY HISTORY: No Family History     SOCIAL HISTORY: No Social History     REVIEW OF SYSTEMS:     GU Review Female:  Patient denies frequent urination, hard to postpone urination, burning /pain with urination, get up at night to urinate, leakage of urine, stream starts and stops, trouble starting your stream, have to strain to urinate, and being pregnant.    Gastrointestinal (Upper):  Patient denies nausea, vomiting, and indigestion/ heartburn.    Gastrointestinal (Lower):  Patient denies diarrhea and constipation.    Constitutional:  Patient denies fever, night sweats, weight loss, and fatigue.    Skin:  Patient denies skin rash/ lesion and itching.    Eyes:  Patient denies blurred vision and double vision.    Ears/ Nose/ Throat:  Patient denies sore throat and sinus problems.    Hematologic/Lymphatic:  Patient denies swollen glands and easy bruising.    Cardiovascular:  Patient denies leg swelling and chest pains.    Respiratory:  Patient denies cough and shortness of breath.    Endocrine:  Patient denies excessive thirst.    Musculoskeletal:  Patient denies back pain and joint pain.    Neurological:  Patient denies  headaches and dizziness.    Psychologic:  Patient denies anxiety and depression.    VITAL SIGNS:       04/04/2024 03:27 PM     BP 966/4 mmHg     Pulse 51 /min     Temperature 97.8 F / 36.5 C     MULTI-SYSTEM PHYSICAL EXAMINATION:      Constitutional: Well-nourished. No physical deformities. Normally developed. Good grooming.     Respiratory: No labored breathing, no use of accessory muscles.      Cardiovascular: Normal temperature, normal extremity pulses, no swelling, no varicosities.     Skin: No paleness, no jaundice, no cyanosis. No lesion, no ulcer, no rash.     Musculoskeletal: Normal gait and station of head and neck.            Complexity of Data:   Source Of History:  Patient  Records Review:  Previous Patient Records  Urine Test Review:  Urinalysis  X-Ray Review: C.T. Abdomen/Pelvis: Reviewed Films. Reviewed Report. Right proximal ureteral stone mild upstream hydronephrosis. No other abnormalities in the kidneys or the ureters. No bladder abnormalities. Unable to visualize stone on KUB likely due to low Hounsfield units at 500.    PROCEDURES:    Visit Complexity - G2211      Urinalysis w/Scope  Dipstick Dipstick Cont'd Micro  Color: Amber Bilirubin: Neg mg/dL WBC/hpf: NS (Not Seen)  Appearance: Slightly Cloudy Ketones: Neg mg/dL RBC/hpf: 3 - 60/AVW  Specific Gravity: 1.025 Blood: 3+ ery/uL Bacteria: NS (Not Seen)  pH: 6.0 Protein: 1+ mg/dL Cystals: NS (Not Seen)  Glucose: Neg mg/dL Urobilinogen: 1.0 mg/dL Casts: NS (Not Seen)   Nitrites: Neg Trichomonas: Not Present   Leukocyte Esterase: Neg leu/uL Mucous: Present    Epithelial Cells: NS (Not Seen)    Yeast: NS (Not Seen)    Sperm: Not Present   Notes: micro performed on unspun urine due to QNS     ASSESSMENT:     ICD-10 Details  1 GU:  Ureteral calculus - N20.1    PLAN:   Medications  New Meds: Tamsulosin  HCl 0.4 MG Capsule 1 capsule PO Q HS #30 0 Refill(s)  Methocarbamol 750 MG Tablet 1 tablet PO Q 8  H #60 0 Refill(s)  Ondansetron  4 MG Tablet Disintegrating 1 tablet PO Q 8 H #30 0 Refill(s)  oxyCODONE  HCl 5 MG Tablet 1 tablet PO Q 6 H PRN #10 0 Refill(s)     Pharmacy Name:  [facility] [phone]    Address:  [address]     [place], [place] [address]    Phone:  [phone]    Fax:  [phone]   Orders  Labs BMP, Urine Culture  Document  Letter(s):  Created for Patient: Clinical Summary   Notes:  Right ureteral stone: 5 mm proximal stone pain is not changed likely stones not moving patient. Patient is stable today will set up with ureteroscopy next available. BMP ordered today. Patient also has requested special anesthesiologist help with her surgery due to her seizures. Anesthesiologist name is [employer].   UCX negative.   We discussed the risk benefits and alternatives to ureteroscopy. This includes bleeding, infection, damage to surrounding structures including the urethra, bladder, ureter, and kidney. With these possible injuries resulting in need for intervention in the future. We discussed inability to remove all the stone and requiring follow-up ureteroscopy. We also discussed the possibility of not being able to gain access to the kidney and the need for nephrostomy tube. Possibility of long-term stent was also discussed. The patient voiced their understanding and would like to proceed.

## 2024-04-11 NOTE — Progress Notes (Signed)
 Pt. Was called by RN to informed her that Dr. Gail Joseph is on vacation and that she will be not available tomorrow. Pt. Appreciated to be informed.

## 2024-04-12 ENCOUNTER — Ambulatory Visit (HOSPITAL_COMMUNITY): Payer: Self-pay | Admitting: Anesthesiology

## 2024-04-12 ENCOUNTER — Encounter (HOSPITAL_COMMUNITY): Payer: Self-pay | Admitting: Urology

## 2024-04-12 ENCOUNTER — Ambulatory Visit (HOSPITAL_COMMUNITY): Payer: Self-pay | Admitting: Physician Assistant

## 2024-04-12 ENCOUNTER — Ambulatory Visit (HOSPITAL_COMMUNITY)

## 2024-04-12 ENCOUNTER — Encounter (HOSPITAL_COMMUNITY)
Admission: RE | Disposition: A | Discharge status: Home / Self Care | Payer: Self-pay | Source: Home / Self Care | Attending: Urology

## 2024-04-12 ENCOUNTER — Inpatient Hospital Stay (HOSPITAL_COMMUNITY)
Admission: RE | Admit: 2024-04-12 | Discharge: 2024-04-15 | DRG: 660 | Disposition: A | Discharge status: Home / Self Care | Attending: Urology | Admitting: Urology

## 2024-04-12 DIAGNOSIS — D8481 Immunodeficiency due to conditions classified elsewhere: Secondary | ICD-10-CM | POA: Diagnosis present

## 2024-04-12 DIAGNOSIS — G40209 Localization-related (focal) (partial) symptomatic epilepsy and epileptic syndromes with complex partial seizures, not intractable, without status epilepticus: Secondary | ICD-10-CM | POA: Diagnosis present

## 2024-04-12 DIAGNOSIS — Z881 Allergy status to other antibiotic agents status: Secondary | ICD-10-CM

## 2024-04-12 DIAGNOSIS — Z880 Allergy status to penicillin: Secondary | ICD-10-CM

## 2024-04-12 DIAGNOSIS — N201 Calculus of ureter: Principal | ICD-10-CM | POA: Diagnosis present

## 2024-04-12 DIAGNOSIS — N179 Acute kidney failure, unspecified: Secondary | ICD-10-CM | POA: Diagnosis present

## 2024-04-12 DIAGNOSIS — J45909 Unspecified asthma, uncomplicated: Secondary | ICD-10-CM | POA: Diagnosis present

## 2024-04-12 DIAGNOSIS — N202 Calculus of kidney with calculus of ureter: Principal | ICD-10-CM | POA: Diagnosis present

## 2024-04-12 DIAGNOSIS — E8842 MERRF syndrome: Secondary | ICD-10-CM | POA: Diagnosis present

## 2024-04-12 DIAGNOSIS — N131 Hydronephrosis with ureteral stricture, not elsewhere classified: Secondary | ICD-10-CM | POA: Diagnosis present

## 2024-04-12 DIAGNOSIS — Z91041 Radiographic dye allergy status: Secondary | ICD-10-CM

## 2024-04-12 HISTORY — PX: CYSTOSCOPY W/ RETROGRADES: SHX1426

## 2024-04-12 HISTORY — PX: CYSTOSCOPY/URETEROSCOPY/HOLMIUM LASER/STENT PLACEMENT: SHX6546

## 2024-04-12 SURGERY — CYSTOSCOPY/URETEROSCOPY/HOLMIUM LASER/STENT PLACEMENT
Anesthesia: General | Laterality: Right

## 2024-04-12 MED ORDER — ACETAMINOPHEN 10 MG/ML IV SOLN
INTRAVENOUS | Status: AC
Start: 2024-04-12 — End: 2024-04-13
  Filled 2024-04-12: qty 100

## 2024-04-12 MED ORDER — PERAMPANEL 8 MG PO TABS
8.0000 mg | ORAL_TABLET | Freq: Every day | ORAL | Status: DC
  Administered 2024-04-13 – 2024-04-14 (×2): 8 mg via ORAL

## 2024-04-12 MED ORDER — HYDROMORPHONE HCL 1 MG/ML IJ SOLN
INTRAMUSCULAR | Status: AC
  Administered 2024-04-12: 0.5 mg via INTRAVENOUS
  Filled 2024-04-12: qty 1

## 2024-04-12 MED ORDER — DEXAMETHASONE SODIUM PHOSPHATE 10 MG/ML IJ SOLN
INTRAMUSCULAR | Status: AC
  Filled 2024-04-12: qty 1

## 2024-04-12 MED ORDER — FENTANYL CITRATE PF 50 MCG/ML IJ SOSY
PREFILLED_SYRINGE | INTRAMUSCULAR | Status: AC
  Administered 2024-04-12: 25 ug via INTRAVENOUS
  Filled 2024-04-12: qty 1

## 2024-04-12 MED ORDER — DIPHENHYDRAMINE HCL 50 MG/ML IJ SOLN: 12.5000 mg | Freq: Four times a day (QID) | INTRAMUSCULAR | Status: DC | PRN

## 2024-04-12 MED ORDER — MIDAZOLAM HCL 2 MG/2ML IJ SOLN
INTRAMUSCULAR | Status: AC
  Filled 2024-04-12: qty 2

## 2024-04-12 MED ORDER — LACOSAMIDE 50 MG PO TABS
100.0000 mg | ORAL_TABLET | Freq: Every day | ORAL | Status: DC
  Administered 2024-04-13 – 2024-04-15 (×3): 100 mg via ORAL
  Filled 2024-04-12 (×2): qty 2

## 2024-04-12 MED ORDER — KETOROLAC TROMETHAMINE 15 MG/ML IJ SOLN
15.0000 mg | Freq: Once | INTRAMUSCULAR | Status: AC
  Administered 2024-04-12: 15 mg via INTRAVENOUS

## 2024-04-12 MED ORDER — LIDOCAINE HCL (PF) 2 % IJ SOLN
INTRAMUSCULAR | Status: DC | PRN
  Administered 2024-04-12: 40 mg via INTRADERMAL

## 2024-04-12 MED ORDER — LORAZEPAM 0.5 MG PO TABS
0.5000 mg | ORAL_TABLET | Freq: Four times a day (QID) | ORAL | Status: DC | PRN
Start: 2024-04-12 — End: 2024-04-15

## 2024-04-12 MED ORDER — ACETAMINOPHEN 500 MG PO TABS
1000.0000 mg | ORAL_TABLET | Freq: Four times a day (QID) | ORAL | Status: DC
  Administered 2024-04-12 – 2024-04-15 (×7): 1000 mg via ORAL
  Filled 2024-04-12 (×8): qty 2

## 2024-04-12 MED ORDER — PERAMPANEL 8 MG PO TABS: 8.0000 mg | ORAL_TABLET | Freq: Every day | ORAL | Status: DC

## 2024-04-12 MED ORDER — AMISULPRIDE (ANTIEMETIC) 5 MG/2ML IV SOLN: 10.0000 mg | Freq: Once | INTRAVENOUS | Status: DC | PRN

## 2024-04-12 MED ORDER — BUPROPION HCL ER (XL) 300 MG PO TB24: 300.0000 mg | ORAL_TABLET | Freq: Two times a day (BID) | ORAL | Status: DC

## 2024-04-12 MED ORDER — OXYCODONE HCL 5 MG/5ML PO SOLN: 5.0000 mg | Freq: Once | ORAL | Status: AC | PRN

## 2024-04-12 MED ORDER — DEXMEDETOMIDINE HCL IN NACL 80 MCG/20ML IV SOLN
INTRAVENOUS | Status: DC | PRN
  Administered 2024-04-12 (×3): 4 ug via INTRAVENOUS

## 2024-04-12 MED ORDER — OXYCODONE HCL 5 MG PO TABS
ORAL_TABLET | ORAL | Status: AC
  Administered 2024-04-12: 5 mg via ORAL
  Filled 2024-04-12: qty 1

## 2024-04-12 MED ORDER — FLUTICASONE FUROATE-VILANTEROL 100-25 MCG/ACT IN AEPB
1.0000 | INHALATION_SPRAY | Freq: Every day | RESPIRATORY_TRACT | Status: DC
  Administered 2024-04-13 – 2024-04-15 (×3): 1 via RESPIRATORY_TRACT
  Filled 2024-04-12: qty 28

## 2024-04-12 MED ORDER — DEXMEDETOMIDINE HCL IN NACL 80 MCG/20ML IV SOLN
INTRAVENOUS | Status: AC
  Filled 2024-04-12: qty 20

## 2024-04-12 MED ORDER — KETOROLAC TROMETHAMINE 15 MG/ML IJ SOLN
15.0000 mg | Freq: Four times a day (QID) | INTRAMUSCULAR | Status: DC
  Administered 2024-04-13 – 2024-04-14 (×5): 15 mg via INTRAVENOUS
  Filled 2024-04-12 (×5): qty 1

## 2024-04-12 MED ORDER — METHOCARBAMOL 1000 MG/10ML IJ SOLN: 500.0000 mg | Freq: Once | INTRAMUSCULAR | Status: DC

## 2024-04-12 MED ORDER — CHLORHEXIDINE GLUCONATE 0.12 % MT SOLN
15.0000 mL | Freq: Once | OROMUCOSAL | Status: AC
  Administered 2024-04-12: 15 mL via OROMUCOSAL

## 2024-04-12 MED ORDER — SENNA 8.6 MG PO TABS
1.0000 | ORAL_TABLET | Freq: Two times a day (BID) | ORAL | Status: DC
Start: 2024-04-12 — End: 2024-04-15
  Administered 2024-04-12 – 2024-04-15 (×4): 8.6 mg via ORAL
  Filled 2024-04-12 (×4): qty 1

## 2024-04-12 MED ORDER — TAMSULOSIN HCL 0.4 MG PO CAPS
0.4000 mg | ORAL_CAPSULE | Freq: Every day | ORAL | Status: DC
  Administered 2024-04-15: 0.4 mg via ORAL
  Filled 2024-04-12: qty 1

## 2024-04-12 MED ORDER — HYDROMORPHONE HCL 1 MG/ML IJ SOLN
INTRAMUSCULAR | Status: AC
  Administered 2024-04-12: 1 mg via INTRAVENOUS
  Filled 2024-04-12: qty 1

## 2024-04-12 MED ORDER — HYDROMORPHONE HCL 1 MG/ML IJ SOLN
0.5000 mg | INTRAMUSCULAR | Status: DC | PRN
  Administered 2024-04-12: 0.5 mg via INTRAVENOUS
  Administered 2024-04-12 – 2024-04-15 (×16): 1 mg via INTRAVENOUS
  Filled 2024-04-12 (×17): qty 1

## 2024-04-12 MED ORDER — TOPIRAMATE 100 MG PO TABS
200.0000 mg | ORAL_TABLET | Freq: Every day | ORAL | Status: DC
  Administered 2024-04-13 – 2024-04-14 (×2): 200 mg via ORAL
  Filled 2024-04-12 (×2): qty 2

## 2024-04-12 MED ORDER — LACTATED RINGERS IV SOLN: INTRAVENOUS | Status: DC

## 2024-04-12 MED ORDER — DIPHENHYDRAMINE HCL 25 MG PO CAPS
50.0000 mg | ORAL_CAPSULE | Freq: Once | ORAL | Status: AC
  Administered 2024-04-13: 25 mg via ORAL

## 2024-04-12 MED ORDER — PROPOFOL 10 MG/ML IV BOLUS
INTRAVENOUS | Status: DC | PRN
  Administered 2024-04-12: 100 mg via INTRAVENOUS
  Administered 2024-04-12: 100 ug/kg/min via INTRAVENOUS

## 2024-04-12 MED ORDER — ALBUTEROL SULFATE HFA 108 (90 BASE) MCG/ACT IN AERS: 2.0000 | INHALATION_SPRAY | Freq: Four times a day (QID) | RESPIRATORY_TRACT | Status: DC | PRN

## 2024-04-12 MED ORDER — OXYCODONE HCL 5 MG PO TABS
ORAL_TABLET | ORAL | Status: AC
  Filled 2024-04-12: qty 1

## 2024-04-12 MED ORDER — SODIUM CHLORIDE 0.9 % IR SOLN
Status: DC | PRN
  Administered 2024-04-12: 3000 mL

## 2024-04-12 MED ORDER — LACOSAMIDE 50 MG PO TABS: 200.0000 mg | ORAL_TABLET | Freq: Every day | ORAL | Status: DC

## 2024-04-12 MED ORDER — LACOSAMIDE 100 MG PO TABS: 100.0000 mg | ORAL_TABLET | Freq: Two times a day (BID) | ORAL | Status: DC

## 2024-04-12 MED ORDER — LIDOCAINE HCL (PF) 2 % IJ SOLN
INTRAMUSCULAR | Status: AC
  Filled 2024-04-12: qty 5

## 2024-04-12 MED ORDER — DIPHENHYDRAMINE HCL 50 MG/ML IJ SOLN: 50.0000 mg | Freq: Once | INTRAMUSCULAR | Status: AC

## 2024-04-12 MED ORDER — PRUCALOPRIDE SUCCINATE 2 MG PO TABS: 1.0000 | ORAL_TABLET | Freq: Every day | ORAL | Status: DC

## 2024-04-12 MED ORDER — ONDANSETRON HCL 4 MG/2ML IJ SOLN
INTRAMUSCULAR | Status: DC | PRN
  Administered 2024-04-12: 4 mg via INTRAVENOUS

## 2024-04-12 MED ORDER — TAMSULOSIN HCL 0.4 MG PO CAPS: 0.4000 mg | ORAL_CAPSULE | Freq: Every day | ORAL | 0 refills | Status: DC

## 2024-04-12 MED ORDER — PHENAZOPYRIDINE HCL 200 MG PO TABS: 200.0000 mg | ORAL_TABLET | Freq: Three times a day (TID) | ORAL | 0 refills | Status: DC | PRN

## 2024-04-12 MED ORDER — PHENAZOPYRIDINE HCL 200 MG PO TABS
ORAL_TABLET | ORAL | Status: AC
  Filled 2024-04-12: qty 1

## 2024-04-12 MED ORDER — ONDANSETRON HCL 4 MG/2ML IJ SOLN
4.0000 mg | INTRAMUSCULAR | Status: DC | PRN
  Administered 2024-04-13 – 2024-04-14 (×2): 4 mg via INTRAVENOUS
  Filled 2024-04-12: qty 2

## 2024-04-12 MED ORDER — PHENAZOPYRIDINE HCL 200 MG PO TABS
200.0000 mg | ORAL_TABLET | Freq: Once | ORAL | Status: AC
  Administered 2024-04-12: 200 mg via ORAL

## 2024-04-12 MED ORDER — BUPROPION HCL ER (XL) 300 MG PO TB24
300.0000 mg | ORAL_TABLET | Freq: Every day | ORAL | Status: DC
  Administered 2024-04-13 – 2024-04-15 (×3): 300 mg via ORAL
  Filled 2024-04-12 (×2): qty 1

## 2024-04-12 MED ORDER — LACOSAMIDE 50 MG PO TABS
200.0000 mg | ORAL_TABLET | Freq: Every day | ORAL | Status: DC
  Administered 2024-04-13 – 2024-04-14 (×2): 200 mg via ORAL
  Filled 2024-04-12 (×2): qty 4

## 2024-04-12 MED ORDER — METHOCARBAMOL 500 MG PO TABS
500.0000 mg | ORAL_TABLET | Freq: Once | ORAL | Status: AC
  Administered 2024-04-12: 500 mg via ORAL

## 2024-04-12 MED ORDER — ORAL CARE MOUTH RINSE: 15.0000 mL | Freq: Once | OROMUCOSAL | Status: AC

## 2024-04-12 MED ORDER — LACOSAMIDE 50 MG PO TABS: 150.0000 mg | ORAL_TABLET | Freq: Two times a day (BID) | ORAL | Status: DC

## 2024-04-12 MED ORDER — METHOCARBAMOL 500 MG PO TABS
ORAL_TABLET | ORAL | Status: AC
  Filled 2024-04-12: qty 1

## 2024-04-12 MED ORDER — HYOSCYAMINE SULFATE 0.125 MG SL SUBL
SUBLINGUAL_TABLET | SUBLINGUAL | Status: AC
  Administered 2024-04-12: 0.125 mg via SUBLINGUAL
  Filled 2024-04-12: qty 1

## 2024-04-12 MED ORDER — DOCUSATE SODIUM 100 MG PO CAPS
100.0000 mg | ORAL_CAPSULE | Freq: Two times a day (BID) | ORAL | Status: DC
  Administered 2024-04-12 – 2024-04-15 (×4): 100 mg via ORAL
  Filled 2024-04-12 (×4): qty 1

## 2024-04-12 MED ORDER — HYOSCYAMINE SULFATE 0.125 MG SL SUBL: 0.1250 mg | SUBLINGUAL_TABLET | Freq: Four times a day (QID) | SUBLINGUAL | Status: DC

## 2024-04-12 MED ORDER — TOPIRAMATE 100 MG PO TABS
100.0000 mg | ORAL_TABLET | Freq: Every day | ORAL | Status: DC
  Administered 2024-04-13 – 2024-04-15 (×3): 100 mg via ORAL
  Filled 2024-04-12 (×2): qty 1

## 2024-04-12 MED ORDER — GENTAMICIN SULFATE 40 MG/ML IJ SOLN
5.0000 mg/kg | Freq: Once | INTRAVENOUS | Status: AC
  Administered 2024-04-12: 9570 mg via INTRAVENOUS
  Filled 2024-04-12: qty 5.5

## 2024-04-12 MED ORDER — HYOSCYAMINE SULFATE 0.125 MG PO TBDP
0.1250 mg | ORAL_TABLET | Freq: Four times a day (QID) | ORAL | 0 refills | Status: AC | PRN
Start: 2024-04-12 — End: ?

## 2024-04-12 MED ORDER — METHOCARBAMOL 500 MG PO TABS
1000.0000 mg | ORAL_TABLET | Freq: Four times a day (QID) | ORAL | Status: DC
  Administered 2024-04-12 – 2024-04-15 (×6): 1000 mg via ORAL
  Filled 2024-04-12 (×6): qty 2

## 2024-04-12 MED ORDER — HYDROMORPHONE HCL 1 MG/ML IJ SOLN: 0.5000 mg | Freq: Once | INTRAMUSCULAR | Status: AC

## 2024-04-12 MED ORDER — ONDANSETRON HCL 4 MG/2ML IJ SOLN
INTRAMUSCULAR | Status: AC
  Filled 2024-04-12: qty 2

## 2024-04-12 MED ORDER — METHOCARBAMOL 750 MG PO TABS: 750.0000 mg | ORAL_TABLET | Freq: Four times a day (QID) | ORAL | 0 refills | Status: AC

## 2024-04-12 MED ORDER — FENTANYL CITRATE PF 50 MCG/ML IJ SOSY
25.0000 ug | PREFILLED_SYRINGE | INTRAMUSCULAR | Status: AC | PRN
  Administered 2024-04-12 (×3): 25 ug via INTRAVENOUS

## 2024-04-12 MED ORDER — DIPHENHYDRAMINE HCL 25 MG PO CAPS
50.0000 mg | ORAL_CAPSULE | Freq: Once | ORAL | Status: DC
Start: 2024-04-13 — End: 2024-04-12

## 2024-04-12 MED ORDER — MONTELUKAST SODIUM 10 MG PO TABS
10.0000 mg | ORAL_TABLET | Freq: Every day | ORAL | Status: DC
  Administered 2024-04-12 – 2024-04-14 (×3): 10 mg via ORAL
  Filled 2024-04-12 (×3): qty 1

## 2024-04-12 MED ORDER — TOPIRAMATE 25 MG PO TABS: 150.0000 mg | ORAL_TABLET | Freq: Two times a day (BID) | ORAL | Status: DC

## 2024-04-12 MED ORDER — POLYETHYLENE GLYCOL 3350 17 G PO PACK: 17.0000 g | PACK | Freq: Every day | ORAL | Status: DC | PRN

## 2024-04-12 MED ORDER — OXYCODONE HCL 5 MG PO TABS
5.0000 mg | ORAL_TABLET | ORAL | Status: DC | PRN
  Administered 2024-04-13 – 2024-04-15 (×4): 5 mg via ORAL
  Filled 2024-04-12 (×4): qty 1

## 2024-04-12 MED ORDER — PREDNISONE 50 MG PO TABS
50.0000 mg | ORAL_TABLET | Freq: Four times a day (QID) | ORAL | Status: AC
  Administered 2024-04-12 – 2024-04-13 (×2): 50 mg via ORAL
  Filled 2024-04-12 (×2): qty 1

## 2024-04-12 MED ORDER — HYDROMORPHONE HCL 1 MG/ML IJ SOLN
0.5000 mg | Freq: Once | INTRAMUSCULAR | Status: AC
  Administered 2024-04-12: 0.5 mg via INTRAVENOUS

## 2024-04-12 MED ORDER — MIDAZOLAM HCL 2 MG/2ML IJ SOLN
INTRAMUSCULAR | Status: DC | PRN
  Administered 2024-04-12: 1 mg via INTRAVENOUS
  Administered 2024-04-12: 4 mg via INTRAVENOUS
  Administered 2024-04-12: 1 mg via INTRAVENOUS

## 2024-04-12 MED ORDER — ALBUTEROL SULFATE (2.5 MG/3ML) 0.083% IN NEBU: 2.5000 mg | INHALATION_SOLUTION | Freq: Four times a day (QID) | RESPIRATORY_TRACT | Status: DC | PRN

## 2024-04-12 MED ORDER — OXYCODONE HCL 5 MG PO TABS
5.0000 mg | ORAL_TABLET | Freq: Once | ORAL | Status: AC | PRN
  Administered 2024-04-12: 5 mg via ORAL

## 2024-04-12 MED ORDER — PROPOFOL 10 MG/ML IV BOLUS
INTRAVENOUS | Status: AC
  Filled 2024-04-12: qty 20

## 2024-04-12 MED ORDER — FENTANYL CITRATE (PF) 100 MCG/2ML IJ SOLN
INTRAMUSCULAR | Status: AC
  Filled 2024-04-12: qty 2

## 2024-04-12 MED ORDER — PROPOFOL 1000 MG/100ML IV EMUL
INTRAVENOUS | Status: AC
  Filled 2024-04-12: qty 100

## 2024-04-12 MED ORDER — BUSPIRONE HCL 5 MG PO TABS
15.0000 mg | ORAL_TABLET | Freq: Two times a day (BID) | ORAL | Status: DC
  Administered 2024-04-12 – 2024-04-15 (×6): 15 mg via ORAL
  Filled 2024-04-12: qty 1
  Filled 2024-04-12 (×4): qty 3
  Filled 2024-04-12: qty 1

## 2024-04-12 MED ORDER — KETOROLAC TROMETHAMINE 15 MG/ML IJ SOLN
INTRAMUSCULAR | Status: AC
Start: 2024-04-12 — End: 2024-04-12
  Administered 2024-04-12: 15 mg via INTRAVENOUS
  Filled 2024-04-12: qty 1

## 2024-04-12 MED ORDER — OXYCODONE HCL 5 MG PO TABS: 5.0000 mg | ORAL_TABLET | Freq: Four times a day (QID) | ORAL | 0 refills | Status: DC | PRN

## 2024-04-12 MED ORDER — ACETAMINOPHEN 10 MG/ML IV SOLN
650.0000 mg | Freq: Once | INTRAVENOUS | Status: DC | PRN
  Administered 2024-04-12: 650 mg via INTRAVENOUS

## 2024-04-12 MED ORDER — HYOSCYAMINE SULFATE 0.125 MG SL SUBL
0.2500 mg | SUBLINGUAL_TABLET | SUBLINGUAL | Status: DC | PRN
  Administered 2024-04-12 – 2024-04-14 (×4): 0.25 mg via SUBLINGUAL
  Filled 2024-04-12 (×2): qty 2

## 2024-04-12 MED ORDER — POTASSIUM CHLORIDE ER 10 MEQ PO TBCR
10.0000 meq | EXTENDED_RELEASE_TABLET | Freq: Every day | ORAL | Status: DC
  Administered 2024-04-15: 10 meq via ORAL
  Filled 2024-04-12 (×4): qty 1

## 2024-04-12 MED ORDER — KETOROLAC TROMETHAMINE 15 MG/ML IJ SOLN
INTRAMUSCULAR | Status: AC
  Filled 2024-04-12: qty 1

## 2024-04-12 MED ORDER — FENTANYL CITRATE (PF) 100 MCG/2ML IJ SOLN
INTRAMUSCULAR | Status: DC | PRN
Start: 2024-04-12 — End: 2024-04-12
  Administered 2024-04-12 (×2): 50 ug via INTRAVENOUS

## 2024-04-12 MED ORDER — PREDNISONE 50 MG PO TABS: 50.0000 mg | ORAL_TABLET | Freq: Four times a day (QID) | ORAL | Status: DC

## 2024-04-12 MED ORDER — TOPIRAMATE 100 MG PO TABS: 100.0000 mg | ORAL_TABLET | Freq: Every day | ORAL | Status: DC

## 2024-04-12 MED ORDER — DIPHENHYDRAMINE HCL 12.5 MG/5ML PO ELIX: 12.5000 mg | ORAL_SOLUTION | Freq: Four times a day (QID) | ORAL | Status: DC | PRN

## 2024-04-12 SURGICAL SUPPLY — 26 items
BAG COUNTER SPONGE SURGICOUNT (BAG) IMPLANT
BAG URO CATCHER STRL LF (MISCELLANEOUS) ×1 IMPLANT
BASKET ZERO TIP NITINOL 2.4FR (BASKET) IMPLANT
CATH URETL OPEN 5X70 (CATHETERS) ×1 IMPLANT
CLOTH BEACON ORANGE TIMEOUT ST (SAFETY) ×1 IMPLANT
EXTRACTOR STONE 1.7FRX115CM (UROLOGICAL SUPPLIES) IMPLANT
FIBER LASER MOSES 200 DFL (Laser) IMPLANT
FIBER LASER MOSES 365 DFL (Laser) IMPLANT
GLOVE BIO SURGEON STRL SZ8 (GLOVE) ×1 IMPLANT
GOWN STRL REUS W/ TWL XL LVL3 (GOWN DISPOSABLE) ×1 IMPLANT
GUIDEWIRE ANG ZIPWIRE 038X150 (WIRE) IMPLANT
GUIDEWIRE STR DUAL SENSOR (WIRE) ×1 IMPLANT
KIT TURNOVER KIT A (KITS) IMPLANT
LASER FIB FLEXIVA PULSE ID 365 (Laser) IMPLANT
LASER FIB FLEXIVA PULSE ID 550 (Laser) IMPLANT
LASER FIB FLEXIVA PULSE ID 910 (Laser) IMPLANT
MANIFOLD NEPTUNE II (INSTRUMENTS) ×1 IMPLANT
NS IRRIG 1000ML POUR BTL (IV SOLUTION) IMPLANT
PACK CYSTO (CUSTOM PROCEDURE TRAY) ×1 IMPLANT
SHEATH NAVIGATOR HD 12/14X28 (SHEATH) IMPLANT
SHEATH NAVIGATOR HD 12/14X36 (SHEATH) IMPLANT
STENT PERCUFLEX 4.8FRX24 (STENTS) IMPLANT
STENT URET 6FRX24 CONTOUR (STENTS) IMPLANT
TRACTIP FLEXIVA PULS ID 200XHI (Laser) IMPLANT
TUBING CONNECTING 10 (TUBING) ×1 IMPLANT
TUBING UROLOGY SET (TUBING) ×1 IMPLANT

## 2024-04-12 NOTE — Progress Notes (Signed)
 33 year old female with complex past medical history including severe anxiety, and seizure  disorder she has a right proximal ureteral stone ureteroscopy was attempted today but due to the caliber of the ureter a stent was placed.  In PACU patient was unable to tolerate stent placement.  Stent was removed in PACU.  Call was made to interventional radiology for percutaneous nephrostomy tube we will plan for right PCNL on Thursday, 04/14/2024.

## 2024-04-12 NOTE — Op Note (Signed)
 Preoperative diagnosis: right obstructing ureteral stone Postoperative diagnosis: Same  Procedures performed: Cystoscopy, right ureteroscopy, right ureteral stent placement Surgeon: Dr. Aimee Alf  Findings: 1.  No abnormalities found within the bladder 2.  Your right ureter would not allow single-lumen semirigid ureteroscope to pass. 3.  Right ureter would not accommodate 2 sensor wires. 4.  Attempted 6 French ureteral stent this was not able to pass 5.  4.8 French 24 cm ureteral stent placed in the right ureter without strings.  Per placement confirmed with fluoroscopy  Specimens: None  Indication: [name] is a 33 y.o. patient with right ureteral stone attempted to have right ureteroscopy laser lithotripsy today for stone symptoms.. After reviewing the management options for treatment, he elected to proceed with the above surgical procedure(s). We have discussed the potential benefits and risks of the procedure, side effects of the proposed treatment, the likelihood of the patient achieving the goals of the procedure, and any potential problems that might occur during the procedure or recuperation. Informed consent has been obtained.  Procedure in detail: Patient was consented prior to being brought back to the operating room. He was then brought back to the operating room placed on the table in supine position. General anesthesia was then induced and endotracheal tube inserted. This then placed in dorsolithotomy position and prepped and draped in the routine sterile fashion. A timeout was held.  Using a 22.5 [place] cystoscope with a 30  lens, I gently passed the scope into the patient's urethra and into the bladder under visual guidance. A 360 cystoscopic evaluation was performed with no mucosal abnormalities, no tumors, and no foreign bodies identified. I then passed a 0. 038 Sensor wire into the right ureteral orifice and into the right renal pelvis.  The decision was made to  avoid for this case due to the patient's allergy to iodine .  Single-lumen semirigid ureteroscope was attempted advanced into the ureter upon going several centimeters in the ureter there is noted significant narrowing a sensor wire was put through the scope to attempt to pass this area and would not accommodate a second sensor wire.  At this time the patient's family was called as the patient was very adamant about avoiding stent I explained to the the patient's mother that due to my inability to access the stone a stent would have to be placed and we could come back for a later date after discussing the risks the patient's mother agreed to have the stent placed.  Next the wire was backloaded on the scope and a 6 French stent was attempted placed in the right ureter the ureter would not accommodate a 6 French stent after multiple attempts the stent was removed and then a 4.8 [place] stent was placed over the wire into the ureter.  Proper curl was noted within the renal pelvis.  Proper curl within the bladder was visualized.  The patient's bladder was drained she was asked preoperatively and taken the PACU in stable condition.   Disposition: The patient returned to the PACU in stable condition.  Patient will be set up for follow-up ureteroscopy next available.

## 2024-04-12 NOTE — Transfer of Care (Signed)
 Immediate Anesthesia Transfer of Care Note  Patient: [name]  Procedure(s) Performed: CYSTOSCOPY/URETEROSCOPY/STENT PLACEMENT (Right) CYSTOSCOPY, WITH RETROGRADE PYELOGRAM (Right)  Patient Location: PACU  Anesthesia Type:General  Level of Consciousness: sedated and responds to stimulation  Airway & Oxygen Therapy: Patient Spontanous Breathing and Patient connected to nasal cannula oxygen  Post-op Assessment: Report given to RN and Post -op Vital signs reviewed and stable  Post vital signs: Reviewed and stable  Last Vitals:  Vitals Value Taken Time  BP 92/55 04/12/24 1256  Temp 36.8   Pulse 54 04/12/24 1259  Resp 15 04/12/24 1259  SpO2 100 % 04/12/24 1259  Vitals shown include unfiled device data.  Last Pain:  Vitals:   04/12/24 0950  TempSrc:   PainSc: 5       Patients Stated Pain Goal: 4 (04/12/24 0950)  Complications: No notable events documented.

## 2024-04-12 NOTE — Procedures (Signed)
 Procedure: Cystoscopy stent removal  Indications: Inability to tolerate ureteral stent  Procedure in detail after informed consent was confirmed the preoperative area patient was placed in the frog-leg position the patient was prepped and draped in the usual sterile fashion.  Next the flexible cystoscope was advanced into the bladder the stent was identified and the stent was removed.  Patient tolerated the procedure without complication.

## 2024-04-12 NOTE — Anesthesia Postprocedure Evaluation (Signed)
 Anesthesia Post Note  Patient: [name]  Procedure(s) Performed: CYSTOSCOPY/URETEROSCOPY/STENT PLACEMENT (Right) CYSTOSCOPY, WITH RETROGRADE PYELOGRAM (Right)     Patient location during evaluation: PACU Anesthesia Type: General Level of consciousness: awake Pain management: pain level controlled Vital Signs Assessment: post-procedure vital signs reviewed and stable Respiratory status: spontaneous breathing, nonlabored ventilation and respiratory function stable Cardiovascular status: blood pressure returned to baseline and stable Postop Assessment: no apparent nausea or vomiting Anesthetic complications: no   No notable events documented.  Last Vitals:  Vitals:   04/12/24 1516 04/12/24 1519  BP: 92/60 94/63  Pulse: (!) 48 (!) 47  Resp: 10 10  Temp:    SpO2: 99% 100%    Last Pain:  Vitals:   04/12/24 1516  TempSrc:   PainSc: 7                  Paulmichael Schreck P Inman Fettig

## 2024-04-12 NOTE — Discharge Instructions (Addendum)
 Discharge instructions following PCNL  Call your doctor for: Fevers greater than 100.5 Severe nausea or vomiting Increasing pain not controlled by pain medication Increasing redness or drainage from incisions Decreased urine output or a catheter is no longer draining  The number for questions is [phone].  Activity: Gradually increase activity with short frequent walks, 3-4 times a day.  Avoid strenuous activities, like sports, lawn-mowing, or heavy lifting (more than 10-15 pounds) for 3 weeks after sugery.  Wear loose, comfortable clothing that pull or kink the tube or tubes.  Do not drive while taking pain medication, or until your doctor permitts it.  Bathing and dressing changes: You should not shower for 48 hours after surgery.  Do not soak your back in a bathtub or submerge the incision in water  for 2 weeks.   Drainage bag care: You may be discharged with a drainage bag around the site of your surgery.  The drainage bag should be secured such that it never pulls or loosens to prevent it from leaking.  It is important to wash her hands before and after emptying the drainage bag to help prevent the spread of infection.  The drainage bag should be emptied as needed.  When the wound stops draining or it is manageable with a dry gauze dressing, you can remove the bag.  Diet: It is extremely important to drink plenty of fluids after surgery, especially water .  You may resume your regular diet, unless otherwise instructed.  Medications: May take Tylenol  (acetaminophen ) or ibuprofen  (Advil , Motrin ) as directed over-the-counter. Take any prescriptions as directed.  Follow-up appointments: Follow-up appointment will be scheduled with Dr. Melrose Squire office in 2-3 months for renal US 

## 2024-04-12 NOTE — Anesthesia Procedure Notes (Signed)
 Procedure Name: LMA Insertion Date/Time: 04/12/2024 11:53 AM  Performed by: Darlena Ego, CRNAPre-anesthesia Checklist: Patient identified, Emergency Drugs available, Suction available and Patient being monitored Patient Re-evaluated:Patient Re-evaluated prior to induction Oxygen Delivery Method: Circle System Utilized Preoxygenation: Pre-oxygenation with 100% oxygen Induction Type: IV induction Ventilation: Mask ventilation without difficulty LMA: LMA inserted LMA Size: 3.0 Number of attempts: 1 Airway Equipment and Method: Bite block Placement Confirmation: positive ETCO2 and breath sounds checked- equal and bilateral Tube secured with: Tape Dental Injury: Teeth and Oropharynx as per pre-operative assessment

## 2024-04-13 ENCOUNTER — Observation Stay (HOSPITAL_COMMUNITY)

## 2024-04-13 ENCOUNTER — Encounter (HOSPITAL_COMMUNITY): Payer: Self-pay | Admitting: Urology

## 2024-04-13 ENCOUNTER — Observation Stay (HOSPITAL_COMMUNITY): Admitting: Certified Registered Nurse Anesthetist

## 2024-04-13 ENCOUNTER — Encounter (HOSPITAL_COMMUNITY)
Admission: RE | Disposition: A | Discharge status: Home / Self Care | Payer: Self-pay | Source: Home / Self Care | Attending: Urology

## 2024-04-13 ENCOUNTER — Other Ambulatory Visit: Payer: Self-pay | Admitting: Urology

## 2024-04-13 DIAGNOSIS — N132 Hydronephrosis with renal and ureteral calculous obstruction: Secondary | ICD-10-CM | POA: Diagnosis not present

## 2024-04-13 HISTORY — PX: RADIOLOGY WITH ANESTHESIA: SHX6223

## 2024-04-13 HISTORY — PX: IR NEPHROSTOMY PLACEMENT RIGHT: IMG6064

## 2024-04-13 LAB — BASIC METABOLIC PANEL WITH GFR
Anion gap: 8 (ref 5–15)
BUN: 22 mg/dL — ABNORMAL HIGH (ref 6–20)
CO2: 25 mmol/L (ref 22–32)
Calcium: 8.5 mg/dL — ABNORMAL LOW (ref 8.9–10.3)
Chloride: 102 mmol/L (ref 98–111)
Creatinine, Ser: 0.89 mg/dL (ref 0.44–1.00)
GFR, Estimated: >60 mL/min (ref >60)
Glucose, Bld: 119 mg/dL — ABNORMAL HIGH (ref 70–99)
Potassium: 3.5 mmol/L (ref 3.5–5.1)
Sodium: 135 mmol/L (ref 135–145)

## 2024-04-13 LAB — CBC
HCT: 32.6 % — ABNORMAL LOW (ref 36.0–46.0)
Hemoglobin: 11.3 g/dL — ABNORMAL LOW (ref 12.0–15.0)
MCH: 30.5 pg (ref 26.0–34.0)
MCHC: 34.7 g/dL (ref 30.0–36.0)
MCV: 87.9 fL (ref 80.0–100.0)
Platelets: 330 10*3/uL (ref 150–400)
RBC: 3.71 MIL/uL — ABNORMAL LOW (ref 3.87–5.11)
RDW: 12.6 % (ref 11.5–15.5)
WBC: 6.5 10*3/uL (ref 4.0–10.5)
nRBC: 0 % (ref 0.0–0.2)

## 2024-04-13 LAB — PROTIME-INR
INR: 1.1 (ref 0.8–1.2)
Prothrombin Time: 14.4 s (ref 11.4–15.2)

## 2024-04-13 LAB — MRSA NEXT GEN BY PCR, NASAL: MRSA by PCR Next Gen: NOT DETECTED

## 2024-04-13 SURGERY — RADIOLOGY WITH ANESTHESIA
Anesthesia: General | Laterality: Right

## 2024-04-13 MED ORDER — FENTANYL CITRATE PF 50 MCG/ML IJ SOSY
25.0000 ug | PREFILLED_SYRINGE | INTRAMUSCULAR | Status: DC | PRN
  Administered 2024-04-13: 50 ug via INTRAVENOUS

## 2024-04-13 MED ORDER — MIDAZOLAM HCL 2 MG/2ML IJ SOLN
INTRAMUSCULAR | Status: DC | PRN
Start: 2024-04-13 — End: 2024-04-13
  Administered 2024-04-13 (×2): 2 mg via INTRAVENOUS

## 2024-04-13 MED ORDER — ONDANSETRON HCL 4 MG/2ML IJ SOLN
INTRAMUSCULAR | Status: DC | PRN
  Administered 2024-04-13: 4 mg via INTRAVENOUS

## 2024-04-13 MED ORDER — FENTANYL CITRATE PF 50 MCG/ML IJ SOSY
PREFILLED_SYRINGE | INTRAMUSCULAR | Status: AC
  Filled 2024-04-13: qty 3

## 2024-04-13 MED ORDER — PHENYLEPHRINE HCL-NACL 20-0.9 MG/250ML-% IV SOLN
0.0000 ug/min | INTRAVENOUS | Status: DC
  Filled 2024-04-13: qty 250

## 2024-04-13 MED ORDER — IOHEXOL 300 MG/ML  SOLN: 50.0000 mL | Freq: Once | INTRAMUSCULAR | Status: DC | PRN

## 2024-04-13 MED ORDER — SODIUM CHLORIDE 0.9% FLUSH: 10.0000 mL | INTRAVENOUS | Status: DC | PRN

## 2024-04-13 MED ORDER — SUGAMMADEX SODIUM 200 MG/2ML IV SOLN
INTRAVENOUS | Status: DC | PRN
  Administered 2024-04-13: 200 mg via INTRAVENOUS

## 2024-04-13 MED ORDER — FENTANYL CITRATE (PF) 100 MCG/2ML IJ SOLN
INTRAMUSCULAR | Status: AC
  Filled 2024-04-13: qty 2

## 2024-04-13 MED ORDER — LIDOCAINE-EPINEPHRINE 1 %-1:100000 IJ SOLN
INTRAMUSCULAR | Status: AC
  Filled 2024-04-13: qty 1

## 2024-04-13 MED ORDER — FENTANYL CITRATE (PF) 100 MCG/2ML IJ SOLN
INTRAMUSCULAR | Status: DC | PRN
  Administered 2024-04-13 (×2): 50 ug via INTRAVENOUS

## 2024-04-13 MED ORDER — FENTANYL CITRATE PF 50 MCG/ML IJ SOSY
25.0000 ug | PREFILLED_SYRINGE | Freq: Once | INTRAMUSCULAR | Status: AC
Start: 2024-04-13 — End: 2024-04-13

## 2024-04-13 MED ORDER — SODIUM CHLORIDE 0.9 % IV SOLN
1.0000 g | Freq: Once | INTRAVENOUS | Status: AC
  Administered 2024-04-13: 1 g via INTRAVENOUS
  Filled 2024-04-13 (×2): qty 1000

## 2024-04-13 MED ORDER — OXYCODONE HCL 5 MG/5ML PO SOLN: 5.0000 mg | Freq: Once | ORAL | Status: DC | PRN

## 2024-04-13 MED ORDER — CHLORHEXIDINE GLUCONATE 0.12 % MT SOLN
15.0000 mL | Freq: Once | OROMUCOSAL | Status: AC
  Administered 2024-04-13: 15 mL via OROMUCOSAL

## 2024-04-13 MED ORDER — TRANEXAMIC ACID-NACL 1000-0.7 MG/100ML-% IV SOLN
1000.0000 mg | INTRAVENOUS | Status: AC
  Administered 2024-04-14: 1000 mg via INTRAVENOUS
  Filled 2024-04-13: qty 100

## 2024-04-13 MED ORDER — DIPHENHYDRAMINE HCL 50 MG/ML IJ SOLN
INTRAMUSCULAR | Status: AC
  Filled 2024-04-13: qty 1

## 2024-04-13 MED ORDER — FENTANYL CITRATE PF 50 MCG/ML IJ SOSY
PREFILLED_SYRINGE | INTRAMUSCULAR | Status: AC
  Administered 2024-04-13: 25 ug via INTRAVENOUS
  Filled 2024-04-13: qty 1

## 2024-04-13 MED ORDER — OXYCODONE HCL 5 MG PO TABS: 5.0000 mg | ORAL_TABLET | Freq: Once | ORAL | Status: DC | PRN

## 2024-04-13 MED ORDER — MIDAZOLAM HCL 2 MG/2ML IJ SOLN
INTRAMUSCULAR | Status: AC
  Filled 2024-04-13: qty 2

## 2024-04-13 MED ORDER — DEXMEDETOMIDINE HCL IN NACL 80 MCG/20ML IV SOLN
INTRAVENOUS | Status: AC
  Filled 2024-04-13: qty 20

## 2024-04-13 MED ORDER — CHLORHEXIDINE GLUCONATE CLOTH 2 % EX PADS
6.0000 | MEDICATED_PAD | Freq: Every day | CUTANEOUS | Status: DC
  Administered 2024-04-14 – 2024-04-15 (×2): 6 via TOPICAL

## 2024-04-13 MED ORDER — ROCURONIUM BROMIDE 10 MG/ML (PF) SYRINGE
PREFILLED_SYRINGE | INTRAVENOUS | Status: DC | PRN
  Administered 2024-04-13: 50 mg via INTRAVENOUS

## 2024-04-13 MED ORDER — CHLORHEXIDINE GLUCONATE 0.12 % MT SOLN: 15.0000 mL | Freq: Once | OROMUCOSAL | Status: DC

## 2024-04-13 MED ORDER — PREDNISONE 50 MG PO TABS
50.0000 mg | ORAL_TABLET | Freq: Four times a day (QID) | ORAL | Status: DC
  Filled 2024-04-13: qty 1

## 2024-04-13 MED ORDER — LACTATED RINGERS IV BOLUS
500.0000 mL | Freq: Once | INTRAVENOUS | Status: AC
  Administered 2024-04-13: 500 mL via INTRAVENOUS

## 2024-04-13 MED ORDER — PROPOFOL 10 MG/ML IV BOLUS
INTRAVENOUS | Status: DC | PRN
  Administered 2024-04-13: 100 mg via INTRAVENOUS

## 2024-04-13 MED ORDER — AMISULPRIDE (ANTIEMETIC) 5 MG/2ML IV SOLN: 10.0000 mg | Freq: Once | INTRAVENOUS | Status: DC | PRN

## 2024-04-13 MED ORDER — SODIUM CHLORIDE 0.9 % IV SOLN: INTRAVENOUS | Status: DC | PRN

## 2024-04-13 MED ORDER — LIDOCAINE 2% (20 MG/ML) 5 ML SYRINGE
INTRAMUSCULAR | Status: DC | PRN
  Administered 2024-04-13: 50 mg via INTRAVENOUS

## 2024-04-13 MED ORDER — ACETAMINOPHEN 10 MG/ML IV SOLN: 650.0000 mg | Freq: Once | INTRAVENOUS | Status: DC | PRN

## 2024-04-13 MED ORDER — DEXAMETHASONE SODIUM PHOSPHATE 10 MG/ML IJ SOLN
INTRAMUSCULAR | Status: DC | PRN
  Administered 2024-04-13: 10 mg via INTRAVENOUS

## 2024-04-13 MED ORDER — LIDOCAINE-EPINEPHRINE 1 %-1:100000 IJ SOLN
20.0000 mL | Freq: Once | INTRAMUSCULAR | Status: AC
  Filled 2024-04-13: qty 20

## 2024-04-13 MED ORDER — CHLORHEXIDINE GLUCONATE CLOTH 2 % EX PADS
6.0000 | MEDICATED_PAD | Freq: Every day | CUTANEOUS | Status: DC
  Administered 2024-04-13: 6 via TOPICAL

## 2024-04-13 MED ORDER — OXYCODONE HCL 5 MG PO TABS
ORAL_TABLET | ORAL | Status: AC
  Administered 2024-04-13: 5 mg
  Filled 2024-04-13: qty 1

## 2024-04-13 MED ORDER — PROPOFOL 500 MG/50ML IV EMUL
INTRAVENOUS | Status: DC | PRN
  Administered 2024-04-13: 150 ug/kg/min via INTRAVENOUS

## 2024-04-13 NOTE — Progress Notes (Signed)
 Pt found with pill bottles on bedside table. This RN educated on importance of only taking medication given by hospital staff. Pt claimed that someone in Pharmacy told her she could keep her home medications at the bedside and take when needed with the nurse in the room. Pt educated on hospital policies regarding home medications. This RN requested pt send medications home with mother or send to pharmacy. Pt and family agreed to send them home. This RN returned to room to give medications before IR procedure at 1018. Pt informed this RN that she took her medications at approximately 0915 with her mother. Pt reeducated on need to take only medications given by hospital staff. Pt states they do not like sending the expensive medication down to pharmacy due to lose of medication previously. Spoke with Pharmacy staff who approves the use and dispense of fycompa  from the bedside. Pharmacy requests other meds be sent home due to mistrust with pharmacy keeping home medications. Pt reeducated on need to send medications home. Pt agreed. Medications still present at bedside.

## 2024-04-13 NOTE — Sedation Documentation (Signed)
 Anesthesia in to sedate and monitor.
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
## 2024-04-13 NOTE — Progress Notes (Signed)
 1 Day Post-Op Subjective: No N/V/F/C. Still having pain in urethra and bladder. Has  had low BP no other signs of infection she is typing on her lap top this morning and converses normally. She state BP is normally very low   Objective: Vital signs in last 24 hours: Temp:  [97.5 F (36.4 C)-98.6 F (37 C)] 98.6 F (37 C) (04/30 0459) Pulse Rate:  [44-100] 64 (04/30 0603) Resp:  [10-20] 16 (04/30 0459) BP: (83-150)/(44-109) 83/45 (04/30 0603) SpO2:  [86 %-100 %] 98 % (04/30 0602) Weight:  [43.5 kg] 43.5 kg (04/29 0950)  Intake/Output from previous day: 04/29 0701 - 04/30 0700 In: 449.3 [P.O.:200; IV Piggyback:249.3] Out: 50 [Urine:50] Intake/Output this shift: Total I/O In: 449.3 [P.O.:200; IV Piggyback:249.3] Out: 50 [Urine:50]  Physical Exam:  General: Alert and oriented CV: RRR Lungs: Clear GU: no catheter   Lab Results: Recent Labs    04/13/24 0354  HGB 11.3*  HCT 32.6*   BMET Recent Labs    04/13/24 0354  NA 135  K 3.5  CL 102  CO2 25  GLUCOSE 119*  BUN 22*  CREATININE 0.89  CALCIUM 8.5*     Studies/Results: DG C-Arm 1-60 Min-No Report Result Date: 04/12/2024 Fluoroscopy was utilized by the requesting physician.  No radiographic interpretation.    Assessment/Plan: 75 F w/ R renal pelvis stone narrow ureter unable to accommodate a scope and unable to tolerate stent. Planning for PCNL on Thursday.   #Stone: - NPO this AM - plan for R perc tube with IR - PCNL on Thrusday    # Contrast allergy - pre contrast medications ordered  # Hx of seizures - all anit seizure medication ordered   # Pain  - multimodal pain regimen ordered  # Low BP - this is base line per patient  - she appears stable and does not need BP support at this time    LOS: 1 day   Aimee Houseman MD 04/13/2024, 6:47 AM [facility]

## 2024-04-13 NOTE — Anesthesia Preprocedure Evaluation (Addendum)
 Anesthesia Evaluation  Patient identified by MRN, date of birth, ID band Patient awake    Reviewed: Allergy & Precautions, NPO status , Patient's Chart, lab work & pertinent test results  History of Anesthesia Complications (+) PONV and history of anesthetic complications  Airway Mallampati: II  TM Distance: >3 FB Neck ROM: Full    Dental no notable dental hx.    Pulmonary asthma    Pulmonary exam normal        Cardiovascular negative cardio ROS Normal cardiovascular exam     Neuro/Psych  Headaches, Seizures -, Well Controlled,  PSYCHIATRIC DISORDERS Anxiety     PTSD (post-traumatic stress disorder)  Neuromuscular disease CVA, No Residual Symptoms    GI/Hepatic negative GI ROS, Neg liver ROS,,,  Endo/Other  negative endocrine ROS    Renal/GU negative Renal ROS     Musculoskeletal negative musculoskeletal ROS (+)    Abdominal   Peds  Hematology  (+) Blood dyscrasia, anemia   Anesthesia Other Findings RIGHT URETERAL STONE  Reproductive/Obstetrics Hcg negative                             Anesthesia Physical Anesthesia Plan  ASA: 2  Anesthesia Plan: General   Post-op Pain Management: Precedex    Induction: Intravenous  PONV Risk Score and Plan: 4 or greater and Ondansetron , Dexamethasone , Midazolam , Treatment may vary due to age or medical condition and Propofol  infusion  Airway Management Planned: Oral ETT  Additional Equipment:   Intra-op Plan:   Post-operative Plan: Extubation in OR  Informed Consent: I have reviewed the patients History and Physical, chart, labs and discussed the procedure including the risks, benefits and alternatives for the proposed anesthesia with the patient or authorized representative who has indicated his/her understanding and acceptance.     Dental advisory given  Plan Discussed with: CRNA  Anesthesia Plan Comments: (PAT note 02/24/2024)        Anesthesia Quick Evaluation

## 2024-04-13 NOTE — Transfer of Care (Signed)
 Immediate Anesthesia Transfer of Care Note  Patient: [name]  Procedure(s) Performed: RADIOLOGY WITH ANESTHESIA (Right)  Patient Location: PACU  Anesthesia Type:General  Level of Consciousness: awake, alert , and oriented  Airway & Oxygen Therapy: Patient Spontanous Breathing and Patient connected to face mask oxygen  Post-op Assessment: Report given to RN and Post -op Vital signs reviewed and stable  Post vital signs: Reviewed and stable  Last Vitals:  Vitals Value Taken Time  BP 113/69 04/13/24 1432  Temp    Pulse 47 04/13/24 1436  Resp 21 04/13/24 1436  SpO2 100 % 04/13/24 1436  Vitals shown include unfiled device data.  Last Pain:  Vitals:   04/13/24 1215  TempSrc:   PainSc: 7       Patients Stated Pain Goal: 4 (04/12/24 0950)  Complications: No notable events documented.

## 2024-04-13 NOTE — Progress Notes (Signed)
 Patient received Perc neph tube today. Will plan for PCNL tomorrow. Discussed risks benefits and alternatives to procedure including bleeding infections, damage to ureter and kidney, possible need for blood and transfusion, possible need for embolization. Need for nephrostomy tube and catheter post procedure. Possible need for stent. Patient voiced her understanding and wants to proceed.

## 2024-04-13 NOTE — TOC Initial Note (Signed)
 Transition of Care [facility]) - Initial/Assessment Note    Patient Details  Name: [name] MRN: [id number] Date of Birth: 31-Jan-1991  Transition [facility]) CM/SW Contact:    Ruben Corolla, RN Phone Number: 04/13/2024, 4:00 PM  Clinical Narrative:   d/c plan home.                Expected Discharge Plan: Home/Self Care Barriers to Discharge: Continued Medical Work up   Patient Goals and CMS Choice Patient states their goals for this hospitalization and ongoing recovery are:: Home          Expected Discharge Plan and Services         Expected Discharge Date: 04/12/24                                    Prior Living Arrangements/Services                       Activities of Daily Living   ADL Screening (condition at time of admission) Independently performs ADLs?: Yes (appropriate for developmental age) Is the patient deaf or have difficulty hearing?: Yes Does the patient have difficulty seeing, even when wearing glasses/contacts?: No Does the patient have difficulty concentrating, remembering, or making decisions?: No  Permission Sought/Granted                  Emotional Assessment              Admission diagnosis:  Calculus of ureter [N20.1] Ureteral stone [N20.1] Patient Active Problem List   Diagnosis Date Noted   Ureteral stone 04/12/2024   Cryptogenic stroke (HCC) 10/31/2022   Sinus bradycardia 10/13/2022   Prolonged Q-T interval on ECG 10/13/2022   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   BRCA2 gene mutation positive in female 03/08/2022   S/P bilateral mastectomy 02/28/2022   Breast cancer (HCC) 02/25/2022   Port-A-Cath in place 12/03/2021   Dysmenorrhea 03/12/2021   Problem with vascular access 02/16/2020   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   Pneumomediastinum (HCC) 03/17/2019   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   Syncope 05/21/2018   Cellulitis of chest wall 10/07/2017   Infected venous  access port, initial encounter 10/07/2017   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   Partial idiopathic epilepsy with seizures of localized onset, intractable, without status epilepticus (HCC) 07/12/2015   Hypoglycemia 06/12/2014   Migraine headache 06/29/2013   Asthma 11/27/2011   Muscle weakness of lower extremity 11/26/2011   Acute appendicitis with localized peritonitis 11/26/2011   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   Neuropathy, 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   HEARING LOSS, BILATERAL 05/25/2008   PCP:  Alto Atta, NP Pharmacy:   [id number] - [place], [place] - [address] AT [facility] [address] [place] [address] Phone: [phone] Fax: [phone]     Social Drivers of Health (SDOH) Social History: SDOH Screenings   Food Insecurity: No Food Insecurity (04/12/2024)  Housing: Low Risk  (04/12/2024)  Transportation Needs: No Transportation Needs (04/12/2024)  Utilities: Not At Risk (04/12/2024)  Depression (PHQ2-9): Low Risk  (02/17/2024)  Financial Resource Strain: Low Risk  (07/29/2022)   Received from [facility], [facility]  Physical Activity: Sufficiently Active (07/29/2022)   Received from [facility], [facility]  Social Connections: Unknown (07/30/2022)   Received from [facility]  Stress: Stress Concern Present (07/29/2022)   Received from [facility], [facility]  Tobacco Use: Low Risk  (04/13/2024)   SDOH Interventions:     Readmission Risk Interventions     No data to display

## 2024-04-13 NOTE — Anesthesia Procedure Notes (Signed)
 Procedure Name: Intubation Date/Time: 04/13/2024 1:08 PM  Performed by: Joyice Nodal, CRNAPre-anesthesia Checklist: Patient identified, Emergency Drugs available, Suction available and Patient being monitored Patient Re-evaluated:Patient Re-evaluated prior to induction Oxygen Delivery Method: Circle system utilized Preoxygenation: Pre-oxygenation with 100% oxygen Induction Type: IV induction Ventilation: Mask ventilation without difficulty Laryngoscope Size: Miller and 2 Grade View: Grade I Tube type: Oral Tube size: 7.0 mm Number of attempts: 1 Airway Equipment and Method: Stylet Placement Confirmation: ETT inserted through vocal cords under direct vision, positive ETCO2 and breath sounds checked- equal and bilateral Secured at: 20 cm Tube secured with: Tape Dental Injury: Teeth and Oropharynx as per pre-operative assessment

## 2024-04-13 NOTE — Procedures (Signed)
 Interventional Radiology Procedure:   Indications: Right renal pelvic stone and needs percutaneous access for nephrolithotomy procedure.   Procedure: Placement of right nephroureteral catheter  Findings: Mild dilatation of right renal collecting system.  Non obstructive stone in right renal pelvis.  Access obtained from a lower pole calyx and 4 Fr catheter advanced to the bladder.  Catheter secured to the skin with suture.   Complications: None     EBL: Minimal  Plan: Keep nephroureteral catheter capped until surgery tomorrow.   Alexandrea Westergard R. Julietta Ogren, MD  Pager: [phone]

## 2024-04-14 ENCOUNTER — Observation Stay (HOSPITAL_COMMUNITY): Admitting: Anesthesiology

## 2024-04-14 ENCOUNTER — Encounter (HOSPITAL_COMMUNITY): Payer: Self-pay | Admitting: Interventional Radiology

## 2024-04-14 ENCOUNTER — Encounter (HOSPITAL_COMMUNITY)
Admission: RE | Disposition: A | Discharge status: Home / Self Care | Payer: Self-pay | Source: Home / Self Care | Attending: Urology

## 2024-04-14 ENCOUNTER — Observation Stay (HOSPITAL_COMMUNITY)

## 2024-04-14 DIAGNOSIS — N179 Acute kidney failure, unspecified: Secondary | ICD-10-CM | POA: Diagnosis present

## 2024-04-14 DIAGNOSIS — Z91041 Radiographic dye allergy status: Secondary | ICD-10-CM | POA: Diagnosis not present

## 2024-04-14 DIAGNOSIS — N201 Calculus of ureter: Secondary | ICD-10-CM | POA: Diagnosis present

## 2024-04-14 DIAGNOSIS — J45909 Unspecified asthma, uncomplicated: Secondary | ICD-10-CM | POA: Diagnosis present

## 2024-04-14 DIAGNOSIS — E8842 MERRF syndrome: Secondary | ICD-10-CM | POA: Diagnosis present

## 2024-04-14 DIAGNOSIS — Z881 Allergy status to other antibiotic agents status: Secondary | ICD-10-CM | POA: Diagnosis not present

## 2024-04-14 DIAGNOSIS — D8481 Immunodeficiency due to conditions classified elsewhere: Secondary | ICD-10-CM | POA: Diagnosis present

## 2024-04-14 DIAGNOSIS — N131 Hydronephrosis with ureteral stricture, not elsewhere classified: Secondary | ICD-10-CM | POA: Diagnosis present

## 2024-04-14 DIAGNOSIS — N202 Calculus of kidney with calculus of ureter: Secondary | ICD-10-CM | POA: Diagnosis present

## 2024-04-14 DIAGNOSIS — Z880 Allergy status to penicillin: Secondary | ICD-10-CM | POA: Diagnosis not present

## 2024-04-14 DIAGNOSIS — G40209 Localization-related (focal) (partial) symptomatic epilepsy and epileptic syndromes with complex partial seizures, not intractable, without status epilepticus: Secondary | ICD-10-CM | POA: Diagnosis present

## 2024-04-14 HISTORY — PX: NEPHROLITHOTOMY: SHX5134

## 2024-04-14 LAB — BASIC METABOLIC PANEL WITH GFR
Anion gap: 8 (ref 5–15)
Anion gap: 9 (ref 5–15)
BUN: 28 mg/dL — ABNORMAL HIGH (ref 6–20)
BUN: 20 mg/dL (ref 6–20)
CO2: 25 mmol/L (ref 22–32)
CO2: 20 mmol/L — ABNORMAL LOW (ref 22–32)
Calcium: 8.4 mg/dL — ABNORMAL LOW (ref 8.9–10.3)
Calcium: 7.6 mg/dL — ABNORMAL LOW (ref 8.9–10.3)
Chloride: 101 mmol/L (ref 98–111)
Chloride: 109 mmol/L (ref 98–111)
Creatinine, Ser: 1.17 mg/dL — ABNORMAL HIGH (ref 0.44–1.00)
Creatinine, Ser: 0.8 mg/dL (ref 0.44–1.00)
GFR, Estimated: >60 mL/min (ref >60)
GFR, Estimated: >60 mL/min (ref >60)
Glucose, Bld: 150 mg/dL — ABNORMAL HIGH (ref 70–99)
Glucose, Bld: 131 mg/dL — ABNORMAL HIGH (ref 70–99)
Potassium: 3.7 mmol/L (ref 3.5–5.1)
Potassium: 3.7 mmol/L (ref 3.5–5.1)
Sodium: 134 mmol/L — ABNORMAL LOW (ref 135–145)
Sodium: 138 mmol/L (ref 135–145)

## 2024-04-14 LAB — CBC
HCT: 30.8 % — ABNORMAL LOW (ref 36.0–46.0)
HCT: 30.9 % — ABNORMAL LOW (ref 36.0–46.0)
Hemoglobin: 10.2 g/dL — ABNORMAL LOW (ref 12.0–15.0)
Hemoglobin: 9.5 g/dL — ABNORMAL LOW (ref 12.0–15.0)
MCH: 30.3 pg (ref 26.0–34.0)
MCH: 29.6 pg (ref 26.0–34.0)
MCHC: 33.1 g/dL (ref 30.0–36.0)
MCHC: 30.7 g/dL (ref 30.0–36.0)
MCV: 91.4 fL (ref 80.0–100.0)
MCV: 96.3 fL (ref 80.0–100.0)
Platelets: 289 10*3/uL (ref 150–400)
Platelets: 267 10*3/uL (ref 150–400)
RBC: 3.37 MIL/uL — ABNORMAL LOW (ref 3.87–5.11)
RBC: 3.21 MIL/uL — ABNORMAL LOW (ref 3.87–5.11)
RDW: 12.8 % (ref 11.5–15.5)
RDW: 12.8 % (ref 11.5–15.5)
WBC: 12.8 10*3/uL — ABNORMAL HIGH (ref 4.0–10.5)
WBC: 11.5 10*3/uL — ABNORMAL HIGH (ref 4.0–10.5)
nRBC: 0 % (ref 0.0–0.2)
nRBC: 0 % (ref 0.0–0.2)

## 2024-04-14 LAB — TYPE AND SCREEN
ABO/RH(D): O POS
Antibody Screen: NEGATIVE

## 2024-04-14 SURGERY — NEPHROLITHOTOMY PERCUTANEOUS
Anesthesia: General | Site: Back | Laterality: Right

## 2024-04-14 MED ORDER — ROCURONIUM BROMIDE 100 MG/10ML IV SOLN
INTRAVENOUS | Status: DC | PRN
  Administered 2024-04-14 (×2): 10 mg via INTRAVENOUS
  Administered 2024-04-14: 40 mg via INTRAVENOUS

## 2024-04-14 MED ORDER — SODIUM CHLORIDE 0.9 % IR SOLN
Status: DC | PRN
  Administered 2024-04-14 (×4): 3000 mL via INTRAVESICAL
  Administered 2024-04-14: 6000 mL via INTRAVESICAL

## 2024-04-14 MED ORDER — HYDROMORPHONE HCL 1 MG/ML IJ SOLN
INTRAMUSCULAR | Status: AC
  Filled 2024-04-14: qty 2

## 2024-04-14 MED ORDER — HYDROMORPHONE HCL 1 MG/ML IJ SOLN
0.2500 mg | INTRAMUSCULAR | Status: DC | PRN
  Administered 2024-04-14 (×4): 0.5 mg via INTRAVENOUS

## 2024-04-14 MED ORDER — MIDAZOLAM HCL 2 MG/2ML IJ SOLN
INTRAMUSCULAR | Status: AC
  Filled 2024-04-14: qty 2

## 2024-04-14 MED ORDER — HYDROMORPHONE HCL 1 MG/ML IJ SOLN
INTRAMUSCULAR | Status: DC | PRN
  Administered 2024-04-14 (×2): .4 mg via INTRAVENOUS

## 2024-04-14 MED ORDER — CLINDAMYCIN PHOSPHATE 600 MG/50ML IV SOLN
600.0000 mg | Freq: Once | INTRAVENOUS | Status: AC
  Filled 2024-04-14: qty 50

## 2024-04-14 MED ORDER — FENTANYL CITRATE PF 50 MCG/ML IJ SOSY
50.0000 ug | PREFILLED_SYRINGE | Freq: Once | INTRAMUSCULAR | Status: AC
  Administered 2024-04-14: 50 ug via INTRAVENOUS
  Filled 2024-04-14: qty 1

## 2024-04-14 MED ORDER — HYDROMORPHONE HCL 2 MG/ML IJ SOLN
INTRAMUSCULAR | Status: AC
  Filled 2024-04-14: qty 1

## 2024-04-14 MED ORDER — GENTAMICIN SULFATE 40 MG/ML IJ SOLN
5.0000 mg/kg | INTRAVENOUS | Status: AC
  Administered 2024-04-14: 220 mg via INTRAVENOUS
  Filled 2024-04-14: qty 5.5

## 2024-04-14 MED ORDER — GENTAMICIN SULFATE 40 MG/ML IJ SOLN
5.0000 mg/kg | Freq: Once | INTRAVENOUS | Status: AC
  Filled 2024-04-14: qty 5.5

## 2024-04-14 MED ORDER — HYOSCYAMINE SULFATE 0.125 MG SL SUBL
SUBLINGUAL_TABLET | SUBLINGUAL | Status: AC
  Filled 2024-04-14: qty 1

## 2024-04-14 MED ORDER — PROPOFOL 10 MG/ML IV BOLUS
INTRAVENOUS | Status: DC | PRN
  Administered 2024-04-14: 100 mg via INTRAVENOUS
  Administered 2024-04-14: 200 ug/kg/min via INTRAVENOUS

## 2024-04-14 MED ORDER — METHYLPREDNISOLONE SODIUM SUCC 40 MG IJ SOLR
40.0000 mg | Freq: Four times a day (QID) | INTRAMUSCULAR | Status: AC
Start: 2024-04-14 — End: 2024-04-14
  Administered 2024-04-14 (×2): 40 mg via INTRAVENOUS
  Filled 2024-04-14 (×2): qty 1

## 2024-04-14 MED ORDER — FENTANYL CITRATE (PF) 100 MCG/2ML IJ SOLN
INTRAMUSCULAR | Status: DC | PRN
  Administered 2024-04-14: 100 ug via INTRAVENOUS

## 2024-04-14 MED ORDER — MIDAZOLAM HCL 5 MG/5ML IJ SOLN
INTRAMUSCULAR | Status: DC | PRN
  Administered 2024-04-14: 4 mg via INTRAVENOUS
  Administered 2024-04-14: 2 mg via INTRAVENOUS

## 2024-04-14 MED ORDER — CHLORHEXIDINE GLUCONATE 0.12 % MT SOLN
15.0000 mL | Freq: Once | OROMUCOSAL | Status: AC
  Administered 2024-04-14: 15 mL via OROMUCOSAL

## 2024-04-14 MED ORDER — BUPIVACAINE-EPINEPHRINE (PF) 0.25% -1:200000 IJ SOLN
INTRAMUSCULAR | Status: DC | PRN
  Administered 2024-04-14: 20 mL

## 2024-04-14 MED ORDER — PROPOFOL 1000 MG/100ML IV EMUL
INTRAVENOUS | Status: AC
  Filled 2024-04-14: qty 100

## 2024-04-14 MED ORDER — DEXAMETHASONE SODIUM PHOSPHATE 10 MG/ML IJ SOLN
INTRAMUSCULAR | Status: AC
  Filled 2024-04-14: qty 1

## 2024-04-14 MED ORDER — LIDOCAINE HCL (PF) 2 % IJ SOLN
INTRAMUSCULAR | Status: AC
  Filled 2024-04-14: qty 5

## 2024-04-14 MED ORDER — LIDOCAINE HCL (CARDIAC) PF 100 MG/5ML IV SOSY
PREFILLED_SYRINGE | INTRAVENOUS | Status: DC | PRN
  Administered 2024-04-14: 40 mg via INTRAVENOUS

## 2024-04-14 MED ORDER — FENTANYL CITRATE (PF) 100 MCG/2ML IJ SOLN
INTRAMUSCULAR | Status: AC
  Filled 2024-04-14: qty 2

## 2024-04-14 MED ORDER — HYOSCYAMINE SULFATE 0.125 MG SL SUBL
SUBLINGUAL_TABLET | SUBLINGUAL | Status: AC
Start: 2024-04-14 — End: 2024-04-14
  Filled 2024-04-14: qty 1

## 2024-04-14 MED ORDER — IOHEXOL 300 MG/ML  SOLN
INTRAMUSCULAR | Status: DC | PRN
Start: 2024-04-14 — End: 2024-04-14
  Administered 2024-04-14: 100 mL

## 2024-04-14 MED ORDER — OXYCODONE HCL 5 MG PO TABS: 5.0000 mg | ORAL_TABLET | Freq: Once | ORAL | Status: AC | PRN

## 2024-04-14 MED ORDER — SUGAMMADEX SODIUM 200 MG/2ML IV SOLN
INTRAVENOUS | Status: DC | PRN
Start: 2024-04-14 — End: 2024-04-14
  Administered 2024-04-14: 200 mg via INTRAVENOUS

## 2024-04-14 MED ORDER — 0.9 % SODIUM CHLORIDE (POUR BTL) OPTIME
TOPICAL | Status: DC | PRN
  Administered 2024-04-14: 1000 mL

## 2024-04-14 MED ORDER — SODIUM CHLORIDE 0.9 % IV SOLN: INTRAVENOUS | Status: AC

## 2024-04-14 MED ORDER — AMISULPRIDE (ANTIEMETIC) 5 MG/2ML IV SOLN: 10.0000 mg | Freq: Once | INTRAVENOUS | Status: DC | PRN

## 2024-04-14 MED ORDER — CLINDAMYCIN PHOSPHATE 600 MG/50ML IV SOLN
600.0000 mg | INTRAVENOUS | Status: AC
  Administered 2024-04-14: 600 mg via INTRAVENOUS
  Filled 2024-04-14: qty 50

## 2024-04-14 MED ORDER — ONDANSETRON HCL 4 MG/2ML IJ SOLN
INTRAMUSCULAR | Status: AC
  Filled 2024-04-14: qty 2

## 2024-04-14 MED ORDER — BUPIVACAINE-EPINEPHRINE (PF) 0.25% -1:200000 IJ SOLN
INTRAMUSCULAR | Status: AC
  Filled 2024-04-14: qty 30

## 2024-04-14 MED ORDER — PROPOFOL 10 MG/ML IV BOLUS
INTRAVENOUS | Status: AC
Start: 2024-04-14 — End: ?
  Filled 2024-04-14: qty 20

## 2024-04-14 MED ORDER — OXYCODONE HCL 5 MG/5ML PO SOLN
5.0000 mg | Freq: Once | ORAL | Status: AC | PRN
  Administered 2024-04-14: 5 mg via ORAL

## 2024-04-14 MED ORDER — OXYCODONE HCL 5 MG/5ML PO SOLN
ORAL | Status: AC
  Filled 2024-04-14: qty 5

## 2024-04-14 SURGICAL SUPPLY — 48 items
BAG DRAINAGE 600ML DEPOT (BAG) IMPLANT
BAG URINE DRAIN 2000ML AR STRL (UROLOGICAL SUPPLIES) IMPLANT
BASKET STONE NCOMPASS (UROLOGICAL SUPPLIES) IMPLANT
BASKET ZERO TIP NITINOL 2.4FR (BASKET) IMPLANT
BLADE SURG 15 STRL LF DISP TIS (BLADE) ×1 IMPLANT
BOOTIES KNEE HIGH SLOAN (MISCELLANEOUS) IMPLANT
CATH STENT KAYE NEPHR TAMP (CATHETERS) IMPLANT
CATH ULTRATHANE 14FR (CATHETERS) IMPLANT
CATH URETERAL DUAL LUMEN 10F (MISCELLANEOUS) ×1 IMPLANT
CATH URETL OPEN 5X70 (CATHETERS) IMPLANT
CATH UROLOGY TORQUE 65 (CATHETERS) IMPLANT
CATH X-FORCE N30 NEPHROSTOMY (TUBING) ×1 IMPLANT
CHLORAPREP W/TINT 26 (MISCELLANEOUS) ×1 IMPLANT
DRAPE C-ARM 42X120 X-RAY (DRAPES) ×1 IMPLANT
DRAPE LINGEMAN PERC (DRAPES) ×1 IMPLANT
DRAPE SURG IRRIG POUCH 19X23 (DRAPES) ×2 IMPLANT
EXTRACTOR STONE 1.7FRX115CM (UROLOGICAL SUPPLIES) IMPLANT
EXTRACTOR STONE NITINOL NGAGE (UROLOGICAL SUPPLIES) IMPLANT
GAUZE PAD ABD 8X10 STRL (GAUZE/BANDAGES/DRESSINGS) IMPLANT
GAUZE SPONGE 4X4 12PLY STRL (GAUZE/BANDAGES/DRESSINGS) IMPLANT
GOWN STRL REUS W/ TWL XL LVL3 (GOWN DISPOSABLE) ×1 IMPLANT
GUIDEWIRE AMPLAZ .035X145 (WIRE) ×1 IMPLANT
GUIDEWIRE ANG ZIPWIRE 038X150 (WIRE) IMPLANT
GUIDEWIRE STR DUAL SENSOR (WIRE) ×1 IMPLANT
KIT BASIN OR (CUSTOM PROCEDURE TRAY) ×1 IMPLANT
KIT PROBE 340X3.4XDISP GRN (MISCELLANEOUS) IMPLANT
KIT PROBE TRILOGY 3.9X350 (MISCELLANEOUS) IMPLANT
KIT TURNOVER KIT A (KITS) IMPLANT
LASER FIB FLEXIVA PULSE ID 365 (Laser) IMPLANT
MANIFOLD NEPTUNE II (INSTRUMENTS) ×1 IMPLANT
MAT ABSORB FLUID 56X50 GRAY (MISCELLANEOUS) ×1 IMPLANT
NS IRRIG 1000ML POUR BTL (IV SOLUTION) ×1 IMPLANT
PACK CYSTO (CUSTOM PROCEDURE TRAY) ×1 IMPLANT
PROTECTOR NERVE ULNAR (MISCELLANEOUS) ×3 IMPLANT
SHEATH DILATOR SET 8/10 (MISCELLANEOUS) IMPLANT
SHEATH NAVIGATOR HD 11/13X28 (SHEATH) IMPLANT
SPONGE T-LAP 4X18 ~~LOC~~+RFID (SPONGE) ×1 IMPLANT
SUT CHROMIC 3 0 SH 27 (SUTURE) IMPLANT
SUT ETHILON 3 0 PS 1 (SUTURE) IMPLANT
SYR 20ML LL LF (SYRINGE) ×1 IMPLANT
SYR 50ML LL SCALE MARK (SYRINGE) ×1 IMPLANT
TAPE CLOTH SURG 4X10 WHT LF (GAUZE/BANDAGES/DRESSINGS) IMPLANT
TOWEL OR 17X26 10 PK STRL BLUE (TOWEL DISPOSABLE) ×1 IMPLANT
TRACTIP FLEXIVA PULS ID 200XHI (Laser) IMPLANT
TUBE CONNECTING VINYL 14FR 30C (TUBING) IMPLANT
TUBING CONNECTING 10 (TUBING) ×1 IMPLANT
TUBING STONE CATCHER TRILOGY (MISCELLANEOUS) IMPLANT
TUBING UROLOGY SET (TUBING) ×1 IMPLANT

## 2024-04-14 NOTE — Transfer of Care (Signed)
 Immediate Anesthesia Transfer of Care Note  Patient: [name]  Procedure(s) Performed: NEPHROLITHOTOMY PERCUTANEOUS (Right: Back)  Patient Location: PACU  Anesthesia Type:General  Level of Consciousness: awake and drowsy  Airway & Oxygen Therapy: Patient Spontanous Breathing and Patient connected to face mask oxygen  Post-op Assessment: Report given to RN and Post -op Vital signs reviewed and stable  Post vital signs: Reviewed and stable  Last Vitals:  Vitals Value Taken Time  BP 111/64 04/14/24 1539  Temp    Pulse 44 04/14/24 1542  Resp 12 04/14/24 1542  SpO2 100 % 04/14/24 1542  Vitals shown include unfiled device data.  Last Pain:  Vitals:   04/14/24 1143  TempSrc:   PainSc: 8       Patients Stated Pain Goal: 5 (04/14/24 1125)  Complications: No notable events documented.

## 2024-04-14 NOTE — Anesthesia Preprocedure Evaluation (Signed)
 Anesthesia Evaluation  Patient identified by MRN, date of birth, ID band Patient awake    Reviewed: Allergy & Precautions, NPO status , Patient's Chart, lab work & pertinent test results  History of Anesthesia Complications (+) PONV and history of anesthetic complications  Airway Mallampati: II  TM Distance: >3 FB Neck ROM: Full    Dental no notable dental hx.    Pulmonary asthma    Pulmonary exam normal        Cardiovascular negative cardio ROS Normal cardiovascular exam     Neuro/Psych  Headaches, Seizures -, Well Controlled,  PSYCHIATRIC DISORDERS Anxiety     PTSD (post-traumatic stress disorder)  Neuromuscular disease CVA, No Residual Symptoms    GI/Hepatic negative GI ROS, Neg liver ROS,,,  Endo/Other  negative endocrine ROS    Renal/GU negative Renal ROS     Musculoskeletal negative musculoskeletal ROS (+)    Abdominal   Peds  Hematology  (+) Blood dyscrasia, anemia   Anesthesia Other Findings RIGHT URETERAL STONE  Reproductive/Obstetrics Hcg negative                             Anesthesia Physical Anesthesia Plan  ASA: 2  Anesthesia Plan: General   Post-op Pain Management: Precedex    Induction: Intravenous  PONV Risk Score and Plan: 4 or greater and Ondansetron , Dexamethasone , Midazolam , Treatment may vary due to age or medical condition and TIVA  Airway Management Planned: Oral ETT  Additional Equipment:   Intra-op Plan:   Post-operative Plan: Extubation in OR  Informed Consent: I have reviewed the patients History and Physical, chart, labs and discussed the procedure including the risks, benefits and alternatives for the proposed anesthesia with the patient or authorized representative who has indicated his/her understanding and acceptance.     Dental advisory given  Plan Discussed with: CRNA  Anesthesia Plan Comments: (PAT note 02/24/2024)        Anesthesia Quick Evaluation

## 2024-04-14 NOTE — Op Note (Signed)
 Operative Note  Preoperative diagnosis:  1.  Right renal calculus  Postoperative diagnosis: 1.  Right renal calculus   Procedure(s): 1.  Right percutaneous nephrolithotomy  Surgeon: Aimee Alf   Assistants: Dr. Salli Crawley  Anesthesia: General  Complications: None immediate  EBL: 100 cc  Specimens: 1.  Renal calculus  Drains/Catheters: 1.  14 French nephrostomy tube 16 French silicone catheter  Intraoperative findings: Complex pelvis with significant bleeding small stone found in the renal pelvis no stones noted within the ureter antegrade nephrostogram demonstrated proper placement of nephrostomy tube.  Indication: 33 year old female who had a proximal ureteral stone attempted ureteroscopy on Tuesday, 04/12/2024.  She was unable to tolerate stents the stent was removed in the PACU she was admitted for PCNL as due to inability to tolerate pain she had nephrostomy tube placed on 04/13/2024 she will have PCNL today.    Description of procedure:  The patient was identified and consent was obtained.  The patient was taken to the operating room and placed in the supine position.  The patient was placed under general anesthesia.  Perioperative antibiotics were administered.  The patient was placed in prone position and all pressure points were padded.  Patient was prepped and draped in a standard sterile fashion and a timeout was performed.  A sensor wire was advanced through the nephroureteral stent down to the bladder under fluoroscopic guidance and the nephroureteral stent was removed.  An incision was then made on the skin.  A dual-lumen ureteral catheter was advanced over the Super Stiff wire into the renal pelvis and an antegrade nephrostogram was performed.  This showed a well opacified kidney and a filling defect corresponding to the stone of interest.  I advanced the dual-lumen ureteral catheter into the proximal ureter under fluoroscopic guidance followed by placement  of a second Super Stiff wire down to the bladder under fluoroscopic guidance.  The dual-lumen catheter was removed.  An incision was made alongside the wires.   A 28 cm 11-13 [place] access sheath was then advanced over the wire into the renal pelvis and the flexible ureteroscope was attempted to navigate the pelvis due to the angle of the access there was poor mobility with the flexible ureteroscope so this was abandoned and decision was made to place a 30 [place] sheath.   The balloon dilator was then advanced over one of the wires and into the renal pelvis fluoroscopic guidance and the tract was dilated to a pressure of 20.  The sheath was advanced over the balloon and into the renal pelvis.  The balloon was withdrawn keeping the sheath in place.  The nephroscope was advanced into the kidney.  There was significant blood and there was no stones can be found this could be occluded by the blood clot.  At this point a wire was placed down the ureter and a flexible ureteroscope advanced in the ureter all the way down the mid ureter evaluation the ureter demonstrated no residual stone within the ureter.  A cystoscope was then used to evaluate within the bladder this was done significant round after significant time evaluation Dr. Dulcy Gibney was called to come evaluate Carmelita Ching to be found.  Evaluation the flexible cystoscope was able to identify the stone this was grasped with a basket and removed.  After the stone was removed using the existing 2 wires that were down the ureter at the 1 that was going through the sheath a 14 French nephrostomy tube was then placed over one of  the wires and was allowed to curl within the renal pelvis antegrade pyelogram demonstrated within the renal pelvis as well as contrast going down the ureter.  The sheath was then removed pressure was placed in the kidney the different ostomy tube was tied in place with a 3-0 nylon and then the skin was closed with 3-0 Chromic Gut suture.   Another antegrade nephrostogram was done demonstrating proper placement of the nephrostomy tube the existing sensor wire was then removed.  Pressure was then held in the kidney for 2 minutes and the procedure was completed.  Patient was then extubated and taken to the PACU in stable condition.  Plan: Patient will remain under observation overnight.  She will do CBC and Chem-7 pending results of this she will be able to eat.

## 2024-04-14 NOTE — Anesthesia Postprocedure Evaluation (Signed)
 Anesthesia Post Note  Patient: [name]  Procedure(s) Performed: RADIOLOGY WITH ANESTHESIA (Right)     Patient location during evaluation: PACU Anesthesia Type: General Level of consciousness: awake Pain management: pain level controlled Vital Signs Assessment: post-procedure vital signs reviewed and stable Respiratory status: spontaneous breathing, nonlabored ventilation and respiratory function stable Cardiovascular status: blood pressure returned to baseline and stable Postop Assessment: no apparent nausea or vomiting Anesthetic complications: no   No notable events documented.  Last Vitals:  Vitals:   04/13/24 1939 04/14/24 0439  BP: 99/72 105/69  Pulse: (!) 53 (!) 47  Resp: 16 16  Temp: 36.5 C 36.8 C  SpO2: 100% 98%    Last Pain:  Vitals:   04/14/24 0653  TempSrc:   PainSc: 8                  [name]

## 2024-04-14 NOTE — Progress Notes (Signed)
 IVT to bedside for DBIV- pt currently in shower. Will return.

## 2024-04-14 NOTE — Anesthesia Procedure Notes (Signed)
 Procedure Name: Intubation Date/Time: 04/14/2024 1:11 PM  Performed by: Raylene Calamity, CRNAPre-anesthesia Checklist: Patient identified, Emergency Drugs available, Suction available and Patient being monitored Patient Re-evaluated:Patient Re-evaluated prior to induction Oxygen Delivery Method: Circle System Utilized Preoxygenation: Pre-oxygenation with 100% oxygen Induction Type: IV induction Ventilation: Mask ventilation without difficulty Laryngoscope Size: Mac and 3 Grade View: Grade I Tube type: Oral Tube size: 7.0 mm Number of attempts: 1 Airway Equipment and Method: Stylet and Oral airway Placement Confirmation: ETT inserted through vocal cords under direct vision, positive ETCO2 and breath sounds checked- equal and bilateral Secured at: 21 cm Tube secured with: Tape Dental Injury: Teeth and Oropharynx as per pre-operative assessment

## 2024-04-14 NOTE — Anesthesia Postprocedure Evaluation (Signed)
 Anesthesia Post Note  Patient: [name]  Procedure(s) Performed: NEPHROLITHOTOMY PERCUTANEOUS (Right: Back)     Patient location during evaluation: PACU Anesthesia Type: General Level of consciousness: awake and alert, patient cooperative and oriented Pain control: continuing pain medication. Vital Signs Assessment: post-procedure vital signs reviewed and stable Respiratory status: spontaneous breathing, nonlabored ventilation, respiratory function stable and patient connected to nasal cannula oxygen Cardiovascular status: blood pressure returned to baseline and stable Postop Assessment: no apparent nausea or vomiting Anesthetic complications: no   No notable events documented.  Last Vitals:  Vitals:   04/14/24 1600 04/14/24 1615  BP: 101/66 107/72  Pulse: (!) 45 (!) 51  Resp: 10 12  Temp:    SpO2: 100% 100%    Last Pain:  Vitals:   04/14/24 1625  TempSrc:   PainSc: 10-Worst pain ever                 [name],E. Amaryah Mallen

## 2024-04-14 NOTE — Progress Notes (Signed)
 1 Day Post-Op Subjective: Pt had some nausea overnight subsided now, pain with urination has improved. She is still having back pain. Cr has increased likely due to toradol    Objective: Vital signs in last 24 hours: Temp:  [97.1 F (36.2 C)-98.5 F (36.9 C)] 98.3 F (36.8 C) (05/01 0439) Pulse Rate:  [39-54] 47 (05/01 0439) Resp:  [12-20] 16 (05/01 0439) BP: (94-119)/(53-72) 105/69 (05/01 0439) SpO2:  [92 %-100 %] 98 % (05/01 0439) Weight:  [43.5 kg] 43.5 kg (04/30 1151)  Intake/Output from previous day: 04/30 0701 - 05/01 0700 In: 610 [P.O.:10; I.V.:500; IV Piggyback:100] Out: -  Intake/Output this shift: No intake/output data recorded.  Physical Exam:  General: Alert and oriented CV: RRR Lungs: Clear Back:Right neph tube covered with IR tube  GU: no foley    Lab Results: Recent Labs    04/13/24 0354 04/14/24 0353  HGB 11.3* 10.2*  HCT 32.6* 30.8*   BMET Recent Labs    04/13/24 0354 04/14/24 0353  NA 135 134*  K 3.5 3.7  CL 102 101  CO2 25 25  GLUCOSE 119* 150*  BUN 22* 28*  CREATININE 0.89 1.17*  CALCIUM 8.5* 8.4*     Studies/Results: IR NEPHROSTOMY PLACEMENT RIGHT Result Date: 04/13/2024 INDICATION: 33 year old with a right renal pelvic stone and right ureter narrowing. Stone could not be removed in a retrograde fashion. Percutaneous access needed for an antegrade approach. EXAM: PLACEMENT OF RIGHT NEPHROURETERAL CATHETER USING ULTRASOUND AND FLUOROSCOPIC GUIDANCE MEDICATIONS: Invanz  1 gm ANESTHESIA/SEDATION: General anesthesia CONTRAST:  25 mL Omnipaque  300-administered into the collecting system(s) FLUOROSCOPY TIME:  Radiation Exposure Index (as provided by the fluoroscopic device): 122 mGy Kerma COMPLICATIONS: None immediate. PROCEDURE: The procedure was explained to the patient. The risks and benefits of the procedure were discussed and the patient's questions were addressed. Informed consent was obtained from the patient. Patient was intubated and  placed under general anesthesia. Patient placed in a prone position. Right flank was prepped and draped in sterile fashion. Maximal barrier sterile technique was utilized including caps, mask, sterile gowns, sterile gloves, sterile drape, hand hygiene and skin antiseptic. Ultrasound was used to identify the right kidney. Skin was anesthetized with 1% lidocaine . Using ultrasound guidance, 21 gauge needle was directed into the right kidney lower pole. Calyx could not be punctured using ultrasound. Therefore, the needle was directed into a renal infundibulum and the renal collecting system was opacified with contrast. A lower pole calyx was identified with contrast. Using fluoroscopic guidance, a second 21 gauge needle was directed into the lower pole calyx. 0.018 wire was advanced into the renal pelvis and transitional dilator set was placed. Contrast injection confirmed placement in the collecting system. The dilator set was removed over a Bentson wire and a 4 French glide catheter was successfully advanced down the right ureter into the urinary bladder. Contrast injection confirmed placement in the urinary bladder. The catheter was flushed with saline and capped. Catheter was sutured to skin and a dressing was placed. Fluoroscopic and ultrasound images were taken and saved for documentation. FINDINGS: Ultrasound demonstrated minimal right hydronephrosis. Percutaneous access was obtained from a lower pole calyx. There was a filling defect in the renal pelvis compatible with the known stone. This stone appeared to move by the end of the procedure. IMPRESSION: Successful placement of a right percutaneous nephroureteral catheter using ultrasound and fluoroscopic guidance. Electronically Signed   By: Elene Griffes M.D.   On: 04/13/2024 15:29   DG C-Arm 1-60 Min-No Report Result Date:  04/12/2024 Fluoroscopy was utilized by the requesting physician.  No radiographic interpretation.    Assessment/Plan: 80 F w/ R renal  pelvis stone narrow ureter unable to accommodate a scope and unable to tolerate stent. Planning for PCNL on Thursday.    #Stone: - NPO this AM - s/p R perc tube on 4/30 - plan for R PCNL later today      # Contrast allergy - pre contrast medications ordered   # Hx of seizures - all anit seizure medication ordered    # Pain  - multimodal pain regimen ordered   # Low BP - this is base line per patient  - she appears stable and does not need BP support at this time   #AKI: - suspect 2/2 to pre renal as has been NPO for close to 2 days  - toradol  stopped  - IVF fluids ordered    LOS: 1 day   Aimee Houseman MD 04/14/2024, 7:04 AM [facility]

## 2024-04-15 ENCOUNTER — Encounter (HOSPITAL_COMMUNITY): Payer: Self-pay | Admitting: Urology

## 2024-04-15 LAB — BASIC METABOLIC PANEL WITH GFR
Anion gap: 5 (ref 5–15)
BUN: 20 mg/dL (ref 6–20)
CO2: 24 mmol/L (ref 22–32)
Calcium: 7.9 mg/dL — ABNORMAL LOW (ref 8.9–10.3)
Chloride: 110 mmol/L (ref 98–111)
Creatinine, Ser: 0.77 mg/dL (ref 0.44–1.00)
GFR, Estimated: >60 mL/min (ref >60)
Glucose, Bld: 141 mg/dL — ABNORMAL HIGH (ref 70–99)
Potassium: 4 mmol/L (ref 3.5–5.1)
Sodium: 139 mmol/L (ref 135–145)

## 2024-04-15 LAB — CBC
HCT: 28.4 % — ABNORMAL LOW (ref 36.0–46.0)
Hemoglobin: 8.9 g/dL — ABNORMAL LOW (ref 12.0–15.0)
MCH: 29.8 pg (ref 26.0–34.0)
MCHC: 31.3 g/dL (ref 30.0–36.0)
MCV: 95 fL (ref 80.0–100.0)
Platelets: 252 10*3/uL (ref 150–400)
RBC: 2.99 MIL/uL — ABNORMAL LOW (ref 3.87–5.11)
RDW: 13.2 % (ref 11.5–15.5)
WBC: 10.5 10*3/uL (ref 4.0–10.5)
nRBC: 0 % (ref 0.0–0.2)

## 2024-04-15 MED ORDER — SIMETHICONE 80 MG PO CHEW
40.0000 mg | CHEWABLE_TABLET | Freq: Four times a day (QID) | ORAL | Status: DC | PRN
  Administered 2024-04-15: 80 mg via ORAL
  Filled 2024-04-15: qty 1

## 2024-04-15 MED ORDER — KETOROLAC TROMETHAMINE 15 MG/ML IJ SOLN
15.0000 mg | Freq: Three times a day (TID) | INTRAMUSCULAR | Status: DC
  Administered 2024-04-15: 15 mg via INTRAVENOUS
  Filled 2024-04-15: qty 1

## 2024-04-15 MED ORDER — GENTAMICIN SULFATE 40 MG/ML IJ SOLN
220.0000 mg | INTRAVENOUS | Status: DC
  Filled 2024-04-15: qty 5.5

## 2024-04-15 MED ORDER — HEPARIN SOD (PORK) LOCK FLUSH 100 UNIT/ML IV SOLN
500.0000 [IU] | INTRAVENOUS | Status: AC | PRN
  Administered 2024-04-15: 500 [IU]

## 2024-04-15 MED ORDER — GENTAMICIN SULFATE 40 MG/ML IJ SOLN
220.0000 mg | INTRAVENOUS | Status: DC
  Administered 2024-04-15: 220 mg via INTRAVENOUS
  Filled 2024-04-15: qty 5.5

## 2024-04-15 NOTE — Progress Notes (Signed)
 1 Day Post-Op Subjective: Patient had pain overnight some nausea but that has resolved, she is passing gas. Urinie in neph tube and catheter is clear.   Objective: Vital signs in last 24 hours: Temp:  [97.4 F (36.3 C)-98.6 F (37 C)] 98.4 F (36.9 C) (05/02 0535) Pulse Rate:  [43-75] 57 (05/02 0535) Resp:  [10-20] 20 (05/02 0535) BP: (101-116)/(52-82) 105/62 (05/02 0535) SpO2:  [97 %-100 %] 97 % (05/02 0535) Weight:  [43.5 kg] 43.5 kg (05/01 1128)  Intake/Output from previous day: 05/01 0701 - 05/02 0700 In: 2255.7 [I.V.:2050.2; IV Piggyback:205.5] Out: 1025 [Urine:925; Blood:100] Intake/Output this shift: No intake/output data recorded.  Physical Exam:  General: Alert and oriented CV: RRR Lungs: Clear GH: neph tube un place no ecchymosis, some urine leakage, catheter in place draining clear yellow urine  Lab Results: Recent Labs    04/14/24 0353 04/14/24 1619 04/15/24 0258  HGB 10.2* 9.5* 8.9*  HCT 30.8* 30.9* 28.4*   BMET Recent Labs    04/14/24 1619 04/15/24 0258  NA 138 139  K 3.7 4.0  CL 109 110  CO2 20* 24  GLUCOSE 131* 141*  BUN 20 20  CREATININE 0.80 0.77  CALCIUM 7.6* 7.9*     Studies/Results: DG C-Arm 1-60 Min-No Report Result Date: 04/14/2024 Fluoroscopy was utilized by the requesting physician.  No radiographic interpretation.   DG C-Arm 1-60 Min-No Report Result Date: 04/14/2024 Fluoroscopy was utilized by the requesting physician.  No radiographic interpretation.   IR NEPHROSTOMY PLACEMENT RIGHT Result Date: 04/13/2024 INDICATION: 33 year old with a right renal pelvic stone and right ureter narrowing. Stone could not be removed in a retrograde fashion. Percutaneous access needed for an antegrade approach. EXAM: PLACEMENT OF RIGHT NEPHROURETERAL CATHETER USING ULTRASOUND AND FLUOROSCOPIC GUIDANCE MEDICATIONS: Invanz  1 gm ANESTHESIA/SEDATION: General anesthesia CONTRAST:  25 mL Omnipaque  300-administered into the collecting system(s)  FLUOROSCOPY TIME:  Radiation Exposure Index (as provided by the fluoroscopic device): 122 mGy Kerma COMPLICATIONS: None immediate. PROCEDURE: The procedure was explained to the patient. The risks and benefits of the procedure were discussed and the patient's questions were addressed. Informed consent was obtained from the patient. Patient was intubated and placed under general anesthesia. Patient placed in a prone position. Right flank was prepped and draped in sterile fashion. Maximal barrier sterile technique was utilized including caps, mask, sterile gowns, sterile gloves, sterile drape, hand hygiene and skin antiseptic. Ultrasound was used to identify the right kidney. Skin was anesthetized with 1% lidocaine . Using ultrasound guidance, 21 gauge needle was directed into the right kidney lower pole. Calyx could not be punctured using ultrasound. Therefore, the needle was directed into a renal infundibulum and the renal collecting system was opacified with contrast. A lower pole calyx was identified with contrast. Using fluoroscopic guidance, a second 21 gauge needle was directed into the lower pole calyx. 0.018 wire was advanced into the renal pelvis and transitional dilator set was placed. Contrast injection confirmed placement in the collecting system. The dilator set was removed over a Bentson wire and a 4 French glide catheter was successfully advanced down the right ureter into the urinary bladder. Contrast injection confirmed placement in the urinary bladder. The catheter was flushed with saline and capped. Catheter was sutured to skin and a dressing was placed. Fluoroscopic and ultrasound images were taken and saved for documentation. FINDINGS: Ultrasound demonstrated minimal right hydronephrosis. Percutaneous access was obtained from a lower pole calyx. There was a filling defect in the renal pelvis compatible with the known stone. This  stone appeared to move by the end of the procedure. IMPRESSION:  Successful placement of a right percutaneous nephroureteral catheter using ultrasound and fluoroscopic guidance. Electronically Signed   By: Elene Griffes M.D.   On: 04/13/2024 15:29    Assessment/Plan: 63 F w/ R renal pelvis stone narrow ureter unable to accommodate a scope and unable to tolerate stent. Planning for PCNL on Thursday.    #Stone: -s/p R PCNL - neph tube capped this AM  - labs WNL - catheter removed  - if tolerates cap trail will remove neph tube and DC later today.      # Contrast allergy - pre contrast meds completed    # Hx of seizures - all anit seizure medication ordered    # Pain  - multimodal pain regimen ordered - restart toradol  due to kidney function improving    # Low BP - this is base line per patient  - she appears stable and does not need BP support at this time    #AKI: - improved with fluid resuscitation    LOS: 2 days   Aimee Houseman MD 04/15/2024, 7:46 AM [facility]

## 2024-04-15 NOTE — Discharge Summary (Signed)
 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   BRCA2 gene mutation positive in female 03/08/2022   S/P bilateral mastectomy 02/28/2022   Breast cancer (HCC) 02/25/2022   Port-A-Cath in place 12/03/2021   Dysmenorrhea 03/12/2021   Problem with vascular access 02/16/2020   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   Pneumomediastinum (HCC) 03/17/2019   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   Syncope 05/21/2018   Cellulitis of chest wall 10/07/2017   Infected venous access port, initial encounter 10/07/2017   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 Briefly, [name] is a 33 y.o. year old patient with a right UPJ stone she was recently scheduled for ureteroscopy but her ureter was too narrow to accommodate semirigid ureteroscope she was subsequently stented she is unable to tolerate stent and stent was removed in the PACU she was then admitted for PCNL as patient was not able to tolerate going home.Aaron Aas   Hospital  Course: Patient was admitted from the PACU she was kept on n.p.o. overnight.  Overnight there were concerns about pain control and low blood pressure she is at baseline has low blood pressure due to her active lifestyle no pressors were required.  IR placed a nephrostomy tube on the right side on 04/13/2024.  She had significant pain from the nephrostomy tube and pain control was a significant issue.  She had some nausea after nephrostomy tube placed but was taken to the OR on 04/14/2024 for right PCNL patient was complex due to the poor dilation of the collecting system and small stone stone was eventually found and extracted a 14 French nephrostomy tube was left in place.  Postop labs were within normal limits she was able to eat she did have significant pain with this improved over the throughout the day.  She was adamant to have her nephrostomy tube removed.  She had her catheter removed and Trial performed on 04/15/2024 she is able to tolerate the Was Able to Urinate without Issue Nephrostomy Tube Was Removed and Patient Was Discharged.  Patient will follow-up in 2 to 3 months with renal ultrasound.  Management was complicated by extensive allergy history as well as issues with pain management.  Laboratory values:  Recent Labs    04/14/24 0353 04/14/24 1619 04/15/24 0258  WBC 12.8* 11.5* 10.5  HGB 10.2* 9.5* 8.9*  HCT 30.8* 30.9* 28.4*   Recent Labs    04/14/24 0353 04/14/24 1619 04/15/24 0258  NA 134* 138 139  K 3.7 3.7 4.0  CL 101 109 110  CO2 25 20* 24  GLUCOSE 150* 131* 141*  BUN 28* 20 20  CREATININE 1.17*  0.80 0.77  CALCIUM 8.4* 7.6* 7.9*   Recent Labs    04/13/24 0354  INR 1.1   No results for input(s): "LABURIN" in the last 72 hours. Results for orders placed or performed during the hospital encounter of 04/12/24  MRSA Next Gen by PCR, Nasal     Status: None   Collection Time: 04/13/24 11:36 AM   Specimen: Nasal Mucosa; Nasal Swab  Result Value Ref Range Status    MRSA by PCR Next Gen NOT DETECTED NOT DETECTED Final    Comment: (NOTE) The GeneXpert MRSA Assay (FDA approved for NASAL specimens only), is one component of a comprehensive MRSA colonization surveillance program. It is not intended to diagnose MRSA infection nor to guide or monitor treatment for MRSA infections. Test performance is not FDA approved in patients less than 76 years old. Performed at [facility], [address], [place], [place] [address]     Disposition: Home  Discharge instruction: The patient was instructed to be ambulatory but told to refrain from heavy lifting, strenuous activity, or driving.   Discharge medications:  Allergies as of 04/15/2024       Reactions   Cefprozil Anaphylaxis, Rash, Shortness Of Breath   cefprozil   Cephalosporins Anaphylaxis, Shortness Of Breath, Rash   Latex Shortness Of Breath, Rash   Penicillins Anaphylaxis, Hives, Rash, Other (See Comments)   Tongue swells, throat does not close Did it involve swelling of the face/tongue/throat, SOB, or low BP? No Did it involve sudden or severe rash/hives, skin peeling, or any reaction on the inside of your mouth or nose? Yes Did you need to seek medical attention at a hospital or doctor's office? No When did it last happen?      4-5 years If all above answers are "NO", may proceed with cephalosporin use.   Ciprofloxacin  Rash   Rash and vomiting.   Shellfish Allergy Hives, Rash   Grass Pollen(k-o-r-t-swt Vern) Hives   Morphine  And Codeine  Nausea And Vomiting   Severe n/v   Iodine  Rash   Silicone Rash   Tegaderm Ag Mesh [silver] Rash   Vancomycin  Hives, Itching, Rash        Medication List     TAKE these medications    acetaminophen  325 MG tablet Commonly known as: Tylenol  Take 2 tablets (650 mg total) by mouth every 6 (six) hours as needed (pain).   albuterol  108 (90 Base) MCG/ACT inhaler Commonly known as: VENTOLIN  HFA Inhale 2 puffs into the lungs every 6  (six) hours as needed for wheezing or shortness of breath.   buPROPion  300 MG 24 hr tablet Commonly known as: WELLBUTRIN  XL Take 300 mg by mouth daily.   busPIRone  15 MG tablet Commonly known as: BUSPAR  Take 15 mg by mouth 2 (two) times daily.   Dexcom G6 Transmitter Misc Use with dexcom receiver and sensor   fluticasone  furoate-vilanterol 100-25 MCG/ACT Aepb Commonly known as: BREO ELLIPTA  Inhale 1 puff into the lungs daily.   FreeStyle Libre 2 Sensor Misc USE WITH LIBRE APP   Fycompa  8 MG tablet Generic drug: perampanel  Take 1 tablet (8 mg total) by mouth at bedtime. Take 1 tablet every night   Glucagon  Emergency 1 MG Kit Use PRN for hypoglycemia   glucose monitoring kit monitoring kit 1 each by Does not apply route as needed for other. Free style libre on right arm   HYDROcodone -acetaminophen  5-325 MG tablet Commonly known as: NORCO/VICODIN Take 1 tablet by mouth every 6 (six) hours as  needed for severe pain (pain score 7-10).   hyoscyamine  0.125 MG Tbdp disintergrating tablet Commonly known as: ANASPAZ  Place 1 tablet (0.125 mg total) under the tongue every 6 (six) hours as needed for up to 20 doses.   ibuprofen  800 MG tablet Commonly known as: ADVIL  Take 1 tablet (800 mg total) by mouth every 8 (eight) hours as needed.   Lacosamide  100 MG Tabs Commonly known as: Vimpat  Take 1 tablet in AM, 2 tablets in PM   LORazepam  0.5 MG tablet Commonly known as: ATIVAN  TAKE 1 TABLET AS NEEDED FOR SEIZURE. DO NOT TAKE MORE THAN 2-3 A WEEK.   methocarbamol  750 MG tablet Commonly known as: ROBAXIN  Take 1 tablet (750 mg total) by mouth 4 (four) times daily for 20 doses.   montelukast  10 MG tablet Commonly known as: SINGULAIR  TAKE 1 TABLET BY MOUTH EVERYDAY AT BEDTIME   Motegrity  2 MG Tabs Generic drug: Prucalopride Succinate  TAKE 1 TABLET BY MOUTH EVERY DAY   ondansetron  4 MG disintegrating tablet Commonly known as: ZOFRAN -ODT Take 4 mg by mouth every 8 (eight)  hours.   oxyCODONE  5 MG immediate release tablet Commonly known as: Roxicodone  Take 1 tablet (5 mg total) by mouth every 6 (six) hours as needed for up to 16 doses for severe pain (pain score 7-10).   phenazopyridine  200 MG tablet Commonly known as: Pyridium  Take 1 tablet (200 mg total) by mouth 3 (three) times daily as needed for up to 6 doses.   potassium chloride  10 MEQ tablet Commonly known as: KLOR-CON  Take 1 tablet (10 mEq total) by mouth daily for 7 days.   tamsulosin  0.4 MG Caps capsule Commonly known as: FLOMAX  Take 1 capsule (0.4 mg total) by mouth daily after breakfast. What changed: Another medication with the same name was added. Make sure you understand how and when to take each.   tamsulosin  0.4 MG Caps capsule Commonly known as: FLOMAX  Take 1 capsule (0.4 mg total) by mouth daily after supper. What changed: You were already taking a medication with the same name, and this prescription was added. Make sure you understand how and when to take each.   topiramate  100 MG tablet Commonly known as: TOPAMAX  Take 1 and 1/2 tablets (150mg ) twice a day What changed:  how much to take how to take this when to take this additional instructions        Followup:   Follow-up Information     Cathi Cluster Emilio Harder, MD Follow up.   Specialty: Urology Why: you will be called to set up follow up in 2-3 months with renal ultrasound Contact information: [address] [place] [address] [phone]

## 2024-04-16 ENCOUNTER — Telehealth: Payer: Self-pay

## 2024-04-16 NOTE — Telephone Encounter (Signed)
 Returned call to patient's mother via answering service.  S/p right PCNL on 04/15/23 due to inability to tolerate ureteral stent. Her PCN was removed on 04/15/24 and she was discharged. Per mom, she has had persistent nausea/emesis since discharge. Tmax 99.6.   I discussed that I cannot safely prescribe zofran  given her history of QTC prolongation. I emphasized that if she is unable to tolerate PO intake and keep herself hydrate/take her daily meds (patient is on antiseizure medication), then I would recommend she present to the ED for evaluation and management of her nausea. She voiced understanding.  Alphonza Ashing, MD PGY4, Urology [facility]

## 2024-04-18 ENCOUNTER — Telehealth: Payer: Self-pay

## 2024-04-18 ENCOUNTER — Other Ambulatory Visit

## 2024-04-18 DIAGNOSIS — F840 Autistic disorder: Secondary | ICD-10-CM | POA: Diagnosis not present

## 2024-04-18 DIAGNOSIS — F4312 Post-traumatic stress disorder, chronic: Secondary | ICD-10-CM | POA: Diagnosis not present

## 2024-04-18 NOTE — Transitions of Care (Post Inpatient/ED Visit) (Signed)
 04/18/2024  Name: [name] MRN: [id number] DOB: July 21, 1991  Today's TOC FU Call Status: Today's TOC FU Call Status:: Successful TOC FU Call Completed TOC FU Call Complete Date: 04/18/24 Patient's Name and Date of Birth confirmed.  Transition Care Management Follow-up Telephone Call Date of Discharge: 04/15/24 Discharge Facility: [facility]) Type of Discharge: Inpatient Admission Primary Inpatient Discharge Diagnosis:: Right UPJ stone How have you been since you were released from the hospital?: Better (Doing better, still recovering  pain 6/10) Any questions or concerns?: No  Items Reviewed: Did you receive and understand the discharge instructions provided?: Yes Medications obtained,verified, and reconciled?: Yes (Medications Reviewed) Any new allergies since your discharge?: No Dietary orders reviewed?: Yes Type of Diet Ordered:: low salt heart healthy diet. Do you have support at home?: Yes People in Home [RPT]: other relative(s) Name of Support/Comfort Primary Source: "family"  Medications Reviewed Today: Medications Reviewed Today     Reviewed by Vanetta Generous, RN (Registered Nurse) on 04/18/24 at 1118  Med List Status: <None>   Medication Order Taking? Sig Documenting Provider Last Dose Status Informant  acetaminophen  (TYLENOL ) 325 MG tablet [id number] Yes Take 2 tablets (650 mg total) by mouth every 6 (six) hours as needed (pain). Lujean Sake, MD Taking Active Self           Med Note Timm Foot, Michae Aden   Thu Apr 07, 2024 10:58 AM)    albuterol  (VENTOLIN  HFA) 108 [phone] Base) MCG/ACT inhaler [id number] No Inhale 2 puffs into the lungs every 6 (six) hours as needed for wheezing or shortness of breath.  Patient not taking: Reported on 04/18/2024   Alto Atta, NP Not Taking Active Self  buPROPion  (WELLBUTRIN  XL) 300 MG 24 hr tablet [id number] No Take 300 mg by mouth daily.  Patient not taking: Reported on 04/18/2024   [provider] Not Taking Active Self   busPIRone  (BUSPAR ) 15 MG tablet [id number] No Take 15 mg by mouth 2 (two) times daily.  Patient not taking: Reported on 04/18/2024   [provider] Not Taking Active Self  Continuous Blood Gluc Sensor (FREESTYLE LIBRE 2 SENSOR) MISC [id number] Yes USE WITH LIBRE APP Nafziger, Randel Buss, NP Taking Active Self  Continuous Blood Gluc Transmit (DEXCOM G6 TRANSMITTER) MISC [id number] Yes Use with dexcom receiver and sensor Nafziger, Randel Buss, NP Taking Active Self  fluticasone  furoate-vilanterol (BREO ELLIPTA ) 100-25 MCG/ACT AEPB [id number] Yes Inhale 1 puff into the lungs daily. Alto Atta, NP Taking Active Self  Glucagon , rDNA, (GLUCAGON  EMERGENCY) 1 MG KIT [id number] No Use PRN for hypoglycemia  Patient not taking: Reported on 04/18/2024   Alto Atta, NP Not Taking Active Self           Med Note Yves Herb, MEGAN   Thu Feb 25, 2024  9:44 AM) Has on hand if needed.  glucose monitoring kit (FREESTYLE) monitoring kit [id number] Yes 1 each by Does not apply route as needed for other. Free style libre on right arm [provider] Taking Active Self  HYDROcodone -acetaminophen  (NORCO/VICODIN) 5-325 MG tablet [id number] No Take 1 tablet by mouth every 6 (six) hours as needed for severe pain (pain score 7-10).  Patient not taking: Reported on 04/18/2024   Alleen Arbour, NP Not Taking Active Self  hyoscyamine  (ANASPAZ ) 0.125 MG TBDP disintergrating tablet [id number] Yes Place 1 tablet (0.125 mg total) under the tongue every 6 (six) hours as needed for up to 20 doses. Thelbert Finner, MD Taking Active   ibuprofen  (ADVIL ) 800 MG tablet  [id number] Yes Take 1 tablet (800 mg total) by mouth every 8 (eight) hours as needed. Rexie Catena, PA-C Taking Active Self  Lacosamide  (VIMPAT ) 100 MG TABS [id number] No Take 1 tablet in AM, 2 tablets in PM  Patient not taking: Reported on 04/18/2024   Jhonny Moss, MD Not Taking Active Self  LORazepam  (ATIVAN ) 0.5 MG tablet [id number] No TAKE 1 TABLET AS NEEDED  FOR SEIZURE. DO NOT TAKE MORE THAN 2-3 A WEEK.  Patient not taking: Reported on 04/18/2024   Jhonny Moss, MD Not Taking Active Self  montelukast  (SINGULAIR ) 10 MG tablet [id number] No TAKE 1 TABLET BY MOUTH EVERYDAY AT BEDTIME  Patient not taking: Reported on 04/18/2024   Alto Atta, NP Not Taking Active Self  MOTEGRITY  2 MG TABS [id number] No TAKE 1 TABLET BY MOUTH EVERY DAY  Patient not taking: Reported on 04/07/2024   Lindle Rhea, MD Not Taking Active Self  ondansetron  (ZOFRAN -ODT) 4 MG disintegrating tablet [id number] No Take 4 mg by mouth every 8 (eight) hours.  Patient not taking: Reported on 04/18/2024   [provider] Not Taking Active Self  oxyCODONE  (ROXICODONE ) 5 MG immediate release tablet [id number] Yes Take 1 tablet (5 mg total) by mouth every 6 (six) hours as needed for up to 16 doses for severe pain (pain score 7-10). Thelbert Finner, MD Taking Active   perampanel  (FYCOMPA ) 8 MG tablet [id number] No Take 1 tablet (8 mg total) by mouth at bedtime. Take 1 tablet every night  Patient not taking: Reported on 04/18/2024   Jhonny Moss, MD Not Taking Active Self  phenazopyridine  (PYRIDIUM ) 200 MG tablet [id number] No Take 1 tablet (200 mg total) by mouth 3 (three) times daily as needed for up to 6 doses.  Patient not taking: Reported on 04/18/2024   Thelbert Finner, MD Not Taking Active   potassium chloride  (KLOR-CON ) 10 MEQ tablet [id number]  Take 1 tablet (10 mEq total) by mouth daily for 7 days. Rexie Catena, PA-C  Expired 04/12/24 2359 Self  tamsulosin  (FLOMAX ) 0.4 MG CAPS capsule [id number] No Take 1 capsule (0.4 mg total) by mouth daily after breakfast.  Patient not taking: Reported on 04/18/2024   Alleen Arbour, NP Not Taking Active Self  tamsulosin  (FLOMAX ) 0.4 MG CAPS capsule [id number] Yes Take 1 capsule (0.4 mg total) by mouth daily after supper. Thelbert Finner, MD Taking Active   topiramate  (TOPAMAX ) 100 MG tablet [id number] No Take 1 and 1/2  tablets (150mg ) twice a day  Patient not taking: Reported on 04/18/2024   Jhonny Moss, MD Not Taking Active Self            Home Care and Equipment/Supplies: Were Home Health Services Ordered?: No Any new equipment or medical supplies ordered?: No  Functional Questionnaire: Do you need assistance with bathing/showering or dressing?: No Do you need assistance with meal preparation?: No Do you need assistance with eating?: No Do you have difficulty maintaining continence: No Do you need assistance with getting out of bed/getting out of a chair/moving?: No Do you have difficulty managing or taking your medications?: No  Follow up appointments reviewed: PCP Follow-up appointment confirmed?: No (Patient states that she will call and make an apppointment.) MD Provider Line [phone] Given: No Specialist Hospital Follow-up appointment confirmed?: Yes Date of Specialist follow-up appointment?: 06/08/24 Follow-Up Specialty Provider:: urology Do you need transportation to your follow-up appointment?: No Do you understand care options if your condition(s) worsen?: Yes-patient verbalized understanding  SDOH Interventions  Today    Flowsheet Row Most Recent Value  SDOH Interventions   Food Insecurity Interventions Intervention Not Indicated  Housing Interventions Intervention Not Indicated  Transportation Interventions Intervention Not Indicated  Utilities Interventions Intervention Not Indicated      Patient reports that he biggest concern today is the nausea. Reports she has had nausea and vomiting since hospital discharge. Reports that she is unable to take her medications and reports that she has had some low CBG readings in 50-60.  Reports she is eating dry toast and drinking fluids.  Reports she called the on call urologist over the weekend for fever and vomiting.  Reports that she was told not take her zofran .  Denies any signs of UTI.  Intervention:  In basket  message sent to urology and PCP. Aaron AasMessage from urologist who states" I just spoke with patient". Placed call back to patient and no answer. Left a message for patient to return my call. 1548pm  call back to patient without an answer. Left a message that if patient needs additional assistance to call me back.  Orpha Blade, RN, BSN, CEN [employer]- Transition of Care Team.  [facility] [phone]

## 2024-04-19 DIAGNOSIS — F840 Autistic disorder: Secondary | ICD-10-CM | POA: Diagnosis not present

## 2024-04-19 DIAGNOSIS — F4312 Post-traumatic stress disorder, chronic: Secondary | ICD-10-CM | POA: Diagnosis not present

## 2024-04-21 DIAGNOSIS — F840 Autistic disorder: Secondary | ICD-10-CM | POA: Diagnosis not present

## 2024-04-21 DIAGNOSIS — F4312 Post-traumatic stress disorder, chronic: Secondary | ICD-10-CM | POA: Diagnosis not present

## 2024-04-22 LAB — STONE ANALYSIS
Calcium Oxalate Dihydrate: 100 %
Weight Calculi: 58 mg

## 2024-04-25 ENCOUNTER — Telehealth: Payer: Self-pay

## 2024-04-25 DIAGNOSIS — R079 Chest pain, unspecified: Secondary | ICD-10-CM

## 2024-04-25 NOTE — Telephone Encounter (Signed)
 Spoke with pt and explained that our device clinic had looked and the chest tightness was not associated with any arrhythmias and wanted to advise pt to be evaluated by ED for chest tightness or similar symptoms d/t device not being able to pick up on any MI. Explained to pt that I will forward this to Dr. Renna Cary and his nurse to review.  Pt also wanted to ask device clinic why she is getting EKGs and they are showing a long QT but she doesn't see it being picked up on her device. Explained to pt that I will forward this question to device clinic to advise on.

## 2024-04-25 NOTE — Telephone Encounter (Signed)
 Patient used symptom activation on ILR recorder 04/22/24 @ 12:32 for chest tightness. EGM c/w NSR. ILR. Will forward to gen cards as symptoms are not associated with any arrhythmias.   Attempted to contact patient. No answer, left message to call back. Would like to advise patient if she has symptoms of chest pain to go to ER as the device will not detect MI.  Will forward to gen cards also to follow up with patient about chest pain.

## 2024-04-25 NOTE — Telephone Encounter (Signed)
 Called patient to advise her loop recorder was placed for syncope. Patient states she had recent kidney stone and was told while in the hospital she needed an ICD d/t her long QT meeting criteria. Patient missed last apt with Dr. Carolynne Citron and would like to reschedule to discuss further. Routing to EP scheduler for apt.

## 2024-04-29 NOTE — Telephone Encounter (Signed)
 Spoke with pt whp reports she does still have some chest pain with activity and is agreeable to have the GXT.  Order placed.  Instructions reviewed.  Aware she will be contacted to be scheduled.  She will call back prior to then if further questions and/or concerns

## 2024-04-29 NOTE — Addendum Note (Signed)
 Addended by: Thaddeus Filippo on: 04/29/2024 11:54 AM   Modules accepted: Orders

## 2024-04-29 NOTE — Telephone Encounter (Signed)
 I will defer this to gen cards nurses.

## 2024-05-03 ENCOUNTER — Other Ambulatory Visit: Payer: Self-pay | Admitting: Cardiology

## 2024-05-03 DIAGNOSIS — R072 Precordial pain: Secondary | ICD-10-CM
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
## 2024-05-04 ENCOUNTER — Ambulatory Visit: Admitting: Gastroenterology

## 2024-05-06 ENCOUNTER — Ambulatory Visit (INDEPENDENT_AMBULATORY_CARE_PROVIDER_SITE_OTHER): Payer: BC Managed Care – PPO

## 2024-05-06 DIAGNOSIS — R55 Syncope and collapse: Secondary | ICD-10-CM

## 2024-05-06 LAB — CUP PACEART REMOTE DEVICE CHECK
Date Time Interrogation Session: 20250522232239
Implantable Pulse Generator Implant Date: 20231117

## 2024-05-08 ENCOUNTER — Ambulatory Visit: Payer: Self-pay | Admitting: Internal Medicine

## 2024-05-10 ENCOUNTER — Non-Acute Institutional Stay (HOSPITAL_COMMUNITY): Attending: Adult Health

## 2024-05-11 ENCOUNTER — Encounter: Payer: Self-pay | Admitting: Physician Assistant

## 2024-05-11 NOTE — Addendum Note (Signed)
 Addended by: Lott Rouleau A on: 05/11/2024 08:40 AM   Modules accepted: Orders

## 2024-05-11 NOTE — Progress Notes (Signed)
 [facility] Summary Report / Loop Recorder

## 2024-05-12 ENCOUNTER — Ambulatory Visit: Admitting: Internal Medicine

## 2024-05-12 ENCOUNTER — Telehealth (HOSPITAL_COMMUNITY): Payer: Self-pay | Admitting: *Deleted

## 2024-05-12 NOTE — Telephone Encounter (Signed)
 Left a detailed message regarding her ETT on 05/19/24 at 12:45.

## 2024-05-13 ENCOUNTER — Ambulatory Visit
Admission: RE | Admit: 2024-05-13 | Discharge: 2024-05-13 | Disposition: A | Discharge status: Home / Self Care | Source: Ambulatory Visit | Attending: General Surgery | Admitting: General Surgery

## 2024-05-13 DIAGNOSIS — Z9013 Acquired absence of bilateral breasts and nipples: Secondary | ICD-10-CM | POA: Diagnosis not present

## 2024-05-13 DIAGNOSIS — R2231 Localized swelling, mass and lump, right upper limb: Secondary | ICD-10-CM

## 2024-05-13 DIAGNOSIS — N6331 Unspecified lump in axillary tail of the right breast: Secondary | ICD-10-CM | POA: Diagnosis not present

## 2024-05-13 DIAGNOSIS — Z1501 Genetic susceptibility to malignant neoplasm of breast: Secondary | ICD-10-CM | POA: Diagnosis not present

## 2024-05-19 ENCOUNTER — Ambulatory Visit (HOSPITAL_COMMUNITY): Admission: RE | Admit: 2024-05-19 | Source: Ambulatory Visit | Attending: Cardiology | Admitting: Cardiology

## 2024-05-22 NOTE — Progress Notes (Deleted)
  Electrophysiology Office Note:   Date:  05/22/2024  ID:  [name], DOB 1991/07/15, MRN [id number]  Primary Cardiologist: Dorothye Gathers, MD Primary Heart Failure: None Electrophysiologist: None  {Click to update primary MD,subspecialty MD or APP then REFRESH:1}    History of Present Illness:   [name] is a 33 y.o. female with h/o *** seen today for post hospital follow up.    Admitted ***  Since discharge from hospital the patient reports doing ***.  she denies chest pain, palpitations, dyspnea, PND, orthopnea, nausea, vomiting, dizziness, syncope, edema, weight gain, or early satiety.   Review of systems complete and found to be negative unless listed in HPI.   EP Information / Studies Reviewed:    {EKGtoday:28818}      Studies:  ***    Arrhythmia / AAD Long QT >  Family hx SCD > father died while running with the patient (thought to be long QT but not confirmed)   Risk Assessment/Calculations:   {Does this patient have ATRIAL FIBRILLATION?[phone]} No BP recorded.  {Refresh Note OR Click here to enter BP  :1}***        Physical Exam:   VS:  LMP 02/11/2021    Wt Readings from Last 3 Encounters:  04/14/24 95 lb 14.4 oz (43.5 kg)  04/11/24 95 lb 14.4 oz (43.5 kg)  03/29/24 96 lb (43.5 kg)     GEN: Well nourished, well developed in no acute distress NECK: No JVD; No carotid bruits CARDIAC: {EPRHYTHM:28826}, no murmurs, rubs, gallops RESPIRATORY:  Clear to auscultation without rales, wheezing or rhonchi  ABDOMEN: Soft, non-tender, non-distended EXTREMITIES:  No edema; No deformity   ASSESSMENT AND PLAN:   ***  Follow up with {EPMDS:28135::"EP Team"} {EPFOLLOW XB:28413}  Signed, Creighton Doffing, NP

## 2024-05-25 ENCOUNTER — Ambulatory Visit: Admitting: Pulmonary Disease

## 2024-05-26 NOTE — Progress Notes (Deleted)
  Electrophysiology Office Note:   Date:  05/26/2024  ID:  [name], DOB 1991-03-05, MRN [id number]  Primary Cardiologist: Dorothye Gathers, MD Primary Heart Failure: None Electrophysiologist: None  {Click to update primary MD,subspecialty MD or APP then REFRESH:1}    History of Present Illness:   [name] is a 34 y.o. female with h/o bradycardia, Long QT, syncope s/p ILR, asthma, ?cryptogenic CVA, bilateral hearing loss / congenital deafness, seizures, migraines seen today for post hospital follow up.    Admitted *** ?? Per pt report but do not see in Care Everywhere. ***  Since discharge from hospital the patient reports doing ***.    She denies chest pain, palpitations, dyspnea, PND, orthopnea, nausea, vomiting, dizziness, syncope, edema, weight gain, or early satiety.   Review of systems complete and found to be negative unless listed in HPI.   EP Information / Studies Reviewed:    EKG is ordered today. Personal review as below.      Studies:  ECHO 2019 > LVEF 60-65%    Arrhythmia / AAD Long QT >  Family hx SCD > father died while running with the patient (thought to be long QT but not confirmed)   Risk Assessment/Calculations:     No BP recorded.  {Refresh Note OR Click here to enter BP  :1}***        Physical Exam:   VS:  LMP 02/11/2021    Wt Readings from Last 3 Encounters:  04/14/24 95 lb 14.4 oz (43.5 kg)  04/11/24 95 lb 14.4 oz (43.5 kg)  03/29/24 96 lb (43.5 kg)     GEN: Well nourished, well developed in no acute distress NECK: No JVD; No carotid bruits CARDIAC: {EPRHYTHM:28826}, no murmurs, rubs, gallops RESPIRATORY:  Clear to auscultation without rales, wheezing or rhonchi  ABDOMEN: Soft, non-tender, non-distended EXTREMITIES:  No edema; No deformity   ASSESSMENT AND PLAN:    Long QT  Hx Syncope s/p ILR  Bradycardia  Intolerant to Nadolol  in past  -encourage hydration / sodium intake for BP  -re-trial nadolol  *** ?  -ILR review shows ***   -EKG reviewed with QTc ***  -previously referred multiple times for genetic testing *** > repeat??    Follow up with Dr. Carolynne Citron {EPFOLLOW WU:98119}  Signed, Creighton Doffing, NP-C, AGACNP-BC [facility] - Electrophysiology  05/26/2024, 7:40 AM

## 2024-05-27 ENCOUNTER — Ambulatory Visit: Admitting: Pulmonary Disease

## 2024-05-27 DIAGNOSIS — R9431 Abnormal electrocardiogram [ECG] [EKG]: Secondary | ICD-10-CM

## 2024-05-27 DIAGNOSIS — R001 Bradycardia, unspecified: Secondary | ICD-10-CM

## 2024-05-27 DIAGNOSIS — R55 Syncope and collapse: Secondary | ICD-10-CM

## 2024-05-30 ENCOUNTER — Telehealth: Payer: Self-pay | Admitting: Neurology

## 2024-05-30 ENCOUNTER — Telehealth (HOSPITAL_COMMUNITY): Payer: Self-pay | Admitting: *Deleted

## 2024-05-30 NOTE — Telephone Encounter (Signed)
 Pt called in stating her psychiatrist that she has been seeing in her practice has left the practice suddenly. She used to see them once a year. She is wanting to see if Dr. Ty Gales could take over prescribing her buspar  15 mg twice a day, Wellbutrin  300 mg twice a day. She has taken both of them for 3 years without adjustments.

## 2024-05-30 NOTE — Telephone Encounter (Signed)
 Instructions for upcoming stress test sent via [employer].  Argentina Bees, RN

## 2024-05-30 NOTE — Telephone Encounter (Signed)
 She will need to ask PCP for these and let PCP know that she has been stable on them. Thanks

## 2024-05-30 NOTE — Telephone Encounter (Signed)
 Pt called informed that Dr Ty Gales stated that her PCP needed to take over buspar  15 mg twice a day, Wellbutrin  300 mg twice a day.

## 2024-05-31 ENCOUNTER — Ambulatory Visit: Admitting: Physician Assistant

## 2024-05-31 DIAGNOSIS — N201 Calculus of ureter: Secondary | ICD-10-CM | POA: Diagnosis not present

## 2024-06-01 ENCOUNTER — Ambulatory Visit: Payer: Self-pay | Admitting: Internal Medicine

## 2024-06-06 ENCOUNTER — Ambulatory Visit (INDEPENDENT_AMBULATORY_CARE_PROVIDER_SITE_OTHER): Payer: Self-pay

## 2024-06-06 DIAGNOSIS — R55 Syncope and collapse: Secondary | ICD-10-CM

## 2024-06-06 LAB — CUP PACEART REMOTE DEVICE CHECK
Date Time Interrogation Session: 20250622232902
Implantable Pulse Generator Implant Date: 20231117

## 2024-06-08 ENCOUNTER — Ambulatory Visit: Payer: Self-pay | Admitting: Internal Medicine

## 2024-06-09 ENCOUNTER — Ambulatory Visit (HOSPITAL_COMMUNITY): Payer: Self-pay

## 2024-06-09 ENCOUNTER — Ambulatory Visit (HOSPITAL_COMMUNITY): Payer: BC Managed Care – PPO

## 2024-06-09 DIAGNOSIS — F4312 Post-traumatic stress disorder, chronic: Secondary | ICD-10-CM | POA: Diagnosis not present

## 2024-06-09 DIAGNOSIS — F411 Generalized anxiety disorder: Secondary | ICD-10-CM | POA: Diagnosis not present

## 2024-06-09 DIAGNOSIS — F410 Panic disorder [episodic paroxysmal anxiety] without agoraphobia: Secondary | ICD-10-CM | POA: Diagnosis not present

## 2024-06-13 NOTE — Progress Notes (Signed)
 [facility] Summary Report / Loop Recorder

## 2024-06-17 ENCOUNTER — Other Ambulatory Visit: Payer: Self-pay | Admitting: Neurology

## 2024-06-20 DIAGNOSIS — F840 Autistic disorder: Secondary | ICD-10-CM | POA: Diagnosis not present

## 2024-06-20 DIAGNOSIS — F50022 Anorexia nervosa, binge eating/purging type, severe: Secondary | ICD-10-CM | POA: Diagnosis not present

## 2024-06-20 DIAGNOSIS — F4312 Post-traumatic stress disorder, chronic: Secondary | ICD-10-CM | POA: Diagnosis not present

## 2024-06-23 DIAGNOSIS — F50022 Anorexia nervosa, binge eating/purging type, severe: Secondary | ICD-10-CM | POA: Diagnosis not present

## 2024-06-23 DIAGNOSIS — F4312 Post-traumatic stress disorder, chronic: Secondary | ICD-10-CM | POA: Diagnosis not present

## 2024-06-23 DIAGNOSIS — F840 Autistic disorder: Secondary | ICD-10-CM | POA: Diagnosis not present

## 2024-06-27 DIAGNOSIS — F840 Autistic disorder: Secondary | ICD-10-CM | POA: Diagnosis not present

## 2024-06-27 DIAGNOSIS — F4312 Post-traumatic stress disorder, chronic: Secondary | ICD-10-CM | POA: Diagnosis not present

## 2024-06-27 DIAGNOSIS — F50022 Anorexia nervosa, binge eating/purging type, severe: Secondary | ICD-10-CM | POA: Diagnosis not present

## 2024-06-29 ENCOUNTER — Ambulatory Visit: Payer: Self-pay | Admitting: Neurology

## 2024-06-29 DIAGNOSIS — F840 Autistic disorder: Secondary | ICD-10-CM | POA: Diagnosis not present

## 2024-06-29 DIAGNOSIS — F50022 Anorexia nervosa, binge eating/purging type, severe: Secondary | ICD-10-CM | POA: Diagnosis not present

## 2024-06-29 DIAGNOSIS — F4312 Post-traumatic stress disorder, chronic: Secondary | ICD-10-CM | POA: Diagnosis not present

## 2024-06-30 ENCOUNTER — Encounter: Payer: Self-pay | Admitting: Neurology

## 2024-07-04 DIAGNOSIS — F50022 Anorexia nervosa, binge eating/purging type, severe: Secondary | ICD-10-CM | POA: Diagnosis not present

## 2024-07-04 DIAGNOSIS — F840 Autistic disorder: Secondary | ICD-10-CM | POA: Diagnosis not present

## 2024-07-04 DIAGNOSIS — F4312 Post-traumatic stress disorder, chronic: Secondary | ICD-10-CM | POA: Diagnosis not present

## 2024-07-07 ENCOUNTER — Ambulatory Visit (INDEPENDENT_AMBULATORY_CARE_PROVIDER_SITE_OTHER): Payer: Self-pay

## 2024-07-07 DIAGNOSIS — R55 Syncope and collapse: Secondary | ICD-10-CM | POA: Diagnosis not present

## 2024-07-07 LAB — CUP PACEART REMOTE DEVICE CHECK
Date Time Interrogation Session: 20250723233406
Implantable Pulse Generator Implant Date: 20231117

## 2024-07-08 ENCOUNTER — Encounter (HOSPITAL_COMMUNITY): Payer: Self-pay | Admitting: *Deleted

## 2024-07-08 ENCOUNTER — Telehealth (HOSPITAL_COMMUNITY): Payer: Self-pay | Admitting: *Deleted

## 2024-07-08 NOTE — Telephone Encounter (Signed)
 Instructions for ETT sent via [employer].

## 2024-07-10 ENCOUNTER — Ambulatory Visit: Payer: Self-pay | Admitting: Internal Medicine

## 2024-07-11 DIAGNOSIS — F4312 Post-traumatic stress disorder, chronic: Secondary | ICD-10-CM | POA: Diagnosis not present

## 2024-07-11 DIAGNOSIS — F840 Autistic disorder: Secondary | ICD-10-CM | POA: Diagnosis not present

## 2024-07-11 DIAGNOSIS — F50022 Anorexia nervosa, binge eating/purging type, severe: Secondary | ICD-10-CM | POA: Diagnosis not present

## 2024-07-11 NOTE — Progress Notes (Signed)
 [facility] Summary Report / Loop Recorder

## 2024-07-21 ENCOUNTER — Ambulatory Visit (HOSPITAL_COMMUNITY)
Admission: RE | Admit: 2024-07-21 | Payer: Self-pay | Source: Ambulatory Visit | Attending: Cardiology | Admitting: Cardiology

## 2024-07-21 NOTE — Progress Notes (Deleted)
 Ellouise Console, PA-C [address] [place], [place]  [address] Phone: [phone]   Primary Care Physician: Merna Huxley, NP  Primary Gastroenterologist:  Ellouise Console, PA-C / ***  Chief Complaint: Chronic Nausea, vomiting, abdominal pain, constipation, and anemia.       HPI:   [name] is a 33 y.o. female, previous patient Dr. Eda, presents for follow-up of chronic nausea, vomiting, abdominal pain, constipation, and anemia.  04/15/2024 labs: Severe anemia with Hgb 8.9, hematocrit 28, MCV 95.  Normal white count 10.5.  Glucose 141, low calcium 7.9.  Otherwise normal BMP.  Patient last saw Dr. Eda for office visit evaluation of nausea / vomiting / constipation 08/2022.  Patient had mastectomy 02/2022 and had worsening GI symptoms after that.  Chronic constipation attributed to seizure medications, not relieved with MiraLAX .  She was given trial of Motegrity  1 Mg daily.  10/20/2022 EGD by Dr. Eda: Normal esophagus.  Mild gastritis.  Normal duodenum.  Biopsies negative for celiac, H. pylori, and eosinophilic esophagitis.  Biopsies consistent with GERD and mild chronic gastritis.  PMH: Autism spectrum disorder, Cryptogenic Stroke, Sinus bradycardia, Breast Cancer, BRCA2, s/p bilateral mastectomies 02/2022.  Anxiety.  Seizure disorder.  Prior cholecystectomy, appendectomy, hysterectomy, and BSO.  Avoidant restrictive food disorder, anorexia.  Cyclic vomiting syndrome.  Chronic intermittent nausea & vomiting since teenager.  Bilateral hearing aids.  MERFF syndrome.  History of palpitations and syncope followed by cardiology and has an implantable loop recorder (inserted 10/2022).  Device interrogations showed no adverse arrhythmia.  Borderline elevated QT intervals.  Cardiologist Dr. Jeffrie.  Recently scheduled for exercise tolerance test, not yet completed.  Patient denies marijuana, alcohol, or illicit drug use.  No family history of colon cancer, polyps, or GI malignancies.   No family history of IBD or celiac.  Prior abdominal imaging: - 03/25/24 CT Abd / Pelvis without Contrast: 5 mm right renal pelvic stone.  No hydronephrosis.  No other abnormality in the abdomen or pelvis. 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     albuterol  (VENTOLIN  HFA) 108 (90 Base) MCG/ACT inhaler Inhale 2 puffs into the lungs every 6 (six) hours as needed for wheezing or shortness of breath. (Patient not taking: Reported on 04/18/2024) 6.7 g 3   buPROPion  (WELLBUTRIN  XL) 300 MG 24 hr tablet Take 300 mg by mouth daily. (Patient not taking: Reported on 04/18/2024)     busPIRone  (BUSPAR ) 15 MG tablet Take 15 mg by mouth 2 (two) times daily. (Patient not taking: Reported on 04/18/2024)     Continuous Blood Gluc Sensor (FREESTYLE LIBRE 2 SENSOR) MISC USE WITH LIBRE APP 6 each 2   Continuous Blood Gluc Transmit (DEXCOM G6 TRANSMITTER) MISC Use with dexcom receiver and sensor 1 each 3   fluticasone  furoate-vilanterol (BREO ELLIPTA ) 100-25 MCG/ACT AEPB Inhale 1 puff into the lungs daily. 1 each 11   Glucagon , rDNA, (GLUCAGON  EMERGENCY) 1 MG KIT Use PRN for hypoglycemia (Patient not taking: Reported on 04/18/2024) 1 kit 3   glucose monitoring kit (FREESTYLE) monitoring kit 1 each by Does not apply route as needed for other. Free style libre on right arm     HYDROcodone -acetaminophen  (NORCO/VICODIN) 5-325 MG tablet Take 1 tablet  by mouth every 6 (six) hours as needed for severe pain (pain score 7-10). (Patient not taking: Reported on 04/18/2024) 8 tablet 0   hyoscyamine  (ANASPAZ ) 0.125 MG TBDP disintergrating tablet Place 1 tablet (0.125 mg total) under the tongue every 6 (six) hours as needed for up to 20  doses. 20 tablet 0   ibuprofen  (ADVIL ) 800 MG tablet Take 1 tablet (800 mg total) by mouth every 8 (eight) hours as needed. 21 tablet 0   Lacosamide  (VIMPAT ) 100 MG TABS Take 1 tablet in AM, 2 tablets in PM (Patient not taking: Reported on 04/18/2024) 270 tablet 3   LORazepam  (ATIVAN ) 0.5 MG tablet TAKE 1 TABLET AS NEEDED FOR SEIZURE. DO NOT TAKE MORE THAN 2-3 A WEEK. (Patient not taking: Reported on 04/18/2024) 10 tablet 5   montelukast  (SINGULAIR ) 10 MG tablet TAKE 1 TABLET BY MOUTH EVERYDAY AT BEDTIME (Patient not taking: Reported on 04/18/2024) 90 tablet 3   MOTEGRITY  2 MG TABS TAKE 1 TABLET BY MOUTH EVERY DAY (Patient not taking: Reported on 04/07/2024) 30 tablet 5   ondansetron  (ZOFRAN -ODT) 4 MG disintegrating tablet Take 4 mg by mouth every 8 (eight) hours. (Patient not taking: Reported on 04/18/2024)     oxyCODONE  (ROXICODONE ) 5 MG immediate release tablet Take 1 tablet (5 mg total) by mouth every 6 (six) hours as needed for up to 16 doses for severe pain (pain score 7-10). 16 tablet 0   perampanel  (FYCOMPA ) 8 MG tablet Take 1 tablet (8 mg total) by mouth at bedtime. Take 1 tablet every night (Patient not taking: Reported on 04/18/2024) 90 tablet 3   phenazopyridine  (PYRIDIUM ) 200 MG tablet Take 1 tablet (200 mg total) by mouth 3 (three) times daily as needed for up to 6 doses. (Patient not taking: Reported on 04/18/2024) 6 tablet 0   potassium chloride  (KLOR-CON ) 10 MEQ tablet Take 1 tablet (10 mEq total) by mouth daily for 7 days. 7 tablet 0   tamsulosin  (FLOMAX ) 0.4 MG CAPS capsule Take 1 capsule (0.4 mg total) by mouth daily after breakfast. (Patient not taking: Reported on 04/18/2024) 5 capsule 0   tamsulosin  (FLOMAX ) 0.4 MG CAPS capsule Take 1 capsule (0.4 mg total) by mouth daily after supper. 30 capsule 0   topiramate  (TOPAMAX ) 100 MG tablet Take 1 tablet three times a day 270 tablet 3   No current facility-administered medications for this visit.    Allergies as of 07/22/2024 - Review Complete  04/18/2024  Allergen Reaction Noted   Cefprozil Anaphylaxis, Rash, and Shortness Of Breath 08/24/2017   Cephalosporins Anaphylaxis, Shortness Of Breath, and Rash 09/15/2013   Latex Shortness Of Breath and Rash 10/10/2014   Penicillins Anaphylaxis, Hives, Rash, and Other (See Comments) 10/08/2017   Ciprofloxacin  Rash 03/03/2022   Shellfish allergy Hives and Rash 11/26/2011   Grass pollen(k-o-r-t-swt vern) Hives 12/10/2021   Morphine  and codeine  Nausea And Vomiting 07/05/2020   Iodine  Rash 08/18/2023   Silicone Rash 04/11/2024   Tegaderm ag mesh [silver] Rash 09/30/2018   Vancomycin  Hives, Itching, and Rash 06/19/2021    Past Medical History:  Diagnosis Date   Acne    Anemia    hx of   Anxiety    Asthma    Carpal tunnel syndrome    right   Complex partial seizures (HCC)    last seizure 02-20-3021   Complication of anesthesia    slow to wake up, disoriented, hx of  mild seizure after surgery -2019 gallbladder    Coordination problem  Cyst of brain    states is collection of scar tissue - was kicked by a horse as a teenager   Difficult intravenous access    RIGHT CHEST PAC   Dysrhythmia    syncope,  long QT   Eating disorder    Family history of adverse reaction to anesthesia    MGM- diffulty intubating, slow to awaken.   Gallstones    GERD (gastroesophageal reflux disease)    H/O absence seizures    Head injury, intracranial, with concussion    Hearing loss    both ears   History of asthma    no current med.   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)    Migraines    Pneumonia 12/2018   hx of    PONV (postoperative nausea and vomiting)    PTSD (post-traumatic stress disorder)    Seizures (HCC)    complex partial   Shoulder dislocation 09/2018   Left   Shoulder subluxation, left    Syncope    Tremor, unspecified    bilateral arms, comes and goes    Wears glasses    Wears hearing aid    bilateral    Past Surgical History:  Procedure Laterality Date   BIOPSY  10/20/2022   Procedure: BIOPSY;  Surgeon: Eda Iha, MD;  Location: [facility];  Service: Gastroenterology;;   ORIN MEDIATE RELEASE Right 08/18/2023   Procedure: CARPAL TUNNEL RELEASE;  Surgeon: Sheril Coy, MD;  Location: [facility];  Service: Orthopedics;  Laterality: Right;   CARPAL TUNNEL RELEASE Left 10/27/2023   Procedure: LEFT CARPAL TUNNEL RELEASE;  Surgeon: Sheril Coy, MD;  Location: [facility];  Service: Orthopedics;  Laterality: Left;   CHOLECYSTECTOMY N/A 10/05/2018   Procedure: LAPAROSCOPIC CHOLECYSTECTOMY WITH INTRAOPERATIVE CHOLANGIOGRAM ERAS PATHWAY;  Surgeon: Ethyl Lenis, MD;  Location: [facility];  Service: General;  Laterality: N/A;   CYSTOSCOPY N/A 03/12/2021   Procedure: CYSTOSCOPY;  Surgeon: Delana Ted Morrison, DO;  Location: [facility];  Service: Gynecology;  Laterality: N/A;   CYSTOSCOPY W/ RETROGRADES Right 04/12/2024   Procedure: CYSTOSCOPY, WITH RETROGRADE PYELOGRAM;  Surgeon: Shane Steffan BROCKS, MD;  Location: [facility];  Service: Urology;  Laterality: Right;   CYSTOSCOPY/URETEROSCOPY/HOLMIUM LASER/STENT PLACEMENT Right 04/12/2024   Procedure: CYSTOSCOPY/URETEROSCOPY/STENT PLACEMENT;  Surgeon: Shane Steffan BROCKS, MD;  Location: [facility];  Service: Urology;  Laterality: Right;   ESOPHAGOGASTRODUODENOSCOPY (EGD) WITH PROPOFOL  N/A 10/20/2022   Procedure: ESOPHAGOGASTRODUODENOSCOPY (EGD) WITH PROPOFOL ;  Surgeon: Eda Iha, MD;  Location: [facility];  Service: Gastroenterology;  Laterality: N/A;   IR NEPHROSTOMY PLACEMENT RIGHT  04/13/2024   LAPAROSCOPIC APPENDECTOMY  11/26/2011   Procedure: APPENDECTOMY LAPAROSCOPIC;  Surgeon: Camellia CHRISTELLA Blush, MD;  Location: [facility];  Service: General;  Laterality: N/A;   left shoulder manipulation     in er multilple times last done july 2021   MASTECTOMY     bil  no reconstruction due to 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  and abnormal scans   NEPHROLITHOTOMY Right 04/14/2024   Procedure: NEPHROLITHOTOMY PERCUTANEOUS;  Surgeon: Shane Steffan BROCKS, MD;  Location: [facility];  Service: Urology;  Laterality: Right;   NIPPLE SPARING MASTECTOMY Bilateral 02/25/2022   Procedure: BILATERAL NIPPLE-SPARING MASTECTOMY;  Surgeon: Aron Shoulders, MD;  Location: [facility];  Service: General;  Laterality: Bilateral;   PORT-A-CATH REMOVAL N/A 12/10/2021   Procedure: REMOVAL PORT-A-CATH AND  REPLACEMENT;  Surgeon: Dasie Leonor CROME, MD;  Location: [facility] OR;  Service: General;  Laterality: N/A;   PORTACATH PLACEMENT Right 08/28/2017   Procedure: POWER PORT PLACEMENT;  Surgeon: Ethyl Lenis, MD;  Location: [facility];  Service: General;  Laterality: Right;   PORTACATH PLACEMENT N/A 12/04/2017   Procedure: INSERTION PORT-A-CATH;  Surgeon: Ethyl Lenis, MD;  Location: [facility] OR;  Service: General;  Laterality: N/A;   RADIOLOGY WITH ANESTHESIA Left 10/25/2019   Procedure: MRI  LEFT SHOLDER WITHOUT CONTRAST;  Surgeon: Radiologist, Medication, MD;  Location: [facility];  Service: Radiology;  Laterality: Left;   RADIOLOGY WITH ANESTHESIA Bilateral 11/19/2021   Procedure: MRI WITH ANESTHESIA OF BILATERAL BREASTS WITH AND WITHOUT CONTRAST;  Surgeon: Radiologist, Medication, MD;  Location: [facility];  Service: Radiology;  Laterality: Bilateral;   RADIOLOGY WITH ANESTHESIA N/A 02/24/2023   Procedure: MRI LEFT SHOULDER WITHOUT CONTRAST WITH ANESTHESIA;  Surgeon: Radiologist, Medication, MD;  Location: [facility];  Service: Radiology;  Laterality: N/A;   RADIOLOGY WITH ANESTHESIA Bilateral 02/25/2024   Procedure: BREAST BILATERAL MRI WITH AND WITHOUT CONTRASTWITH ANESTHESIA;  Surgeon: Radiologist, Medication, MD;  Location: [facility];  Service: Radiology;  Laterality: Bilateral;   RADIOLOGY WITH ANESTHESIA Right 04/13/2024   Procedure: RADIOLOGY WITH ANESTHESIA;  Surgeon: Johann Sieving, MD;  Location: [facility];  Service: Radiology;  Laterality: Right;  RIGHT  NEPHROSTOMY TUBE PLACEMENT   REPAIR ANKLE LIGAMENT Bilateral    x 3   TOTAL LAPAROSCOPIC HYSTERECTOMY WITH SALPINGECTOMY Bilateral 03/12/2021   Procedure: TOTAL LAPAROSCOPIC HYSTERECTOMY WITH SALPINGECTOMY;  Surgeon: Delana Ted Morrison, DO;  Location: [facility];  Service: Gynecology;  Laterality: Bilateral;    Review of Systems:    All systems reviewed and negative except where noted in HPI.    Physical Exam:  LMP 02/11/2021  Patient's last menstrual period was 02/11/2021.  General: Well-nourished, well-developed in no acute distress.  Lungs: Clear to auscultation bilaterally. Non-labored. Heart: Regular rate and rhythm, no murmurs rubs or gallops.  Abdomen: Bowel sounds are normal; Abdomen is Soft; No hepatosplenomegaly, masses or hernias;  No Abdominal Tenderness; No guarding or rebound tenderness. Neuro: Alert and oriented x 3.  Grossly intact.  Psych: Alert and cooperative, normal mood and affect.   Imaging Studies: CUP PACEART REMOTE DEVICE CHECK Result Date: 07/07/2024 ILR summary report received. Battery status OK. Normal device function. No new symptom, tachy, brady, or pause episodes. No new AF episodes. Monthly summary reports and ROV/PRN LA, CVRS   Labs: CBC    Component Value Date/Time   WBC 10.5 04/15/2024 0258   RBC 2.99 (L) 04/15/2024 0258   HGB 8.9 (L) 04/15/2024 0258   HCT 28.4 (L) 04/15/2024 0258   PLT 252 04/15/2024 0258   MCV 95.0 04/15/2024 0258   MCH 29.8 04/15/2024 0258   MCHC 31.3 04/15/2024 0258   RDW 13.2 04/15/2024 0258   RDW 13.8 06/29/2013 1437   LYMPHSABS 1.6 10/13/2023 1132   LYMPHSABS 1.8 06/29/2013 1437   MONOABS 0.4 10/13/2023 1132   EOSABS 0.3 10/13/2023 1132   EOSABS 0.1 06/29/2013 1437   BASOSABS 0.0 10/13/2023 1132   BASOSABS 0.0 06/29/2013 1437    CMP     Component Value Date/Time   NA 139 04/15/2024 0258   NA 137 06/29/2013 1437   K 4.0 04/15/2024 0258   CL 110 04/15/2024 0258   CO2 24 04/15/2024 0258   GLUCOSE 141 (H)  04/15/2024 0258   BUN 20 04/15/2024 0258   BUN 17 06/29/2013 1437  CREATININE 0.77 04/15/2024 0258   CREATININE 0.71 02/13/2023 1510   CALCIUM 7.9 (L) 04/15/2024 0258   PROT 6.1 (L) 03/25/2024 1323   PROT 7.3 06/29/2013 1437   ALBUMIN  4.3 03/25/2024 1323   ALBUMIN  4.8 06/29/2013 1437   AST 19 03/25/2024 1323   ALT 15 03/25/2024 1323   ALKPHOS 42 03/25/2024 1323   BILITOT 0.3 03/25/2024 1323   GFRNONAA >60 04/15/2024 0258   GFRAA >60 10/25/2019 0639     Assessment and Plan:   Yarlin Breisch is a 33 y.o. y/o female presents for evaluation of:  Anemia - Labs: CBC, iron panel, ferritin, B12, folate - If IDA, then schedule Colonoscopy  2.  Chronic nausea and vomiting; Mild GERD / Gastritis on EGD 10/2022; Bx Neg for H. Pylori, Celiac and EOE. - Lab: CMP - Rx PPI  3.  History of cyclic vomiting syndrome and eating disorder   4.  Chronic constipation - Lab: TSH - Rx Linzess  5.  Chronic Abdominal Pain: 03/2024 Abd Pelvic CT unremarkable except 5mm kidney stone. - Treat Constipation  6. Comorbidities    Ellouise Console, PA-C  Follow up ***

## 2024-07-22 ENCOUNTER — Encounter (HOSPITAL_COMMUNITY): Payer: Self-pay | Admitting: Cardiology

## 2024-07-22 ENCOUNTER — Ambulatory Visit: Admitting: Physician Assistant

## 2024-07-27 ENCOUNTER — Encounter: Payer: Self-pay | Admitting: *Deleted

## 2024-07-28 ENCOUNTER — Ambulatory Visit: Payer: Self-pay | Admitting: Internal Medicine

## 2024-08-08 ENCOUNTER — Ambulatory Visit (INDEPENDENT_AMBULATORY_CARE_PROVIDER_SITE_OTHER): Payer: Self-pay

## 2024-08-08 DIAGNOSIS — R55 Syncope and collapse: Secondary | ICD-10-CM

## 2024-08-09 LAB — CUP PACEART REMOTE DEVICE CHECK
Date Time Interrogation Session: 20250823232538
Implantable Pulse Generator Implant Date: 20231117

## 2024-08-10 ENCOUNTER — Ambulatory Visit: Payer: Self-pay | Admitting: Internal Medicine

## 2024-08-19 ENCOUNTER — Ambulatory Visit (INDEPENDENT_AMBULATORY_CARE_PROVIDER_SITE_OTHER): Admitting: Neurology

## 2024-08-19 ENCOUNTER — Encounter: Payer: Self-pay | Admitting: Neurology

## 2024-08-19 DIAGNOSIS — G40019 Localization-related (focal) (partial) idiopathic epilepsy and epileptic syndromes with seizures of localized onset, intractable, without status epilepticus: Secondary | ICD-10-CM

## 2024-08-19 MED ORDER — XCOPRI 14 X 12.5 MG & 14 X 25 MG PO TBPK: ORAL_TABLET | ORAL | 0 refills | Status: DC

## 2024-08-19 MED ORDER — LORAZEPAM 0.5 MG PO TABS: ORAL_TABLET | ORAL | 5 refills | Status: AC

## 2024-08-19 MED ORDER — LACOSAMIDE 100 MG PO TABS: ORAL_TABLET | ORAL | 3 refills | Status: DC

## 2024-08-19 MED ORDER — XCOPRI 14 X 50 MG & 14 X100 MG PO TBPK: ORAL_TABLET | ORAL | 0 refills | Status: DC

## 2024-08-19 MED ORDER — XCOPRI 100 MG PO TABS: ORAL_TABLET | ORAL | 5 refills | Status: DC

## 2024-08-19 MED ORDER — PERAMPANEL 8 MG PO TABS: 8.0000 mg | ORAL_TABLET | Freq: Every day | ORAL | 3 refills | Status: DC

## 2024-08-19 MED ORDER — TOPIRAMATE 100 MG PO TABS: ORAL_TABLET | ORAL | 3 refills | Status: DC
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
## 2024-08-19 NOTE — Patient Instructions (Signed)
 Good to see you, congratulations!!  Schedule MRI brain with and without contrast  2. Start Xcopri  starter pack 12.5mg  every night for 2 weeks, then 25mg  every night for 2 weeks, then 50mg  every night for 2 weeks, then 100mg  and continue  3. Update me about the Vimpat , we will plan to gradually reduce  4. Continue all your medications  5. Follow-up in 3 months, call for any changes  Seizure Precautions: 1. If medication has been prescribed for you to prevent seizures, take it exactly as directed.  Do not stop taking the medicine without talking to your doctor first, even if you have not had a seizure in a long time.   2. Avoid activities in which a seizure would cause danger to yourself or to others.  Don't operate dangerous machinery, swim alone, or climb in high or dangerous places, such as on ladders, roofs, or girders.  Do not drive unless your doctor says you may.  3. If you have any warning that you may have a seizure, lay down in a safe place where you can't hurt yourself.    4.  No driving for 6 months from last seizure, as per [place]  state law.   Please refer to the following link on the Epilepsy Foundation of America's website for more information: http://www.epilepsyfoundation.org/answerplace/Social/driving/drivingu.cfm   5.  Maintain good sleep hygiene. Avoid alcohol.  6.  Contact your doctor if you have any problems that may be related to the medicine you are taking.  7.  Call 911 and bring the patient back to the ED if:        A.  The seizure lasts longer than 5 minutes.       B.  The patient doesn't awaken shortly after the seizure  C.  The patient has new problems such as difficulty seeing, speaking or moving  D.  The patient was injured during the seizure  E.  The patient has a temperature over 102 F (39C)  F.  The patient vomited and now is having trouble breathing

## 2024-08-31 ENCOUNTER — Encounter: Payer: Self-pay | Admitting: Neurology

## 2024-08-31 NOTE — Progress Notes (Signed)
 Remote Loop Recorder Transmission

## 2024-09-03 ENCOUNTER — Other Ambulatory Visit: Payer: Self-pay | Admitting: Adult Health

## 2024-09-06 ENCOUNTER — Encounter: Payer: Self-pay | Admitting: Neurology

## 2024-09-07 ENCOUNTER — Ambulatory Visit: Admitting: Gastroenterology

## 2024-09-08 ENCOUNTER — Ambulatory Visit (INDEPENDENT_AMBULATORY_CARE_PROVIDER_SITE_OTHER): Payer: Self-pay

## 2024-09-08 DIAGNOSIS — R001 Bradycardia, unspecified: Secondary | ICD-10-CM

## 2024-09-08 LAB — CUP PACEART REMOTE DEVICE CHECK
Date Time Interrogation Session: 20250924232434
Implantable Pulse Generator Implant Date: 20231117

## 2024-09-11 ENCOUNTER — Ambulatory Visit: Payer: Self-pay | Admitting: Internal Medicine

## 2024-09-12 NOTE — Progress Notes (Signed)
 Remote Loop Recorder Transmission

## 2024-09-15 ENCOUNTER — Inpatient Hospital Stay: Admission: RE | Admit: 2024-09-15 | Source: Ambulatory Visit

## 2024-09-15 NOTE — Progress Notes (Signed)
 Remote Loop Recorder Transmission

## 2024-09-23 ENCOUNTER — Other Ambulatory Visit: Payer: Self-pay | Admitting: Neurology

## 2024-09-26 DIAGNOSIS — N390 Urinary tract infection, site not specified: Secondary | ICD-10-CM | POA: Diagnosis not present

## 2024-09-26 DIAGNOSIS — N200 Calculus of kidney: Secondary | ICD-10-CM | POA: Diagnosis not present

## 2024-09-26 DIAGNOSIS — R109 Unspecified abdominal pain: Secondary | ICD-10-CM | POA: Diagnosis not present

## 2024-09-26 DIAGNOSIS — R319 Hematuria, unspecified: Secondary | ICD-10-CM | POA: Diagnosis not present

## 2024-09-26 DIAGNOSIS — N209 Urinary calculus, unspecified: Secondary | ICD-10-CM | POA: Diagnosis not present

## 2024-10-10 ENCOUNTER — Ambulatory Visit: Payer: Self-pay

## 2024-10-10 DIAGNOSIS — R55 Syncope and collapse: Secondary | ICD-10-CM

## 2024-10-10 LAB — CUP PACEART REMOTE DEVICE CHECK
Date Time Interrogation Session: 20251026232605
Implantable Pulse Generator Implant Date: 20231117

## 2024-10-13 ENCOUNTER — Ambulatory Visit: Payer: Self-pay | Admitting: Internal Medicine

## 2024-10-13 DIAGNOSIS — F411 Generalized anxiety disorder: Secondary | ICD-10-CM | POA: Diagnosis not present

## 2024-10-13 DIAGNOSIS — F4312 Post-traumatic stress disorder, chronic: Secondary | ICD-10-CM | POA: Diagnosis not present

## 2024-10-13 DIAGNOSIS — F410 Panic disorder [episodic paroxysmal anxiety] without agoraphobia: Secondary | ICD-10-CM | POA: Diagnosis not present

## 2024-10-13 NOTE — Progress Notes (Signed)
 Remote Loop Recorder Transmission

## 2024-10-17 ENCOUNTER — Other Ambulatory Visit

## 2024-10-26 ENCOUNTER — Telehealth (INDEPENDENT_AMBULATORY_CARE_PROVIDER_SITE_OTHER): Admitting: Adult Health

## 2024-10-26 DIAGNOSIS — J0140 Acute pansinusitis, unspecified: Secondary | ICD-10-CM

## 2024-10-26 MED ORDER — AZITHROMYCIN 250 MG PO TABS: ORAL_TABLET | ORAL | 0 refills | Status: AC

## 2024-10-26 NOTE — Progress Notes (Signed)
 Virtual Visit via Video Note  I connected with [name] on 10/26/24 at  8:30 AM EST by a video enabled telemedicine application and verified that I am speaking with the correct person using two identifiers.  Location patient: home Location provider:work or home office Persons participating in the virtual visit: patient, provider  I discussed the limitations of evaluation and management by telemedicine and the availability of in person appointments. The patient expressed understanding and agreed to proceed.   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  She is being evaluated today for an acute issue. She reports for the last 8 days she has had a low grade fever and chills, sinus pain and pressure ( below her eyes and across her forehead(, rhinorrhea with discolored drainage and feeling acutely ill. At home she has been using Mucinex without improvement. She did take an at home covid test which was negative. She is now working with [employer].    ROS: See pertinent positives and negatives per HPI.  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   Gallstones    GERD (gastroesophageal reflux disease)    H/O absence seizures    Head injury, intracranial, with concussion    Hearing loss    both ears   History of asthma    no current med.   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)    Migraines    Pneumonia 12/2018   hx of    PONV (postoperative nausea and vomiting)    PTSD (post-traumatic stress disorder)    Seizures (HCC)    complex partial   Shoulder dislocation 09/2018   Left   Shoulder subluxation, left    Syncope    Tremor, unspecified    bilateral arms, comes and goes   Wears glasses    Wears hearing aid    bilateral    Past Surgical History:  Procedure Laterality Date   BIOPSY  10/20/2022   Procedure: BIOPSY;  Surgeon: Eda Iha, MD;  Location: [facility];  Service: Gastroenterology;;   ORIN MEDIATE RELEASE Right 08/18/2023   Procedure: CARPAL TUNNEL RELEASE;  Surgeon: Sheril Coy, MD;  Location: [facility];  Service: Orthopedics;  Laterality: Right;   CARPAL TUNNEL RELEASE Left 10/27/2023   Procedure: LEFT CARPAL TUNNEL RELEASE;  Surgeon: Sheril Coy, MD;  Location: [facility];  Service: Orthopedics;  Laterality: Left;   CHOLECYSTECTOMY N/A 10/05/2018   Procedure: LAPAROSCOPIC CHOLECYSTECTOMY WITH INTRAOPERATIVE CHOLANGIOGRAM ERAS PATHWAY;  Surgeon: Ethyl Lenis,  MD;  Location: [facility];  Service: General;  Laterality: N/A;   CYSTOSCOPY N/A 03/12/2021   Procedure: CYSTOSCOPY;  Surgeon: Delana Ted Morrison, DO;  Location: [facility];  Service: Gynecology;  Laterality: N/A;   CYSTOSCOPY W/ RETROGRADES Right 04/12/2024   Procedure: CYSTOSCOPY, WITH RETROGRADE PYELOGRAM;  Surgeon: Shane Steffan BROCKS, MD;  Location: [facility];  Service: Urology;  Laterality: Right;   CYSTOSCOPY/URETEROSCOPY/HOLMIUM LASER/STENT PLACEMENT Right 04/12/2024   Procedure: CYSTOSCOPY/URETEROSCOPY/STENT PLACEMENT;  Surgeon: Shane Steffan BROCKS, MD;  Location: [facility];  Service: Urology;  Laterality: Right;   ESOPHAGOGASTRODUODENOSCOPY (EGD) WITH PROPOFOL  N/A 10/20/2022   Procedure: ESOPHAGOGASTRODUODENOSCOPY (EGD) WITH PROPOFOL ;  Surgeon: Eda Iha, MD;  Location: [facility];  Service: Gastroenterology;  Laterality: N/A;   IR NEPHROSTOMY PLACEMENT RIGHT  04/13/2024   LAPAROSCOPIC APPENDECTOMY  11/26/2011   Procedure: APPENDECTOMY LAPAROSCOPIC;  Surgeon: Camellia CHRISTELLA Blush, MD;  [facility];  Service: General;  Laterality: N/A;   left shoulder manipulation     in er multilple times last done july 2021   MASTECTOMY     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   NEPHROLITHOTOMY Right 04/14/2024   Procedure: NEPHROLITHOTOMY PERCUTANEOUS;  Surgeon: Shane Steffan BROCKS, MD;  Location: [facility];  Service: Urology;  Laterality: Right;   NIPPLE SPARING MASTECTOMY Bilateral 02/25/2022   Procedure: BILATERAL NIPPLE-SPARING MASTECTOMY;  Surgeon: Aron Shoulders, MD;  Location: [facility];  Service: General;  Laterality: Bilateral;   PORT-A-CATH REMOVAL N/A 12/10/2021   Procedure: REMOVAL PORT-A-CATH AND REPLACEMENT;  Surgeon: Dasie Leonor CROME, MD;  Location: [facility];  Service: General;  Laterality: N/A;   PORTACATH PLACEMENT Right 08/28/2017   Procedure: POWER PORT PLACEMENT;  Surgeon: Ethyl Lenis, MD;  Location: [facility];  Service: General;  Laterality: Right;    PORTACATH PLACEMENT N/A 12/04/2017   Procedure: INSERTION PORT-A-CATH;  Surgeon: Ethyl Lenis, MD;  Location: [facility] OR;  Service: General;  Laterality: N/A;   RADIOLOGY WITH ANESTHESIA Left 10/25/2019   Procedure: MRI  LEFT SHOLDER WITHOUT CONTRAST;  Surgeon: Radiologist, Medication, MD;  Location: [facility];  Service: Radiology;  Laterality: Left;   RADIOLOGY WITH ANESTHESIA Bilateral 11/19/2021   Procedure: MRI WITH ANESTHESIA OF BILATERAL BREASTS WITH AND WITHOUT CONTRAST;  Surgeon: Radiologist, Medication, MD;  Location[facility];  Service: Radiology;  Laterality: Bilateral;   RADIOLOGY WITH ANESTHESIA N/A 02/24/2023   Procedure: MRI LEFT SHOULDER WITHOUT CONTRAST WITH ANESTHESIA;  Surgeon: Radiologist, Medication, MD;  Location: [facility];  Service: Radiology;  Laterality: N/A;   RADIOLOGY WITH ANESTHESIA Bilateral 02/25/2024   Procedure: BREAST BILATERAL MRI WITH AND WITHOUT CONTRASTWITH ANESTHESIA;  Surgeon: Radiologist, Medication, MD;  Location: [facility];  Service: Radiology;  Laterality: Bilateral;   RADIOLOGY WITH ANESTHESIA Right 04/13/2024   Procedure: RADIOLOGY WITH ANESTHESIA;  Surgeon: Johann Sieving, MD;  Location: [facility];  Service: Radiology;  Laterality: Right;  RIGHT NEPHROSTOMY TUBE PLACEMENT   REPAIR ANKLE LIGAMENT Bilateral  x 3   TOTAL LAPAROSCOPIC HYSTERECTOMY WITH SALPINGECTOMY Bilateral 03/12/2021   Procedure: TOTAL LAPAROSCOPIC HYSTERECTOMY WITH SALPINGECTOMY;  Surgeon: Delana Ted Morrison, DO;  Location: [facility];  Service: Gynecology;  Laterality: Bilateral;    Family History  Problem Relation Age of Onset   Polymyalgia rheumatica Mother    Heart disease Mother    Sudden Cardiac Death Father        long QT syndrome    Heart disease Maternal Grandmother    Breast cancer Maternal Grandmother    Stomach cancer Maternal Grandmother    Heart disease Maternal Grandfather    Stroke Maternal Grandfather    Diabetes Maternal Grandfather    COPD Maternal Grandfather    Kidney  failure Maternal Grandfather    Colon cancer Paternal Grandmother 40   Heart disease Paternal Grandmother    Heart disease Paternal Grandfather    Migraines Paternal Grandfather    Esophageal cancer Cousin    Pancreatic cancer Neg Hx        Current Outpatient Medications:    acetaminophen  (TYLENOL ) 325 MG tablet, Take 2 tablets (650 mg total) by mouth every 6 (six) hours as needed (pain)., Disp: , Rfl:    albuterol  (VENTOLIN  HFA) 108 (90 Base) MCG/ACT inhaler, Inhale 2 puffs into the lungs every 6 (six) hours as needed for wheezing or shortness of breath., Disp: 6.7 g, Rfl: 3   buPROPion  (WELLBUTRIN  XL) 300 MG 24 hr tablet, Take 300 mg by mouth daily., Disp: , Rfl:    busPIRone  (BUSPAR ) 15 MG tablet, Take 15 mg by mouth 2 (two) times daily., Disp: , Rfl:    Cenobamate  (XCOPRI ) 100 MG TABS, After finishing 50/100mg  starter pack, take 100mg  every night, Disp: 30 tablet, Rfl: 5   Cenobamate  (XCOPRI ) 14 x 12.5 MG & 14 x 25 MG TBPK, Start first starter pack as instructed, Disp: 1 each, Rfl: 0   Cenobamate  (XCOPRI ) 14 x 50 MG & 14 x100 MG TBPK, After finishing 12.5/25mg  starter pack, start 50mg  starter pack, Disp: 1 each, Rfl: 0   Continuous Blood Gluc Sensor (FREESTYLE LIBRE 2 SENSOR) MISC, USE WITH LIBRE APP, Disp: 6 each, Rfl: 2   Continuous Blood Gluc Transmit (DEXCOM G6 TRANSMITTER) MISC, Use with dexcom receiver and sensor, Disp: 1 each, Rfl: 3   fluticasone  furoate-vilanterol (BREO ELLIPTA ) 100-25 MCG/ACT AEPB, Inhale 1 puff into the lungs daily., Disp: 1 each, Rfl: 11   Glucagon , rDNA, (GLUCAGON  EMERGENCY) 1 MG KIT, Use PRN for hypoglycemia (Patient not taking: Reported on 08/19/2024), Disp: 1 kit, Rfl: 3   glucose monitoring kit (FREESTYLE) monitoring kit, 1 each by Does not apply route as needed for other. Free style libre on right arm, Disp: , Rfl:    hyoscyamine  (ANASPAZ ) 0.125 MG TBDP disintergrating tablet, Place 1 tablet (0.125 mg total) under the tongue every 6 (six) hours as needed  for up to 20 doses., Disp: 20 tablet, Rfl: 0   ibuprofen  (ADVIL ) 800 MG tablet, Take 1 tablet (800 mg total) by mouth every 8 (eight) hours as needed., Disp: 21 tablet, Rfl: 0   Lacosamide  (VIMPAT ) 100 MG TABS, Take 1 tablet in AM, 2 tablets in PM, Disp: 270 tablet, Rfl: 3   LORazepam  (ATIVAN ) 0.5 MG tablet, Take 1 tablet as needed for seizure. Do not take more than 2-3 a week., Disp: 10 tablet, Rfl: 5   montelukast  (SINGULAIR ) 10 MG tablet, TAKE 1 TABLET BY MOUTH EVERYDAY AT BEDTIME, Disp: 90 tablet, Rfl: 3   MOTEGRITY  2 MG  TABS, TAKE 1 TABLET BY MOUTH EVERY DAY, Disp: 30 tablet, Rfl: 5   ondansetron  (ZOFRAN -ODT) 4 MG disintegrating tablet, Take 4 mg by mouth every 8 (eight) hours., Disp: , Rfl:    perampanel  (FYCOMPA ) 8 MG tablet, Take 1 tablet (8 mg total) by mouth at bedtime. Take 1 tablet every night, Disp: 90 tablet, Rfl: 3   phenazopyridine  (PYRIDIUM ) 200 MG tablet, Take 1 tablet (200 mg total) by mouth 3 (three) times daily as needed for up to 6 doses. (Patient not taking: Reported on 08/19/2024), Disp: 6 tablet, Rfl: 0   potassium chloride  (KLOR-CON ) 10 MEQ tablet, Take 1 tablet (10 mEq total) by mouth daily for 7 days. (Patient not taking: Reported on 08/19/2024), Disp: 7 tablet, Rfl: 0   tamsulosin  (FLOMAX ) 0.4 MG CAPS capsule, Take 1 capsule (0.4 mg total) by mouth daily after breakfast. (Patient not taking: Reported on 04/18/2024), Disp: 5 capsule, Rfl: 0   tamsulosin  (FLOMAX ) 0.4 MG CAPS capsule, Take 1 capsule (0.4 mg total) by mouth daily after supper., Disp: 30 capsule, Rfl: 0   topiramate  (TOPAMAX ) 100 MG tablet, TAKE 1 TABLET BY MOUTH TWICE A DAY, Disp: 180 tablet, Rfl: 3  EXAM:  VITALS per patient if applicable:  GENERAL: alert, oriented, appears acutely ill.   HEENT: atraumatic, conjunttiva clear, no obvious abnormalities on inspection of external nose and ears  NECK: normal movements of the head and neck  LUNGS: on inspection no signs of respiratory distress, breathing rate  appears normal, no obvious gross SOB, gasping or wheezing  CV: no obvious cyanosis  MS: moves all visible extremities without noticeable abnormality  PSYCH/NEURO: pleasant and cooperative, no obvious depression or anxiety, speech and thought processing grossly intact  ASSESSMENT AND PLAN:  Discussed the following assessment and plan:  1. Acute non-recurrent pansinusitis (Primary) - Will treat with Z pack which has worked well for her in the past due to duration and symptoms  - Follow up if not improving in the next 3-4 days or sooner if fever above 101 - azithromycin  (ZITHROMAX ) 250 MG tablet; Take 2 tablets on day 1, then 1 tablet daily on days 2 through 5  Dispense: 6 tablet; Refill: 0      I discussed the assessment and treatment plan with the patient. The patient was provided an opportunity to ask questions and all were answered. The patient agreed with the plan and demonstrated an understanding of the instructions.   The patient was advised to call back or seek an in-person evaluation if the symptoms worsen or if the condition fails to improve as anticipated.   Adrienne Trombetta, NP

## 2024-10-31 ENCOUNTER — Ambulatory Visit: Admitting: Gastroenterology

## 2024-10-31 NOTE — Progress Notes (Deleted)
 SABRA

## 2024-11-10 ENCOUNTER — Ambulatory Visit: Attending: Internal Medicine

## 2024-11-10 DIAGNOSIS — R55 Syncope and collapse: Secondary | ICD-10-CM | POA: Diagnosis not present

## 2024-11-11 ENCOUNTER — Telehealth: Admitting: Family Medicine

## 2024-11-11 DIAGNOSIS — J029 Acute pharyngitis, unspecified: Secondary | ICD-10-CM | POA: Diagnosis not present

## 2024-11-11 LAB — CUP PACEART REMOTE DEVICE CHECK
Date Time Interrogation Session: 20251126232241
Implantable Pulse Generator Implant Date: 20231117

## 2024-11-11 MED ORDER — AZITHROMYCIN 250 MG PO TABS: ORAL_TABLET | ORAL | 0 refills | Status: AC

## 2024-11-11 NOTE — Progress Notes (Signed)
 Virtual Visit Consent   [name], you are scheduled for a virtual visit with a [facility] provider today. Just as with appointments in the office, your consent must be obtained to participate. Your consent will be active for this visit and any virtual visit you may have with one of our providers in the next 365 days. If you have a MyChart account, a copy of this consent can be sent to you electronically.  As this is a virtual visit, video technology does not allow for your provider to perform a traditional examination. This may limit your provider's ability to fully assess your condition. If your provider identifies any concerns that need to be evaluated in person or the need to arrange testing (such as labs, EKG, etc.), we will make arrangements to do so. Although advances in technology are sophisticated, we cannot ensure that it will always work on either your end or our end. If the connection with a video visit is poor, the visit may have to be switched to a telephone visit. With either a video or telephone visit, we are not always able to ensure that we have a secure connection.  By engaging in this virtual visit, you consent to the provision of healthcare and authorize for your insurance to be billed (if applicable) for the services provided during this visit. Depending on your insurance coverage, you may receive a charge related to this service.  I need to obtain your verbal consent now. Are you willing to proceed with your visit today? [name] has provided verbal consent on 11/11/2024 for a virtual visit (video or telephone). Loa Lamp, FNP  Date: 11/11/2024 9:23 AM   Virtual Visit via Video Note   I, Loa Lamp, connected with  Draya Felker  ([id number], July 19, 1991) on 11/11/24 at  9:15 AM EST by a video-enabled telemedicine application and verified that I am speaking with the correct person using two identifiers.  Location: Patient: Virtual Visit Location Patient:  Home Provider: Virtual Visit Location Provider: Home Office   I discussed the limitations of evaluation and management by telemedicine and the availability of in person appointments. The patient expressed understanding and agreed to proceed.    History of Present Illness: [name] is a 33 y.o. who identifies as a female who was assigned female at birth, and is being seen today for persistent reoccurring sinusitis, treated several weeks ago by pcp. Zpack. Pt would like a repeat of this. Due to allergies and intolerances they usually repeat zpack. 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   BRCA2 gene mutation positive in female 03/08/2022   S/P bilateral mastectomy 02/28/2022   Breast cancer (HCC) 02/25/2022   Port-A-Cath in place 12/03/2021   Dysmenorrhea 03/12/2021   Problem with vascular access 02/16/2020   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   Pneumomediastinum (HCC) 03/17/2019   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   Syncope 05/21/2018   Cellulitis of chest wall 10/07/2017   Infected venous access port, initial encounter 10/07/2017   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   Partial idiopathic epilepsy with seizures of localized onset, intractable, without status epilepticus (HCC) 07/12/2015   Hypoglycemia 06/12/2014   Migraine headache 06/29/2013   Asthma 11/27/2011   Muscle weakness  of lower extremity 11/26/2011   Acute appendicitis with localized peritonitis 11/26/2011   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 No Did it involve sudden or severe rash/hives, skin peeling, or any reaction on the inside of your mouth or nose? Yes Did you need to seek medical attention at a hospital or doctor's office? No When did it last happen?      4-5 years If all above answers are NO, may proceed with cephalosporin use.    Ciprofloxacin  Rash    Rash and vomiting.   Shellfish Allergy Hives and Rash   Grass Pollen(K-O-R-T-Swt Vern) Hives   Morphine  And Codeine  Nausea And Vomiting    Severe n/v   Iodine  Rash   Silicone Rash   Tegaderm Ag Mesh [Silver] Rash   Vancomycin  Hives, Itching and Rash   Medications:  Current Outpatient Medications:    azithromycin  (ZITHROMAX ) 250 MG tablet, Take 2 tablets on day 1, then 1 tablet daily on days 2 through 5, Disp: 6 tablet, Rfl: 0   acetaminophen  (TYLENOL ) 325 MG tablet, Take 2 tablets (650 mg total) by mouth every 6 (six) hours as needed (pain)., Disp: , Rfl:    albuterol  (VENTOLIN  HFA) 108 (90 Base) MCG/ACT inhaler, Inhale 2 puffs into the lungs every 6 (six) hours as needed for wheezing or shortness of breath., Disp: 6.7 g, Rfl: 3   buPROPion  (WELLBUTRIN  XL) 300 MG 24 hr tablet, Take 300 mg by mouth daily., Disp: , Rfl:    busPIRone  (BUSPAR ) 15 MG tablet, Take 15 mg by mouth 2 (two) times daily., Disp: , Rfl:    Cenobamate  (XCOPRI ) 100 MG TABS, After finishing 50/100mg  starter pack, take 100mg  every night, Disp: 30 tablet, Rfl: 5   Cenobamate  (XCOPRI ) 14 x 12.5 MG & 14 x 25 MG TBPK, Start first starter pack as instructed, Disp: 1 each, Rfl: 0   Cenobamate  (XCOPRI ) 14 x 50 MG & 14 x100 MG TBPK, After finishing 12.5/25mg  starter pack, start 50mg  starter pack, Disp: 1 each, Rfl: 0   Continuous  Blood Gluc Sensor (FREESTYLE LIBRE 2 SENSOR) MISC, USE WITH LIBRE APP, Disp: 6 each, Rfl: 2   Continuous Blood Gluc Transmit (DEXCOM G6 TRANSMITTER) MISC, Use with dexcom receiver and sensor, Disp: 1 each, Rfl: 3   fluticasone  furoate-vilanterol (BREO ELLIPTA ) 100-25 MCG/ACT AEPB, Inhale 1 puff into the lungs daily., Disp: 1 each, Rfl: 11   glucose monitoring kit (FREESTYLE) monitoring kit, 1 each by Does not apply route as needed for other. Free style libre on right arm, Disp: , Rfl:    hyoscyamine  (ANASPAZ ) 0.125 MG TBDP disintergrating tablet, Place 1 tablet (0.125 mg total) under the tongue every 6 (six) hours as needed for up to 20 doses., Disp: 20 tablet, Rfl: 0   ibuprofen  (ADVIL ) 800 MG tablet, Take 1 tablet (800 mg total) by mouth every 8 (eight) hours as needed., Disp: 21 tablet, Rfl: 0   Lacosamide  (VIMPAT ) 100 MG TABS, Take 1 tablet in AM, 2 tablets in PM, Disp: 270 tablet, Rfl:  3   LORazepam  (ATIVAN ) 0.5 MG tablet, Take 1 tablet as needed for seizure. Do not take more than 2-3 a week., Disp: 10 tablet, Rfl: 5   montelukast  (SINGULAIR ) 10 MG tablet, TAKE 1 TABLET BY MOUTH EVERYDAY AT BEDTIME, Disp: 90 tablet, Rfl: 3   MOTEGRITY  2 MG TABS, TAKE 1 TABLET BY MOUTH EVERY DAY, Disp: 30 tablet, Rfl: 5   ondansetron  (ZOFRAN -ODT) 4 MG disintegrating tablet, Take 4 mg by mouth every 8 (eight) hours., Disp: , Rfl:    perampanel  (FYCOMPA ) 8 MG tablet, Take 1 tablet (8 mg total) by mouth at bedtime. Take 1 tablet every night, Disp: 90 tablet, Rfl: 3   topiramate  (TOPAMAX ) 100 MG tablet, TAKE 1 TABLET BY MOUTH TWICE A DAY, Disp: 180 tablet, Rfl: 3  Observations/Objective: Patient is well-developed, well-nourished in no acute distress.  Resting comfortably  at home.  Head is normocephalic, atraumatic.  No labored breathing.  Speech is clear and coherent with logical content.  Patient is alert and oriented at baseline.    Assessment and Plan: 1. Acute pharyngitis, unspecified etiology  (Primary)  Increase fluids, humidifier at night, tylenol  or ibuprofen , follow up with pcp if sx persist or worsen.   Follow Up Instructions: I discussed the assessment and treatment plan with the patient. The patient was provided an opportunity to ask questions and all were answered. The patient agreed with the plan and demonstrated an understanding of the instructions.  A copy of instructions were sent to the patient via MyChart unless otherwise noted below.     The patient was advised to call back or seek an in-person evaluation if the symptoms worsen or if the condition fails to improve as anticipated.    Jenipher Havel, FNP

## 2024-11-11 NOTE — Patient Instructions (Signed)

## 2024-11-16 NOTE — Progress Notes (Signed)
 Remote Loop Recorder Transmission

## 2024-11-17 ENCOUNTER — Ambulatory Visit: Payer: Self-pay | Admitting: Internal Medicine

## 2024-11-21 ENCOUNTER — Inpatient Hospital Stay: Admission: RE | Admit: 2024-11-21 | Source: Ambulatory Visit

## 2024-11-28 ENCOUNTER — Encounter: Payer: Self-pay | Admitting: Neurology

## 2024-11-28 ENCOUNTER — Ambulatory Visit: Admitting: Neurology

## 2024-12-02 ENCOUNTER — Telehealth: Admitting: Physician Assistant

## 2024-12-02 DIAGNOSIS — J020 Streptococcal pharyngitis: Secondary | ICD-10-CM

## 2024-12-02 MED ORDER — AZITHROMYCIN 250 MG PO TABS: ORAL_TABLET | ORAL | 0 refills | Status: AC

## 2024-12-02 NOTE — Patient Instructions (Signed)
 " [name], thank you for joining [name], PA-C for today's virtual visit.  While this provider is not your primary care provider (PCP), if your PCP is located in our provider database this encounter information will be shared with them immediately following your visit.   A [facility] MyChart account gives you access to today's visit and all your visits, tests, and labs performed at [facility]  click here if you don't have a [facility] MyChart account or go to [email or web address]  Consent: (Patient) [name] provided verbal consent for this virtual visit at the beginning of the encounter.  Current Medications:  Current Outpatient Medications:    azithromycin  (ZITHROMAX ) 250 MG tablet, Take 2 tablets on day 1, then 1 tablet daily on days 2 through 5, Disp: 6 tablet, Rfl: 0   acetaminophen  (TYLENOL ) 325 MG tablet, Take 2 tablets (650 mg total) by mouth every 6 (six) hours as needed (pain)., Disp: , Rfl:    albuterol  (VENTOLIN  HFA) 108 (90 Base) MCG/ACT inhaler, Inhale 2 puffs into the lungs every 6 (six) hours as needed for wheezing or shortness of breath., Disp: 6.7 g, Rfl: 3   buPROPion  (WELLBUTRIN  XL) 300 MG 24 hr tablet, Take 300 mg by mouth daily., Disp: , Rfl:    busPIRone  (BUSPAR ) 15 MG tablet, Take 15 mg by mouth 2 (two) times daily., Disp: , Rfl:    Cenobamate  (XCOPRI ) 100 MG TABS, After finishing 50/100mg  starter pack, take 100mg  every night, Disp: 30 tablet, Rfl: 5   Cenobamate  (XCOPRI ) 14 x 12.5 MG & 14 x 25 MG TBPK, Start first starter pack as instructed, Disp: 1 each, Rfl: 0   Cenobamate  (XCOPRI ) 14 x 50 MG & 14 x100 MG TBPK, After finishing 12.5/25mg  starter pack, start 50mg  starter pack, Disp: 1 each, Rfl: 0   Continuous Blood Gluc Sensor (FREESTYLE LIBRE 2 SENSOR) MISC, USE WITH LIBRE APP, Disp: 6 each, Rfl: 2   Continuous Blood Gluc Transmit (DEXCOM G6 TRANSMITTER) MISC, Use with dexcom receiver and sensor, Disp: 1 each, Rfl: 3    fluticasone  furoate-vilanterol (BREO ELLIPTA ) 100-25 MCG/ACT AEPB, Inhale 1 puff into the lungs daily., Disp: 1 each, Rfl: 11   glucose monitoring kit (FREESTYLE) monitoring kit, 1 each by Does not apply route as needed for other. Free style libre on right arm, Disp: , Rfl:    hyoscyamine  (ANASPAZ ) 0.125 MG TBDP disintergrating tablet, Place 1 tablet (0.125 mg total) under the tongue every 6 (six) hours as needed for up to 20 doses., Disp: 20 tablet, Rfl: 0   ibuprofen  (ADVIL ) 800 MG tablet, Take 1 tablet (800 mg total) by mouth every 8 (eight) hours as needed., Disp: 21 tablet, Rfl: 0   Lacosamide  (VIMPAT ) 100 MG TABS, Take 1 tablet in AM, 2 tablets in PM, Disp: 270 tablet, Rfl: 3   LORazepam  (ATIVAN ) 0.5 MG tablet, Take 1 tablet as needed for seizure. Do not take more than 2-3 a week., Disp: 10 tablet, Rfl: 5   montelukast  (SINGULAIR ) 10 MG tablet, TAKE 1 TABLET BY MOUTH EVERYDAY AT BEDTIME, Disp: 90 tablet, Rfl: 3   MOTEGRITY  2 MG TABS, TAKE 1 TABLET BY MOUTH EVERY DAY, Disp: 30 tablet, Rfl: 5   ondansetron  (ZOFRAN -ODT) 4 MG disintegrating tablet, Take 4 mg by mouth every 8 (eight) hours., Disp: , Rfl:    perampanel  (FYCOMPA ) 8 MG tablet, Take 1 tablet (8 mg total) by mouth at bedtime. Take 1 tablet every night, Disp: 90 tablet, Rfl: 3  topiramate  (TOPAMAX ) 100 MG tablet, TAKE 1 TABLET BY MOUTH TWICE A DAY, Disp: 180 tablet, Rfl: 3   Medications ordered in this encounter:  Meds ordered this encounter  Medications   azithromycin  (ZITHROMAX ) 250 MG tablet    Sig: Take 2 tablets on day 1, then 1 tablet daily on days 2 through 5    Dispense:  6 tablet    Refill:  0    Supervising Provider:   LAMPTEY, PHILIP O [phone]     *If you need refills on other medications prior to your next appointment, please contact your pharmacy*  Follow-Up: Call back or seek an in-person evaluation if the symptoms worsen or if the condition fails to improve as anticipated.  [facility] [phone]  Other Instructions Strep Throat, Adult Strep throat is an infection in the throat that is caused by bacteria. It is common during the cold months of the year. It mostly affects children who are 20-68 years old. However, people of all ages can get it at any time of the year. This infection spreads from person to person (is contagious) through coughing, sneezing, or having close contact. Your health care provider may use other names to describe the infection. When strep throat affects the tonsils, it is called tonsillitis. When it affects the back of the throat, it is called pharyngitis. What are the causes? This condition is caused by the Streptococcus pyogenes bacteria. What increases the risk? You are more likely to develop this condition if: You care for school-age children, or are around school-age children. Children are more likely to get strep throat and may spread it to others. You spend time in crowded places where the infection can spread easily. You have close contact with someone who has strep throat. What are the signs or symptoms? Symptoms of this condition include: Fever or chills. Redness, swelling, or pain in the tonsils or throat. Pain or difficulty when swallowing. White or yellow spots on the tonsils or throat. Tender glands in the neck and under the jaw. Bad smelling breath. Red rash all over the body. This is rare. How is this diagnosed? This condition is diagnosed by tests that check for the presence and the amount of bacteria that cause strep throat. They are: Rapid strep test. Your throat is swabbed and checked for the presence of bacteria. Results are usually ready in minutes. Throat culture test. Your throat is swabbed. The sample is placed in a cup that allows infections to grow. Results are usually ready in 1 or 2 days. How is this treated? This condition may be treated with: Medicines that kill germs (antibiotics). Medicines that relieve pain or  fever. These include: Ibuprofen  or acetaminophen . Aspirin, only for people who are over the age of 23. Throat lozenges. Throat sprays. Follow these instructions at home: Medicines  Take over-the-counter and prescription medicines only as told by your health care provider. Take your antibiotic medicine as told by your health care provider. Do not stop taking the antibiotic even if you start to feel better. Eating and drinking  If you have trouble swallowing, try eating soft foods until your sore throat feels better. Drink enough fluid to keep your urine pale yellow. To help relieve pain, you may have: Warm fluids, such as soup and tea. Cold fluids, such as frozen desserts or popsicles. General instructions Gargle with a salt-water  mixture 3-4 times a day or as needed. To make a salt-water  mixture, completely dissolve -1 tsp (3-6 g) of  salt in 1 cup (237 mL) of warm water . Get plenty of rest. Stay home from work or school until you have been taking antibiotics for 24 hours. Do not use any products that contain nicotine or tobacco. These products include cigarettes, chewing tobacco, and vaping devices, such as e-cigarettes. If you need help quitting, ask your health care provider. It is up to you to get your test results. Ask your health care provider, or the department that is doing the test, when your results will be ready. Keep all follow-up visits. This is important. How is this prevented?  Do not share food, drinking cups, or personal items that could cause the infection to spread to other people. Wash your hands often with soap and water  for at least 20 seconds. If soap and water  are not available, use hand sanitizer. Make sure that all people in your house wash their hands well. Have family members tested if they have a sore throat or fever. They may need an antibiotic if they have strep throat. Contact a health care provider if: You have swelling in your neck that keeps getting  bigger. You develop a rash, cough, or earache. You cough up a thick mucus that is green, yellow-brown, or bloody. You have pain or discomfort that does not get better with medicine. Your symptoms seem to be getting worse. You have a fever. Get help right away if: You have new symptoms, such as vomiting, severe headache, stiff or painful neck, chest pain, or shortness of breath. You have severe throat pain, drooling, or changes in your voice. You have swelling of the neck, or the skin on the neck becomes red and tender. You have signs of dehydration, such as tiredness (fatigue), dry mouth, and decreased urination. You become increasingly sleepy, or you cannot wake up completely. Your joints become red or painful. These symptoms may represent a serious problem that is an emergency. Do not wait to see if the symptoms will go away. Get medical help right away. Call your local emergency services (911 in the [place] Do not drive yourself to the hospital. Summary Strep throat is an infection in the throat that is caused by the Streptococcus pyogenes bacteria. This infection is spread from person to person (is contagious) through coughing, sneezing, or having close contact. Take your medicines, including antibiotics, as told by your health care provider. Do not stop taking the antibiotic even if you start to feel better. To prevent the spread of germs, wash your hands well with soap and water . Have others do the same. Do not share food, drinking cups, or personal items. Get help right away if you have new symptoms, such as vomiting, severe headache, stiff or painful neck, chest pain, or shortness of breath. This information is not intended to replace advice given to you by your health care provider. Make sure you discuss any questions you have with your health care provider. Document Revised: 03/26/2021 Document Reviewed: 03/26/2021 Elsevier Patient Education  2024 Elsevier Inc.   If you have been  instructed to have an in-person evaluation today at a local Urgent Care facility, please use the link below. It will take you to a list of all of our available [facility], including address, phone number and hours of operation. Please do not delay care.  [facility]  If you or a family member do not have a primary care provider, use the link below to schedule a visit and establish care. When you choose  a [facility] primary care physician or advanced practice provider, you gain a long-term partner in health. Find a Primary Care Provider  Learn more about [facility]'s in-office and virtual care options: [facility] - Get Care Now "

## 2024-12-02 NOTE — Progress Notes (Signed)
 " Virtual Visit Consent   [name], you are scheduled for a virtual visit with a [facility] provider today. Just as with appointments in the office, your consent must be obtained to participate. Your consent will be active for this visit and any virtual visit you may have with one of our providers in the next 365 days. If you have a MyChart account, a copy of this consent can be sent to you electronically.  As this is a virtual visit, video technology does not allow for your provider to perform a traditional examination. This may limit your provider's ability to fully assess your condition. If your provider identifies any concerns that need to be evaluated in person or the need to arrange testing (such as labs, EKG, etc.), we will make arrangements to do so. Although advances in technology are sophisticated, we cannot ensure that it will always work on either your end or our end. If the connection with a video visit is poor, the visit may have to be switched to a telephone visit. With either a video or telephone visit, we are not always able to ensure that we have a secure connection.  By engaging in this virtual visit, you consent to the provision of healthcare and authorize for your insurance to be billed (if applicable) for the services provided during this visit. Depending on your insurance coverage, you may receive a charge related to this service.  I need to obtain your verbal consent now. Are you willing to proceed with your visit today? [name] has provided verbal consent on 12/02/2024 for a virtual visit (video or telephone). Annette [name], PA-C  Date: 12/02/2024 1:43 PM   Virtual Visit via Video Note   I, [name], connected with  [name]  ([id number], May 11, 1991) on 12/02/2024 at  1:30 PM EST by a video-enabled telemedicine application and verified that I am speaking with the correct person using two identifiers.  Location: Patient: Virtual Visit Location  Patient: Home Provider: Virtual Visit Location Provider: Home Office   I discussed the limitations of evaluation and management by telemedicine and the availability of in person appointments. The patient expressed understanding and agreed to proceed.    History of Present Illness: [name] is a 33 y.o. who identifies as a female who was assigned female at birth, and is being seen today for sore throat and fever.  HPI: Sore Throat  This is a new problem. The current episode started yesterday. The problem has been unchanged. The maximum temperature recorded prior to her arrival was 101 - 101.9 F (101.4). The fever has been present for Less than 1 day. The pain is moderate. Associated symptoms include ear pain (left), headaches, a hoarse voice, swollen glands (mild) and trouble swallowing (left). Pertinent negatives include no congestion, coughing, diarrhea, ear discharge, plugged ear sensation, shortness of breath or vomiting. Associated symptoms comments: Chills, decreased appetite. 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 She has tried NSAIDs (ibuprofen ) for the symptoms. The treatment provided mild relief.     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   Syncope 05/21/2018   Cellulitis of chest wall 10/07/2017   Infected venous access port, initial encounter 10/07/2017   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  Resting comfortably at home.  Head is normocephalic, atraumatic.  No labored breathing.  Speech is clear and coherent with logical content.  Patient is alert and oriented at baseline.    Assessment and Plan: 1. Strep pharyngitis (Primary) - azithromycin  (ZITHROMAX ) 250 MG tablet; Take 2 tablets on day 1, then 1 tablet daily on days 2 through 5  Dispense: 6 tablet; Refill: 0  - Suspect strep throat - Azithromycin  prescribed - Tylenol  and Ibuprofen  alternating every 4 hours - Salt water  gargles - Chloraseptic spray - Liquid and soft food diet - Push fluids - New toothbrush in 3 days - Seek in person evaluation if not improving or if symptoms worsen   Follow Up Instructions: I discussed the assessment and treatment plan with the patient. The patient was provided an opportunity to ask questions and all were answered. The patient agreed with the plan and demonstrated an understanding of the instructions.  A copy of instructions were sent to the patient via MyChart unless otherwise noted below.    The patient was advised to call back or seek an in-person evaluation if the symptoms worsen or if the condition fails to improve as anticipated.    Annette [name], PA-C     [1]  Allergies Allergen Reactions   Cefprozil Anaphylaxis, Rash and Shortness Of  Breath    cefprozil   Cephalosporins Anaphylaxis, Shortness Of Breath and Rash   Latex Shortness Of Breath and Rash   Penicillins Anaphylaxis, Hives, Rash and Other (See Comments)    Tongue swells, throat does not close  Did it involve swelling of the face/tongue/throat, SOB, or low BP? No Did it involve sudden or severe rash/hives, skin peeling, or any reaction on the inside of your mouth or nose? Yes Did you need to seek medical attention at a hospital or doctor's office? No When did it last happen?      4-5 years If all above answers are NO, may proceed with cephalosporin use.    Ciprofloxacin  Rash    Rash and vomiting.   Shellfish Allergy Hives and Rash   Grass Pollen(K-O-R-T-Swt Vern) Hives   Morphine  And Codeine  Nausea And Vomiting    Severe n/v   Iodine  Rash   Silicone Rash   Tegaderm Ag Mesh [Silver] Rash   Vancomycin  Hives, Itching and Rash  [2]  Current Outpatient Medications:    azithromycin  (ZITHROMAX ) 250 MG tablet, Take 2 tablets on day 1, then 1 tablet daily on days 2 through 5, Disp: 6 tablet, Rfl: 0   acetaminophen  (TYLENOL ) 325 MG tablet, Take 2 tablets (650 mg total) by mouth every 6 (six) hours as needed (pain)., Disp: ,  Rfl:    albuterol  (VENTOLIN  HFA) 108 (90 Base) MCG/ACT inhaler, Inhale 2 puffs into the lungs every 6 (six) hours as needed for wheezing or shortness of breath., Disp: 6.7 g, Rfl: 3   buPROPion  (WELLBUTRIN  XL) 300 MG 24 hr tablet, Take 300 mg by mouth daily., Disp: , Rfl:    busPIRone  (BUSPAR ) 15 MG tablet, Take 15 mg by mouth 2 (two) times daily., Disp: , Rfl:    Cenobamate  (XCOPRI ) 100 MG TABS, After finishing 50/100mg  starter pack, take 100mg  every night, Disp: 30 tablet, Rfl: 5   Cenobamate  (XCOPRI ) 14 x 12.5 MG & 14 x 25 MG TBPK, Start first starter pack as instructed, Disp: 1 each, Rfl: 0   Cenobamate  (XCOPRI ) 14 x 50 MG & 14 x100 MG TBPK, After finishing 12.5/25mg  starter pack, start 50mg  starter pack, Disp: 1 each, Rfl: 0   Continuous  Blood Gluc Sensor (FREESTYLE LIBRE 2 SENSOR) MISC, USE WITH LIBRE APP, Disp: 6 each, Rfl: 2   Continuous Blood Gluc Transmit (DEXCOM G6 TRANSMITTER) MISC, Use with dexcom receiver and sensor, Disp: 1 each, Rfl: 3   fluticasone  furoate-vilanterol (BREO ELLIPTA ) 100-25 MCG/ACT AEPB, Inhale 1 puff into the lungs daily., Disp: 1 each, Rfl: 11   glucose monitoring kit (FREESTYLE) monitoring kit, 1 each by Does not apply route as needed for other. Free style libre on right arm, Disp: , Rfl:    hyoscyamine  (ANASPAZ ) 0.125 MG TBDP disintergrating tablet, Place 1 tablet (0.125 mg total) under the tongue every 6 (six) hours as needed for up to 20 doses., Disp: 20 tablet, Rfl: 0   ibuprofen  (ADVIL ) 800 MG tablet, Take 1 tablet (800 mg total) by mouth every 8 (eight) hours as needed., Disp: 21 tablet, Rfl: 0   Lacosamide  (VIMPAT ) 100 MG TABS, Take 1 tablet in AM, 2 tablets in PM, Disp: 270 tablet, Rfl: 3   LORazepam  (ATIVAN ) 0.5 MG tablet, Take 1 tablet as needed for seizure. Do not take more than 2-3 a week., Disp: 10 tablet, Rfl: 5   montelukast  (SINGULAIR ) 10 MG tablet, TAKE 1 TABLET BY MOUTH EVERYDAY AT BEDTIME, Disp: 90 tablet, Rfl: 3   MOTEGRITY  2 MG TABS, TAKE 1 TABLET BY MOUTH EVERY DAY, Disp: 30 tablet, Rfl: 5   ondansetron  (ZOFRAN -ODT) 4 MG disintegrating tablet, Take 4 mg by mouth every 8 (eight) hours., Disp: , Rfl:    perampanel  (FYCOMPA ) 8 MG tablet, Take 1 tablet (8 mg total) by mouth at bedtime. Take 1 tablet every night, Disp: 90 tablet, Rfl: 3   topiramate  (TOPAMAX ) 100 MG tablet, TAKE 1 TABLET BY MOUTH TWICE A DAY, Disp: 180 tablet, Rfl: 3  "

## 2024-12-11 ENCOUNTER — Ambulatory Visit: Attending: Cardiology

## 2024-12-11 DIAGNOSIS — R55 Syncope and collapse: Secondary | ICD-10-CM | POA: Diagnosis not present

## 2024-12-12 LAB — CUP PACEART REMOTE DEVICE CHECK
Date Time Interrogation Session: 20251227232354
Implantable Pulse Generator Implant Date: 20231117

## 2024-12-13 ENCOUNTER — Ambulatory Visit: Payer: Self-pay | Admitting: Cardiology

## 2024-12-19 NOTE — Progress Notes (Signed)
 Remote Loop Recorder Transmission

## 2024-12-22 NOTE — Telephone Encounter (Signed)
 error

## 2024-12-27 ENCOUNTER — Ambulatory Visit: Admitting: Neurology

## 2024-12-29 ENCOUNTER — Inpatient Hospital Stay: Admission: RE | Admit: 2024-12-29 | Source: Ambulatory Visit

## 2025-01-10 ENCOUNTER — Telehealth: Payer: Self-pay

## 2025-01-10 NOTE — Telephone Encounter (Signed)
 Alert received from [employer] for 1 symptom event with SR with HR up to ~100 bpm.  Second symptom event in January.  Pt reports chest pain as symptom.

## 2025-01-10 NOTE — Telephone Encounter (Signed)
Attempted to contact patient. No answer, left message to call back

## 2025-01-11 ENCOUNTER — Ambulatory Visit: Attending: Cardiology

## 2025-01-11 DIAGNOSIS — R55 Syncope and collapse: Secondary | ICD-10-CM

## 2025-01-11 LAB — CUP PACEART REMOTE DEVICE CHECK
Date Time Interrogation Session: 20260127232414
Implantable Pulse Generator Implant Date: 20231117

## 2025-01-11 NOTE — Telephone Encounter (Signed)
 Patient reports she was sitting in class when sudden onset sharp/throbbing chest pain occurred. Denies any radiation or other associated symptoms. Lasted for approx. 5-6 minutes. Patient reports this is the third time she has experienced this pain. Denies under any recent stress of contributing factors.   Patient advised ILR report shows heart rate around 100 bpm, otherwise no abnormal findings. Patient advised if chest pain occurs again to seek ER. PT agreeble to plan.   Routing to Dr. Inocencio to update.

## 2025-01-11 NOTE — Telephone Encounter (Signed)
 Patient called to follow-up on next steps regarding incident report.

## 2025-01-11 NOTE — Telephone Encounter (Signed)
Patient called back returning call.  

## 2025-01-11 NOTE — Telephone Encounter (Signed)
 Spoke with the patient and advised that there were no further recommendations at this time and to continue to monitor.

## 2025-01-12 ENCOUNTER — Ambulatory Visit: Payer: Self-pay | Admitting: Cardiology

## 2025-01-16 ENCOUNTER — Ambulatory Visit: Admitting: Physician Assistant

## 2025-01-19 ENCOUNTER — Telehealth: Payer: Self-pay

## 2025-01-19 ENCOUNTER — Encounter: Payer: Self-pay | Admitting: Neurology

## 2025-01-19 ENCOUNTER — Ambulatory Visit: Payer: Self-pay | Admitting: Neurology

## 2025-01-19 VITALS — BP 88/68 | HR 74 | Ht 64.0 in | Wt 98.0 lb

## 2025-01-19 DIAGNOSIS — G43009 Migraine without aura, not intractable, without status migrainosus: Secondary | ICD-10-CM

## 2025-01-19 DIAGNOSIS — G40019 Localization-related (focal) (partial) idiopathic epilepsy and epileptic syndromes with seizures of localized onset, intractable, without status epilepticus: Secondary | ICD-10-CM

## 2025-01-19 MED ORDER — PERAMPANEL 8 MG PO TABS: 8.0000 mg | ORAL_TABLET | Freq: Every day | ORAL | 3 refills | Status: AC

## 2025-01-19 MED ORDER — TOPIRAMATE 100 MG PO TABS: 100.0000 mg | ORAL_TABLET | Freq: Two times a day (BID) | ORAL | 3 refills | Status: AC

## 2025-01-19 MED ORDER — XCOPRI 100 MG PO TABS: ORAL_TABLET | ORAL | 5 refills | Status: AC

## 2025-01-19 MED ORDER — SUMATRIPTAN SUCCINATE 50 MG PO TABS: ORAL_TABLET | ORAL | 11 refills | Status: AC

## 2025-01-19 MED ORDER — LACOSAMIDE 100 MG PO TABS: ORAL_TABLET | ORAL | 3 refills | Status: AC

## 2025-01-19 MED ORDER — AIMOVIG 140 MG/ML ~~LOC~~ SOAJ: 1.0000 | SUBCUTANEOUS | 11 refills | Status: AC

## 2025-01-19 NOTE — Patient Instructions (Signed)
 Good to see you.  Reduce Vimpat  100mg : take 1 tablet twice a day. Continue all your other medications  2. A prescription was sent for Aimovig  injection: take 1 every month. Rescue medication sumatriptan  also sent in to take at onset of migraine as needed  3. Continue seizure and migraine diary  4. Follow-up in 4 months, call for any changes   Seizure Precautions: 1. If medication has been prescribed for you to prevent seizures, take it exactly as directed.  Do not stop taking the medicine without talking to your doctor first, even if you have not had a seizure in a long time.   2. Avoid activities in which a seizure would cause danger to yourself or to others.  Don't operate dangerous machinery, swim alone, or climb in high or dangerous places, such as on ladders, roofs, or girders.  Do not drive unless your doctor says you may.  3. If you have any warning that you may have a seizure, lay down in a safe place where you can't hurt yourself.    4.  No driving for 6 months from last seizure, as per   state law.   Please refer to the following link on the [employer]ile[employer]'s website for more information: http://www.epilepsyfoundation.org/answerplace/Social/driving/drivingu.cfm   5.  Maintain good sleep hygiene.  6.  Contact your doctor if you have any problems that may be related to the medicine you are taking.  7.  Call 911 and bring the patient back to the ED if:        A.  The seizure lasts longer than 5 minutes.       B.  The patient doesn't awaken shortly after the seizure  C.  The patient has new problems such as difficulty seeing, speaking or moving  D.  The patient was injured during the seizure  E.  The patient has a temperature over 102 F (39C)  F.  The patient vomited and now is having trouble breathing

## 2025-01-19 NOTE — Progress Notes (Signed)
 Remote Loop Recorder Transmission

## 2025-01-19 NOTE — Telephone Encounter (Signed)
 Pt needs a PA for Aimovig 
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
## 2025-02-06 ENCOUNTER — Ambulatory Visit: Admitting: Physician Assistant

## 2025-02-11 ENCOUNTER — Ambulatory Visit

## 2025-06-05 ENCOUNTER — Telehealth: Payer: Self-pay | Admitting: Neurology
